# Patient Record
Sex: Male | Born: 1940
Health system: Southern US, Community
[De-identification: ages and names within clinical notes are randomized; demographics above are authoritative.]

## PROBLEM LIST (undated history)

## (undated) DIAGNOSIS — E785 Hyperlipidemia, unspecified: Secondary | ICD-10-CM

## (undated) DIAGNOSIS — I10 Essential (primary) hypertension: Secondary | ICD-10-CM

## (undated) HISTORY — DX: Hyperlipidemia, unspecified: E78.5

## (undated) HISTORY — DX: Essential (primary) hypertension: I10

---

## 2004-03-16 ENCOUNTER — Emergency Department: Payer: Self-pay | Admitting: Emergency Medicine

## 2005-01-11 ENCOUNTER — Ambulatory Visit: Payer: Self-pay | Admitting: Internal Medicine

## 2006-03-31 ENCOUNTER — Ambulatory Visit: Payer: Self-pay | Admitting: Internal Medicine

## 2006-07-25 ENCOUNTER — Ambulatory Visit: Payer: Self-pay | Admitting: Internal Medicine

## 2007-04-13 LAB — BASIC METABOLIC PANEL
BUN: 18 (ref 4–21)
CREATININE: 1 (ref 0.6–1.3)
Glucose: 75
Potassium: 3.6 (ref 3.4–5.3)
Sodium: 140 (ref 137–147)

## 2007-04-13 LAB — LIPID PANEL
CHOLESTEROL: 170 (ref 0–200)
HDL: 33 — AB (ref 35–70)
LDL Cholesterol: 60
TRIGLYCERIDES: 383 — AB (ref 40–160)

## 2007-04-13 LAB — PSA: PSA: 0.9

## 2007-04-13 LAB — HEMOGLOBIN A1C: HEMOGLOBIN A1C: 6.2

## 2008-04-21 HISTORY — PX: CATARACT EXTRACTION: SUR2

## 2009-12-18 LAB — LIPID PANEL
CHOLESTEROL: 150 (ref 0–200)
HDL: 31 — AB (ref 35–70)
LDL CALC: 46
TRIGLYCERIDES: 363 — AB (ref 40–160)

## 2009-12-18 LAB — BASIC METABOLIC PANEL
BUN: 19 (ref 4–21)
Creatinine: 0.9 (ref 0.6–1.3)
Glucose: 112
Potassium: 4 (ref 3.4–5.3)
Sodium: 142 (ref 137–147)

## 2009-12-18 LAB — HEMOGLOBIN A1C: HEMOGLOBIN A1C: 6.9

## 2009-12-18 LAB — PSA: PSA: 1.1

## 2011-03-08 HISTORY — PX: CARDIAC CATHETERIZATION: SHX172

## 2011-10-31 ENCOUNTER — Ambulatory Visit: Payer: Self-pay | Admitting: Vascular Surgery

## 2011-10-31 LAB — BASIC METABOLIC PANEL
BUN: 28 mg/dL — ABNORMAL HIGH (ref 7–18)
Creatinine: 1.05 mg/dL (ref 0.60–1.30)
EGFR (African American): >60
Glucose: 143 mg/dL — ABNORMAL HIGH (ref 65–99)
Osmolality: 291 (ref 275–301)
Sodium: 142 mmol/L (ref 136–145)

## 2011-10-31 IMAGING — XA IR VASCULAR PROCEDURE
9 series · 15 of 18 positions shown · IV contrast (IODINE)
Comparison: none

[Series 1: aortagram · 2 of 2 slices shown (1 of 9)]
[im 1/2]
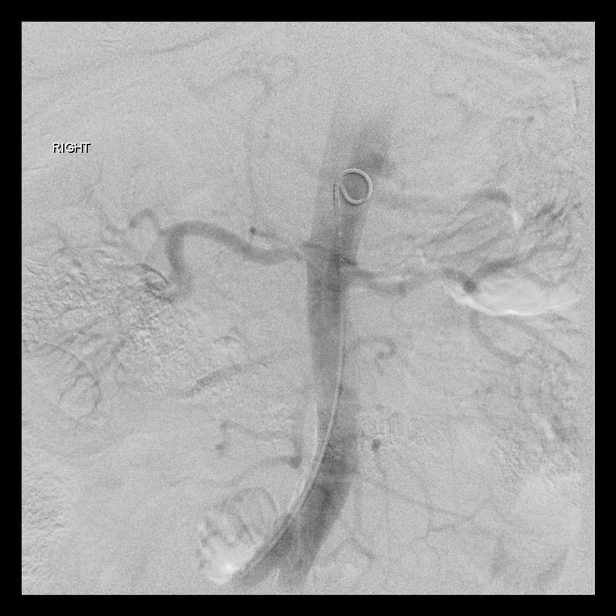
[im 2/2]
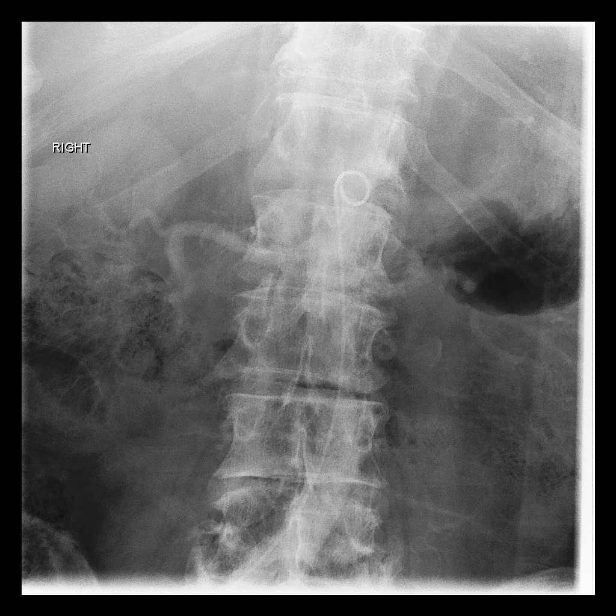

[Series 2: aortagram · 1 of 2 slices shown (2 of 9)]
[im 2/2]
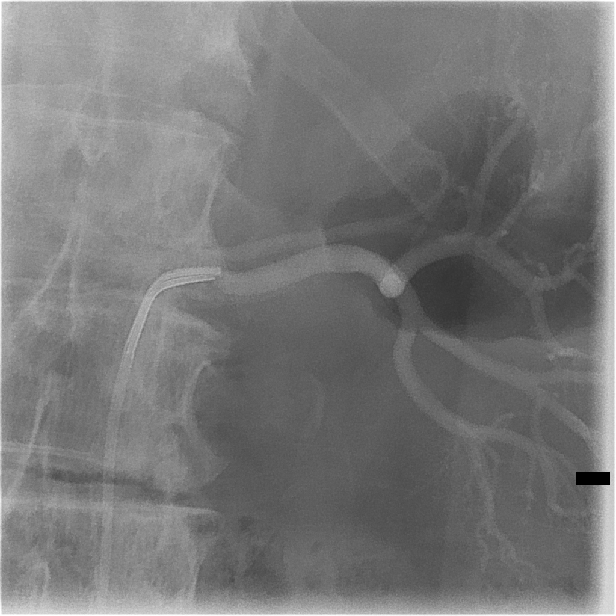

[Series 3: aortagram · 2 of 2 slices shown (3 of 9)]
[im 1/2]
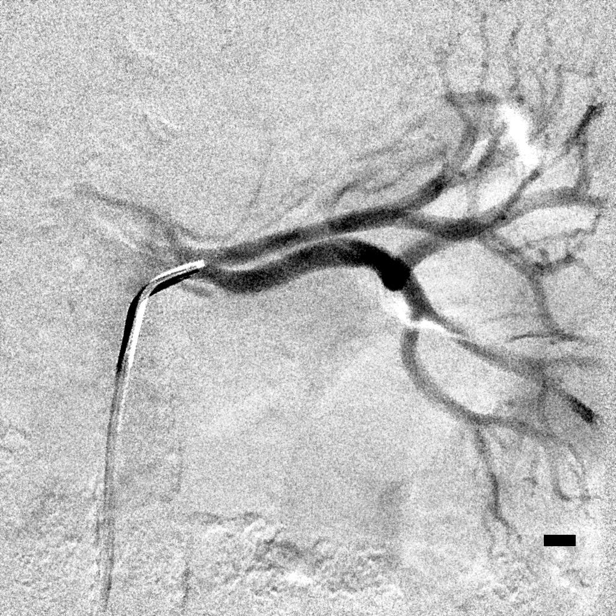
[im 2/2]
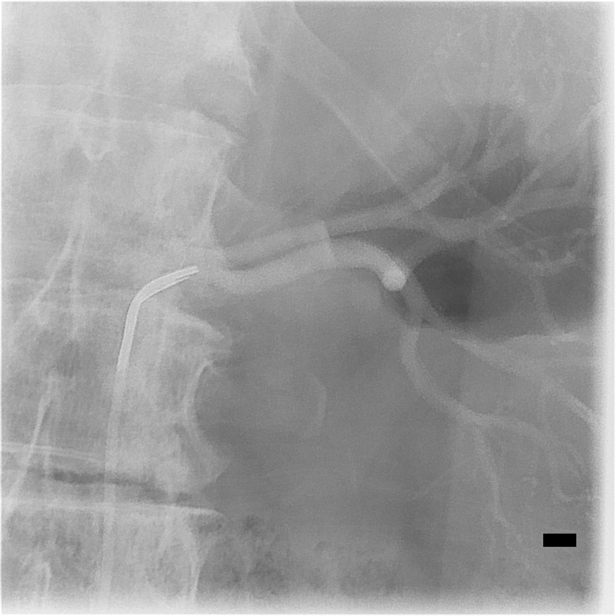

[Series 4: aortagram · 2 of 2 slices shown (4 of 9)]
[im 1/2]
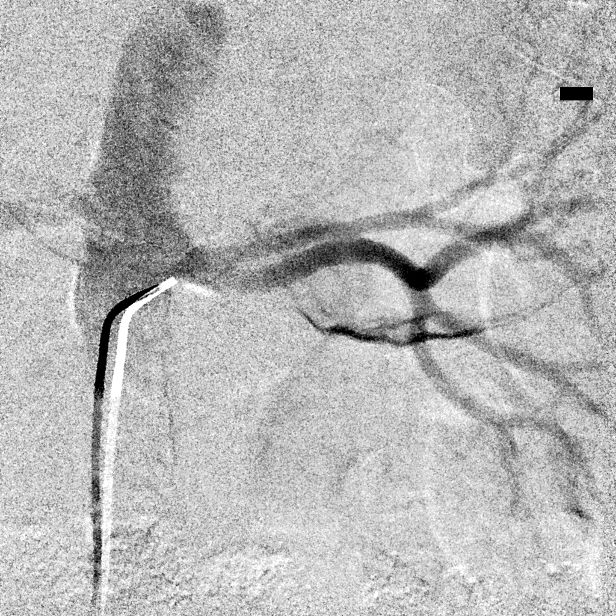
[im 2/2]
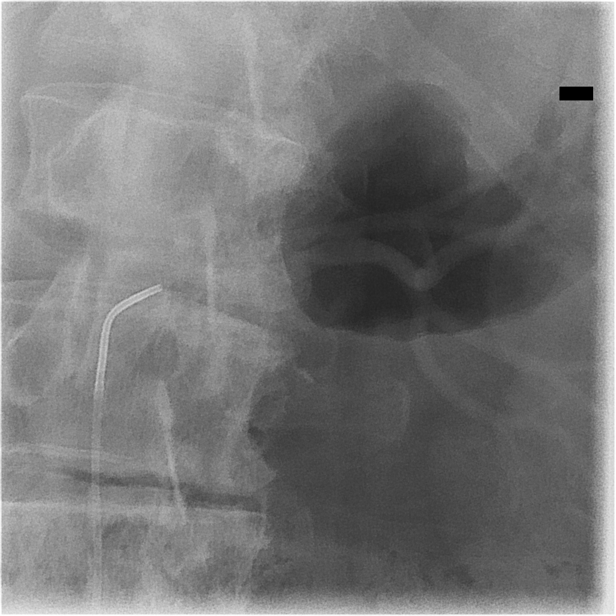

[Series 5: aortagram · 1 of 2 slices shown (5 of 9)]
[im 2/2]
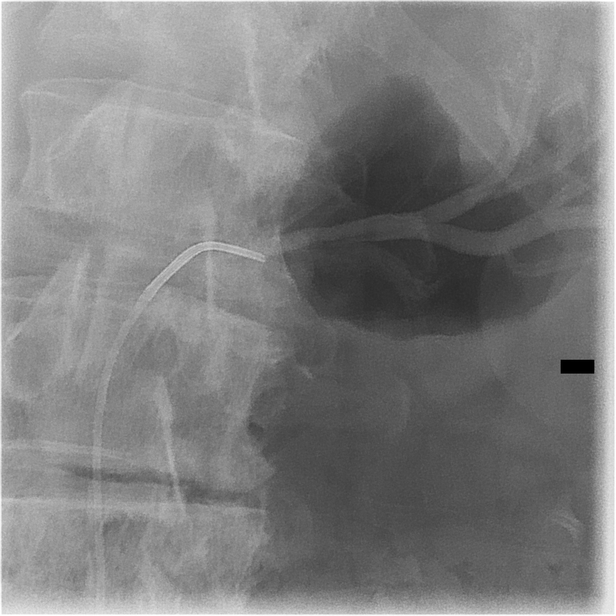

[Series 6: aortagram · 2 of 2 slices shown (6 of 9)]
[im 1/2]
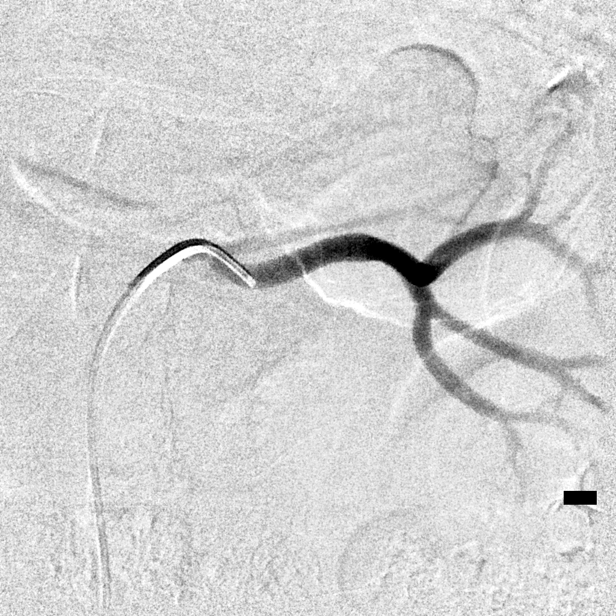
[im 2/2]
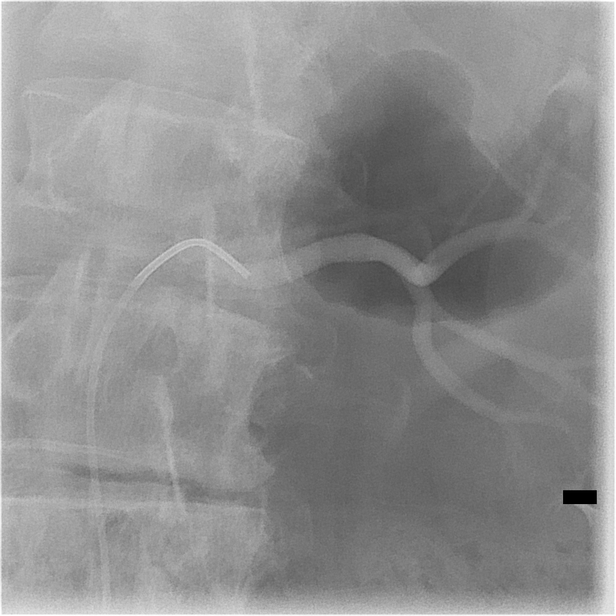

[Series 7: aortagram · 2 of 2 slices shown (7 of 9)]
[im 1/2]
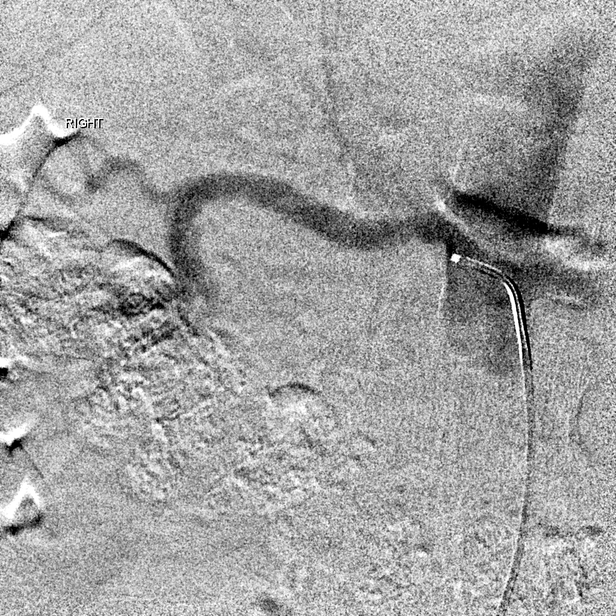
[im 2/2]
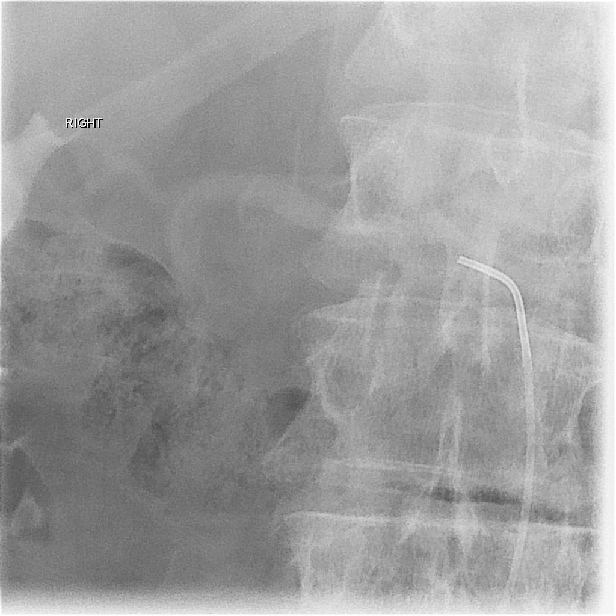

[Series 8: aortagram · 1 of 2 slices shown (8 of 9)]
[im 2/2]
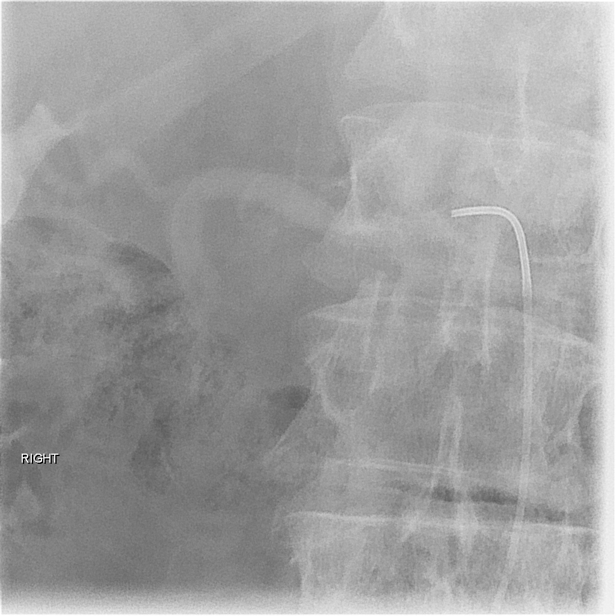

[Series 10: aortagram · 2 of 2 slices shown (9 of 9)]
[im 1/2]
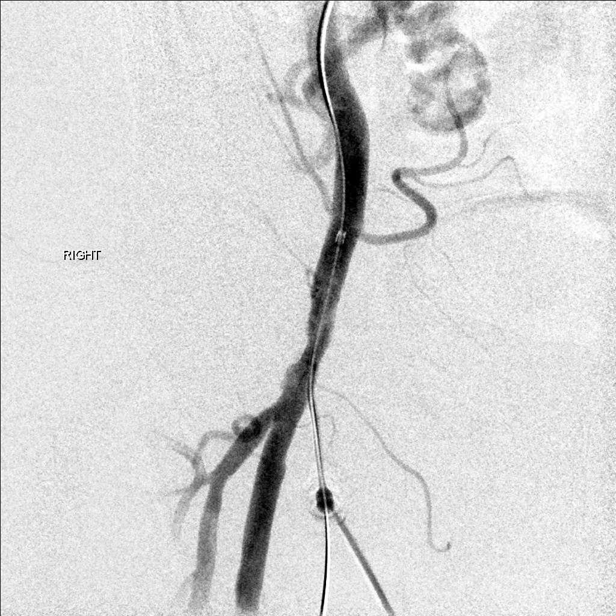
[im 2/2]
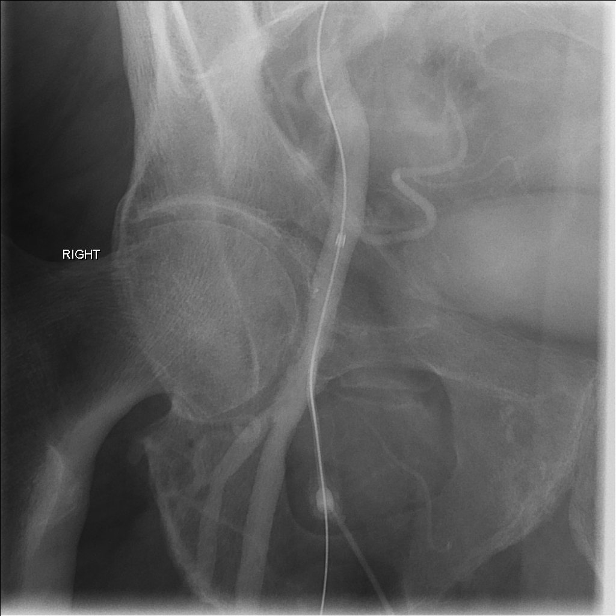

[15 of 18 positions shown; findings below may reference images not displayed]

IMAGES IMPORTED FROM THE SYNGO WORKFLOW SYSTEM
NO DICTATION FOR STUDY

## 2014-06-11 LAB — BASIC METABOLIC PANEL
BUN: 21 (ref 4–21)
CREATININE: 0.9 (ref 0.6–1.3)
GLUCOSE: 178
Potassium: 4.2 (ref 3.4–5.3)
Sodium: 142 (ref 137–147)

## 2014-06-11 LAB — LIPID PANEL
CHOLESTEROL: 174 (ref 0–200)
HDL: 27 — AB (ref 35–70)
LDL CALC: 84
TRIGLYCERIDES: 314 — AB (ref 40–160)

## 2014-06-11 LAB — TSH: TSH: 4.27 (ref 0.41–5.90)

## 2014-06-11 LAB — HEMOGLOBIN A1C: HEMOGLOBIN A1C: 7.1

## 2014-06-24 NOTE — Op Note (Signed)
PATIENT NAME:  Robert Daniel, Robert Daniel MR#:  [id number] DATE OF BIRTH:  September 26, 1940  DATE OF PROCEDURE:  10/31/2011  PREOPERATIVE DIAGNOSES:  1. Severe hypertension.  2. Renal artery stenosis.  3. Diabetes.  POSTOPERATIVE DIAGNOSES:  1. Severe hypertension.  2. Renal artery stenosis.  3. Diabetes.   PROCEDURES:   1. Ultrasound guidance for vascular access, right femoral artery.  2. Catheter placement into bilateral renal arteries from right femoral approach.  3. Aortogram and selective renal angiogram.  4. Pressure measurements of left renal artery.  5. StarClose closure device, right femoral artery.   SURGEON: Algernon Huxley, M.D.   ANESTHESIA: Local with moderate conscious sedation.   ESTIMATED BLOOD LOSS: 25 mL. CONTRAST USED: 50 mL Visipaque.  FLUOROSCOPY TIME: Approximately three minutes.   INDICATION FOR PROCEDURE: This is a 74 year old white male with very poorly controlled hypertension, on multiple medications. A noninvasive study did not suggest high-grade stenosis, but some degree of renal artery stenosis was suspected and in a discussion with the patient we decided to perform an angiogram for further evaluation and potential treatment if high-grade stenosis was seen. The risks and benefits were discussed. Informed consent was obtained.   DESCRIPTION OF PROCEDURE: The patient was brought to the vascular interventional radiology suite. Groins were shaved and prepped and a sterile surgical field was created. The right femoral head was localized with fluoroscopy. Due to poor anatomic landmarks, ultrasound was used to access the right femoral artery. This was done under direct ultrasound guidance without difficulty with a Seldinger needle. A J-wire and 5 French sheath were placed. Pigtail catheter was placed in the aorta at the L1 level and AP aortogram was performed and this showed the origins of the renal vessels. C2 catheter was used to cannulate both renal arteries on both the  selective imaging and the aortogram. No high grade renal artery stenosis was suggested. The right renal artery had a very mild stenosis that appeared to be less than 20%. The left renal artery had a very early bifurcation and had some degree of renal artery stenosis in the larger of the two renal arteries. This appeared to be in the 30% to 40% range. To further evaluate this, when I selectively cannulated the left renal artery after imaging it, pressure measurements were performed from beyond the stenosis in the main left renal artery back into the aorta and no gradient of significance was seen. After we had evaluated both renal arteries, I elected to terminate the procedure. The diagnostic catheter was removed. Oblique arteriogram was performed in the right femoral artery and StarClose closure device was deployed in the usual fashion with a good hemostatic result. The patient had developed some hematoma around the sheath likely due to his severe hypertension and size during the onset of the procedure, but this had not enlarged. Several minutes of extra pressure were held after closure device deployment. The patient tolerated the procedure well and was taken to the recovery room in stable condition.   ____________________________ Algernon Huxley, MD jsd:ap D: 10/31/2011 11:03:08 ET T: 10/31/2011 12:15:49 ET JOB#: [id number]  cc: Algernon Huxley, MD, <Dictator> Mikeal Hawthorne. Brynda Greathouse, MD Algernon Huxley MD ELECTRONICALLY SIGNED 11/09/2011 10:00

## 2015-05-12 DIAGNOSIS — I119 Hypertensive heart disease without heart failure: Secondary | ICD-10-CM | POA: Diagnosis not present

## 2015-05-12 DIAGNOSIS — E119 Type 2 diabetes mellitus without complications: Secondary | ICD-10-CM | POA: Diagnosis not present

## 2015-05-12 DIAGNOSIS — E785 Hyperlipidemia, unspecified: Secondary | ICD-10-CM | POA: Diagnosis not present

## 2015-10-28 DIAGNOSIS — R69 Illness, unspecified: Secondary | ICD-10-CM | POA: Diagnosis not present

## 2015-11-10 DIAGNOSIS — I119 Hypertensive heart disease without heart failure: Secondary | ICD-10-CM | POA: Diagnosis not present

## 2015-11-10 DIAGNOSIS — E119 Type 2 diabetes mellitus without complications: Secondary | ICD-10-CM | POA: Diagnosis not present

## 2015-12-02 DIAGNOSIS — E113393 Type 2 diabetes mellitus with moderate nonproliferative diabetic retinopathy without macular edema, bilateral: Secondary | ICD-10-CM | POA: Diagnosis not present

## 2016-04-05 DIAGNOSIS — E785 Hyperlipidemia, unspecified: Secondary | ICD-10-CM | POA: Diagnosis not present

## 2016-04-05 DIAGNOSIS — I119 Hypertensive heart disease without heart failure: Secondary | ICD-10-CM | POA: Diagnosis not present

## 2016-04-05 DIAGNOSIS — E119 Type 2 diabetes mellitus without complications: Secondary | ICD-10-CM | POA: Diagnosis not present

## 2016-04-05 LAB — LIPID PANEL
CHOLESTEROL: 218 — AB (ref 0–200)
HDL: 33 — AB (ref 35–70)
LDL Cholesterol: 134
Triglycerides: 255 — AB (ref 40–160)

## 2016-04-05 LAB — BASIC METABOLIC PANEL
BUN: 27 — AB (ref 4–21)
CREATININE: 1.1 (ref 0.6–1.3)
Glucose: 176
POTASSIUM: 4.2 (ref 3.4–5.3)
Sodium: 143 (ref 137–147)

## 2016-04-05 LAB — HEMOGLOBIN A1C: HEMOGLOBIN A1C: 7

## 2016-08-23 DIAGNOSIS — Z794 Long term (current) use of insulin: Secondary | ICD-10-CM | POA: Diagnosis not present

## 2016-08-23 DIAGNOSIS — E119 Type 2 diabetes mellitus without complications: Secondary | ICD-10-CM | POA: Diagnosis not present

## 2016-08-23 DIAGNOSIS — I119 Hypertensive heart disease without heart failure: Secondary | ICD-10-CM | POA: Diagnosis not present

## 2016-08-23 LAB — BASIC METABOLIC PANEL
BUN / CREAT RATIO: 25 — AB (ref 10–24)
BUN: 27 — AB (ref 4–21)
CHLORIDE: 103
Calcium: 9.8
Carbon Dioxide, Total: 19 — AB (ref 20–29)
Creat: 1.07
GFR CALC NON AF AMER: 67
Glucose: 210
POTASSIUM: 4.2
Sodium: 140

## 2016-08-23 LAB — LIPID PANEL
Cholesterol: 199 (ref 0–200)
HDL: 31 — AB (ref 35–70)
LDL CALC: 95
Triglycerides: 367 — AB (ref 40–160)

## 2016-08-23 LAB — HEMOGLOBIN A1C: HEMOGLOBIN A1C: 7.6

## 2016-11-21 DIAGNOSIS — R69 Illness, unspecified: Secondary | ICD-10-CM | POA: Diagnosis not present

## 2016-12-06 DIAGNOSIS — E113393 Type 2 diabetes mellitus with moderate nonproliferative diabetic retinopathy without macular edema, bilateral: Secondary | ICD-10-CM | POA: Diagnosis not present

## 2016-12-06 LAB — HM DIABETES EYE EXAM

## 2016-12-07 ENCOUNTER — Encounter: Payer: Self-pay | Admitting: Family Medicine

## 2016-12-20 ENCOUNTER — Ambulatory Visit (INDEPENDENT_AMBULATORY_CARE_PROVIDER_SITE_OTHER): Payer: Medicare HMO | Admitting: Family Medicine

## 2016-12-20 ENCOUNTER — Encounter: Payer: Self-pay | Admitting: Family Medicine

## 2016-12-20 VITALS — BP 244/108 | HR 76 | Temp 98.4°F | Resp 16 | Ht 75.0 in | Wt 290.0 lb

## 2016-12-20 DIAGNOSIS — I1A0 Resistant hypertension: Secondary | ICD-10-CM | POA: Insufficient documentation

## 2016-12-20 DIAGNOSIS — Z7689 Persons encountering health services in other specified circumstances: Secondary | ICD-10-CM | POA: Diagnosis not present

## 2016-12-20 DIAGNOSIS — E119 Type 2 diabetes mellitus without complications: Secondary | ICD-10-CM | POA: Diagnosis not present

## 2016-12-20 DIAGNOSIS — I10 Essential (primary) hypertension: Secondary | ICD-10-CM

## 2016-12-20 DIAGNOSIS — E1121 Type 2 diabetes mellitus with diabetic nephropathy: Secondary | ICD-10-CM | POA: Insufficient documentation

## 2016-12-20 DIAGNOSIS — I517 Cardiomegaly: Secondary | ICD-10-CM | POA: Diagnosis not present

## 2016-12-20 NOTE — Progress Notes (Signed)
Subjective:    Patient ID: Robert Daniel, male    DOB: [birth year], 76 y.o.   MRN: [id number]  Robert Daniel is a 76 y.o. male presenting on 12/20/2016 for Establish Care (blood pressure 150/90 normal for patient as per previous PCP)  Here to establish care, previous PCP Dr Brynda Greathouse, who has retired. Last office visit was >3 months ago.  HPI   Specialists: - Dermatology - Dr Nehemiah Massed ([place]) - Chronic Rash Lower Extremity, thought to be secondary to allergy from beta blocker and/or amlodipine - Dr Lorie Apley ([place])  Previous specialists: - Vascular Surgery - Dr Leotis Pain - Cardiac Catheterization, reportedly 40% blockage but do not have rest of report - Nephrology - Dr Lavonia Dana (2013 [facility]) - had work-up for potential resistant hypertension, reportedly unremarkable testing based on PCP records with negative renal arteriogram no evidence of RAS  CHRONIC HTN / Hypertensive Urgency Reports a long history of known white coat hypertension with elevated BP at doctors office, usually it improves upon re-check, he does check it at home occasionally, and states previously goal was around 150/90 or less, he has had dramatic elevations in past as well. He is concerned about BP elevated today, and admits he is very anxious, see below - Chart review shows last visit 08/2016 with BP 220/110 and 11/2015 had BP 190/88, and further review long history of uncontrolled HTN with SBP 190-220 often, he was initiated on Hydralazine back in 2016 with dose titration  Current Meds - Benazepril 15m daily, Clonidine 0.233mTID, Hydralazine 10041mID - History of intolerance or side effects on Bystolic and Amlodipine 44m72CNlast rx 2013, has history of leg rash but seems chronic, unable to provide clear explanation, but chart review shows Amlodipne caused LE edema and discoloration  - Also failed Metoprolol, HCTZ, Diovan  Reports good compliance, took meds today.  Tolerating well, w/o complaints. Lifestyle: - Diet: tries to limit carbs for DM, low salt - Exercise: [employer], cycling, treadmill - Additional history has rx PRN Torsemide 100m22mor edema, rarely uses, takes Klor-Con with it PRN  CHRONIC DM, Type 2: Reports no concerns, last A1c reviewed from outside office >3 months ago Meds: Metformin 1000mg70m, Glimepiride 4mg x73mfor 8mg da44m Reports good compliance. Tolerating well w/o side-effects Currently on ACEi  History of HLD - on Gemfibrozil 600mg BI35md Lovastatin 20mg dai37mHealth Maintenance: - UTD Flu Shot - UTD on Pneumonia vaccines, x 2 since age >65, do n>75have full details on vaccination dose etc. Based off of prior medical records reviewed - S/p Zostavax 10/2012 - UTD TDap last 10/27/2010 - [facility] [place] of colonoscopy reportedly completed in 2017, without record available  No flowsheet data found.  Depression screen PHQ 2/9 12/20/2016  Decreased Interest 0  Down, Depressed, Hopeless 0  PHQ - 2 Score 0    Past Medical History:  Diagnosis Date  . Diabetes mellitus without complication (HCC)   . Wall Laneertension    Past Surgical History:  Procedure Laterality Date  . CARDIAC CATHETERIZATION  2013  . CATARACT EXTRACTION Bilateral 04/21/2008   R eye 05-12-08   Social History   Social History  . Marital status: Divorced    Spouse name: N/A  . Number of children: N/A  . Years of education: Graduate   Occupational History  . Not on file.   Social History Main Topics  . Smoking status: Never Smoker  . Smokeless tobacco:  Never Used  . Alcohol use No  . Drug use: No  . Sexual activity: Not on file   Other Topics Concern  . Not on file   Social History Narrative  . No narrative on file   History reviewed. No pertinent family history. No current outpatient prescriptions on file prior to visit.   No current facility-administered medications on file prior to visit.     Review of  Systems  Constitutional: Negative for activity change, appetite change, chills, diaphoresis, fatigue, fever and unexpected weight change.  HENT: Negative for congestion, hearing loss and sinus pressure.   Eyes: Negative for visual disturbance.  Respiratory: Negative for cough, chest tightness, shortness of breath and wheezing.   Cardiovascular: Negative for chest pain, palpitations and leg swelling.  Gastrointestinal: Negative for abdominal pain, anal bleeding, blood in stool, constipation, diarrhea, nausea and vomiting.  Endocrine: Negative for cold intolerance and polyuria.  Genitourinary: Negative for decreased urine volume, dysuria, frequency and hematuria.  Musculoskeletal: Negative for arthralgias, back pain and neck pain.  Skin: Negative for rash.  Allergic/Immunologic: Negative for environmental allergies.  Neurological: Negative for dizziness, weakness, light-headedness, numbness and headaches.  Hematological: Negative for adenopathy.  Psychiatric/Behavioral: Negative for behavioral problems, dysphoric mood and sleep disturbance. The patient is nervous/anxious.    Per HPI unless specifically indicated above     Objective:    BP (!) 244/108 (BP Location: Left Arm, Patient Position: Sitting, Cuff Size: Large)   Pulse 76   Temp 98.4 F (36.9 C) (Oral)   Resp 16   Ht '6\' 3"'  (1.905 m)   Wt 290 lb (131.5 kg)   BMI 36.25 kg/m   Wt Readings from Last 3 Encounters:  12/20/16 290 lb (131.5 kg)    Physical Exam  Constitutional: He is oriented to person, place, and time. He appears well-developed and well-nourished. No distress.  Well-appearing, comfortable, cooperative, obese  HENT:  Head: Normocephalic and atraumatic.  Mouth/Throat: Oropharynx is clear and moist.  Eyes: Conjunctivae are normal. Right eye exhibits no discharge. Left eye exhibits no discharge.  Neck: Normal range of motion. Neck supple. No thyromegaly present.  Cardiovascular: Normal rate, regular rhythm, normal  heart sounds and intact distal pulses.   No murmur heard. Pulmonary/Chest: Effort normal and breath sounds normal. No respiratory distress. He has no wheezes. He has no rales.  Musculoskeletal: Normal range of motion. He exhibits no edema.  Upper / Lower Extremities: - Normal muscle tone, strength bilateral upper extremities 5/5, lower extremities 5/5  Normal gait  Lymphadenopathy:    He has no cervical adenopathy.  Neurological: He is alert and oriented to person, place, and time. No cranial nerve deficit.  Distal sensation to light touch intact  Skin: Skin is warm and dry. No rash noted. He is not diaphoretic. No erythema.  Psychiatric: He has a normal mood and affect. His behavior is normal.  Well groomed, good eye contact, normal speech and thoughts  Nursing note and vitals reviewed.   EKG - performed in office today  Date: 12/20/16  Rate: 80  Rhythm: normal sinus rhythm with PVC  QRS Axis: normal  Intervals: normal  ST/T Wave abnormalities: nonspecific ST/T changes  Conduction Disutrbances:none  Additional Narrative Interpretation: Left ventricular hypertrophy  Old EKG Reviewed: unchanged - 2001, 1996, from prior PCP records  Results for orders placed or performed in visit on 48/18/59  Basic metabolic panel  Result Value Ref Range   Glucose 210    BUN 27 (A) 4 - 21  Creat 1.07    EGFR (Non-African Amer.) 67    BUN/Creatinine Ratio 25 (A) 10 - 24   Sodium 140    Potassium 4.2    Chloride 103    Carbon Dioxide, Total 19 (A) 20 - 29   Calcium 9.8   Hemoglobin A1c  Result Value Ref Range   Hemoglobin A1C 7.6   Lipid panel  Result Value Ref Range   Triglycerides 367 (A) 40 - 160   Cholesterol 199 0 - 200   HDL 31 (A) 35 - 70   LDL Cholesterol 95   Basic metabolic panel  Result Value Ref Range   Glucose 176    BUN 27 (A) 4 - 21   Creatinine 1.1 0.6 - 1.3   Potassium 4.2 3.4 - 5.3   Sodium 143 137 - 147  Lipid panel  Result Value Ref Range   Triglycerides 255  (A) 40 - 160   Cholesterol 218 (A) 0 - 200   HDL 33 (A) 35 - 70   LDL Cholesterol 134   Hemoglobin A1c  Result Value Ref Range   Hemoglobin A1C 7.0       Assessment & Plan:   Problem List Items Addressed This Visit    Controlled type 2 diabetes mellitus without complication ([place])    Well-controlled DM with A1c 7-3.5 No known complications or hypoglycemia.  Plan:  1. Continue current therapy - Metformin 1065m BID, Glimepiride 852mdaily 2. Encourage improved lifestyle - low carb, low sugar diet, reduce portion size, continue improving regular exercise 3. Check CBG, bring log to next visit for review 4. Continue ASA, ACEi, Statin 5. Due for future DM Foot exam 6. Follow-up 3 months A1c      Relevant Medications   benazepril (LOTENSIN) 20 MG tablet   glimepiride (AMARYL) 4 MG tablet   lovastatin (MEVACOR) 20 MG tablet   metFORMIN (GLUCOPHAGE) 1000 MG tablet   aspirin EC 81 MG tablet   Resistant hypertension - Primary    Asymptomatic, despite significantly uncontrolled BP. Today elevated SBP 220-240 on multiple manual and electronic re-checks over course of visit. - PCP records reviewed, with chronic history resistant HTN >20 years, with avg SBP 180-200 - Home BP readings reportedly 14329-924 No known complications - Prior work-up done by Vascular and Renal with negative secondary HTN work-up, negative RAS - Failed: Bystolic, Metoprolol, HCTZ, Diovan  Plan:  1. Continue current BP regimen today due to chronicity of problem - and various med changes over years - continue Benazepril 2056mClonidine 0.2mg67mD, Hydralazine 100mg62m 2. Encourage improved lifestyle - low sodium diet, regular exercise 3. Continue monitor BP outside office, bring readings to next visit for review 4. Discussion on future follow-up plans, patient is adamant that his BP is always elevated in doctors office, and he is asymptomatic with unremarkable exam, worse with anxiety and admits white coat HTN,  emphasized that given his elevated BP and since first visit with him, I would offer to have him be evaluated in the hospital ED for more immediate attention, labs, med management and work-up. He declines states this is "not new" and again attributes it to anxiety - Performed EKG in office, essentially unchanged from EKG 17 years ago, concern with identified LVH on prior EKGs as well - [facility] work-up of resistant HTN and pursue ECHO among other stress test as indicated, concern with LVH  Other possible secondary causes of HTN without  evidence of prior work-up, unless further in old records, could be OSA and possible endocrine etiology (record review TSH >6 few years ago), upon follow-up with discuss these options as well  Future labs ordered for 3 months with CMET, TSH, Free T4, CBC, PRA + PRC Aldosterone  Strict return precautions given to patient when to go to hospital ED if worsening symptoms      Relevant Medications   benazepril (LOTENSIN) 20 MG tablet   cloNIDine (CATAPRES) 0.2 MG tablet   gemfibrozil (LOPID) 600 MG tablet   hydrALAZINE (APRESOLINE) 100 MG tablet   lovastatin (MEVACOR) 20 MG tablet   torsemide (DEMADEX) 100 MG tablet   aspirin EC 81 MG tablet   Other Relevant Orders   Ambulatory referral to Cardiology   White coat syndrome with hypertension    See A&P HTN Concern situational anxiety is related However seems to be severe reaction with BP >200 on average in office Patient asking about starting anxiety med, and advised not comfortable new start today with this BP and without review of his complete medical record, awaiting      Relevant Medications   benazepril (LOTENSIN) 20 MG tablet   cloNIDine (CATAPRES) 0.2 MG tablet   gemfibrozil (LOPID) 600 MG tablet   hydrALAZINE (APRESOLINE) 100 MG tablet   lovastatin (MEVACOR) 20 MG tablet   torsemide (DEMADEX) 100 MG tablet   aspirin EC 81 MG tablet    Other Visit  Diagnoses    Encounter to establish care with new doctor       LVH (left ventricular hypertrophy)       Relevant Medications   benazepril (LOTENSIN) 20 MG tablet   cloNIDine (CATAPRES) 0.2 MG tablet   gemfibrozil (LOPID) 600 MG tablet   hydrALAZINE (APRESOLINE) 100 MG tablet   lovastatin (MEVACOR) 20 MG tablet   torsemide (DEMADEX) 100 MG tablet   aspirin EC 81 MG tablet   Other Relevant Orders   Ambulatory referral to Cardiology      Meds ordered this encounter  Medications  . benazepril (LOTENSIN) 20 MG tablet  . cloNIDine (CATAPRES) 0.2 MG tablet  . gemfibrozil (LOPID) 600 MG tablet  . glimepiride (AMARYL) 4 MG tablet  . hydrALAZINE (APRESOLINE) 100 MG tablet  . FLUZONE HIGH-DOSE 0.5 ML injection  . lovastatin (MEVACOR) 20 MG tablet  . metFORMIN (GLUCOPHAGE) 1000 MG tablet  . triamcinolone cream (KENALOG) 0.1 %  . torsemide (DEMADEX) 100 MG tablet    Sig: Take 100 mg by mouth as needed.  . potassium chloride (KLOR-CON) 20 MEQ packet    Sig: Take by mouth as needed.  Marland Kitchen aspirin EC 81 MG tablet    Sig: Take 81 mg by mouth daily.  . Cyanocobalamin (VITAMIN B-12) 5000 MCG TBDP    Sig: Take by mouth.  . calcium citrate-vitamin D (CALCIUM + D) 315-200 MG-UNIT tablet    Sig: Take 1 tablet by mouth once.  Marland Kitchen co-enzyme Q-10 30 MG capsule    Sig: Take 30 mg by mouth 3 (three) times daily.  . Melatonin 3-2 MG TABS    Sig: Take by mouth.  . Ascorbic Acid (VITAMIN C) 1000 MG tablet    Sig: Take 1,000 mg by mouth daily.  . Multiple Vitamin (MULTIVITAMIN) tablet    Sig: Take 1 tablet by mouth daily.    Follow up plan: Return in about 3 months (around 03/22/2017) for HTN, DM.  Nobie Putnam, [facility] 12/21/2016, 2:04 AM

## 2016-12-20 NOTE — Patient Instructions (Addendum)
Thank you for coming to the clinic today.  1. I am concerned about significantly elevated blood pressure.  I understand you are anxious but I am still concerned. I feel better that you do not have any symptoms.  Recommended EKG today and if further elevated BP at home >180 or any new symptoms chest pain, headache, difficulty breathing, or other concerns then I recommend going to the hospital Emergency Department  EKG essentially unchanged from >20 years ago  [facility]) [facility] [address] [place], [place] [address] Main: [phone]  Stay tuned for appointment from Cardiologist within 1-2 weeks, if you don't hear back, then please call them to check  We are interested to do more testing on the heart especially ECHOcardiogram or stress test given Increased Ventricular Hypertrophy (enlarged left ventricular)  DUE for NON FASTING BLOOD WORK (no food or drink after midnight before the lab appointment, only water or coffee without cream/sugar on the morning of)  SCHEDULE "Lab Only" visit in the morning at the clinic for lab draw in 3 MONTHS  - Make sure Lab Only appointment is at about 1 week before your next appointment, so that results will be available  For Lab Results, once available within 2-3 days of blood draw, you can can log in to MyChart online to view your results and a brief explanation. Also, we can discuss results at next follow-up visit.  Please schedule a Follow-up Appointment to: Return in about 3 months (around 03/22/2017) for HTN, DM.  If you have any other questions or concerns, please feel free to call the clinic or send a message through [facility]. You may also schedule an earlier appointment if necessary.  Additionally, you may be receiving a survey about your experience at our clinic within a few days to 1 week by e-mail or mail. We value your feedback.  Nobie Putnam, DO [place]

## 2016-12-21 ENCOUNTER — Encounter: Payer: Self-pay | Admitting: Family Medicine

## 2016-12-21 ENCOUNTER — Other Ambulatory Visit: Payer: Self-pay | Admitting: Family Medicine

## 2016-12-21 DIAGNOSIS — I10 Essential (primary) hypertension: Secondary | ICD-10-CM

## 2016-12-21 DIAGNOSIS — E119 Type 2 diabetes mellitus without complications: Secondary | ICD-10-CM

## 2016-12-21 DIAGNOSIS — Z125 Encounter for screening for malignant neoplasm of prostate: Secondary | ICD-10-CM

## 2016-12-21 NOTE — Assessment & Plan Note (Signed)
Well-controlled DM with A1c 1-6.0 No known complications or hypoglycemia.  Plan:  1. Continue current therapy - Metformin 1000mg  BID, Glimepiride 8mg  daily 2. Encourage improved lifestyle - low carb, low sugar diet, reduce portion size, continue improving regular exercise 3. Check CBG, bring log to next visit for review 4. Continue ASA, ACEi, Statin 5. Due for future DM Foot exam 6. Follow-up 3 months A1c

## 2016-12-21 NOTE — Addendum Note (Signed)
Addended by: Frederich Cha D on: 12/21/2016 12:04 PM   Modules accepted: Orders

## 2016-12-21 NOTE — Assessment & Plan Note (Addendum)
Asymptomatic, despite significantly uncontrolled BP. Today elevated SBP 220-240 on multiple manual and electronic re-checks over course of visit. - PCP records reviewed, with chronic history resistant HTN >20 years, with avg SBP 180-200 - Home BP readings reportedly [id number] - No known complications - Prior work-up done by Vascular and Renal with negative secondary HTN work-up, negative RAS - Failed: Bystolic, Metoprolol, HCTZ, Diovan  Plan:  1. Continue current BP regimen today due to chronicity of problem - and various med changes over years - continue Benazepril 20mg , Clonidine 0.2mg  TID, Hydralazine 100mg  BID 2. Encourage improved lifestyle - low sodium diet, regular exercise 3. Continue monitor BP outside office, bring readings to next visit for review 4. Discussion on future follow-up plans, patient is adamant that his BP is always elevated in doctors office, and he is asymptomatic with unremarkable exam, worse with anxiety and admits white coat HTN, emphasized that given his elevated BP and since first visit with him, I would offer to have him be evaluated in the hospital ED for more immediate attention, labs, med management and work-up. He declines states this is "not new" and again attributes it to anxiety - Performed EKG in office, essentially unchanged from EKG 17 years ago, concern with identified LVH on prior EKGs as well - Agree to proceed with referral to [facility] for further work-up of resistant HTN and pursue ECHO among other stress test as indicated, concern with LVH  Other possible secondary causes of HTN without evidence of prior work-up, unless further in old records, could be OSA and possible endocrine etiology (record review TSH >6 few years ago), upon follow-up with discuss these options as well  Future labs ordered for 3 months with CMET, TSH, Free T4, CBC, PRA + PRC Aldosterone  Strict return precautions given to patient when to go to hospital ED if worsening  symptoms

## 2016-12-21 NOTE — Assessment & Plan Note (Addendum)
See A&P HTN Concern situational anxiety is related However seems to be severe reaction with BP >200 on average in office Patient asking about starting anxiety med, and advised not comfortable new start today with this BP and without review of his complete medical record, awaiting

## 2016-12-22 ENCOUNTER — Ambulatory Visit: Payer: Self-pay | Admitting: Cardiovascular Disease

## 2016-12-27 ENCOUNTER — Encounter: Payer: Self-pay | Admitting: Family Medicine

## 2017-01-11 ENCOUNTER — Other Ambulatory Visit: Payer: Self-pay

## 2017-01-11 DIAGNOSIS — I10 Essential (primary) hypertension: Secondary | ICD-10-CM

## 2017-01-11 MED ORDER — BENAZEPRIL HCL 20 MG PO TABS: 20.0000 mg | ORAL_TABLET | Freq: Every day | ORAL | 1 refills | Status: DC

## 2017-02-16 DIAGNOSIS — E785 Hyperlipidemia, unspecified: Secondary | ICD-10-CM

## 2017-02-16 DIAGNOSIS — E1169 Type 2 diabetes mellitus with other specified complication: Secondary | ICD-10-CM | POA: Insufficient documentation

## 2017-02-16 HISTORY — DX: Hyperlipidemia, unspecified: E78.5

## 2017-02-16 NOTE — Progress Notes (Signed)
Cardiology Office Note  Date:  02/17/2017   ID:  Robert Daniel, DOB 02/13/1941, MRN [id number]  PCP:  Olin Hauser, DO   Chief Complaint  Patient presents with  . other    Hypertension and LVH. Meds reviewed verbally with pt.    HPI:  Robert Daniel is a pleasant 76 year old gentleman with past medical history of DM2, HBA1C 7.0 Hyperlipidemia Cardiac cath 2013 nonsmoker Labile hypertension/ white coat hypertension  Who presents by referral from Dr. Haze Justin for consultation of his hypertension  At home he reports blood pressure typically runs 782 up to 956 systolic on a regular basis Checks his blood pressure several times per day When seen in doctor's offices, systolic pressure typically in the 200s Todays no exception, blood pressure on arrival 220/100 On my recheck blood pressure 190/110 left arm This was also confirmed on his automatic blood pressure cuff which he brings with him today  Reports compliance with his medications,  takes hydralazine 100 mg 6 AM and 6 PM Was clonidine 0.2 mg 3 times daily 6 AM, 6 PM, midnight Benazepril 20 mg in the morning  Other medical team includes Vascular Surgery - Dr Leotis Pain - Cardiac Catheterization, reportedly 40% blockage but do not have rest of report - Nephrology - Dr Lavonia Dana (2013 [facility]) - had work-up for potential resistant hypertension, reportedly unremarkable testing based on PCP records with negative renal arteriogram no evidence of RAS  Previous problems on amlodipine which cause leg edema, Notes indicating he failed Metoprolol, HCTZ, Diovan  Reports having episodes of shortness of breath when blood pressure runs high, often associated with anxiety At baseline does not have chest pain or shortness of breath, able to walk around [place] without any significant symptoms  EKG personally reviewed by myself on todays visit Shows normal sinus rhythm rate 84 bpm nonspecific ST abnormality V5, V6, 1 and  aVL  PMH:   has a past medical history of Diabetes mellitus without complication ([place]) and Hypertension.  PSH:    Past Surgical History:  Procedure Laterality Date  . CARDIAC CATHETERIZATION  2013  . CATARACT EXTRACTION Bilateral 04/21/2008   R eye 05-12-08    Current Outpatient Medications  Medication Sig Dispense Refill  . Ascorbic Acid (VITAMIN C) 1000 MG tablet Take 1,000 mg by mouth daily.    Marland Kitchen aspirin EC 81 MG tablet Take 81 mg by mouth daily.    . benazepril (LOTENSIN) 40 MG tablet Take 1 tablet (40 mg total) by mouth daily. 90 tablet 3  . calcium citrate-vitamin D (CALCIUM + D) 315-200 MG-UNIT tablet Take 1 tablet by mouth once.    . cloNIDine (CATAPRES) 0.2 MG tablet Take 0.2 mg by mouth 3 (three) times daily.     Marland Kitchen co-enzyme Q-10 30 MG capsule Take 30 mg by mouth 3 (three) times daily.    . Cyanocobalamin (VITAMIN B-12) 5000 MCG TBDP Take by mouth.    Marland Kitchen FLUZONE HIGH-DOSE 0.5 ML injection     . gemfibrozil (LOPID) 600 MG tablet Take 600 mg by mouth 2 (two) times daily before a meal.     . glimepiride (AMARYL) 4 MG tablet Take 4 mg by mouth 2 (two) times daily.     . hydrALAZINE (APRESOLINE) 100 MG tablet Take 1 tablet (100 mg total) by mouth 4 (four) times daily. 360 tablet 3  . lovastatin (MEVACOR) 20 MG tablet Take 20 mg by mouth at bedtime.     . Melatonin 3-2 MG TABS Take by mouth.    Marland Kitchen  metFORMIN (GLUCOPHAGE) 1000 MG tablet Take 1,000 mg by mouth 2 (two) times daily with a meal.     . Multiple Vitamin (MULTIVITAMIN) tablet Take 1 tablet by mouth daily.    . potassium chloride (KLOR-CON) 20 MEQ packet Take by mouth as needed.    . torsemide (DEMADEX) 100 MG tablet Take 100 mg by mouth as needed.    . triamcinolone cream (KENALOG) 0.1 % Apply 1 application topically 2 (two) times daily.     . hydrALAZINE (APRESOLINE) 100 MG tablet Take 1 tablet (100 mg total) by mouth 3 (three) times daily. 90 tablet 2  . nitroGLYCERIN (NITROSTAT) 0.4 MG SL tablet Place 1 tablet (0.4 mg  total) under the tongue every 5 (five) minutes as needed for chest pain. 25 tablet 3   No current facility-administered medications for this visit.      Allergies:   Patient has no known allergies.   Social History:  The patient  reports that  has never smoked. he has never used smokeless tobacco. He reports that he does not drink alcohol or use drugs.   Family History:   family history is not on file.    Review of Systems: Review of Systems  Constitutional: Negative.   Respiratory: Positive for shortness of breath.   Cardiovascular: Negative.   Gastrointestinal: Negative.   Musculoskeletal: Negative.   Neurological: Negative.   Psychiatric/Behavioral: The patient is nervous/anxious.   All other systems reviewed and are negative.    PHYSICAL EXAM: VS:  BP (!) 224/100 (BP Location: Right Arm, Patient Position: Sitting, Cuff Size: Large)   Pulse 84   Ht 6\' 3"  (1.905 m)   Wt 280 lb 8 oz (127.2 kg)   BMI 35.06 kg/m  , BMI Body mass index is 35.06 kg/m.  Repeat blood pressure 190/110 by myself GEN: Well nourished, well developed, in no acute distress , obese HEENT: normal  Neck: no JVD, carotid bruits, or masses Cardiac: RRR; no murmurs, rubs, or gallops,no edema  Respiratory:  clear to auscultation bilaterally, normal work of breathing GI: soft, nontender, nondistended, + BS MS: no deformity or atrophy  Skin: warm and dry, no rash Neuro:  Strength and sensation are intact Psych: euthymic mood, full affect    Recent Labs: 08/23/2016: BUN 27; Creat 1.07; Potassium 4.2; Sodium 140    Lipid Panel Lab Results  Component Value Date   CHOL 199 08/23/2016   HDL 31 (A) 08/23/2016   LDLCALC 95 08/23/2016   TRIG 367 (A) 08/23/2016      Wt Readings from Last 3 Encounters:  02/17/17 280 lb 8 oz (127.2 kg)  12/20/16 290 lb (131.5 kg)       ASSESSMENT AND PLAN:  Resistant hypertension - Plan: EKG 12-Lead, benazepril (LOTENSIN) 40 MG tablet Long discussion  concerning various options with his medications We will try to keep him on his current prescriptions Suggested he take hydralazine 100 mg 3 times daily when he takes his clonidine 0.2 mg 3 times daily.  Recommended he increase benazepril up to 40 mg daily He will continue to monitor blood pressures at home  White coat syndrome with hypertension - Plan: EKG 12-Lead Automated cuff with him today corresponds to our manual measurements He does report systolics typically 416 up to 606 systolic at home Improved from office numbers  Controlled type 2 diabetes mellitus without complication, without long-term current use of insulin ([place]) - Plan: EKG 12-Lead We have encouraged continued exercise, careful diet management in an effort to  lose weight.  Mixed hyperlipidemia - Plan: EKG 12-Lead Blood pressure close to goal If more aggressive management is considered, could add Zetia 10 mg daily He is trying to limit the number of pills  Shortness of breath Rare episodes of shortness of breath in the setting of anxiety possibly exacerbated by severe hypertension.  For these episodes recommended he take nitroglycerin for emergencies only as a rapid way to improve his blood pressure.  Disposition:   F/U  3 months   Total encounter time more than 45 minutes  Greater than 50% was spent in counseling and coordination of care with the patient    Orders Placed This Encounter  Procedures  . EKG 12-Lead     Signed, Esmond Plants, M.D., Ph.D. 02/17/2017  [place], [place]

## 2017-02-17 ENCOUNTER — Ambulatory Visit: Payer: Medicare HMO | Admitting: Cardiovascular Disease

## 2017-02-17 ENCOUNTER — Encounter: Payer: Self-pay | Admitting: Cardiovascular Disease

## 2017-02-17 VITALS — BP 224/100 | HR 84 | Ht 75.0 in | Wt 280.5 lb

## 2017-02-17 DIAGNOSIS — E119 Type 2 diabetes mellitus without complications: Secondary | ICD-10-CM

## 2017-02-17 DIAGNOSIS — E782 Mixed hyperlipidemia: Secondary | ICD-10-CM | POA: Diagnosis not present

## 2017-02-17 DIAGNOSIS — R0602 Shortness of breath: Secondary | ICD-10-CM | POA: Diagnosis not present

## 2017-02-17 DIAGNOSIS — I10 Essential (primary) hypertension: Secondary | ICD-10-CM

## 2017-02-17 MED ORDER — HYDRALAZINE HCL 100 MG PO TABS: 100.0000 mg | ORAL_TABLET | Freq: Three times a day (TID) | ORAL | 2 refills | Status: DC

## 2017-02-17 MED ORDER — HYDRALAZINE HCL 100 MG PO TABS: 100.0000 mg | ORAL_TABLET | Freq: Four times a day (QID) | ORAL | 3 refills | Status: DC

## 2017-02-17 MED ORDER — BENAZEPRIL HCL 40 MG PO TABS: 40.0000 mg | ORAL_TABLET | Freq: Every day | ORAL | 3 refills | Status: DC

## 2017-02-17 MED ORDER — NITROGLYCERIN 0.4 MG SL SUBL: 0.4000 mg | SUBLINGUAL_TABLET | SUBLINGUAL | 3 refills | Status: DC | PRN

## 2017-02-17 NOTE — Patient Instructions (Addendum)
Medication Instructions:   Please increase the benazepril up to 40 mg once a day, Am  Or 1/2 pill twice a day)  Please increase the hydralazine up to three times a day Take extra hydralazine as needed for high blood pressure   NTG under the tongue for emergency, high blood pressure, shortness breath  Labwork:  No new labs needed  Testing/Procedures:  No further testing at this time   Follow-Up: It was a pleasure seeing you in the office today. Please call us if you have new issues that need to be addressed before your next appt.  [phone]  Your physician wants you to follow-up in: 3 months.  You will receive a reminder letter in the mail two months in advance. If you don't receive a letter, please call our office to schedule the follow-up appointment.  If you need a refill on your cardiac medications before your next appointment, please call your pharmacy.

## 2017-02-21 ENCOUNTER — Other Ambulatory Visit: Payer: Self-pay

## 2017-02-21 MED ORDER — CLONIDINE HCL 0.2 MG PO TABS: 0.2000 mg | ORAL_TABLET | Freq: Three times a day (TID) | ORAL | 1 refills | Status: DC

## 2017-03-01 ENCOUNTER — Telehealth: Payer: Self-pay | Admitting: Cardiovascular Disease

## 2017-03-01 NOTE — Telephone Encounter (Signed)
Dr. Rockey Situ,  Is an echo something you would like for this patient to have done?  I didn't see it mentioned in your note, but there was a suggestion of it mentioned in Dr. Parks Ranger note from 12/20/16.  I wasn't sure if this was something you may have mentioned to the patient as well.

## 2017-03-01 NOTE — Telephone Encounter (Signed)
Pt calling stating pt was advised to have an "ultra sound" on his heart.   Would like to know if he can still do this (need orders please)   Please advise.

## 2017-03-07 NOTE — Telephone Encounter (Signed)
Ok to order echo ABN ekg, LVH, htn urgency

## 2017-03-08 ENCOUNTER — Telehealth: Payer: Self-pay | Admitting: Cardiovascular Disease

## 2017-03-08 ENCOUNTER — Other Ambulatory Visit: Payer: Self-pay | Admitting: Cardiovascular Disease

## 2017-03-08 ENCOUNTER — Other Ambulatory Visit: Payer: Self-pay

## 2017-03-08 DIAGNOSIS — I10 Essential (primary) hypertension: Secondary | ICD-10-CM

## 2017-03-08 DIAGNOSIS — R9431 Abnormal electrocardiogram [ECG] [EKG]: Secondary | ICD-10-CM

## 2017-03-08 DIAGNOSIS — I517 Cardiomegaly: Secondary | ICD-10-CM

## 2017-03-08 NOTE — Telephone Encounter (Signed)
Pt called back. He would like to schedule echo as soon as possible and is agreeable to Jan 8, 1pm

## 2017-03-08 NOTE — Telephone Encounter (Signed)
Patient had no questions and states that this was already taken care of.

## 2017-03-08 NOTE — Telephone Encounter (Signed)
Left message on machine for patient to contact the office.  Echo order placed.

## 2017-03-08 NOTE — Telephone Encounter (Signed)
Please call regarding  Scheduling a "test". He was not sure what he was to be having done. Please call.

## 2017-03-14 ENCOUNTER — Other Ambulatory Visit: Payer: Self-pay

## 2017-03-14 ENCOUNTER — Other Ambulatory Visit: Payer: Medicare HMO

## 2017-03-14 ENCOUNTER — Ambulatory Visit (INDEPENDENT_AMBULATORY_CARE_PROVIDER_SITE_OTHER): Payer: Medicare HMO

## 2017-03-14 DIAGNOSIS — E119 Type 2 diabetes mellitus without complications: Secondary | ICD-10-CM | POA: Diagnosis not present

## 2017-03-14 DIAGNOSIS — Z125 Encounter for screening for malignant neoplasm of prostate: Secondary | ICD-10-CM

## 2017-03-14 DIAGNOSIS — I10 Essential (primary) hypertension: Secondary | ICD-10-CM | POA: Diagnosis not present

## 2017-03-14 DIAGNOSIS — R9431 Abnormal electrocardiogram [ECG] [EKG]: Secondary | ICD-10-CM

## 2017-03-14 DIAGNOSIS — I517 Cardiomegaly: Secondary | ICD-10-CM | POA: Diagnosis not present

## 2017-03-14 MED ORDER — HYDRALAZINE HCL 100 MG PO TABS: 100.0000 mg | ORAL_TABLET | Freq: Four times a day (QID) | ORAL | 3 refills | Status: DC

## 2017-03-18 LAB — CBC WITH DIFFERENTIAL/PLATELET
BASOS PCT: 0.7 %
Basophils Absolute: 41 cells/uL (ref 0–200)
EOS ABS: 220 {cells}/uL (ref 15–500)
Eosinophils Relative: 3.8 %
HCT: 37.4 % — ABNORMAL LOW (ref 38.5–50.0)
Hemoglobin: 13.2 g/dL (ref 13.2–17.1)
Lymphs Abs: 1798 cells/uL (ref 850–3900)
MCH: 29.8 pg (ref 27.0–33.0)
MCHC: 35.3 g/dL (ref 32.0–36.0)
MCV: 84.4 fL (ref 80.0–100.0)
MPV: 13.6 fL — AB (ref 7.5–12.5)
Monocytes Relative: 8.7 %
NEUTROS ABS: 3236 {cells}/uL (ref 1500–7800)
Neutrophils Relative %: 55.8 %
Platelets: 144 10*3/uL (ref 140–400)
RBC: 4.43 10*6/uL (ref 4.20–5.80)
RDW: 13.3 % (ref 11.0–15.0)
Total Lymphocyte: 31 %
WBC mixed population: 505 cells/uL (ref 200–950)
WBC: 5.8 10*3/uL (ref 3.8–10.8)

## 2017-03-18 LAB — HEMOGLOBIN A1C
HEMOGLOBIN A1C: 7.5 %{Hb} — AB (ref <5.7)
MEAN PLASMA GLUCOSE: 169 (calc)
eAG (mmol/L): 9.3 (calc)

## 2017-03-18 LAB — ALDOSTERONE + RENIN ACTIVITY W/ RATIO
ALDO / PRA RATIO: 1.4 ratio (ref 0.9–28.9)
ALDOSTERONE: 2 ng/dL
RENIN ACTIVITY: 1.41 ng/mL/h (ref 0.25–5.82)

## 2017-03-18 LAB — COMPLETE METABOLIC PANEL WITH GFR
AG Ratio: 2 (calc) (ref 1.0–2.5)
ALKALINE PHOSPHATASE (APISO): 34 U/L — AB (ref 40–115)
ALT: 21 U/L (ref 9–46)
AST: 22 U/L (ref 10–35)
Albumin: 4.7 g/dL (ref 3.6–5.1)
BILIRUBIN TOTAL: 0.4 mg/dL (ref 0.2–1.2)
BUN/Creatinine Ratio: 23 (calc) — ABNORMAL HIGH (ref 6–22)
BUN: 26 mg/dL — ABNORMAL HIGH (ref 7–25)
CHLORIDE: 104 mmol/L (ref 98–110)
CO2: 23 mmol/L (ref 20–32)
Calcium: 9.8 mg/dL (ref 8.6–10.3)
Creat: 1.15 mg/dL (ref 0.70–1.18)
GFR, Est African American: 71 mL/min/{1.73_m2} (ref >=60)
GFR, Est Non African American: 61 mL/min/{1.73_m2} (ref >=60)
GLOBULIN: 2.4 g/dL (ref 1.9–3.7)
Glucose, Bld: 160 mg/dL — ABNORMAL HIGH (ref 65–139)
POTASSIUM: 4 mmol/L (ref 3.5–5.3)
SODIUM: 140 mmol/L (ref 135–146)
Total Protein: 7.1 g/dL (ref 6.1–8.1)

## 2017-03-18 LAB — TSH: TSH: 3.66 mIU/L (ref 0.40–4.50)

## 2017-03-18 LAB — PSA, TOTAL WITH REFLEX TO PSA, FREE: PSA, Total: 3.3 ng/mL (ref <=4.0)

## 2017-03-18 LAB — T4, FREE: Free T4: 1.3 ng/dL (ref 0.8–1.8)

## 2017-03-20 ENCOUNTER — Other Ambulatory Visit: Payer: Self-pay

## 2017-03-20 MED ORDER — TRIAMCINOLONE ACETONIDE 0.1 % EX CREA: 1.0000 "application " | TOPICAL_CREAM | Freq: Three times a day (TID) | CUTANEOUS | 1 refills | Status: DC

## 2017-03-21 ENCOUNTER — Encounter: Payer: Self-pay | Admitting: Family Medicine

## 2017-03-21 ENCOUNTER — Ambulatory Visit (INDEPENDENT_AMBULATORY_CARE_PROVIDER_SITE_OTHER): Payer: Medicare HMO | Admitting: Family Medicine

## 2017-03-21 ENCOUNTER — Other Ambulatory Visit: Payer: Self-pay

## 2017-03-21 VITALS — BP 180/64 | HR 77 | Temp 99.0°F | Ht 75.0 in | Wt 284.4 lb

## 2017-03-21 DIAGNOSIS — L308 Other specified dermatitis: Secondary | ICD-10-CM

## 2017-03-21 DIAGNOSIS — E119 Type 2 diabetes mellitus without complications: Secondary | ICD-10-CM

## 2017-03-21 DIAGNOSIS — I10 Essential (primary) hypertension: Secondary | ICD-10-CM | POA: Diagnosis not present

## 2017-03-21 MED ORDER — TRIAMCINOLONE ACETONIDE 0.1 % EX CREA
1.0000 | TOPICAL_CREAM | Freq: Three times a day (TID) | CUTANEOUS | 1 refills | Status: DC
Start: 2017-03-21 — End: 2017-11-02

## 2017-03-21 NOTE — Progress Notes (Signed)
Subjective:    Patient ID: Robert Daniel, male    DOB: Apr 03, 1940, 77 y.o.   MRN: [id number]  Robert Daniel is a 77 y.o. male presenting on 03/21/2017 for Hypertension and Back Pain (lower back pain that started this morning. Pt report that he have intermitent lower back pain and that it's associated w/ arthritis.)   HPI   Specialists: - Dermatology - Dr Nehemiah Massed ([place]) - Chronic Rash Lower Extremity, thought to be secondary to allergy from beta blocker and/or amlodipine - Dr Lorie Apley ([place])  Previous specialists: - Vascular Surgery - Dr Leotis Pain - Cardiac Catheterization, reportedly 40% blockage but do not have rest of report - Nephrology - Dr Lavonia Dana (2013 [facility]) - had work-up for potential resistant hypertension, reportedly unremarkable testing based on PCP records with negative renal arteriogram no evidence of RAS  CHRONIC HTN, Uncontrolled Reports a long history of known white coat hypertension with elevated BP at doctors office, usually it improves upon re-check, he does check it at home occasionally, and states previously goal was around 150/90 or less, he has had dramatic elevations in past as well. - Again he admits that always in doctors office BP is high and at home it is better closer to 140-150 - Interval history he was referred to [facility] - saw Dr Rockey Situ 02/17/17, his meds were increased Benazepril from 20 to 40 and Hydralazine from 2 to 3 times daily and may go to QID, he had BP >200 at that visit - Today reports no new concern, feels anxious, at doctors office, but still admits to being asymptomatic regularly with his BP Current Meds - Benazepril 40mg  daily, Clonidine 0.2mg  TID, Hydralazine 100mg  QID - Also failed Metoprolol, HCTZ, Diovan, Bystolic, Amlodipine - He was also given NTG for PRN, he has not sued Reports good compliance, took meds today. Tolerating well, w/o complaints. Lifestyle: - Diet:  tries to limit carbs for DM, low salt - Exercise: he admits needs to resume more regular exercise with goal wt loss Denies CP, dyspnea, HA, edema, dizziness / lightheadedness  CHRONIC DM, Type 2: Reports doing well with some initial weight loss on lifestyle changes. Weight loss from 290 to 280 then gain back to 284 Last A1c prior to visit 03/14/17, at 7.5, stable to slight improve from previous reading Meds: Metformin 1000mg  BID, Glimepiride 4mg  twice daily Reports good compliance. Tolerating well w/o side-effects Currently on ACEi Denies hypoglycemia, polyuria, visual changes, numbness or tingling.   Depression screen PHQ 2/9 12/20/2016  Decreased Interest 0  Down, Depressed, Hopeless 0  PHQ - 2 Score 0    Social History   Tobacco Use  . Smoking status: Never Smoker  . Smokeless tobacco: Never Used  Substance Use Topics  . Alcohol use: No  . Drug use: No    Review of Systems Per HPI unless specifically indicated above     Objective:    BP (!) 180/64 (BP Location: Left Arm, Cuff Size: Large)   Pulse 77   Temp 99 F (37.2 C) (Oral)   Ht 6\' 3"  (1.905 m)   Wt 284 lb 6.4 oz (129 kg)   BMI 35.55 kg/m   Wt Readings from Last 3 Encounters:  03/21/17 284 lb 6.4 oz (129 kg)  02/17/17 280 lb 8 oz (127.2 kg)  12/20/16 290 lb (131.5 kg)    Physical Exam  Constitutional: He is oriented to person, place, and time. He appears well-developed and well-nourished.  No distress.  Well-appearing, comfortable, cooperative  HENT:  Head: Normocephalic and atraumatic.  Mouth/Throat: Oropharynx is clear and moist.  Eyes: Conjunctivae are normal. Right eye exhibits no discharge. Left eye exhibits no discharge.  Neck: Normal range of motion. Neck supple. No thyromegaly present.  Cardiovascular: Normal rate, regular rhythm, normal heart sounds and intact distal pulses.  No murmur heard. Pulmonary/Chest: Effort normal and breath sounds normal. No respiratory distress. He has no wheezes. He  has no rales.  Musculoskeletal: Normal range of motion. He exhibits no edema.  Lymphadenopathy:    He has no cervical adenopathy.  Neurological: He is alert and oriented to person, place, and time.  Skin: Skin is warm and dry. No rash noted. He is not diaphoretic. No erythema.  Psychiatric: He has a normal mood and affect. His behavior is normal.  Well groomed, good eye contact, normal speech and thoughts. Mildly anxious appearing  Nursing note and vitals reviewed.  Results for orders placed or performed in visit on 03/14/17  Aldosterone + renin activity w/ ratio  Result Value Ref Range   Aldosterone 2 - ng/dL   Renin Activity 1.41 0.25 - 5.82 ng/mL/h   ALDO / PRA Ratio 1.4 0.9 - 28.9 Ratio  CBC with Differential/Platelet  Result Value Ref Range   WBC 5.8 3.8 - 10.8 Thousand/uL   RBC 4.43 4.20 - 5.80 Million/uL   Hemoglobin 13.2 13.2 - 17.1 g/dL   HCT 37.4 (L) 38.5 - 50.0 %   MCV 84.4 80.0 - 100.0 fL   MCH 29.8 27.0 - 33.0 pg   MCHC 35.3 32.0 - 36.0 g/dL   RDW 13.3 11.0 - 15.0 %   Platelets 144 140 - 400 Thousand/uL   MPV 13.6 (H) 7.5 - 12.5 fL   Neutro Abs 3,236 1,500 - 7,800 cells/uL   Lymphs Abs 1,798 850 - 3,900 cells/uL   WBC mixed population 505 200 - 950 cells/uL   Eosinophils Absolute 220 15 - 500 cells/uL   Basophils Absolute 41 0 - 200 cells/uL   Neutrophils Relative % 55.8 %   Total Lymphocyte 31.0 %   Monocytes Relative 8.7 %   Eosinophils Relative 3.8 %   Basophils Relative 0.7 %  T4, free  Result Value Ref Range   Free T4 1.3 0.8 - 1.8 ng/dL  TSH  Result Value Ref Range   TSH 3.66 0.40 - 4.50 mIU/L  PSA, Total with Reflex to PSA, Free  Result Value Ref Range   PSA, Total 3.3 < OR = 4.0 ng/mL  Hemoglobin A1c  Result Value Ref Range   Hgb A1c MFr Bld 7.5 (H) <5.7 % of total Hgb   Mean Plasma Glucose 169 (calc)   eAG (mmol/L) 9.3 (calc)  COMPLETE METABOLIC PANEL WITH GFR  Result Value Ref Range   Glucose, Bld 160 (H) 65 - 139 mg/dL   BUN 26 (H) 7 - 25  mg/dL   Creat 1.15 0.70 - 1.18 mg/dL   GFR, Est Non African American 61 > OR = 60 mL/min/1.13m2   GFR, Est African American 71 > OR = 60 mL/min/1.48m2   BUN/Creatinine Ratio 23 (H) 6 - 22 (calc)   Sodium 140 135 - 146 mmol/L   Potassium 4.0 3.5 - 5.3 mmol/L   Chloride 104 98 - 110 mmol/L   CO2 23 20 - 32 mmol/L   Calcium 9.8 8.6 - 10.3 mg/dL   Total Protein 7.1 6.1 - 8.1 g/dL   Albumin 4.7 3.6 - 5.1 g/dL  Globulin 2.4 1.9 - 3.7 g/dL (calc)   AG Ratio 2.0 1.0 - 2.5 (calc)   Total Bilirubin 0.4 0.2 - 1.2 mg/dL   Alkaline phosphatase (APISO) 34 (L) 40 - 115 U/L   AST 22 10 - 35 U/L   ALT 21 9 - 46 U/L      Assessment & Plan:   Problem List Items Addressed This Visit    Controlled type 2 diabetes mellitus without complication (H[facility])    Controlled A1c 7.5, from prior 7.6 No known complications or hypoglycemia.  Plan:  1. Continue current therapy - Metformin 1000mg  BID, Glimepiride 4mg  BID 2. Encourage improved lifestyle - low carb, low sugar diet, reduce portion size, continue improving regular exercise 3. Check CBG, bring log to next visit for review 4. Continue ASA, ACEi, Statin 5. Due for future DM Foot exam 6. Follow-up 6 mo physical + labs      Resistant hypertension - Primary    Asymptomatic, despite still significantly uncontrolled BP initial >200 then improve manual to 180s. Primarily attributed to high anxiety at doctors office - Home avg 140-150/60-80. Outside at Cardiology >161 - No known complications - Prior work-up done by Vascular and Renal with negative secondary HTN work-up, negative RAS. Recent work-up negative Hyperaldosterone labs, TSH - Failed: Bystolic, Metoprolol, HCTZ, Diovan, Amlodipine - Followed by [facility]  Plan:  1. Continue current BP regimen- continue Benazepril 40mg , Clonidine 0.2mg  TID, Hydralazine 100mg  QID 2. Encourage improved lifestyle - low sodium diet, regular exercise 3. Continue monitor BP outside office, bring readings to next  visit for review 4. Follow-up with Cardiology for further work-up and management, as I have limited options left - now may pursue possible OSA  Strict return precautions given to patient when to go to hospital ED if worsening symptoms with BP      White coat syndrome with hypertension    Other Visit Diagnoses    Other eczema       Relevant Medications   triamcinolone cream (KENALOG) 0.1 %      Meds ordered this encounter  Medications  . triamcinolone cream (KENALOG) 0.1 %    Sig: Apply 1 application topically 3 (three) times daily.    Dispense:  453.6 g    Refill:  1    Change amount from 30g to 453.6 g or 1 lb jar    Follow up plan: Return in about 6 months (around 09/18/2017) for Annual Physical.  Future labs ordered 08/2017  Nobie Putnam, [facility] 03/21/2017, 11:32 AM

## 2017-03-21 NOTE — Patient Instructions (Addendum)
Thank you for coming to the office today.  1. Keep working on improving lifestyle and weight loss  Keep track of BP at home with changes and blood sugar  If any dramatic worsening or concerns.  DUE for FASTING BLOOD WORK (no food or drink after midnight before the lab appointment, only water or coffee without cream/sugar on the morning of)  SCHEDULE "Lab Only" visit in the morning at the clinic for lab draw in 6 MONTHS   - Make sure Lab Only appointment is at about 1 week before your next appointment, so that results will be available  For Lab Results, once available within 2-3 days of blood draw, you can can log in to MyChart online to view your results and a brief explanation. Also, we can discuss results at next follow-up visit.  Please schedule a Follow-up Appointment to: Return in about 6 months (around 09/18/2017) for Annual Physical.    If you have any other questions or concerns, please feel free to call the office or send a message through [place]. You may also schedule an earlier appointment if necessary.  Additionally, you may be receiving a survey about your experience at our office within a few days to 1 week by e-mail or mail. We value your feedback.  Nobie Putnam, DO [place]

## 2017-03-22 ENCOUNTER — Encounter: Payer: Self-pay | Admitting: Family Medicine

## 2017-03-22 ENCOUNTER — Other Ambulatory Visit: Payer: Self-pay | Admitting: Family Medicine

## 2017-03-22 DIAGNOSIS — Z125 Encounter for screening for malignant neoplasm of prostate: Secondary | ICD-10-CM

## 2017-03-22 DIAGNOSIS — E1169 Type 2 diabetes mellitus with other specified complication: Secondary | ICD-10-CM

## 2017-03-22 DIAGNOSIS — E119 Type 2 diabetes mellitus without complications: Secondary | ICD-10-CM

## 2017-03-22 DIAGNOSIS — I10 Essential (primary) hypertension: Secondary | ICD-10-CM

## 2017-03-22 DIAGNOSIS — Z0000 Encounter for general adult medical examination without abnormal findings: Secondary | ICD-10-CM

## 2017-03-22 DIAGNOSIS — E785 Hyperlipidemia, unspecified: Secondary | ICD-10-CM

## 2017-03-22 NOTE — Assessment & Plan Note (Signed)
Controlled A1c 7.5, from prior 7.6 No known complications or hypoglycemia.  Plan:  1. Continue current therapy - Metformin 1000mg  BID, Glimepiride 4mg  BID 2. Encourage improved lifestyle - low carb, low sugar diet, reduce portion size, continue improving regular exercise 3. Check CBG, bring log to next visit for review 4. Continue ASA, ACEi, Statin 5. Due for future DM Foot exam 6. Follow-up 6 mo physical + labs

## 2017-03-22 NOTE — Assessment & Plan Note (Addendum)
Asymptomatic, despite still significantly uncontrolled BP initial >200 then improve manual to 180s. Primarily attributed to high anxiety at doctors office - Home avg 140-150/60-80. Outside at Cardiology >161 - No known complications - Prior work-up done by Vascular and Renal with negative secondary HTN work-up, negative RAS. Recent work-up negative Hyperaldosterone labs, TSH - Failed: Bystolic, Metoprolol, HCTZ, Diovan, Amlodipine - Followed by [facility] Cardiology  Plan:  1. Continue current BP regimen- continue Benazepril 40mg , Clonidine 0.2mg  TID, Hydralazine 100mg  QID 2. Encourage improved lifestyle - low sodium diet, regular exercise 3. Continue monitor BP outside office, bring readings to next visit for review 4. Follow-up with Cardiology for further work-up and management, as I have limited options left - now may pursue possible OSA  Strict return precautions given to patient when to go to hospital ED if worsening symptoms with BP

## 2017-05-03 ENCOUNTER — Other Ambulatory Visit: Payer: Self-pay | Admitting: Family Medicine

## 2017-05-03 DIAGNOSIS — E119 Type 2 diabetes mellitus without complications: Secondary | ICD-10-CM

## 2017-05-03 MED ORDER — GLIMEPIRIDE 4 MG PO TABS: 8.0000 mg | ORAL_TABLET | Freq: Every day | ORAL | 1 refills | Status: DC

## 2017-05-12 ENCOUNTER — Telehealth: Payer: Self-pay | Admitting: Family Medicine

## 2017-05-12 ENCOUNTER — Ambulatory Visit (INDEPENDENT_AMBULATORY_CARE_PROVIDER_SITE_OTHER): Payer: Medicare HMO | Admitting: Family Medicine

## 2017-05-12 ENCOUNTER — Ambulatory Visit
Admission: RE | Admit: 2017-05-12 | Discharge: 2017-05-12 | Disposition: A | Discharge status: Home / Self Care | Payer: Medicare HMO | Source: Ambulatory Visit | Attending: Family Medicine | Admitting: Family Medicine

## 2017-05-12 ENCOUNTER — Other Ambulatory Visit: Payer: Self-pay

## 2017-05-12 ENCOUNTER — Encounter: Payer: Self-pay | Admitting: Family Medicine

## 2017-05-12 VITALS — BP 174/90 | HR 88 | Ht 75.0 in | Wt 276.0 lb

## 2017-05-12 DIAGNOSIS — M25561 Pain in right knee: Secondary | ICD-10-CM

## 2017-05-12 DIAGNOSIS — M1711 Unilateral primary osteoarthritis, right knee: Secondary | ICD-10-CM

## 2017-05-12 DIAGNOSIS — R600 Localized edema: Secondary | ICD-10-CM

## 2017-05-12 IMAGING — DX DG KNEE COMPLETE 4+V*R*
4 series · 4 of 4 positions shown · non-contrast
Comparison: None.

CLINICAL DATA: Acute pain of right knee,, bilateral and posterior,
w/swelling, no injury

EXAM:
RIGHT KNEE - COMPLETE 4+ VIEW

[knee ap]
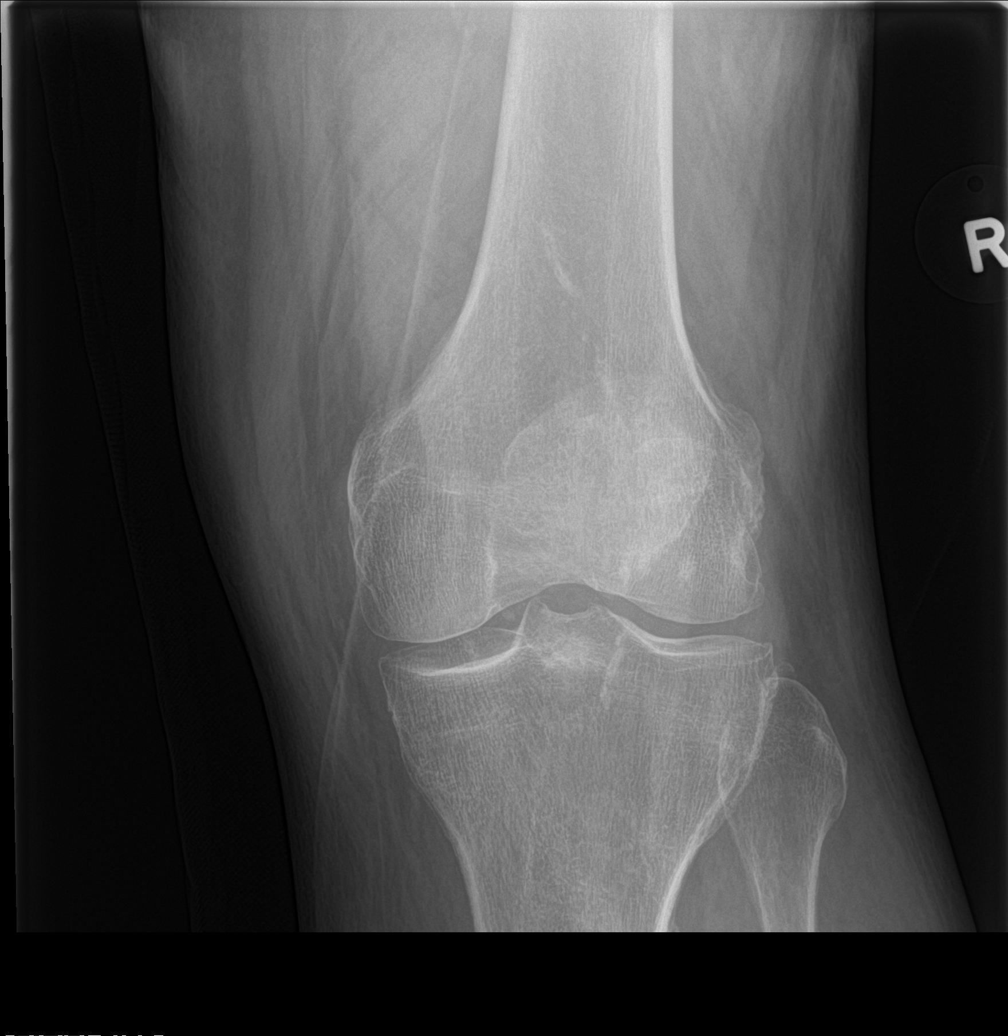

[knee lat]
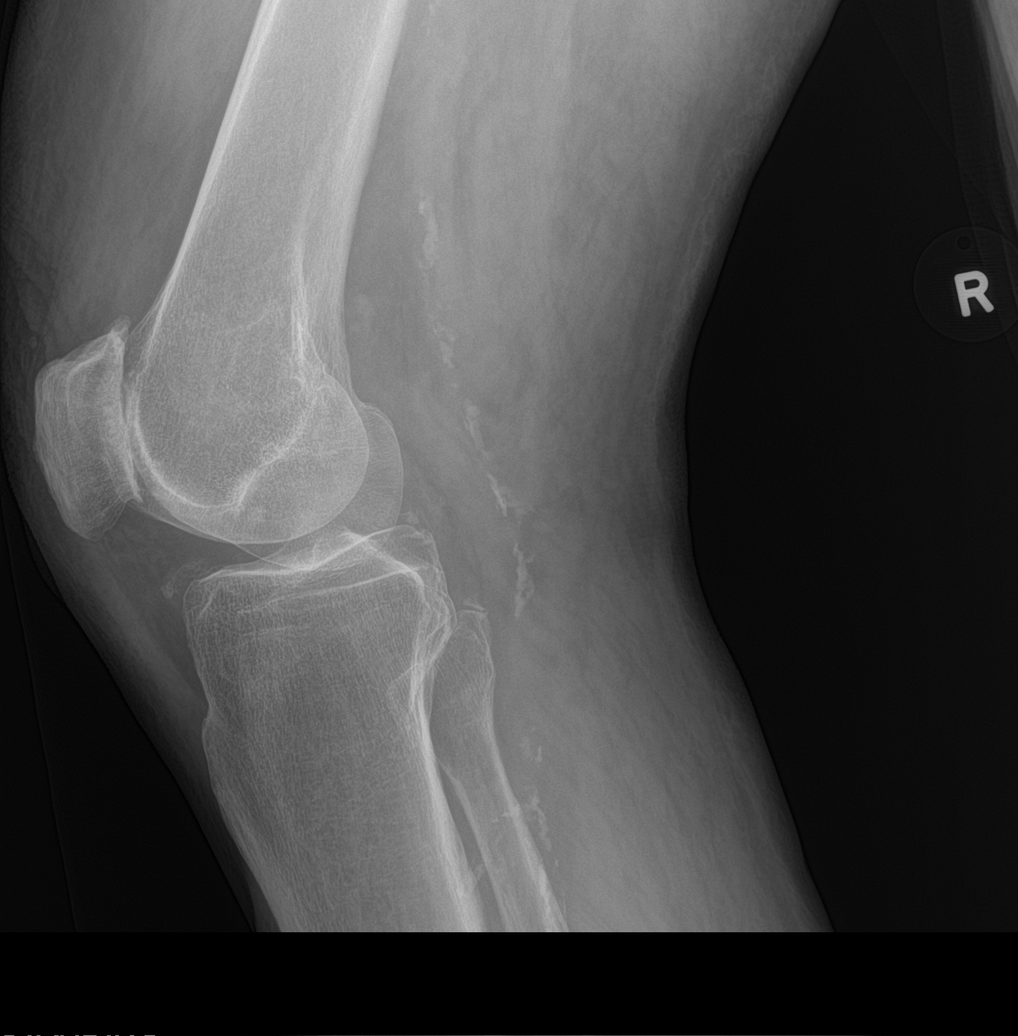

[knee obl (1 of 2)]
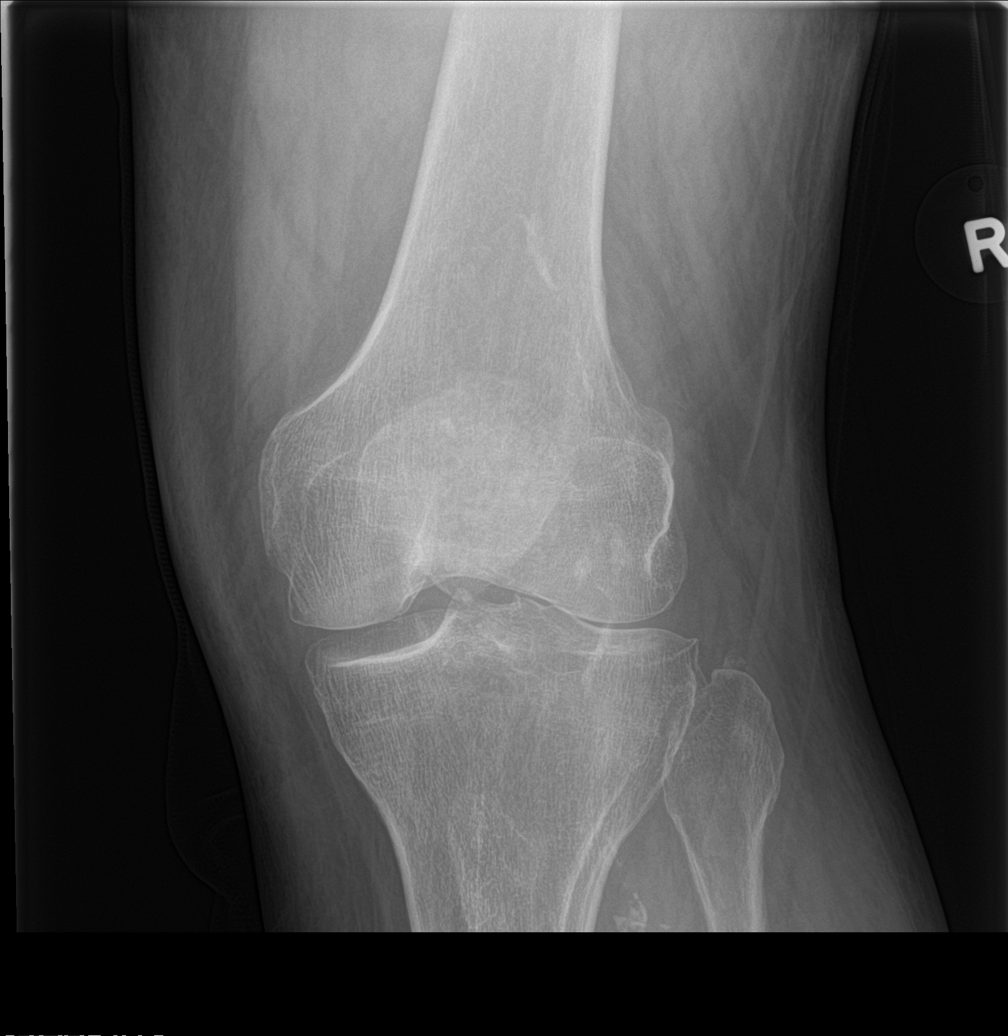

[knee obl (2 of 2)]
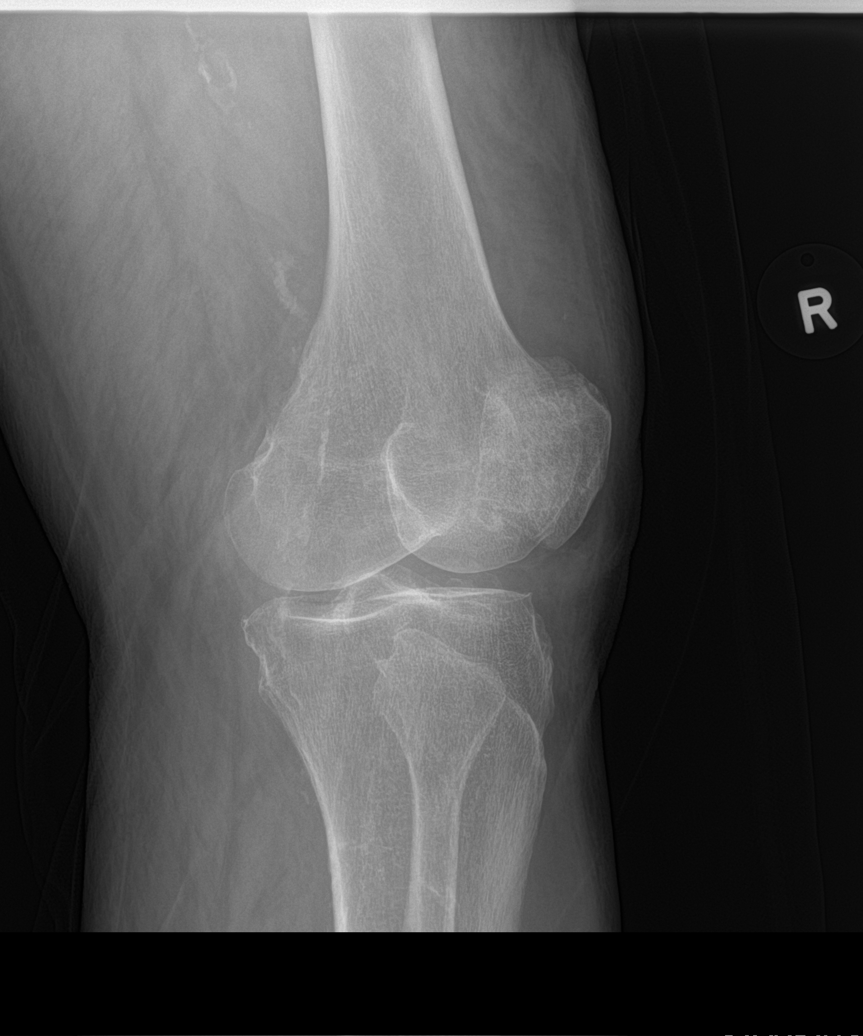

[4 of 4 positions shown; findings below may reference images not displayed]

FINDINGS: No fracture.  No bone lesion.

The femorotibial joint space compartments are well preserved.

There is narrowing of the patellofemoral joint space compartment
with mild irregular sclerosis along the dorsal aspect of the patella
and marginal osteophytes from the superior and inferior patella.

No convincing joint effusion.

There are scattered vascular calcifications posteriorly.
IMPRESSION: 1. No fracture or acute finding.
2. Arthropathic/degenerative changes of the patellofemoral joint
space compartment.

## 2017-05-12 MED ORDER — TORSEMIDE 100 MG PO TABS: 100.0000 mg | ORAL_TABLET | ORAL | 1 refills | Status: DC | PRN

## 2017-05-12 MED ORDER — POTASSIUM CHLORIDE 20 MEQ PO PACK: 20.0000 meq | PACK | ORAL | 1 refills | Status: DC | PRN

## 2017-05-12 NOTE — Telephone Encounter (Signed)
Pt needs refill on potassium and torsemide sent to [facility].

## 2017-05-12 NOTE — Patient Instructions (Addendum)
Thank you for coming to the office today.  1.  Most likely arthritis in the knee, this is worse with storms and rainy cold weather, does not require an injury to flare up, you probably have some degenerative arthritis in the bones of your knee as well  There is some swelling, likely limiting your movement  I do not think you have a torn meniscus or ligament  - X-rays today, I will send results to MyChart with information once we receive it, likely within 24 hours  Recommend trial of Anti-inflammatory with Aleve OTC 250 tabs - take TWO with food and plenty of water TWICE daily every day (breakfast and dinner), for next 1 week, then you may take only as needed - DO NOT TAKE any ibuprofen, advil, motrin while you are taking this medicine - It is safe to take Tylenol Ext Str 500mg  tabs - take 1 to 2 (max dose 1000mg ) every 6 hours as needed for breakthrough pain, max 24 hour daily dose is 6 to 8 tablets or 4000mg   Use RICE therapy: - R - Rest / relative rest with activity modification avoid overuse and frequent bending or pressure on bent knee - I - Ice packs (make sure you use a towel or sock / something to protect skin) - C - Compression with flexible Knee Sleeve to apply pressure and reduce swelling allowing more support - E - Elevation - if significant swelling, lift leg above heart level (toes above your nose) to help reduce swelling, most helpful at night after day of being on your feet  If not improving, then if swelling is increased may take the Torsemide that you have to reduce fluid  Please schedule a Follow-up Appointment to: Return in about 6 weeks (around 06/23/2017) for Knee pain.  If you have any other questions or concerns, please feel free to call the office or send a message through [place]. You may also schedule an earlier appointment if necessary.  Additionally, you may be receiving a survey about your experience at our office within a few days to 1 week by e-mail or mail.  We value your feedback.  Nobie Putnam, DO [place]

## 2017-05-12 NOTE — Telephone Encounter (Signed)
Refilled both meds, only taking intermittent and infrequently for edema. Now with R knee pain and swelling may take if needed.  Nobie Putnam, DO [facility] 05/12/2017, 5:22 PM

## 2017-05-12 NOTE — Progress Notes (Signed)
Subjective:    Patient ID: Robert Daniel, male    DOB: [birth year], 77 y.o.   MRN: [id number]  Robert Daniel is a 77 y.o. male presenting on 05/12/2017 for Knee Pain (right knee pain, swelling, difficulty w/ ROM x 1 week )  Patient presents for a same day appointment.  HPI   RIGHT KNEE PAIN / SWELLING: Reports symptoms started 1 week ago with recent cold rainy weather he noticed left knee pain with moderate soreness and pain, then some mild swelling associated with R knee only, seemed to be slightly worsening and not improved. In past has had mild temporary aches before. - Took Ibuprofen 800mg  and Tylenol 500mg  x 2 with some relief - he has cane, also has knee sleeve but not wearing - No prior history of dx arthritis, never seen, orthopedics, no prior injection or surgery - He has been biking at [facility] recently, he did this recently without problem - Admits some ankle edema, he takes some Torsemide and potassium occasionally due to swelling, will need refill to use PRN - Denies any injury, fall, twist, joint swelling or redness  PMH - Resistant HTN  Depression screen PHQ 2/9 12/20/2016  Decreased Interest 0  Down, Depressed, Hopeless 0  PHQ - 2 Score 0    Social History   Tobacco Use  . Smoking status: Never Smoker  . Smokeless tobacco: Never Used  Substance Use Topics  . Alcohol use: No  . Drug use: No    Review of Systems Per HPI unless specifically indicated above     Objective:    BP (!) 174/90 (BP Location: Left Arm, Cuff Size: Normal)   Pulse 88   Ht 6\' 3"  (1.905 m)   Wt 276 lb (125.2 kg) Comment: pt reported  BMI 34.50 kg/m   Wt Readings from Last 3 Encounters:  05/12/17 276 lb (125.2 kg)  03/21/17 284 lb 6.4 oz (129 kg)  02/17/17 280 lb 8 oz (127.2 kg)    Physical Exam  Constitutional: He is oriented to person, place, and time. He appears well-developed and well-nourished. No distress.  Well-appearing, comfortable, cooperative  HENT:  Head:  Normocephalic and atraumatic.  Mouth/Throat: Oropharynx is clear and moist.  Eyes: Conjunctivae are normal. Right eye exhibits no discharge. Left eye exhibits no discharge.  Cardiovascular: Normal rate.  Pulmonary/Chest: Effort normal.  Musculoskeletal: He exhibits edema (Bilateral lower extremity ankles and legs with +1 pitting edema, slightly worse R>L from previous).  Bilateral Knees Inspection: Slightly bulkier appearance R > L knee. With mild to moderate soft tissue edema vs effusion R knee, lateral joint line. No ecchymosis Palpation: Mild tender posterior aspect R knee, joint line mild tender but not significant. Fine crepitus bilateral knees. ROM: L knee full ROM, R knee limited flexion and extension due to swelling and pain. Special Testing: Lachman / Valgus/Varus tests negative with intact ligaments (ACL, MCL, LCL). Standing Thesslay negative for meniscus provoked symptoms on standing rotation. Strength: 5/5 intact knee flex/ext, ankle dorsi/plantarflex Neurovascular: distally intact sensation light touch and pulses  Neurological: He is alert and oriented to person, place, and time.  Skin: Skin is warm and dry. No rash noted. He is not diaphoretic. No erythema.  Psychiatric: He has a normal mood and affect. His behavior is normal.  Well groomed, good eye contact, normal speech and thoughts  Nursing note and vitals reviewed.    CLINICAL DATA: Acute pain of right knee,, bilateral and posterior, w/swelling, no injury  EXAM: RIGHT KNEE -  COMPLETE 4+ VIEW  COMPARISON: None.  FINDINGS: No fracture. No bone lesion.  The femorotibial joint space compartments are well preserved.  There is narrowing of the patellofemoral joint space compartment with mild irregular sclerosis along the dorsal aspect of the patella and marginal osteophytes from the superior and inferior patella.  No convincing joint effusion.  There are scattered vascular calcifications  posteriorly.  IMPRESSION: 1. No fracture or acute finding. 2. Arthropathic/degenerative changes of the patellofemoral joint space compartment.   Electronically Signed By: Lajean Manes M.D. On: 05/12/2017 14:29  Results for orders placed or performed in visit on 03/14/17  Aldosterone + renin activity w/ ratio  Result Value Ref Range   Aldosterone 2  ng/dL   Renin Activity 1.41 0.25 - 5.82 ng/mL/h   ALDO / PRA Ratio 1.4 0.9 - 28.9 Ratio  CBC with Differential/Platelet  Result Value Ref Range   WBC 5.8 3.8 - 10.8 Thousand/uL   RBC 4.43 4.20 - 5.80 Million/uL   Hemoglobin 13.2 13.2 - 17.1 g/dL   HCT 37.4 (L) 38.5 - 50.0 %   MCV 84.4 80.0 - 100.0 fL   MCH 29.8 27.0 - 33.0 pg   MCHC 35.3 32.0 - 36.0 g/dL   RDW 13.3 11.0 - 15.0 %   Platelets 144 140 - 400 Thousand/uL   MPV 13.6 (H) 7.5 - 12.5 fL   Neutro Abs 3,236 1,500 - 7,800 cells/uL   Lymphs Abs 1,798 850 - 3,900 cells/uL   WBC mixed population 505 200 - 950 cells/uL   Eosinophils Absolute 220 15 - 500 cells/uL   Basophils Absolute 41 0 - 200 cells/uL   Neutrophils Relative % 55.8 %   Total Lymphocyte 31.0 %   Monocytes Relative 8.7 %   Eosinophils Relative 3.8 %   Basophils Relative 0.7 %  T4, free  Result Value Ref Range   Free T4 1.3 0.8 - 1.8 ng/dL  TSH  Result Value Ref Range   TSH 3.66 0.40 - 4.50 mIU/L  PSA, Total with Reflex to PSA, Free  Result Value Ref Range   PSA, Total 3.3 < OR = 4.0 ng/mL  Hemoglobin A1c  Result Value Ref Range   Hgb A1c MFr Bld 7.5 (H) <5.7 % of total Hgb   Mean Plasma Glucose 169 (calc)   eAG (mmol/L) 9.3 (calc)  COMPLETE METABOLIC PANEL WITH GFR  Result Value Ref Range   Glucose, Bld 160 (H) 65 - 139 mg/dL   BUN 26 (H) 7 - 25 mg/dL   Creat 1.15 0.70 - 1.18 mg/dL   GFR, Est Non African American 61 > OR = 60 mL/min/1.63m2   GFR, Est African American 71 > OR = 60 mL/min/1.7m2   BUN/Creatinine Ratio 23 (H) 6 - 22 (calc)   Sodium 140 135 - 146 mmol/L   Potassium 4.0 3.5 -  5.3 mmol/L   Chloride 104 98 - 110 mmol/L   CO2 23 20 - 32 mmol/L   Calcium 9.8 8.6 - 10.3 mg/dL   Total Protein 7.1 6.1 - 8.1 g/dL   Albumin 4.7 3.6 - 5.1 g/dL   Globulin 2.4 1.9 - 3.7 g/dL (calc)   AG Ratio 2.0 1.0 - 2.5 (calc)   Total Bilirubin 0.4 0.2 - 1.2 mg/dL   Alkaline phosphatase (APISO) 34 (L) 40 - 115 U/L   AST 22 10 - 35 U/L   ALT 21 9 - 46 U/L      Assessment & Plan:   Problem List Items Addressed This Visit  Patellofemoral arthritis of right knee    Acute R generalized Knee pain lower anterior and swelling without known injury or trauma - Suspected underlying OA/DJD, no prior known, given symptoms and age - Less concern for meniscus injury given exam and history - Able to bear weight, no knee instability or mechanical locking - No prior history of knee surgery, arthroscopy - Inadequate conservative therapy   Plan: 1. Check R Knee X-ray today - called patient later with results, see above, mild mod patellofemoral DJD, no frx - DC Ibuprofen - Try OTC regular dosing anti-inflammatory trial with rx Aleve OTC 250mg  x 2 for 500mg  BID wc x 2-4 weeks, then PRN - Continue Tylenol 500-1000mg  per dose TID PRN breakthrough - RICE therapy (rest, ice, compression knee sleeve, elevation) for swelling, activity modification Follow-up 4-6 weeks, if still worsening, consider steroid injection and referral to Ortho vs PT for further eval      Relevant Medications      acetaminophen (TYLENOL) 500 MG tablet    Other Visit Diagnoses    Acute pain of right knee    -  Primary   Relevant Orders   DG Knee Complete 4 Views Right (Completed)      No orders of the defined types were placed in this encounter.   Follow up plan: Return in about 6 weeks (around 06/23/2017) for Knee pain.  Nobie Putnam, [facility] 05/13/2017, 12:16 AM

## 2017-05-13 NOTE — Assessment & Plan Note (Signed)
Acute R generalized Knee pain lower anterior and swelling without known injury or trauma - Suspected underlying OA/DJD, no prior known, given symptoms and age - Less concern for meniscus injury given exam and history - Able to bear weight, no knee instability or mechanical locking - No prior history of knee surgery, arthroscopy - Inadequate conservative therapy   Plan: 1. Check R Knee X-ray today - called patient later with results, see above, mild mod patellofemoral DJD, no frx - DC Ibuprofen - Try OTC regular dosing anti-inflammatory trial with rx Aleve OTC 250mg  x 2 for 500mg  BID wc x 2-4 weeks, then PRN - Continue Tylenol 500-1000mg  per dose TID PRN breakthrough - RICE therapy (rest, ice, compression knee sleeve, elevation) for swelling, activity modification Follow-up 4-6 weeks, if still worsening, consider steroid injection and referral to Ortho vs PT for further eval

## 2017-05-16 ENCOUNTER — Other Ambulatory Visit: Payer: Self-pay

## 2017-05-16 MED ORDER — POTASSIUM CHLORIDE CRYS ER 20 MEQ PO TBCR
20.0000 meq | EXTENDED_RELEASE_TABLET | Freq: Every day | ORAL | 3 refills | Status: DC
Start: 2017-05-16 — End: 2018-02-14

## 2017-08-08 ENCOUNTER — Other Ambulatory Visit: Payer: Medicare HMO

## 2017-08-08 ENCOUNTER — Ambulatory Visit (INDEPENDENT_AMBULATORY_CARE_PROVIDER_SITE_OTHER): Payer: Medicare HMO

## 2017-08-08 VITALS — BP 210/80 | HR 84 | Temp 98.2°F | Resp 17 | Ht 75.0 in | Wt 288.8 lb

## 2017-08-08 DIAGNOSIS — Z0000 Encounter for general adult medical examination without abnormal findings: Secondary | ICD-10-CM | POA: Diagnosis not present

## 2017-08-08 DIAGNOSIS — E1169 Type 2 diabetes mellitus with other specified complication: Secondary | ICD-10-CM

## 2017-08-08 DIAGNOSIS — I10 Essential (primary) hypertension: Secondary | ICD-10-CM

## 2017-08-08 DIAGNOSIS — R030 Elevated blood-pressure reading, without diagnosis of hypertension: Secondary | ICD-10-CM | POA: Diagnosis not present

## 2017-08-08 DIAGNOSIS — E119 Type 2 diabetes mellitus without complications: Secondary | ICD-10-CM

## 2017-08-08 DIAGNOSIS — E785 Hyperlipidemia, unspecified: Secondary | ICD-10-CM

## 2017-08-08 DIAGNOSIS — Z125 Encounter for screening for malignant neoplasm of prostate: Secondary | ICD-10-CM | POA: Diagnosis not present

## 2017-08-08 MED ORDER — CLONIDINE HCL 0.1 MG PO TABS
0.1000 mg | ORAL_TABLET | Freq: Once | ORAL | Status: AC
  Administered 2017-08-08: 0.1 mg via ORAL

## 2017-08-08 NOTE — Progress Notes (Signed)
Subjective:   Robert Daniel is a 77 y.o. male who presents for an Initial Medicare Annual Wellness Visit.  Review of Systems   Cardiac Risk Factors include: hypertension;male gender;advanced age (>64men, >74 women);dyslipidemia;diabetes mellitus;obesity (BMI >30kg/m2)    Objective:    Today's Vitals   08/08/17 1043 08/08/17 1120 08/08/17 1140 08/08/17 1157  BP:  (!) 228/93 (!) 228/93 (!) 239/93  Pulse:      Resp:      Temp:      TempSrc:      Weight:      Height:      PainSc: 0-No pain      Body mass index is 36.1 kg/m.  Advanced Directives 08/08/2017  Does Patient Have a Medical Advance Directive? No  Would patient like information on creating a medical advance directive? Yes (MAU/Ambulatory/Procedural Areas - Information given)    Current Medications (verified) Outpatient Encounter Medications as of 08/08/2017  Medication Sig  . acetaminophen (TYLENOL) 500 MG tablet Take 500 mg by mouth every 6 (six) hours as needed.  . Ascorbic Acid (VITAMIN C) 1000 MG tablet Take 1,000 mg by mouth daily.  Marland Kitchen aspirin EC 81 MG tablet Take 81 mg by mouth daily.  . benazepril (LOTENSIN) 40 MG tablet Take 1 tablet (40 mg total) by mouth daily.  . calcium citrate-vitamin D (CALCIUM + D) 315-200 MG-UNIT tablet Take 1 tablet by mouth once.  . cloNIDine (CATAPRES) 0.2 MG tablet Take 1 tablet (0.2 mg total) by mouth 3 (three) times daily.  Marland Kitchen co-enzyme Q-10 30 MG capsule Take 30 mg by mouth 3 (three) times daily.  . Cyanocobalamin (VITAMIN B-12) 5000 MCG TBDP Take by mouth.  . Fenugreek 610 MG CAPS Take by mouth.  Marland Kitchen gemfibrozil (LOPID) 600 MG tablet Take 600 mg by mouth 2 (two) times daily before a meal.   . glimepiride (AMARYL) 4 MG tablet Take 2 tablets (8 mg total) by mouth daily with breakfast.  . hydrALAZINE (APRESOLINE) 100 MG tablet Take 1 tablet (100 mg total) by mouth 4 (four) times daily.  Marland Kitchen ibuprofen (ADVIL,MOTRIN) 200 MG tablet Take 800 mg by mouth every 6 (six) hours as needed.  .  lovastatin (MEVACOR) 20 MG tablet Take 20 mg by mouth at bedtime.   . Melatonin 3-2 MG TABS Take by mouth.  . metFORMIN (GLUCOPHAGE) 1000 MG tablet Take 1,000 mg by mouth 2 (two) times daily with a meal.   . Multiple Vitamin (MULTIVITAMIN) tablet Take 1 tablet by mouth daily.  . Omega-3 Fatty Acids (OMEGA-3 FISH OIL) 1200 MG CAPS Take by mouth.  . potassium chloride (KLOR-CON) 20 MEQ packet Take 20 mEq by mouth as needed. Take with Torsemide  . torsemide (DEMADEX) 100 MG tablet Take 1 tablet (100 mg total) by mouth as needed.  . triamcinolone cream (KENALOG) 0.1 % Apply 1 application topically 3 (three) times daily.  Marland Kitchen FLUZONE HIGH-DOSE 0.5 ML injection   . nitroGLYCERIN (NITROSTAT) 0.4 MG SL tablet Place 1 tablet (0.4 mg total) under the tongue every 5 (five) minutes as needed for chest pain. (Patient not taking: Reported on 03/21/2017)  . potassium chloride SA (K-DUR,KLOR-CON) 20 MEQ tablet Take 1 tablet (20 mEq total) by mouth daily. (Patient not taking: Reported on 08/08/2017)  . [EXPIRED] cloNIDine (CATAPRES) tablet 0.1 mg    No facility-administered encounter medications on file as of 08/08/2017.     Allergies (verified) Patient has no known allergies.   History: Past Medical History:  Diagnosis Date  . Diabetes  mellitus without complication ([place])   . Hyperlipidemia 02/16/2017  . Hypertension    Past Surgical History:  Procedure Laterality Date  . CARDIAC CATHETERIZATION  2013  . CATARACT EXTRACTION Bilateral 04/21/2008   R eye 05-12-08   History reviewed. No pertinent family history. Social History   Socioeconomic History  . Marital status: Divorced    Spouse name: Not on file  . Number of children: Not on file  . Years of education: Graduate  . Highest education level: Not on file  Occupational History  . Not on file  Social Needs  . Financial resource strain: Not hard at all  . Food insecurity:    Worry: Never true    Inability: Never true  . Transportation needs:     Medical: No    Non-medical: No  Tobacco Use  . Smoking status: Never Smoker  . Smokeless tobacco: Never Used  Substance and Sexual Activity  . Alcohol use: No    Comment: wine occasionally  . Drug use: No  . Sexual activity: Not on file  Lifestyle  . Physical activity:    Days per week: 3 days    Minutes per session: 20 min  . Stress: Not at all  Relationships  . Social connections:    Talks on phone: More than three times a week    Gets together: More than three times a week    Attends religious service: More than 4 times per year    Active member of club or organization: No    Attends meetings of clubs or organizations: Never    Relationship status: Divorced  Other Topics Concern  . Not on file  Social History Narrative   Works part time Multimedia programmer in fall and spring      [employer]    Tobacco Counseling Counseling given: Not Answered   Clinical Intake:  Pre-visit preparation completed: Yes  Pain : 0-10 Pain Score: 0-No pain Pain Type: Chronic pain Pain Location: Knee Pain Orientation: Right Pain Descriptors / Indicators: Throbbing Pain Onset: More than a month ago Pain Frequency: Intermittent     Nutritional Status: BMI > 30  Obese Nutritional Risks: None Diabetes: Yes CBG done?: No Did pt. bring in CBG monitor from home?: No  How often do you need to have someone help you when you read instructions, pamphlets, or other written materials from your doctor or pharmacy?: 1 - Never What is the last grade level you completed in school?: masters   Interpreter Needed?: No  Information entered by :: Lark Langenfeld,LPN   Activities of Daily Living In your present state of health, do you have any difficulty performing the following activities: 08/08/2017 12/20/2016  Hearing? N N  Vision? N -  Difficulty concentrating or making decisions? N -  Walking or climbing stairs? Y -  Comment SOB  -  Dressing or bathing? N -  Doing errands, shopping? N  -  Preparing Food and eating ? N -  Using the Toilet? N -  In the past six months, have you accidently leaked urine? N -  Do you have problems with loss of bowel control? N -  Managing your Medications? N -  Managing your Finances? N -  Housekeeping or managing your Housekeeping? N -  Some recent data might be hidden     Immunizations and Health Maintenance Immunization History  Administered Date(s) Administered  . Influenza-Unspecified 10/28/2015, 11/21/2016  . Pneumococcal Conjugate-13 12/14/2013  . Pneumococcal-Unspecified 10/08/2012, 10/28/2015  .  Td 10/27/2010  . Zoster Recombinat (Shingrix) 10/08/2012   Health Maintenance Due  Topic Date Due  . FOOT EXAM  06/04/1950    Patient Care Team: Olin Hauser, DO as PCP - General (Family Medicine)  Indicate any recent Medical Services you may have received from other than [facility] providers in the past year (date may be approximate).    Assessment:   This is a routine wellness examination for Robert Daniel.  Hearing/Vision screen Vision Screening Comments: Sees Dr.bell annually  Dietary issues and exercise activities discussed: Current Exercise Habits: Home exercise routine, Time (Minutes): 25, Frequency (Times/Week): 3, Weekly Exercise (Minutes/Week): 75, Exercise limited by: None identified  Goals    . DIET - INCREASE WATER INTAKE     Recommend continue drinking at least 6-8 glasses of water a day       Depression Screen PHQ 2/9 Scores 12/20/2016  PHQ - 2 Score 0    Fall Risk Fall Risk  08/08/2017 12/20/2016  Falls in the past year? No No    Is the patient's home free of loose throw rugs in walkways, pet beds, electrical cords, etc?   no      Grab bars in the bathroom? no      Handrails on the stairs?   no      Adequate lighting?   yes  Timed Get Up and Go performed: Completed in 8 seconds with no use of assistive devices, steady gait. No intervention needed at this time.   Cognitive Function:     6CIT  Screen 08/08/2017  What Year? 0 points  What month? 0 points  What time? 0 points  Count back from 20 0 points  Months in reverse 0 points  Repeat phrase 2 points  Total Score 2    Screening Tests Health Maintenance  Topic Date Due  . FOOT EXAM  06/04/1950  . HEMOGLOBIN A1C  09/11/2017  . INFLUENZA VACCINE  10/05/2017  . OPHTHALMOLOGY EXAM  12/06/2017  . TETANUS/TDAP  10/26/2020    Qualifies for Shingles Vaccine? Yes, discussed shingrix vaccine   Cancer Screenings: Lung: Low Dose CT Chest recommended if Age 93-80 years, 30 pack-year currently smoking OR have quit w/in 15years. Patient does not qualify. Colorectal: no longer required  Additional Screenings:  Hepatitis C Screening: not indicated      Plan:    I have personally reviewed and addressed the Medicare Annual Wellness questionnaire and have noted the following in the patient's chart:  A. Medical and social history B. Use of alcohol, tobacco or illicit drugs  C. Current medications and supplements D. Functional ability and status E.  Nutritional status F.  Physical activity G. Advance directives H. List of other physicians I.  Hospitalizations, surgeries, and ER visits in previous 12 months J.  [place] such as hearing and vision if needed, cognitive and depression L. Referrals and appointments   In addition, I have reviewed and discussed with patient certain preventive protocols, quality metrics, and best practice recommendations. A written personalized care plan for preventive services as well as general preventive health recommendations were provided to patient.   Signed,  Tyler Aas, LPN Nurse Health Advisor   Nurse Notes: requests handicap sticker for increased SOB, CPE on 08/15/2017   Due for diabetic foot exam, cpe on 08/15/2017  BP 217/95   Rechecked after 5 minutes 228/93, verbal order from [place] for clonidine 0.1mg  and recheck after 10 minutes.  Rechecked at 239/93-  Dr.Karamalegos into see patient before discharge.

## 2017-08-08 NOTE — Patient Instructions (Addendum)
Robert Daniel , Thank you for taking time to come for your Medicare Wellness Visit. I appreciate your ongoing commitment to your health goals. Please review the following plan we discussed and let me know if I can assist you in the future.   Screening recommendations/referrals: Colonoscopy: no longer required Recommended yearly ophthalmology/optometry visit for glaucoma screening and checkup Recommended yearly dental visit for hygiene and checkup  Vaccinations: Influenza vaccine: due 11/2017 Pneumococcal vaccine: up to date Tdap vaccine: up to date Shingles vaccine:completed, please bring a copy of of your records  Advanced directives: Advance directive discussed with you today. I have provided a copy for you to complete at home and have notarized. Once this is complete please bring a copy in to our office so we can scan it into your chart.  Conditions/risks identified: Recommend continue drinking at least 6-8 glasses of water a day   Next appointment: Follow up on 08/15/2017 at 10:00am with Dr.Karamalegos. Follow up in one year for your annual wellness exam.  Preventive Care 65 Years and Older, Male Preventive care refers to lifestyle choices and visits with your health care provider that can promote health and wellness. What does preventive care include?  A yearly physical exam. This is also called an annual well check.  Dental exams once or twice a year.  Routine eye exams. Ask your health care provider how often you should have your eyes checked.  Personal lifestyle choices, including:  Daily care of your teeth and gums.  Regular physical activity.  Eating a healthy diet.  Avoiding tobacco and drug use.  Limiting alcohol use.  Practicing safe sex.  Taking low doses of aspirin every day.  Taking vitamin and mineral supplements as recommended by your health care provider. What happens during an annual well check? The services and screenings done by your health care  provider during your annual well check will depend on your age, overall health, lifestyle risk factors, and family history of disease. Counseling  Your health care provider may ask you questions about your:  Alcohol use.  Tobacco use.  Drug use.  Emotional well-being.  Home and relationship well-being.  Sexual activity.  Eating habits.  History of falls.  Memory and ability to understand (cognition).  Work and work Statistician. Screening  You may have the following tests or measurements:  Height, weight, and BMI.  Blood pressure.  Lipid and cholesterol levels. These may be checked every 5 years, or more frequently if you are over 108 years old.  Skin check.  Lung cancer screening. You may have this screening every year starting at age 36 if you have a 30-pack-year history of smoking and currently smoke or have quit within the past 15 years.  Fecal occult blood test (FOBT) of the stool. You may have this test every year starting at age 58.  Flexible sigmoidoscopy or colonoscopy. You may have a sigmoidoscopy every 5 years or a colonoscopy every 10 years starting at age 60.  Prostate cancer screening. Recommendations will vary depending on your family history and other risks.  Hepatitis C blood test.  Hepatitis B blood test.  Sexually transmitted disease (STD) testing.  Diabetes screening. This is done by checking your blood sugar (glucose) after you have not eaten for a while (fasting). You may have this done every 1-3 years.  Abdominal aortic aneurysm (AAA) screening. You may need this if you are a current or former smoker.  Osteoporosis. You may be screened starting at age 4 if you are  at high risk. Talk with your health care provider about your test results, treatment options, and if necessary, the need for more tests. Vaccines  Your health care provider may recommend certain vaccines, such as:  Influenza vaccine. This is recommended every year.  Tetanus,  diphtheria, and acellular pertussis (Tdap, Td) vaccine. You may need a Td booster every 10 years.  Zoster vaccine. You may need this after age 49.  Pneumococcal 13-valent conjugate (PCV13) vaccine. One dose is recommended after age 62.  Pneumococcal polysaccharide (PPSV23) vaccine. One dose is recommended after age 54. Talk to your health care provider about which screenings and vaccines you need and how often you need them. This information is not intended to replace advice given to you by your health care provider. Make sure you discuss any questions you have with your health care provider. Document Released: 03/20/2015 Document Revised: 11/11/2015 Document Reviewed: 12/23/2014 Elsevier Interactive Patient Education  2017 [employer] Prevention in the Home Falls can cause injuries. They can happen to people of all ages. There are many things you can do to make your home safe and to help prevent falls. What can I do on the outside of my home?  Regularly fix the edges of walkways and driveways and fix any cracks.  Remove anything that might make you trip as you walk through a door, such as a raised step or threshold.  Trim any bushes or trees on the path to your home.  Use bright outdoor lighting.  Clear any walking paths of anything that might make someone trip, such as rocks or tools.  Regularly check to see if handrails are loose or broken. Make sure that both sides of any steps have handrails.  Any raised decks and porches should have guardrails on the edges.  Have any leaves, snow, or ice cleared regularly.  Use sand or salt on walking paths during winter.  Clean up any spills in your garage right away. This includes oil or grease spills. What can I do in the bathroom?  Use night lights.  Install grab bars by the toilet and in the tub and shower. Do not use towel bars as grab bars.  Use non-skid mats or decals in the tub or shower.  If you need to sit down in  the shower, use a plastic, non-slip stool.  Keep the floor dry. Clean up any water that spills on the floor as soon as it happens.  Remove soap buildup in the tub or shower regularly.  Attach bath mats securely with double-sided non-slip rug tape.  Do not have throw rugs and other things on the floor that can make you trip. What can I do in the bedroom?  Use night lights.  Make sure that you have a light by your bed that is easy to reach.  Do not use any sheets or blankets that are too big for your bed. They should not hang down onto the floor.  Have a firm chair that has side arms. You can use this for support while you get dressed.  Do not have throw rugs and other things on the floor that can make you trip. What can I do in the kitchen?  Clean up any spills right away.  Avoid walking on wet floors.  Keep items that you use a lot in easy-to-reach places.  If you need to reach something above you, use a strong step stool that has a grab bar.  Keep electrical cords out of  the way.  Do not use floor polish or wax that makes floors slippery. If you must use wax, use non-skid floor wax.  Do not have throw rugs and other things on the floor that can make you trip. What can I do with my stairs?  Do not leave any items on the stairs.  Make sure that there are handrails on both sides of the stairs and use them. Fix handrails that are broken or loose. Make sure that handrails are as long as the stairways.  Check any carpeting to make sure that it is firmly attached to the stairs. Fix any carpet that is loose or worn.  Avoid having throw rugs at the top or bottom of the stairs. If you do have throw rugs, attach them to the floor with carpet tape.  Make sure that you have a light switch at the top of the stairs and the bottom of the stairs. If you do not have them, ask someone to add them for you. What else can I do to help prevent falls?  Wear shoes that:  Do not have high  heels.  Have rubber bottoms.  Are comfortable and fit you well.  Are closed at the toe. Do not wear sandals.  If you use a stepladder:  Make sure that it is fully opened. Do not climb a closed stepladder.  Make sure that both sides of the stepladder are locked into place.  Ask someone to hold it for you, if possible.  Clearly mark and make sure that you can see:  Any grab bars or handrails.  First and last steps.  Where the edge of each step is.  Use tools that help you move around (mobility aids) if they are needed. These include:  Canes.  Walkers.  Scooters.  Crutches.  Turn on the lights when you go into a dark area. Replace any light bulbs as soon as they burn out.  Set up your furniture so you have a clear path. Avoid moving your furniture around.  If any of your floors are uneven, fix them.  If there are any pets around you, be aware of where they are.  Review your medicines with your doctor. Some medicines can make you feel dizzy. This can increase your chance of falling. Ask your doctor what other things that you can do to help prevent falls. This information is not intended to replace advice given to you by your health care provider. Make sure you discuss any questions you have with your health care provider. Document Released: 12/18/2008 Document Revised: 07/30/2015 Document Reviewed: 03/28/2014 Elsevier Interactive Patient Education  2017 [employer].

## 2017-08-09 LAB — LIPID PANEL
CHOL/HDL RATIO: 6.7 (calc) — AB (ref <5.0)
CHOLESTEROL: 202 mg/dL — AB (ref <200)
HDL: 30 mg/dL — AB (ref >40)
LDL Cholesterol (Calc): 130 mg/dL (calc) — ABNORMAL HIGH
NON-HDL CHOLESTEROL (CALC): 172 mg/dL — AB (ref <130)
TRIGLYCERIDES: 275 mg/dL — AB (ref <150)

## 2017-08-09 LAB — CBC WITH DIFFERENTIAL/PLATELET
BASOS PCT: 0.8 %
Basophils Absolute: 51 cells/uL (ref 0–200)
EOS PCT: 7.4 %
Eosinophils Absolute: 474 cells/uL (ref 15–500)
HEMATOCRIT: 38.7 % (ref 38.5–50.0)
HEMOGLOBIN: 13 g/dL — AB (ref 13.2–17.1)
LYMPHS ABS: 2317 {cells}/uL (ref 850–3900)
MCH: 28.8 pg (ref 27.0–33.0)
MCHC: 33.6 g/dL (ref 32.0–36.0)
MCV: 85.8 fL (ref 80.0–100.0)
MONOS PCT: 9.1 %
MPV: 13.5 fL — AB (ref 7.5–12.5)
NEUTROS ABS: 2976 {cells}/uL (ref 1500–7800)
Neutrophils Relative %: 46.5 %
Platelets: 142 10*3/uL (ref 140–400)
RBC: 4.51 10*6/uL (ref 4.20–5.80)
RDW: 13.2 % (ref 11.0–15.0)
Total Lymphocyte: 36.2 %
WBC mixed population: 582 cells/uL (ref 200–950)
WBC: 6.4 10*3/uL (ref 3.8–10.8)

## 2017-08-09 LAB — COMPLETE METABOLIC PANEL WITH GFR
AG Ratio: 2 (calc) (ref 1.0–2.5)
ALT: 18 U/L (ref 9–46)
AST: 18 U/L (ref 10–35)
Albumin: 4.7 g/dL (ref 3.6–5.1)
Alkaline phosphatase (APISO): 31 U/L — ABNORMAL LOW (ref 40–115)
BILIRUBIN TOTAL: 0.4 mg/dL (ref 0.2–1.2)
BUN/Creatinine Ratio: 23 (calc) — ABNORMAL HIGH (ref 6–22)
BUN: 29 mg/dL — ABNORMAL HIGH (ref 7–25)
CHLORIDE: 105 mmol/L (ref 98–110)
CO2: 26 mmol/L (ref 20–32)
Calcium: 10 mg/dL (ref 8.6–10.3)
Creat: 1.27 mg/dL — ABNORMAL HIGH (ref 0.70–1.18)
GFR, EST AFRICAN AMERICAN: 62 mL/min/{1.73_m2} (ref >=60)
GFR, EST NON AFRICAN AMERICAN: 54 mL/min/{1.73_m2} — AB (ref >=60)
GLOBULIN: 2.4 g/dL (ref 1.9–3.7)
Glucose, Bld: 155 mg/dL — ABNORMAL HIGH (ref 65–99)
POTASSIUM: 5 mmol/L (ref 3.5–5.3)
SODIUM: 142 mmol/L (ref 135–146)
Total Protein: 7.1 g/dL (ref 6.1–8.1)

## 2017-08-09 LAB — HEMOGLOBIN A1C
EAG (MMOL/L): 8.9 (calc)
HEMOGLOBIN A1C: 7.2 %{Hb} — AB (ref <5.7)
MEAN PLASMA GLUCOSE: 160 (calc)

## 2017-08-09 LAB — PSA, TOTAL WITH REFLEX TO PSA, FREE: PSA, Total: 1.5 ng/mL (ref <=4.0)

## 2017-08-10 ENCOUNTER — Encounter: Payer: Self-pay | Admitting: Family Medicine

## 2017-08-15 ENCOUNTER — Other Ambulatory Visit: Payer: Self-pay

## 2017-08-15 ENCOUNTER — Encounter: Payer: Self-pay | Admitting: Family Medicine

## 2017-08-15 ENCOUNTER — Ambulatory Visit (INDEPENDENT_AMBULATORY_CARE_PROVIDER_SITE_OTHER): Payer: Medicare HMO | Admitting: Family Medicine

## 2017-08-15 VITALS — BP 189/69 | HR 80 | Temp 98.2°F | Ht 75.0 in | Wt 283.0 lb

## 2017-08-15 DIAGNOSIS — Z0000 Encounter for general adult medical examination without abnormal findings: Secondary | ICD-10-CM

## 2017-08-15 DIAGNOSIS — M1711 Unilateral primary osteoarthritis, right knee: Secondary | ICD-10-CM

## 2017-08-15 DIAGNOSIS — E1169 Type 2 diabetes mellitus with other specified complication: Secondary | ICD-10-CM

## 2017-08-15 DIAGNOSIS — I10 Essential (primary) hypertension: Secondary | ICD-10-CM

## 2017-08-15 DIAGNOSIS — E785 Hyperlipidemia, unspecified: Secondary | ICD-10-CM | POA: Diagnosis not present

## 2017-08-15 DIAGNOSIS — E1121 Type 2 diabetes mellitus with diabetic nephropathy: Secondary | ICD-10-CM

## 2017-08-15 MED ORDER — CLONIDINE HCL 0.2 MG PO TABS: 0.2000 mg | ORAL_TABLET | Freq: Four times a day (QID) | ORAL | 3 refills | Status: DC

## 2017-08-15 NOTE — Patient Instructions (Addendum)
Thank you for coming to the office today.  A1c improved down to 7.2, keep up the good work  I think dietary is most important plan to goal lose weight and control fluid balance.  Feet overall today good sensation if you need a foot specialist let me know - [place]  We may contact Cardiology to see if they can perform - ABI - lower extremity circulation test - that is recommended by your insurance for screening.  Please schedule a Follow-up Appointment to: Return in about 6 months (around 02/14/2018) for DM A1c, HTN, Weight/Edema, Knee.  If you have any other questions or concerns, please feel free to call the office or send a message through [facility]. You may also schedule an earlier appointment if necessary.  Additionally, you may be receiving a survey about your experience at our office within a few days to 1 week by e-mail or mail. We value your feedback.  Nobie Putnam, DO [place]

## 2017-08-15 NOTE — Progress Notes (Signed)
Subjective:    Patient ID: Robert Daniel, male    DOB: 1940/05/06, 77 y.o.   MRN: [id number]  [name] is a 77 y.o. male presenting on 08/15/2017 for Annual Exam   HPI   Here for Annual Physical and Lab Review.  RESISTANT HTN / Lower Extremity Edema / CKD-III Reports last visit he had elevated BP it improved later upon taking clonidine, home reading significantly improved but still elevated on average. His best readings usually around 160/80s - Last visit he was increased a week ago on Clonidine from 0.2mg  TID up to 0.2mg  QID with improvement Current Meds - Benazepril 40mg  daily, Hydralazine 100mg  QID, Clonidine 0.2mg  QID - Tosemide 100mg  PRN with K - improves edema with this PRN takes occasionally about 1 x weekly or so, he wears compression as well with relief Reports good compliance, took meds today. Tolerating well, w/o complaints. Lifestyle: Weight improved down 5-6 lbs, limited all dietary habits now, less fried chicken and salt Now he is motivated to keep losing weight Return to [facility] with exercise, now R knee improved  CHRONIC DM, Type 2 with Nephropathy CKD-III Reports no concerns. Pleased with improve A1c Meds: Glimepiride 8mg  daily (x 2 of the 4mg  pill), Metformin 1000mg  BID Reports good compliance. Tolerating well w/o side-effects Currently on ACEi - No history of neuropathy or numbness tingling Denies hypoglycemia  Health Maintenance: Due Shringrix waiting list for 1st dose at pharmacy  Depression screen [facility] 2/9 12/20/2016  Decreased Interest 0  Down, Depressed, Hopeless 0  PHQ - 2 Score 0    Past Medical History:  Diagnosis Date  . Hyperlipidemia 02/16/2017  . Hypertension    Past Surgical History:  Procedure Laterality Date  . CARDIAC CATHETERIZATION  2013  . CATARACT EXTRACTION Bilateral 04/21/2008   R eye 05-12-08   Social History   Socioeconomic History  . Marital status: Divorced    Spouse name: Not on file  . Number of children: Not on  file  . Years of education: Graduate  . Highest education level: Not on file  Occupational History  . Not on file  Social Needs  . Financial resource strain: Not hard at all  . Food insecurity:    Worry: Never true    Inability: Never true  . Transportation needs:    Medical: No    Non-medical: No  Tobacco Use  . Smoking status: Never Smoker  . Smokeless tobacco: Never Used  Substance and Sexual Activity  . Alcohol use: No    Comment: wine occasionally  . Drug use: No  . Sexual activity: Not on file  Lifestyle  . Physical activity:    Days per week: 3 days    Minutes per session: 20 min  . Stress: Not at all  Relationships  . Social connections:    Talks on phone: More than three times a week    Gets together: More than three times a week    Attends religious service: More than 4 times per year    Active member of club or organization: No    Attends meetings of clubs or organizations: Never    Relationship status: Divorced  . Intimate partner violence:    Fear of current or ex partner: No    Emotionally abused: No    Physically abused: No    Forced sexual activity: No  Other Topics Concern  . Not on file  Social History Narrative   Works part time Multimedia programmer in fall and  spring      [employer]    No family history on file. Current Outpatient Medications on File Prior to Visit  Medication Sig  . acetaminophen (TYLENOL) 500 MG tablet Take 500 mg by mouth every 6 (six) hours as needed.  . Ascorbic Acid (VITAMIN C) 1000 MG tablet Take 1,000 mg by mouth daily.  Marland Kitchen aspirin EC 81 MG tablet Take 81 mg by mouth daily.  . benazepril (LOTENSIN) 40 MG tablet Take 1 tablet (40 mg total) by mouth daily.  . calcium citrate-vitamin D (CALCIUM + D) 315-200 MG-UNIT tablet Take 1 tablet by mouth once.  Marland Kitchen co-enzyme Q-10 30 MG capsule Take 30 mg by mouth 3 (three) times daily.  . Cyanocobalamin (VITAMIN B-12) 5000 MCG TBDP Take by mouth.  . Fenugreek 610 MG CAPS Take  by mouth.  Marland Kitchen gemfibrozil (LOPID) 600 MG tablet Take 600 mg by mouth 2 (two) times daily before a meal.   . glimepiride (AMARYL) 4 MG tablet Take 2 tablets (8 mg total) by mouth daily with breakfast.  . hydrALAZINE (APRESOLINE) 100 MG tablet Take 1 tablet (100 mg total) by mouth 4 (four) times daily.  Marland Kitchen ibuprofen (ADVIL,MOTRIN) 200 MG tablet Take 800 mg by mouth every 6 (six) hours as needed.  . lovastatin (MEVACOR) 20 MG tablet Take 20 mg by mouth at bedtime.   . Melatonin 3-2 MG TABS Take by mouth.  . metFORMIN (GLUCOPHAGE) 1000 MG tablet Take 1,000 mg by mouth 2 (two) times daily with a meal.   . Multiple Vitamin (MULTIVITAMIN) tablet Take 1 tablet by mouth daily.  . Omega-3 Fatty Acids (OMEGA-3 FISH OIL) 1200 MG CAPS Take by mouth.  . potassium chloride (KLOR-CON) 20 MEQ packet Take 20 mEq by mouth as needed. Take with Torsemide  . torsemide (DEMADEX) 100 MG tablet Take 1 tablet (100 mg total) by mouth as needed.  . triamcinolone cream (KENALOG) 0.1 % Apply 1 application topically 3 (three) times daily.  . Turmeric 500 MG TABS Take by mouth.  Marland Kitchen FLUZONE HIGH-DOSE 0.5 ML injection   . nitroGLYCERIN (NITROSTAT) 0.4 MG SL tablet Place 1 tablet (0.4 mg total) under the tongue every 5 (five) minutes as needed for chest pain. (Patient not taking: Reported on 03/21/2017)  . potassium chloride SA (K-DUR,KLOR-CON) 20 MEQ tablet Take 1 tablet (20 mEq total) by mouth daily. (Patient not taking: Reported on 08/08/2017)   No current facility-administered medications on file prior to visit.     Review of Systems  Constitutional: Negative for activity change, appetite change, chills, diaphoresis, fatigue and fever.  HENT: Negative for congestion and hearing loss.   Eyes: Negative for visual disturbance.  Respiratory: Negative for apnea, cough, chest tightness, shortness of breath and wheezing.   Cardiovascular: Positive for leg swelling (improved). Negative for chest pain and palpitations.    Gastrointestinal: Negative for abdominal pain, anal bleeding, blood in stool, constipation, diarrhea, nausea and vomiting.  Endocrine: Negative for cold intolerance.  Genitourinary: Negative for decreased urine volume, difficulty urinating, dysuria, frequency, hematuria, scrotal swelling, testicular pain and urgency.  Musculoskeletal: Positive for arthralgias (knees, improved). Negative for back pain and neck pain.  Skin: Negative for rash.  Allergic/Immunologic: Negative for environmental allergies.  Neurological: Negative for dizziness, weakness, light-headedness, numbness and headaches.  Hematological: Negative for adenopathy.  Psychiatric/Behavioral: Negative for behavioral problems, dysphoric mood and sleep disturbance. The patient is not nervous/anxious.    Per HPI unless specifically indicated above     Objective:    BP Marland Kitchen)  189/69 (BP Location: Left Arm, Patient Position: Sitting, Cuff Size: Large)   Pulse 80   Temp 98.2 F (36.8 C) (Oral)   Ht 6\' 3"  (1.905 m)   Wt 283 lb (128.4 kg)   BMI 35.37 kg/m   Wt Readings from Last 3 Encounters:  08/15/17 283 lb (128.4 kg)  08/08/17 288 lb 12.8 oz (131 kg)  05/12/17 276 lb (125.2 kg)    Physical Exam  Constitutional: He is oriented to person, place, and time. He appears well-developed and well-nourished. No distress.  Well-appearing, comfortable, cooperative  HENT:  Head: Normocephalic and atraumatic.  Mouth/Throat: Oropharynx is clear and moist.  Eyes: Conjunctivae are normal. Right eye exhibits no discharge. Left eye exhibits no discharge.  Neck: Normal range of motion. Neck supple. No thyromegaly present.  Cardiovascular: Normal rate, regular rhythm, normal heart sounds and intact distal pulses.  No murmur heard. Pulmonary/Chest: Effort normal and breath sounds normal. No respiratory distress. He has no wheezes. He has no rales.  Abdominal: Soft. Bowel sounds are normal. He exhibits no distension. There is no tenderness.   Musculoskeletal: Normal range of motion. He exhibits no edema (Resolved now lower extremity edema).  Lymphadenopathy:    He has no cervical adenopathy.  Neurological: He is alert and oriented to person, place, and time.  Skin: Skin is warm and dry. No rash noted. He is not diaphoretic. No erythema.  Psychiatric: He has a normal mood and affect. His behavior is normal.  Well groomed, good eye contact, normal speech and thoughts. Not anxious appearing today  Nursing note and vitals reviewed.    Diabetic Foot Exam - Simple   Simple Foot Form Diabetic Foot exam was performed with the following findings:  Yes 08/15/2017 10:37 AM  Visual Inspection See comments:  Yes Sensation Testing See comments:  Yes Pulse Check Posterior Tibialis and Dorsalis pulse intact bilaterally:  Yes Comments Mild reduced monofilament sensation bilateral forefoot medial, otherwise generally intact. Right foot great toe large bunion with deformity, some dry skin generally with early callus formation heel.     Results for orders placed or performed in visit on 08/08/17  PSA, Total with Reflex to PSA, Free  Result Value Ref Range   PSA, Total 1.5 < OR = 4.0 ng/mL  CBC with Differential/Platelet  Result Value Ref Range   WBC 6.4 3.8 - 10.8 Thousand/uL   RBC 4.51 4.20 - 5.80 Million/uL   Hemoglobin 13.0 (L) 13.2 - 17.1 g/dL   HCT 38.7 38.5 - 50.0 %   MCV 85.8 80.0 - 100.0 fL   MCH 28.8 27.0 - 33.0 pg   MCHC 33.6 32.0 - 36.0 g/dL   RDW 13.2 11.0 - 15.0 %   Platelets 142 140 - 400 Thousand/uL   MPV 13.5 (H) 7.5 - 12.5 fL   Neutro Abs 2,976 1,500 - 7,800 cells/uL   Lymphs Abs 2,317 850 - 3,900 cells/uL   WBC mixed population 582 200 - 950 cells/uL   Eosinophils Absolute 474 15 - 500 cells/uL   Basophils Absolute 51 0 - 200 cells/uL   Neutrophils Relative % 46.5 %   Total Lymphocyte 36.2 %   Monocytes Relative 9.1 %   Eosinophils Relative 7.4 %   Basophils Relative 0.8 %  Lipid panel  Result Value Ref  Range   Cholesterol 202 (H) <200 mg/dL   HDL 30 (L) >40 mg/dL   Triglycerides 275 (H) <150 mg/dL   LDL Cholesterol (Calc) 130 (H) mg/dL (calc)   Total CHOL/HDL  Ratio 6.7 (H) <5.0 (calc)   Non-HDL Cholesterol (Calc) 172 (H) <130 mg/dL (calc)  Hemoglobin A1c  Result Value Ref Range   Hgb A1c MFr Bld 7.2 (H) <5.7 % of total Hgb   Mean Plasma Glucose 160 (calc)   eAG (mmol/L) 8.9 (calc)  COMPLETE METABOLIC PANEL WITH GFR  Result Value Ref Range   Glucose, Bld 155 (H) 65 - 99 mg/dL   BUN 29 (H) 7 - 25 mg/dL   Creat 1.27 (H) 0.70 - 1.18 mg/dL   GFR, Est Non African American 54 (L) > OR = 60 mL/min/1.62m2   GFR, Est African American 62 > OR = 60 mL/min/1.62m2   BUN/Creatinine Ratio 23 (H) 6 - 22 (calc)   Sodium 142 135 - 146 mmol/L   Potassium 5.0 3.5 - 5.3 mmol/L   Chloride 105 98 - 110 mmol/L   CO2 26 20 - 32 mmol/L   Calcium 10.0 8.6 - 10.3 mg/dL   Total Protein 7.1 6.1 - 8.1 g/dL   Albumin 4.7 3.6 - 5.1 g/dL   Globulin 2.4 1.9 - 3.7 g/dL (calc)   AG Ratio 2.0 1.0 - 2.5 (calc)   Total Bilirubin 0.4 0.2 - 1.2 mg/dL   Alkaline phosphatase (APISO) 31 (L) 40 - 115 U/L   AST 18 10 - 35 U/L   ALT 18 9 - 46 U/L      Assessment & Plan:   Problem List Items Addressed This Visit    Hyperlipidemia associated with type 2 diabetes mellitus ([place])    Uncontrolled cholesterol on statin, and some poor lifestyle Last lipid panel 08/2017 Calculated ASCVD 10 yr risk score elevated risk w/ DM  Plan: 1. Continue current meds - Lovastatin 20mg  daily, Gemfibrozil 600mg  BID 2. Continue ASA 81mg  for primary ASCVD risk reduction 3. Encourage improved lifestyle - low carb/cholesterol, reduce portion size, continue improving regular exercise      Relevant Medications   cloNIDine (CATAPRES) 0.2 MG tablet   Osteoarthritis of right knee   Resistant hypertension    Clinically asymptomatic today, has chronic uncontrolled HTN does not seem responding appropriately to therapy, often seems more anxious  related and white coat HTN related, with variable BP - Home avg 140-150/60-80s variable readings - No known complications - Prior work-up done by Vascular and Renal with negative secondary HTN work-up, negative RAS. Recent work-up negative Hyperaldosterone labs, TSH - Failed: Bystolic, Metoprolol, HCTZ, Diovan, Amlodipine - Followed by [facility]  Plan:  1. Continue current BP regimen- continue Benazepril 40mg , Clonidine 0.2mg  QID (seems improved on higher dose), Hydralazine 100mg  QID - torsemide PRN 2. Encourage improved lifestyle - low sodium diet, regular exercise 3. Continue monitor BP outside office, bring readings to next visit for review 4. Follow-up with Cardiology for further work-up and management, as I have limited options left - now may pursue possible OSA - not interested at this time  Additionally annual screening document from insurance request PAD work-up, contacted his Cardiology [facility] to consider if they can perform ABI for him to meet this screening requirement  Strict return precautions given to patient when to go to hospital ED if worsening symptoms with BP      Relevant Medications   cloNIDine (CATAPRES) 0.2 MG tablet   Type 2 diabetes mellitus with nephropathy ([place])    Well-controlled DM with A1c 7.2, improved No hypoglycemia or significant hyperglycemia Complications - CKD-III in setting of HTN as well, and other including hyperlipidemia - increases risk of future cardiovascular complications  Plan:  1. Continue current therapy - Metformin 1000mg  BID, Glimepiride 4mg  BID vs 8mg  daily 2. Encourage improved lifestyle - low carb, low sugar diet, reduce portion size, continue improving regular exercise 3. Check CBG, bring log to next visit for review 4. Continue ASA, ACEi, Statin 5. DM Foot exam done today / Advised to schedule DM ophtho exam, send record 6. Follow-up 6 months DM A1c      White coat syndrome with hypertension   Relevant Medications    cloNIDine (CATAPRES) 0.2 MG tablet    Other Visit Diagnoses    Annual physical exam    -  Primary    Updated health maintenance - recommended PAD screening per Cardiology ABI (as per [place] form - will update diagnoses and fax back to insurance as requested) Reviewed lab results Encourage improve diet exercise lifestyle weight loss goal to help control BP    Meds ordered this encounter  Medications  . cloNIDine (CATAPRES) 0.2 MG tablet    Sig: Take 1 tablet (0.2 mg total) by mouth 4 (four) times daily.    Dispense:  360 tablet    Refill:  3    Frequency increase from 3 times daily to 4 times daily    Follow up plan: Return in about 6 months (around 02/14/2018) for DM A1c, HTN, Weight/Edema, Knee.  Nobie Putnam, [facility] 08/16/2017, 12:19 AM

## 2017-08-16 NOTE — Assessment & Plan Note (Signed)
Uncontrolled cholesterol on statin, and some poor lifestyle Last lipid panel 08/2017 Calculated ASCVD 10 yr risk score elevated risk w/ DM  Plan: 1. Continue current meds - Lovastatin 20mg  daily, Gemfibrozil 600mg  BID 2. Continue ASA 81mg  for primary ASCVD risk reduction 3. Encourage improved lifestyle - low carb/cholesterol, reduce portion size, continue improving regular exercise

## 2017-08-16 NOTE — Assessment & Plan Note (Signed)
Clinically asymptomatic today, has chronic uncontrolled HTN does not seem responding appropriately to therapy, often seems more anxious related and white coat HTN related, with variable BP - Home avg 140-150/60-80s variable readings - No known complications - Prior work-up done by Vascular and Renal with negative secondary HTN work-up, negative RAS. Recent work-up negative Hyperaldosterone labs, TSH - Failed: Bystolic, Metoprolol, HCTZ, Diovan, Amlodipine - Followed by [facility]  Plan:  1. Continue current BP regimen- continue Benazepril 40mg , Clonidine 0.2mg  QID (seems improved on higher dose), Hydralazine 100mg  QID - torsemide PRN 2. Encourage improved lifestyle - low sodium diet, regular exercise 3. Continue monitor BP outside office, bring readings to next visit for review 4. Follow-up with Cardiology for further work-up and management, as I have limited options left - now may pursue possible OSA - not interested at this time  Additionally annual screening document from insurance request PAD work-up, contacted his Cardiology [facility] to consider if they can perform ABI for him to meet this screening requirement  Strict return precautions given to patient when to go to hospital ED if worsening symptoms with BP

## 2017-08-16 NOTE — Assessment & Plan Note (Signed)
Well-controlled DM with A1c 7.2, improved No hypoglycemia or significant hyperglycemia Complications - CKD-III in setting of HTN as well, and other including hyperlipidemia - increases risk of future cardiovascular complications  Plan:  1. Continue current therapy - Metformin 1000mg  BID, Glimepiride 4mg  BID vs 8mg  daily 2. Encourage improved lifestyle - low carb, low sugar diet, reduce portion size, continue improving regular exercise 3. Check CBG, bring log to next visit for review 4. Continue ASA, ACEi, Statin 5. DM Foot exam done today / Advised to schedule DM ophtho exam, send record 6. Follow-up 6 months DM A1c

## 2017-08-17 ENCOUNTER — Other Ambulatory Visit: Payer: Self-pay

## 2017-08-17 MED ORDER — POTASSIUM CHLORIDE 20 MEQ PO PACK: 20.0000 meq | PACK | ORAL | 1 refills | Status: DC | PRN

## 2017-08-25 DIAGNOSIS — E1121 Type 2 diabetes mellitus with diabetic nephropathy: Secondary | ICD-10-CM | POA: Diagnosis not present

## 2017-08-28 ENCOUNTER — Other Ambulatory Visit: Payer: Self-pay | Admitting: Family Medicine

## 2017-08-28 DIAGNOSIS — E1121 Type 2 diabetes mellitus with diabetic nephropathy: Secondary | ICD-10-CM

## 2017-08-28 DIAGNOSIS — I10 Essential (primary) hypertension: Secondary | ICD-10-CM

## 2017-09-13 NOTE — Addendum Note (Signed)
Addended by: Olin Hauser on: 09/13/2017 12:55 PM   Modules accepted: Orders

## 2017-11-01 DIAGNOSIS — R69 Illness, unspecified: Secondary | ICD-10-CM | POA: Diagnosis not present

## 2017-11-02 ENCOUNTER — Other Ambulatory Visit: Payer: Self-pay

## 2017-11-02 DIAGNOSIS — L308 Other specified dermatitis: Secondary | ICD-10-CM

## 2017-11-02 MED ORDER — TRIAMCINOLONE ACETONIDE 0.1 % EX CREA: 1.0000 "application " | TOPICAL_CREAM | Freq: Three times a day (TID) | CUTANEOUS | 1 refills | Status: DC

## 2017-11-08 ENCOUNTER — Other Ambulatory Visit: Payer: Self-pay | Admitting: Family Medicine

## 2017-11-08 DIAGNOSIS — L308 Other specified dermatitis: Secondary | ICD-10-CM

## 2017-11-08 MED ORDER — TRIAMCINOLONE ACETONIDE 0.1 % EX OINT: 1.0000 "application " | TOPICAL_OINTMENT | Freq: Two times a day (BID) | CUTANEOUS | 0 refills | Status: DC | PRN

## 2017-11-10 ENCOUNTER — Telehealth: Payer: Self-pay | Admitting: Family Medicine

## 2017-11-10 DIAGNOSIS — L308 Other specified dermatitis: Secondary | ICD-10-CM

## 2017-11-10 MED ORDER — TRIAMCINOLONE ACETONIDE 0.1 % EX CREA: 1.0000 "application " | TOPICAL_CREAM | Freq: Two times a day (BID) | CUTANEOUS | 1 refills | Status: DC | PRN

## 2017-11-10 NOTE — Telephone Encounter (Signed)
Left message for patient to call back  

## 2017-11-10 NOTE — Telephone Encounter (Signed)
Patient is on Triamcinolone 0.1% for eczema. He has been getting large quantity in jar filled. Previous rx was for cream, and now recently on 11/04/17 we received a fax from [facility] (it had prior PCP Dr Jerene Dilling name on it) but it stated that the patient requested a refill and he specifically requested rx for ointment instead of cream.  I changed his rx for Triamcinolone 0.1% ointment, disp 453.6g = 1 jar.  Now received phone call from patient that he prefers cream and not ointment. It seems that the fax request was incorrect.  Patient did not pick up ointment.  I called pharmacy [facility] spoke with pharmacist and confirmed that the prior instructions were from previous PCP.  We have discontinued the ointment. Now verbal authorization to change rx back to Triamcinolone 0.1% cream jar 453.6g BID PRN.  Nobie Putnam, DO [facility] 11/10/2017, 4:16 PM

## 2017-11-15 ENCOUNTER — Other Ambulatory Visit: Payer: Self-pay | Admitting: Family Medicine

## 2017-11-15 DIAGNOSIS — E119 Type 2 diabetes mellitus without complications: Secondary | ICD-10-CM

## 2018-01-11 ENCOUNTER — Other Ambulatory Visit: Payer: Self-pay | Admitting: Family Medicine

## 2018-01-11 MED ORDER — METFORMIN HCL 1000 MG PO TABS: 1000.0000 mg | ORAL_TABLET | Freq: Two times a day (BID) | ORAL | 1 refills | Status: DC

## 2018-01-17 DIAGNOSIS — E113393 Type 2 diabetes mellitus with moderate nonproliferative diabetic retinopathy without macular edema, bilateral: Secondary | ICD-10-CM | POA: Diagnosis not present

## 2018-01-31 ENCOUNTER — Other Ambulatory Visit: Payer: Self-pay

## 2018-01-31 DIAGNOSIS — E785 Hyperlipidemia, unspecified: Principal | ICD-10-CM

## 2018-01-31 DIAGNOSIS — E1169 Type 2 diabetes mellitus with other specified complication: Secondary | ICD-10-CM

## 2018-01-31 MED ORDER — GEMFIBROZIL 600 MG PO TABS: 600.0000 mg | ORAL_TABLET | Freq: Two times a day (BID) | ORAL | 1 refills | Status: DC

## 2018-02-14 ENCOUNTER — Ambulatory Visit (INDEPENDENT_AMBULATORY_CARE_PROVIDER_SITE_OTHER): Payer: Medicare HMO | Admitting: Family Medicine

## 2018-02-14 ENCOUNTER — Other Ambulatory Visit: Payer: Self-pay

## 2018-02-14 ENCOUNTER — Encounter: Payer: Self-pay | Admitting: Family Medicine

## 2018-02-14 VITALS — BP 194/90 | HR 86 | Temp 98.3°F | Resp 18 | Ht 75.0 in | Wt 284.4 lb

## 2018-02-14 DIAGNOSIS — E1121 Type 2 diabetes mellitus with diabetic nephropathy: Secondary | ICD-10-CM

## 2018-02-14 DIAGNOSIS — I10 Essential (primary) hypertension: Secondary | ICD-10-CM | POA: Diagnosis not present

## 2018-02-14 LAB — POCT GLYCOSYLATED HEMOGLOBIN (HGB A1C): Hemoglobin A1C: 7.9 % — AB (ref 4.0–5.6)

## 2018-02-14 NOTE — Progress Notes (Signed)
Subjective:    Patient ID: Robert Daniel, male    DOB: 05-28-1940, 77 y.o.   MRN: [id number]  Robert Daniel is a 77 y.o. male presenting on 02/14/2018 for Hypertension (SOB w/ exertion. Pt state the symptoms been going on x 6 mths ) and Diabetes   HPI  RESISTANT HTN / Lower Extremity Edema / CKD-III Home BP readings has variable systolic BP - ranging 301 to 170, he states if exertion of walking it may increase further. He has not made any changes. - Additionally admits some dyspnea with exertion, some days worse than others. Has not noticed a correlation with the blood pressure. He has not returned to [facility]iology - last seen 02/2017 - but he did not follow-up with them in 3 months Current Meds - Benazepril 40mg  daily, Hydralazine 100mg  QID, Clonidine 0.2mg  QID - Tosemide 100mg  PRN with K - improves edema with this PRN takes occasionally about 1 x weekly or so, he wears compression as well with relief Reports good compliance, took meds today. Tolerating well, w/o complaints. Lifestyle: Weight improved down 4 lbs in 6 months - his scale is even less He still has not yet returned to regular exercise at [facility] Denies chest pain or tightness actively or recently  CHRONIC DM, Type 2 with Nephropathy CKD-III Reports no concerns today. He admits not always adhering to diet plan Meds: Glimepiride 4mg  BID, Metformin 1000mg  BID Reports good compliance. Tolerating well w/o side-effects Currently on ACEi - No history of neuropathy or numbness tingling Diet - tries to do low carb diet, not always, admits some sweets - He already had DM Eye Exam at [facility] in past 2 months - will request record, he was told no problem with eyes, no retinopathy Denies hypoglycemia, polyuria, visual changes, numbness or tingling.  Depression screen [facility] 2/9 02/14/2018 12/20/2016  Decreased Interest 0 0  Down, Depressed, Hopeless 0 0  PHQ - 2 Score 0 0  Altered sleeping 0 -  Tired, decreased energy 0 -   Change in appetite 0 -  Feeling bad or failure about yourself  0 -  Trouble concentrating 0 -  Moving slowly or fidgety/restless 0 -  Suicidal thoughts 0 -  PHQ-9 Score 0 -  Difficult doing work/chores Not difficult at all -      Social History   Tobacco Use  . Smoking status: Never Smoker  . Smokeless tobacco: Never Used  Substance Use Topics  . Alcohol use: No    Comment: wine occasionally  . Drug use: No    Review of Systems Per HPI unless specifically indicated above     Objective:    BP (!) 194/90 (BP Location: Left Arm, Cuff Size: Normal)   Pulse 86   Temp 98.3 F (36.8 C) (Oral)   Resp 18   Ht 6\' 3"  (1.905 m)   Wt 284 lb 6.4 oz (129 kg)   SpO2 98%   BMI 35.55 kg/m   Wt Readings from Last 3 Encounters:  02/14/18 284 lb 6.4 oz (129 kg)  08/15/17 283 lb (128.4 kg)  08/08/17 288 lb 12.8 oz (131 kg)    Physical Exam  Constitutional: He is oriented to person, place, and time. He appears well-developed and well-nourished. No distress.  Well-appearing, comfortable, cooperative, obese  HENT:  Head: Normocephalic and atraumatic.  Mouth/Throat: Oropharynx is clear and moist.  Eyes: Conjunctivae are normal. Right eye exhibits no discharge. Left eye exhibits no discharge.  Neck: Normal range of motion.  Neck supple. No thyromegaly present.  Cardiovascular: Normal rate, regular rhythm, normal heart sounds and intact distal pulses.  No murmur heard. Pulmonary/Chest: Effort normal and breath sounds normal. No respiratory distress. He has no wheezes. He has no rales.  Musculoskeletal: Normal range of motion. He exhibits no edema.  Lymphadenopathy:    He has no cervical adenopathy.  Neurological: He is alert and oriented to person, place, and time.  Skin: Skin is warm and dry. No rash noted. He is not diaphoretic. No erythema.  Psychiatric: He has a normal mood and affect. His behavior is normal.  Well groomed, good eye contact, normal speech and thoughts, mildly  anxious appearing at baseline  Nursing note and vitals reviewed.    Recent Labs    03/14/17 0813 08/08/17 0915 02/14/18 0928  HGBA1C 7.5* 7.2* 7.9*    Results for orders placed or performed in visit on 02/14/18  POCT glycosylated hemoglobin (Hb A1C)  Result Value Ref Range   Hemoglobin A1C 7.9 (A) 4.0 - 5.6 %   HbA1c POC (<> result, manual entry)     HbA1c, POC (prediabetic range)     HbA1c, POC (controlled diabetic range)        Assessment & Plan:   Problem List Items Addressed This Visit    Resistant hypertension    Clinically asymptomatic today, has chronic uncontrolled HTN does not seem responding appropriately to therapy, often seems more anxious related and white coat HTN related, with variable BP - Home avg 140-150/60-80s variable readings -in office readings range 170 to 578+ - No known complications - Prior work-up done by Vascular and Renal with negative secondary HTN work-up, negative RAS. Recent work-up negative Hyperaldosterone labs, TSH - Failed: Bystolic, Metoprolol, HCTZ, Diovan, Amlodipine - Followed by [facility]  Plan:  1. Continue current BP regimen- continue Benazepril 40mg , Clonidine 0.2mg  QID, Hydralazine 100mg  QID - torsemide PRN 2. Encourage improved lifestyle - low sodium diet, regular exercise 3. Continue monitor BP outside office, bring readings to next visit for review 4. Follow-up with Cardiology for further work-up and management, as I have limited options left - now may pursue possible OSA - not interested at this time  He is overdue to return to cardiology, last seen 02/2017 asked to return in 3 months, now he should return to see them for next options. He would benefit from vascular PAD work up as well. Also in setting of recent dyspnea on exertion - may benefit from stress test or other workup  Strict return precautions given to patient when to go to hospital ED if worsening symptoms with BP      Type 2 diabetes mellitus with  nephropathy (HCC) - Primary    Elevated A1c up to 7.9, near goal 7-8 A1c still No hypoglycemia or significant hyperglycemia Complications - CKD-III in setting of HTN as well, and other including hyperlipidemia - increases risk of future cardiovascular complications  Plan:  1. Continue current therapy - Metformin 1000mg  BID, Glimepiride 4mg  BID - Counseling today on adjusting meds - discussed GLP1 agents injectable - he is interested will check insurance cost and coverage on these agents, we can consider sample if need to get started, also reviewed other oral meds SGLT2 and DPP4 as well. 2. Encourage improved lifestyle - low carb, low sugar diet, reduce portion size, continue improving regular exercise 3. Check CBG, bring log to next visit for review 4. Continue ASA, ACEi, Statin 5. Will request record from [facility] from last DM Eye  exam in past 2 months 10-01/2018 6. Follow-up 3 months DM A1c      Relevant Orders   POCT glycosylated hemoglobin (Hb A1C) (Completed)   White coat syndrome with hypertension    See A&P HTN Concern situational anxiety is related However seems to be severe reaction with BP >200 on average in office         No orders of the defined types were placed in this encounter.    Follow up plan: Return in about 3 months (around 05/16/2018) for Follow-up 3 months DM A1c, HTN - cards follow-up.  Nobie Putnam, DO [facility] 02/14/2018, 9:52 AM

## 2018-02-14 NOTE — Assessment & Plan Note (Signed)
Elevated A1c up to 7.9, near goal 7-8 A1c still No hypoglycemia or significant hyperglycemia Complications - CKD-III in setting of HTN as well, and other including hyperlipidemia - increases risk of future cardiovascular complications  Plan:  1. Continue current therapy - Metformin 1000mg  BID, Glimepiride 4mg  BID - Counseling today on adjusting meds - discussed GLP1 agents injectable - he is interested will check insurance cost and coverage on these agents, we can consider sample if need to get started, also reviewed other oral meds SGLT2 and DPP4 as well. 2. Encourage improved lifestyle - low carb, low sugar diet, reduce portion size, continue improving regular exercise 3. Check CBG, bring log to next visit for review 4. Continue ASA, ACEi, Statin 5. Will request record from [facility] from last DM Eye exam in past 2 months 10-01/2018 6. Follow-up 3 months DM A1c

## 2018-02-14 NOTE — Patient Instructions (Addendum)
Thank you for coming to the office today.  Please call us if you have new issues that need to be addressed before your next appt.   [facility]) HeartCare at [address] [place], [place] [address] Main: [phone]  - call for apt 2-6 weeks  ------------------------------------------  Call insurance find cost and coverage of the following  1. Ozempic (Semaglutide injection) - start 0.25mg  weekly for 4 weeks then increase to 0.5mg  weekly - This one has best benefit of weight loss and reducing Cardiovascular events  2. Bydureon BCise (Exenatide ER) - once weekly - this is my preference, very good medicine well tolerated, less side effects of nausea, upset stomach. No dose changes. Cost and coverage is the problem, but we may be able to get it with the coupon card  3. Trulicity (Dulaglutide) - once weekly - this is very good one, usually one of my top choices as well, two doses, 0.75 (likely we would start) and 1.5 max dose. We can use coupon card here too  4. Victoza (Liraglutide) - once DAILY - 3 dose changes 0.6, 1.2 and 1.8, side effects nausea, upset stomach higher on this one but it is still very effective medicine  ---------------------------  Other pill medications - consider these as well:  Fidela [place]  Recent Labs    03/14/17 0813 08/08/17 0915 02/14/18 0928  HGBA1C 7.5* 7.2* 7.9*    -----------------------   Please schedule a Follow-up Appointment to: Return in about 3 months (around 05/16/2018) for Follow-up 3 months DM A1c, HTN - cards follow-up.  If you have any other questions or concerns, please feel free to call the office or send a message through [facility]. You may also schedule an earlier appointment if necessary.  Additionally, you may be receiving a survey about your experience at our office within a few days to 1 week by e-mail or mail. We value your feedback.  Nobie Putnam,  DO [place]

## 2018-02-14 NOTE — Assessment & Plan Note (Signed)
See A&P HTN Concern situational anxiety is related However seems to be severe reaction with BP >200 on average in office

## 2018-02-14 NOTE — Assessment & Plan Note (Signed)
Clinically asymptomatic today, has chronic uncontrolled HTN does not seem responding appropriately to therapy, often seems more anxious related and white coat HTN related, with variable BP - Home avg 140-150/60-80s variable readings -in office readings range 170 to 300+ - No known complications - Prior work-up done by Vascular and Renal with negative secondary HTN work-up, negative RAS. Recent work-up negative Hyperaldosterone labs, TSH - Failed: Bystolic, Metoprolol, HCTZ, Diovan, Amlodipine - Followed by [facility] Cardiology  Plan:  1. Continue current BP regimen- continue Benazepril 40mg , Clonidine 0.2mg  QID, Hydralazine 100mg  QID - torsemide PRN 2. Encourage improved lifestyle - low sodium diet, regular exercise 3. Continue monitor BP outside office, bring readings to next visit for review 4. Follow-up with Cardiology for further work-up and management, as I have limited options left - now may pursue possible OSA - not interested at this time  He is overdue to return to cardiology, last seen 02/2017 asked to return in 3 months, now he should return to see them for next options. He would benefit from vascular PAD work up as well. Also in setting of recent dyspnea on exertion - may benefit from stress test or other workup  Strict return precautions given to patient when to go to hospital ED if worsening symptoms with BP

## 2018-02-25 NOTE — Progress Notes (Deleted)
Cardiology Office Note  Date:  02/25/2018   ID:  Robert Daniel, DOB March 16, 1940, MRN [id number]  PCP:  Robert Hauser, DO   No chief complaint on file.   HPI:  Robert Daniel is a pleasant 77 year old gentleman with past medical history of DM2, HBA1C 7.0 Hyperlipidemia Cardiac cath 2013 nonsmoker Labile hypertension/ white coat hypertension  Who presents for f/u of his hypertension  At home he reports blood pressure typically runs 810 up to 175 systolic on a regular basis Checks his blood pressure several times per day When seen in doctor's offices, systolic pressure typically in the 200s Todays no exception, blood pressure on arrival 220/100 On my recheck blood pressure 190/110 left arm This was also confirmed on his automatic blood pressure cuff which he brings with him today  Reports compliance with his medications,  takes hydralazine 100 mg 6 AM and 6 PM Was clonidine 0.2 mg 3 times daily 6 AM, 6 PM, midnight Benazepril 20 mg in the morning  Other medical team includes Vascular Surgery - Robert Daniel - Cardiac Catheterization, reportedly 40% blockage but do not have rest of report - Nephrology - Robert Daniel (2013 [facility]) - had work-up for potential resistant hypertension, reportedly unremarkable testing based on PCP records with negative renal arteriogram no evidence of RAS  Previous problems on amlodipine which cause leg edema, Notes indicating he failed Metoprolol, HCTZ, Diovan  Reports having episodes of shortness of breath when blood pressure runs high, often associated with anxiety At baseline does not have chest Daniel or shortness of breath, able to walk around [place] without any significant symptoms  EKG personally reviewed by myself on todays visit Shows normal sinus rhythm rate 84 bpm nonspecific ST abnormality V5, V6, 1 and aVL  PMH:   has a past medical history of Hyperlipidemia (02/16/2017) and Hypertension.  PSH:    Past Surgical History:   Procedure Laterality Date  . CARDIAC CATHETERIZATION  2013  . CATARACT EXTRACTION Bilateral 04/21/2008   R eye 05-12-08    Current Outpatient Medications  Medication Sig Dispense Refill  . acetaminophen (TYLENOL) 500 MG tablet Take 500 mg by mouth every 6 (six) hours as needed.    . Ascorbic Acid (VITAMIN C) 1000 MG tablet Take 1,000 mg by mouth daily.    Marland Kitchen aspirin EC 81 MG tablet Take 81 mg by mouth daily.    . benazepril (LOTENSIN) 40 MG tablet Take 1 tablet (40 mg total) by mouth daily. 90 tablet 3  . cloNIDine (CATAPRES) 0.2 MG tablet Take 1 tablet (0.2 mg total) by mouth 4 (four) times daily. 360 tablet 3  . co-enzyme Q-10 30 MG capsule Take 30 mg by mouth 3 (three) times daily.    . Cyanocobalamin (VITAMIN B-12) 5000 MCG TBDP Take by mouth.    Marland Kitchen gemfibrozil (LOPID) 600 MG tablet Take 1 tablet (600 mg total) by mouth 2 (two) times daily before a meal. 180 tablet 1  . glimepiride (AMARYL) 4 MG tablet TAKE 2 TABLETS BY MOUTH ONCE DAILY WITH BREAKFAST 180 tablet 1  . hydrALAZINE (APRESOLINE) 100 MG tablet Take 1 tablet (100 mg total) by mouth 4 (four) times daily. 360 tablet 3  . ibuprofen (ADVIL,MOTRIN) 200 MG tablet Take 800 mg by mouth every 6 (six) hours as needed.    . lovastatin (MEVACOR) 20 MG tablet Take 20 mg by mouth at bedtime.     . Melatonin 3-2 MG TABS Take by mouth.    . metFORMIN (GLUCOPHAGE) 1000  MG tablet Take 1 tablet (1,000 mg total) by mouth 2 (two) times daily with a meal. 180 tablet 1  . Multiple Vitamin (MULTIVITAMIN) tablet Take 1 tablet by mouth daily.    . nitroGLYCERIN (NITROSTAT) 0.4 MG SL tablet Place 1 tablet (0.4 mg total) under the tongue every 5 (five) minutes as needed for chest Daniel. (Patient not taking: Reported on 03/21/2017) 25 tablet 3  . Omega-3 Fatty Acids (OMEGA-3 FISH OIL) 1200 MG CAPS Take by mouth.    . potassium chloride (KLOR-CON) 20 MEQ packet Take 20 mEq by mouth as needed. Take with Torsemide (Patient not taking: Reported on 02/14/2018) 30  tablet 1  . torsemide (DEMADEX) 100 MG tablet Take 1 tablet (100 mg total) by mouth as needed. (Patient taking differently: Take 50 mg by mouth as needed. ) 30 tablet 1  . triamcinolone cream (KENALOG) 0.1 % Apply 1 application topically 2 (two) times daily as needed. 453.6 g 1  . Turmeric 500 MG TABS Take by mouth.     No current facility-administered medications for this visit.      Allergies:   Patient has no known allergies.   Social History:  The patient  reports that he has never smoked. He has never used smokeless tobacco. He reports that he does not drink alcohol or use drugs.   Family History:   family history is not on file.    Review of Systems: Review of Systems  Constitutional: Negative.   Respiratory: Positive for shortness of breath.   Cardiovascular: Negative.   Gastrointestinal: Negative.   Musculoskeletal: Negative.   Neurological: Negative.   Psychiatric/Behavioral: The patient is nervous/anxious.   All other systems reviewed and are negative.    PHYSICAL EXAM: VS:  There were no vitals taken for this visit. , BMI There is no height or weight on file to calculate BMI.  Repeat blood pressure 190/110 by myself GEN: Well nourished, well developed, in no acute distress , obese HEENT: normal  Neck: no JVD, carotid bruits, or masses Cardiac: RRR; no murmurs, rubs, or gallops,no edema  Respiratory:  clear to auscultation bilaterally, normal work of breathing GI: soft, nontender, nondistended, + BS MS: no deformity or atrophy  Skin: warm and dry, no rash Neuro:  Strength and sensation are intact Psych: euthymic mood, full affect    Recent Labs: 03/14/2017: TSH 3.66 08/08/2017: ALT 18; BUN 29; Creat 1.27; Hemoglobin 13.0; Platelets 142; Potassium 5.0; Sodium 142    Lipid Panel Lab Results  Component Value Date   CHOL 202 (H) 08/08/2017   HDL 30 (L) 08/08/2017   LDLCALC 130 (H) 08/08/2017   TRIG 275 (H) 08/08/2017      Wt Readings from Last 3  Encounters:  02/14/18 284 lb 6.4 oz (129 kg)  08/15/17 283 lb (128.4 kg)  08/08/17 288 lb 12.8 oz (131 kg)       ASSESSMENT AND PLAN:  Resistant hypertension - Plan: EKG 12-Lead, benazepril (LOTENSIN) 40 MG tablet Long discussion concerning various options with his medications We will try to keep him on his current prescriptions Suggested he take hydralazine 100 mg 3 times daily when he takes his clonidine 0.2 mg 3 times daily.  Recommended he increase benazepril up to 40 mg daily He will continue to monitor blood pressures at home  White coat syndrome with hypertension - Plan: EKG 12-Lead Automated cuff with him today corresponds to our manual measurements He does report systolics typically 465 up to 035 systolic at home Improved from  office numbers  Controlled type 2 diabetes mellitus without complication, without long-term current use of insulin ([place]) - Plan: EKG 12-Lead We have encouraged continued exercise, careful diet management in an effort to lose weight.  Mixed hyperlipidemia - Plan: EKG 12-Lead Blood pressure close to goal If more aggressive management is considered, could add Zetia 10 mg daily He is trying to limit the number of pills  Shortness of breath Rare episodes of shortness of breath in the setting of anxiety possibly exacerbated by severe hypertension.  For these episodes recommended he take nitroglycerin for emergencies only as a rapid way to improve his blood pressure.  Disposition:   F/U  3 months   Total encounter time more than 45 minutes  Greater than 50% was spent in counseling and coordination of care with the patient    No orders of the defined types were placed in this encounter.    Signed, Esmond Plants, M.D., Ph.D. 02/25/2018  [place], [place]

## 2018-02-26 ENCOUNTER — Ambulatory Visit: Payer: Medicare HMO | Admitting: Cardiovascular Disease

## 2018-02-27 ENCOUNTER — Ambulatory Visit: Payer: Medicare HMO | Admitting: Cardiovascular Disease

## 2018-02-27 ENCOUNTER — Encounter: Payer: Self-pay | Admitting: Cardiovascular Disease

## 2018-02-27 VITALS — BP 175/80 | HR 80 | Ht 75.0 in | Wt 279.0 lb

## 2018-02-27 DIAGNOSIS — R0602 Shortness of breath: Secondary | ICD-10-CM

## 2018-02-27 DIAGNOSIS — I10 Essential (primary) hypertension: Secondary | ICD-10-CM

## 2018-02-27 MED ORDER — ISOSORBIDE MONONITRATE ER 30 MG PO TB24: 30.0000 mg | ORAL_TABLET | Freq: Every day | ORAL | 6 refills | Status: DC

## 2018-02-27 NOTE — Progress Notes (Signed)
Cardiology Office Note  Date:  02/27/2018   ID:  Robert Daniel, DOB [birth year], MRN [id number]  PCP:  Olin Hauser, DO   Chief Complaint  Patient presents with  . other    F/U SOB with exertion. Elevated BP. Medications reviewed verbally.     HPI:  Robert Daniel is a pleasant 77 year old gentleman with past medical history of DM2, HBA1C 7.0 Hyperlipidemia Cardiac cath 2013 Nonsmoker Anxiety Chronic shortness of breath on exertion Labile hypertension/ white coat hypertension  Who presents for follow-up of his hypertension  On his last clinic visit we increased the benazepril up to 40 mg daily, hydralazine up to 100 mg 4 times daily Blood pressure measurements at home typically 144-150/60 to 80 At rest 086 systolic  Sometimes  Denies any symptoms apart from shortness of breath on exertion No regular exercise program Reports that his weight is trending downward, trying to watch what he eats  Typically on office visits blood pressure will run 578 systolic Reports compliance with his medications,   EKG personally reviewed by myself on todays visit Shows normal sinus rhythm rate 80 bpm nonspecific ST abnormality precordial leads, 1 and aVL  Other medical team includes Vascular Surgery - Dr Leotis Pain -  Cardiac Catheterization, reportedly 40% blockage but do not have rest of report - Nephrology - Dr Lavonia Dana (2013 [facility]) - had work-up for potential resistant hypertension, reportedly unremarkable testing based on PCP records with negative renal arteriogram no evidence of RAS  Previous problems on amlodipine which cause leg edema, Notes indicating he failed Metoprolol, HCTZ, Diovan    PMH:   has a past medical history of Hyperlipidemia (02/16/2017) and Hypertension.  PSH:    Past Surgical History:  Procedure Laterality Date  . CARDIAC CATHETERIZATION  2013  . CATARACT EXTRACTION Bilateral 04/21/2008   R eye 05-12-08    Current Outpatient Medications   Medication Sig Dispense Refill  . acetaminophen (TYLENOL) 500 MG tablet Take 500 mg by mouth every 6 (six) hours as needed.    . Ascorbic Acid (VITAMIN C) 1000 MG tablet Take 1,000 mg by mouth daily.    Marland Kitchen aspirin EC 81 MG tablet Take 81 mg by mouth daily.    . benazepril (LOTENSIN) 40 MG tablet Take 1 tablet (40 mg total) by mouth daily. 90 tablet 3  . cloNIDine (CATAPRES) 0.2 MG tablet Take 1 tablet (0.2 mg total) by mouth 4 (four) times daily. 360 tablet 3  . co-enzyme Q-10 30 MG capsule Take 30 mg by mouth 3 (three) times daily.    Marland Kitchen gemfibrozil (LOPID) 600 MG tablet Take 1 tablet (600 mg total) by mouth 2 (two) times daily before a meal. 180 tablet 1  . glimepiride (AMARYL) 4 MG tablet TAKE 2 TABLETS BY MOUTH ONCE DAILY WITH BREAKFAST 180 tablet 1  . hydrALAZINE (APRESOLINE) 100 MG tablet Take 1 tablet (100 mg total) by mouth 4 (four) times daily. 360 tablet 3  . ibuprofen (ADVIL,MOTRIN) 200 MG tablet Take 800 mg by mouth every 6 (six) hours as needed.    . lovastatin (MEVACOR) 20 MG tablet Take 20 mg by mouth at bedtime.     . Melatonin 3-2 MG TABS Take by mouth.    . metFORMIN (GLUCOPHAGE) 1000 MG tablet Take 1 tablet (1,000 mg total) by mouth 2 (two) times daily with a meal. 180 tablet 1  . Multiple Vitamin (MULTIVITAMIN) tablet Take 1 tablet by mouth daily.    . Omega-3 Fatty Acids (OMEGA-3 FISH OIL)  1200 MG CAPS Take by mouth.    . potassium chloride (KLOR-CON) 20 MEQ packet Take 20 mEq by mouth as needed. Take with Torsemide 30 tablet 1  . torsemide (DEMADEX) 100 MG tablet Take 1 tablet (100 mg total) by mouth as needed. (Patient taking differently: Take 50 mg by mouth as needed. ) 30 tablet 1  . triamcinolone cream (KENALOG) 0.1 % Apply 1 application topically 2 (two) times daily as needed. 453.6 g 1  . Turmeric 500 MG TABS Take by mouth.    . nitroGLYCERIN (NITROSTAT) 0.4 MG SL tablet Place 1 tablet (0.4 mg total) under the tongue every 5 (five) minutes as needed for chest pain.  (Patient not taking: Reported on 03/21/2017) 25 tablet 3   No current facility-administered medications for this visit.      Allergies:   Patient has no known allergies.   Social History:  The patient  reports that he has never smoked. He has never used smokeless tobacco. He reports that he does not drink alcohol or use drugs.   Family History:   family history is not on file.    Review of Systems: Review of Systems  Constitutional: Negative.   Respiratory: Positive for shortness of breath.   Cardiovascular: Negative.   Gastrointestinal: Negative.   Musculoskeletal: Negative.   Neurological: Negative.   Psychiatric/Behavioral: The patient is nervous/anxious.   All other systems reviewed and are negative.    PHYSICAL EXAM: VS:  BP (!) 206/88 (BP Location: Left Arm, Patient Position: Sitting, Cuff Size: Large)   Pulse 80   Ht 6\' 3"  (1.905 m)   Wt 279 lb (126.6 kg)   BMI 34.87 kg/m  , BMI Body mass index is 34.87 kg/m.  Constitutional:  oriented to person, place, and time. No distress.  HENT:  Head: Grossly normal Eyes:  no discharge. No scleral icterus.  Neck: No JVD, no carotid bruits  Cardiovascular: Regular rate and rhythm, no murmurs appreciated Pulmonary/Chest: Clear to auscultation bilaterally, no wheezes or rails Abdominal: Soft.  no distension.  no tenderness.  Musculoskeletal: Normal range of motion Neurological:  normal muscle tone. Coordination normal. No atrophy Skin: Skin warm and dry Psychiatric: normal affect, pleasant  Recent Labs: 03/14/2017: TSH 3.66 08/08/2017: ALT 18; BUN 29; Creat 1.27; Hemoglobin 13.0; Platelets 142; Potassium 5.0; Sodium 142    Lipid Panel Lab Results  Component Value Date   CHOL 202 (H) 08/08/2017   HDL 30 (L) 08/08/2017   LDLCALC 130 (H) 08/08/2017   TRIG 275 (H) 08/08/2017     Wt Readings from Last 3 Encounters:  02/27/18 279 lb (126.6 kg)  02/14/18 284 lb 6.4 oz (129 kg)  08/15/17 283 lb (128.4 kg)        ASSESSMENT AND PLAN:  Resistant hypertension - Plan: EKG 12-Lead, benazepril (LOTENSIN) 40 MG tablet Recommend he start isosorbide 30 mg daily with slow titration upwards as tolerated Hydralazine 100 mg costing him more money We will try to go back down to 50 mg at a later date  White coat syndrome with hypertension - Plan: EKG 21-HYQM 578 systolic on arrival down to 170 at the end of our visit Numbers at home 140 up to 150 like  Controlled type 2 diabetes mellitus without complication, without long-term current use of insulin ([place]) - Plan: EKG 12-Lead We have encouraged continued exercise, careful diet management in an effort to lose weight.  He reports following a strict diet  Mixed hyperlipidemia - Plan: EKG 12-Lead Zetia previously offered  but he has declined  Shortness of breath Secondary to poorly controlled hypertension on exertion Exacerbated by conditioning Recommended low grade regular exercise for conditioning  Disposition:   He will call us with blood pressure measurements   Total encounter time more than 25 minutes  Greater than 50% was spent in counseling and coordination of care with the patient   No orders of the defined types were placed in this encounter.    Signed, Esmond Plants, M.D., Ph.D. 02/27/2018  Robert Daniel, Robert Daniel

## 2018-02-27 NOTE — Patient Instructions (Addendum)
Medication Instructions:   Please start imdur/isosorbide 30 mg daily (ok to cut in 1/2 to start if needed)  Monitor pressures We can go up to 60 mg daily or 30 twice a daily if needed  If you need a refill on your cardiac medications before your next appointment, please call your pharmacy.    Lab work: No new labs needed   If you have labs (blood work) drawn today and your tests are completely normal, you will receive your results only by: Marland Kitchen MyChart Message (if you have MyChart) OR . A paper copy in the mail If you have any lab test that is abnormal or we need to change your treatment, we will call you to review the results.   Testing/Procedures: No new testing needed   Follow-Up: At [facility], you and your health needs are our priority.  As part of our continuing mission to provide you with exceptional heart care, we have created designated Provider Care Teams.  These Care Teams include your primary Cardiologist (physician) and Advanced Practice Providers (APPs -  Physician Assistants and Nurse Practitioners) who all work together to provide you with the care you need, when you need it.  . You will need a follow up appointment as needed  . Providers on your designated Care Team:   . Murray Hodgkins, NP . Christell Faith, PA-C . Marrianne Mood, PA-C  Any Other Special Instructions Will Be Listed Below (If Applicable).  For educational health videos Log in to : [email or web address] Or : [email or web address], password : [email or web address]

## 2018-03-19 ENCOUNTER — Other Ambulatory Visit: Payer: Self-pay | Admitting: Cardiovascular Disease

## 2018-03-19 DIAGNOSIS — I10 Essential (primary) hypertension: Secondary | ICD-10-CM

## 2018-03-27 NOTE — Telephone Encounter (Signed)
Would increase the imdur up to 30 mg BID Continue to monitor pressures

## 2018-03-29 ENCOUNTER — Other Ambulatory Visit: Payer: Self-pay | Admitting: Family Medicine

## 2018-03-29 DIAGNOSIS — E1169 Type 2 diabetes mellitus with other specified complication: Secondary | ICD-10-CM

## 2018-03-29 DIAGNOSIS — E785 Hyperlipidemia, unspecified: Principal | ICD-10-CM

## 2018-03-29 MED ORDER — LOVASTATIN 20 MG PO TABS: 20.0000 mg | ORAL_TABLET | Freq: Every day | ORAL | 1 refills | Status: DC

## 2018-04-04 MED ORDER — ISOSORBIDE MONONITRATE ER 30 MG PO TB24: 30.0000 mg | ORAL_TABLET | Freq: Two times a day (BID) | ORAL | 3 refills | Status: DC

## 2018-04-29 NOTE — Telephone Encounter (Signed)
imdur 60 daily could be increased up to 60 BID

## 2018-04-30 ENCOUNTER — Other Ambulatory Visit: Payer: Self-pay | Admitting: *Deleted

## 2018-04-30 MED ORDER — ISOSORBIDE MONONITRATE ER 60 MG PO TB24: 60.0000 mg | ORAL_TABLET | Freq: Two times a day (BID) | ORAL | 3 refills | Status: DC

## 2018-05-16 ENCOUNTER — Ambulatory Visit: Payer: Medicare HMO | Admitting: Family Medicine

## 2018-05-23 ENCOUNTER — Ambulatory Visit (INDEPENDENT_AMBULATORY_CARE_PROVIDER_SITE_OTHER): Payer: Medicare HMO | Admitting: Family Medicine

## 2018-05-23 ENCOUNTER — Other Ambulatory Visit: Payer: Self-pay

## 2018-05-23 ENCOUNTER — Encounter: Payer: Self-pay | Admitting: Family Medicine

## 2018-05-23 VITALS — BP 160/78 | HR 71 | Temp 97.8°F | Ht 75.0 in | Wt 282.2 lb

## 2018-05-23 DIAGNOSIS — I10 Essential (primary) hypertension: Secondary | ICD-10-CM

## 2018-05-23 DIAGNOSIS — E1121 Type 2 diabetes mellitus with diabetic nephropathy: Secondary | ICD-10-CM

## 2018-05-23 LAB — POCT GLYCOSYLATED HEMOGLOBIN (HGB A1C): HEMOGLOBIN A1C: 6.6 % — AB (ref 4.0–5.6)

## 2018-05-23 NOTE — Patient Instructions (Addendum)
Thank you for coming to the office today.  Recent Labs    08/08/17 0915 02/14/18 0928 05/23/18 1114  HGBA1C 7.2* 7.9* 6.6*    Now with lower sugar reading and your improvements with lifestyle - we need to be cautious with low blood sugar.  Try to reduce Glimepiride from twice a day now down to ONCE daily in AM now with breakfast  If continue to get low sugars < 70 with symptoms, notify me and we may have to consider stopping it or even reducing dose further.  Continue Metformin  ------------------------------------------------------  DUE for FASTING BLOOD WORK (no food or drink after midnight before the lab appointment, only water or coffee without cream/sugar on the morning of)  SCHEDULE "Lab Only" visit in the morning at the clinic for lab draw in 3 MONTHS   - Make sure Lab Only appointment is at about 1 week before your next appointment, so that results will be available  For Lab Results, once available within 2-3 days of blood draw, you can can log in to MyChart online to view your results and a brief explanation. Also, we can discuss results at next follow-up visit.   Please schedule a Follow-up Appointment to: Return in about 3 months (around 08/23/2018) for Annual Physical.  If you have any other questions or concerns, please feel free to call the office or send a message through [place]. You may also schedule an earlier appointment if necessary.  Additionally, you may be receiving a survey about your experience at our office within a few days to 1 week by e-mail or mail. We value your feedback.  Nobie Putnam, DO [place]

## 2018-05-23 NOTE — Progress Notes (Signed)
Subjective:    Patient ID: Robert Daniel, male    DOB: 1940-08-05, 78 y.o.   MRN: [id number]  Aashir Umholtz is a 78 y.o. male presenting on 05/23/2018 for Diabetes (diabetic eye exam scheduled for 07/18/18 ) and Hypertension (bp at home averaging 150/80)   HPI   RESISTANT HTN Home BP readings still variable systolic BP - ranging 784 to 160 now, he states if exertion of walking it may increase further. He has not made any changes. It is usually better at home, and higher at doctors office. He is not worried about BP anymore Current Meds -Benazepril 40mg  daily, Hydralazine 100mg  QID, Clonidine 0.2mg  QID - Tosemide 100mg  PRN with K - improves edema with this PRN takes occasionally about 1 x weekly or so, he wears compression as well with relief Reports good compliance, took meds today. Tolerating well, w/o complaints. Lifestyle: Weight gain again by our scale Denies chest pain or tightness actively or recently  CHRONIC DM, Type 2with Nephropathy CKD-III Since last visit Improved diet and lifestyle, no change to med last time - History of x 2 episode mild hypoglycemia 66 to 77, symptomatic Prior A1c 7.2 to 7.9, now today improved Meds:Glimepiride 4mg  BID, Metformin 1000mg  BID Reports good compliance. Tolerating well w/o side-effects Currently on ACEi - No history of neuropathy or numbness tingling Diet - tries to do low carb diet, not always, admits some sweets - He is scheduled DM Eye Exam [facility] 07/18/18 Admits rare hypoglycemia Denies polyuria, visual changes, numbness or tingling.    Depression screen [facility] 2/9 02/14/2018 12/20/2016  Decreased Interest 0 0  Down, Depressed, Hopeless 0 0  PHQ - 2 Score 0 0  Altered sleeping 0 -  Tired, decreased energy 0 -  Change in appetite 0 -  Feeling bad or failure about yourself  0 -  Trouble concentrating 0 -  Moving slowly or fidgety/restless 0 -  Suicidal thoughts 0 -  PHQ-9 Score 0 -  Difficult doing work/chores  Not difficult at all -    Social History   Tobacco Use  . Smoking status: Never Smoker  . Smokeless tobacco: Never Used  Substance Use Topics  . Alcohol use: No    Comment: wine occasionally  . Drug use: No    Review of Systems Per HPI unless specifically indicated above     Objective:    BP (!) 160/78 (BP Location: Left Arm, Cuff Size: Normal)   Pulse 71   Temp 97.8 F (36.6 C) (Oral)   Ht 6\' 3"  (1.905 m)   Wt 282 lb 3.2 oz (128 kg)   BMI 35.27 kg/m   Wt Readings from Last 3 Encounters:  05/23/18 282 lb 3.2 oz (128 kg)  02/27/18 279 lb (126.6 kg)  02/14/18 284 lb 6.4 oz (129 kg)    Physical Exam Vitals signs and nursing note reviewed.  Constitutional:      General: He is not in acute distress.    Appearance: He is well-developed. He is not diaphoretic.     Comments: Well-appearing, comfortable, cooperative, obese  HENT:     Head: Normocephalic and atraumatic.  Eyes:     General:        Right eye: No discharge.        Left eye: No discharge.     Conjunctiva/sclera: Conjunctivae normal.  Neck:     Musculoskeletal: Normal range of motion and neck supple.     Thyroid: No thyromegaly.  Cardiovascular:  Rate and Rhythm: Normal rate and regular rhythm.     Heart sounds: Normal heart sounds. No murmur.  Pulmonary:     Effort: Pulmonary effort is normal. No respiratory distress.     Breath sounds: Normal breath sounds. No wheezing or rales.  Musculoskeletal: Normal range of motion.  Lymphadenopathy:     Cervical: No cervical adenopathy.  Skin:    General: Skin is warm and dry.     Findings: No erythema or rash.  Neurological:     Mental Status: He is alert and oriented to person, place, and time.  Psychiatric:        Behavior: Behavior normal.     Comments: Well groomed, good eye contact, normal speech and thoughts, mildly anxious appearing at baseline      Recent Labs    08/08/17 0915 02/14/18 0928 05/23/18 1114  HGBA1C 7.2* 7.9* 6.6*     Results for orders placed or performed in visit on 05/23/18  POCT glycosylated hemoglobin (Hb A1C)  Result Value Ref Range   Hemoglobin A1C 6.6 (A) 4.0 - 5.6 %      Assessment & Plan:   Problem List Items Addressed This Visit    Resistant hypertension    Clinically asymptomatic today, has chronic uncontrolled HTN does not seem responding appropriately to therapy, often seems more anxious related and white coat HTN related, with variable BP - Home avg 140-150/60-80s variable readings -in office readings range 170 to 834+ - No known complications - Prior work-up done by Vascular and Renal with negative secondary HTN work-up, negative RAS. Recent work-up negative Hyperaldosterone labs, TSH - Failed: Bystolic, Metoprolol, HCTZ, Diovan, Amlodipine - Followed by [facility]  Plan:  1. Continue current BP regimen- continue Benazepril 40mg , Clonidine 0.2mg  QID, Hydralazine 100mg  QID - torsemide PRN 2. Encourage improved lifestyle - low sodium diet, regular exercise 3. Continue monitor BP outside office, bring readings to next visit for review 4. Follow-up with Cardiology for further work-up and management, as I have limited options left  Strict return precautions given to patient when to go to hospital ED if worsening symptoms with BP      Type 2 diabetes mellitus with nephropathy ([place]) - Primary    Significantly improved A1c down to 6.6 No significant hyperglycemia Complications - RARE HYPOGLYCEMIA - CKD-III in setting of HTN as well, and other including hyperlipidemia - increases risk of future cardiovascular complications  Plan:  1. REDUCE Glimepiride 4mg  from BID to DAILY - only with breakfast larger meal, caution still hypoglycemia - advise may DISCONTINUE in future if still lower and controlled avg 2. Current therapy - Metformin 1000mg  BID - he declined GLP1 or other agent, did not check cost 2. Encourage improved lifestyle - low carb, low sugar diet, reduce portion size,  continue improving regular exercise 3. Check CBG, bring log to next visit for review 4. Continue ASA, ACEi, Statin 5. Next DM Eye [place] 07/2018 6. Follow-up 3 months annual      Relevant Orders   POCT glycosylated hemoglobin (Hb A1C) (Completed)   White coat syndrome with hypertension      No orders of the defined types were placed in this encounter.    Follow up plan: Return in about 3 months (around 08/23/2018) for Annual Physical.  Future labs ordered for 08/28/18  Nobie Putnam, [facility] 05/23/2018, 11:13 AM

## 2018-05-24 ENCOUNTER — Other Ambulatory Visit: Payer: Self-pay | Admitting: Family Medicine

## 2018-05-24 DIAGNOSIS — E1169 Type 2 diabetes mellitus with other specified complication: Secondary | ICD-10-CM

## 2018-05-24 DIAGNOSIS — I10 Essential (primary) hypertension: Secondary | ICD-10-CM

## 2018-05-24 DIAGNOSIS — E1121 Type 2 diabetes mellitus with diabetic nephropathy: Secondary | ICD-10-CM

## 2018-05-24 DIAGNOSIS — E785 Hyperlipidemia, unspecified: Secondary | ICD-10-CM

## 2018-05-24 DIAGNOSIS — Z0000 Encounter for general adult medical examination without abnormal findings: Secondary | ICD-10-CM

## 2018-05-24 NOTE — Assessment & Plan Note (Signed)
Clinically asymptomatic today, has chronic uncontrolled HTN does not seem responding appropriately to therapy, often seems more anxious related and white coat HTN related, with variable BP - Home avg 140-150/60-80s variable readings -in office readings range 170 to 445+ - No known complications - Prior work-up done by Vascular and Renal with negative secondary HTN work-up, negative RAS. Recent work-up negative Hyperaldosterone labs, TSH - Failed: Bystolic, Metoprolol, HCTZ, Diovan, Amlodipine - Followed by [facility] Cardiology  Plan:  1. Continue current BP regimen- continue Benazepril 40mg , Clonidine 0.2mg  QID, Hydralazine 100mg  QID - torsemide PRN 2. Encourage improved lifestyle - low sodium diet, regular exercise 3. Continue monitor BP outside office, bring readings to next visit for review 4. Follow-up with Cardiology for further work-up and management, as I have limited options left  Strict return precautions given to patient when to go to hospital ED if worsening symptoms with BP

## 2018-05-24 NOTE — Assessment & Plan Note (Signed)
Significantly improved A1c down to 6.6 No significant hyperglycemia Complications - RARE HYPOGLYCEMIA - CKD-III in setting of HTN as well, and other including hyperlipidemia - increases risk of future cardiovascular complications  Plan:  1. REDUCE Glimepiride 4mg  from BID to DAILY - only with breakfast larger meal, caution still hypoglycemia - advise may DISCONTINUE in future if still lower and controlled avg 2. Current therapy - Metformin 1000mg  BID - he declined GLP1 or other agent, did not check cost 2. Encourage improved lifestyle - low carb, low sugar diet, reduce portion size, continue improving regular exercise 3. Check CBG, bring log to next visit for review 4. Continue ASA, ACEi, Statin 5. Next DM Eye [place] 07/2018 6. Follow-up 3 months annual

## 2018-05-26 ENCOUNTER — Other Ambulatory Visit: Payer: Self-pay | Admitting: Family Medicine

## 2018-05-26 DIAGNOSIS — E119 Type 2 diabetes mellitus without complications: Secondary | ICD-10-CM

## 2018-06-14 ENCOUNTER — Other Ambulatory Visit: Payer: Self-pay | Admitting: Cardiovascular Disease

## 2018-06-15 ENCOUNTER — Other Ambulatory Visit: Payer: Self-pay | Admitting: *Deleted

## 2018-06-15 MED ORDER — FELODIPINE ER 5 MG PO TB24: 5.0000 mg | ORAL_TABLET | Freq: Every day | ORAL | 3 refills | Status: DC

## 2018-06-27 DIAGNOSIS — E1121 Type 2 diabetes mellitus with diabetic nephropathy: Secondary | ICD-10-CM | POA: Diagnosis not present

## 2018-07-05 ENCOUNTER — Telehealth: Payer: Self-pay | Admitting: Cardiovascular Disease

## 2018-07-05 NOTE — Telephone Encounter (Signed)
The felodipine was supposed to be in addition to his others, including the isosorbide 60 BID, not instead of We used that one as he reported an intolerance to amlodipine. We could retry a lower dose of the amlodipine, 5 mg and see if he can tolerate a small amount of leg swelling

## 2018-07-05 NOTE — Telephone Encounter (Signed)
Called patient. We reviewed his list of medications and MyChart messages. He was under the impression he was to stop the isosorbide when Dr Rockey Situ added the Felodipine. However, I did not see where the isosorbide was discontinued. We discussed this and the patient realized he stopped the isosorbide on his own. Patient was very apologetic. He will resume the isosorbide 60 mg BID and continue to monitor BP/HR and let us know how the pressures are running. He was very Patent attorney.

## 2018-07-05 NOTE — Telephone Encounter (Signed)
Please call to discuss Felodipine, states his BP is still elevated.  States when he was on Isosorbide, his BP was lower.  This morning, After medication, 174/88, 10 min later 175/77 HR 67. States this is avergae

## 2018-07-07 ENCOUNTER — Other Ambulatory Visit: Payer: Self-pay | Admitting: Family Medicine

## 2018-07-07 DIAGNOSIS — E1121 Type 2 diabetes mellitus with diabetic nephropathy: Secondary | ICD-10-CM

## 2018-07-08 NOTE — Telephone Encounter (Signed)
Frequent phone calls to adjust his pressures, needs a telemetry visit

## 2018-07-09 ENCOUNTER — Telehealth: Payer: Self-pay | Admitting: *Deleted

## 2018-07-09 NOTE — Telephone Encounter (Signed)
Spoke with patient and reviewed that provider wants to have a visit with him in order to discuss his medications. Reviewed blood pressure monitoring and instructions when doing this. He continued to talk about his readings and advised that it would really benefit him from talking with provider to discuss his concerns. He was agreeable with telephone visit and reviewed consent with him at that time. He was willing to proceed and then reviewed all medications. He verbalized understanding that he would get a call tomorrow and to have his blood pressure readings handy with him at that time.    YOUR CARDIOLOGY TEAM HAS ARRANGED FOR AN E-VISIT FOR YOUR APPOINTMENT - PLEASE REVIEW IMPORTANT INFORMATION BELOW SEVERAL DAYS PRIOR TO YOUR APPOINTMENT  Due to the recent COVID-19 pandemic, we are transitioning in-person office visits to tele-medicine visits in an effort to decrease unnecessary exposure to our patients, their families, and staff. These visits are billed to your insurance just like a normal visit is. We also encourage you to sign up for MyChart if you have not already done so. You will need a smartphone if possible. For patients that do not have this, we can still complete the visit using a regular telephone but do prefer a smartphone to enable video when possible. You may have a family member that lives with you that can help. If possible, we also ask that you have a blood pressure cuff and scale at home to measure your blood pressure, heart rate and weight prior to your scheduled appointment. Patients with clinical needs that need an in-person evaluation and testing will still be able to come to the office if absolutely necessary. If you have any questions, feel free to call our office.   2-3 DAYS BEFORE YOUR APPOINTMENT  You will receive a telephone call from one of our [facility] team members - your caller ID may say "Unknown caller." If this is a video visit, we will walk you through how to get the  video launched on your phone. We will remind you check your blood pressure, heart rate and weight prior to your scheduled appointment. If you have an Apple Watch or Kardia, please upload any pertinent ECG strips the day before or morning of your appointment to [facility]. Our staff will also make sure you have reviewed the consent and agree to move forward with your scheduled tele-health visit.    THE DAY OF YOUR APPOINTMENT  Approximately 15 minutes prior to your scheduled appointment, you will receive a telephone call from one of [facility] team - your caller ID may say "Unknown caller."  Our staff will confirm medications, vital signs for the day and any symptoms you may be experiencing. Please have this information available prior to the time of visit start. It may also be helpful for you to have a pad of paper and pen handy for any instructions given during your visit. They will also walk you through joining the smartphone meeting if this is a video visit.    CONSENT FOR TELE-HEALTH VISIT - PLEASE REVIEW  I hereby voluntarily request, consent and authorize [facility] and its employed or contracted physicians, physician assistants, nurse practitioners or other licensed health care professionals (the Practitioner), to provide me with telemedicine health care services (the "Services") as deemed necessary by the treating Practitioner. I acknowledge and consent to receive the Services by the Practitioner via telemedicine. I understand that the telemedicine visit will involve communicating with the Practitioner through live audiovisual communication technology and the disclosure  of certain medical information by electronic transmission. I acknowledge that I have been given the opportunity to request an in-person assessment or other available alternative prior to the telemedicine visit and am voluntarily participating in the telemedicine visit.  I understand that I have the right to withhold or withdraw  my consent to the use of telemedicine in the course of my care at any time, without affecting my right to future care or treatment, and that the Practitioner or I may terminate the telemedicine visit at any time. I understand that I have the right to inspect all information obtained and/or recorded in the course of the telemedicine visit and may receive copies of available information for a reasonable fee.  I understand that some of the potential risks of receiving the Services via telemedicine include:  Marland Kitchen Delay or interruption in medical evaluation due to technological equipment failure or disruption; . Information transmitted may not be sufficient (e.g. poor resolution of images) to allow for appropriate medical decision making by the Practitioner; and/or  . In rare instances, security protocols could fail, causing a breach of personal health information.  Furthermore, I acknowledge that it is my responsibility to provide information about my medical history, conditions and care that is complete and accurate to the best of my ability. I acknowledge that Practitioner's advice, recommendations, and/or decision may be based on factors not within their control, such as incomplete or inaccurate data provided by me or distortions of diagnostic images or specimens that may result from electronic transmissions. I understand that the practice of medicine is not an exact science and that Practitioner makes no warranties or guarantees regarding treatment outcomes. I acknowledge that I will receive a copy of this consent concurrently upon execution via email to the email address I last provided but may also request a printed copy by calling the office of [facility].    I understand that my insurance will be billed for this visit.   I have read or had this consent read to me. . I understand the contents of this consent, which adequately explains the benefits and risks of the Services being provided via  telemedicine.  . I have been provided ample opportunity to ask questions regarding this consent and the Services and have had my questions answered to my satisfaction. . I give my informed consent for the services to be provided through the use of telemedicine in my medical care  By participating in this telemedicine visit I agree to the above.

## 2018-07-09 NOTE — Telephone Encounter (Signed)
Patient calling Requesting to speak with Robert Daniel, has questions Declines scheduling an appointment Please call to discuss

## 2018-07-10 ENCOUNTER — Telehealth (INDEPENDENT_AMBULATORY_CARE_PROVIDER_SITE_OTHER): Payer: Medicare HMO | Admitting: Cardiovascular Disease

## 2018-07-10 ENCOUNTER — Other Ambulatory Visit: Payer: Self-pay

## 2018-07-10 ENCOUNTER — Telehealth: Payer: Self-pay | Admitting: Cardiovascular Disease

## 2018-07-10 DIAGNOSIS — E782 Mixed hyperlipidemia: Secondary | ICD-10-CM | POA: Diagnosis not present

## 2018-07-10 DIAGNOSIS — I10 Essential (primary) hypertension: Secondary | ICD-10-CM | POA: Diagnosis not present

## 2018-07-10 DIAGNOSIS — R0602 Shortness of breath: Secondary | ICD-10-CM

## 2018-07-10 DIAGNOSIS — E119 Type 2 diabetes mellitus without complications: Secondary | ICD-10-CM | POA: Diagnosis not present

## 2018-07-10 MED ORDER — FELODIPINE ER 10 MG PO TB24: 10.0000 mg | ORAL_TABLET | Freq: Every day | ORAL | 3 refills | Status: DC

## 2018-07-10 NOTE — Telephone Encounter (Signed)
Felodipine routed to [place] as requested. My chart message routed to patient to make aware.

## 2018-07-10 NOTE — Progress Notes (Signed)
Virtual Visit via Telephone Note   This visit type was conducted due to national recommendations for restrictions regarding the COVID-19 Pandemic (e.g. social distancing) in an effort to limit this patient's exposure and mitigate transmission in our community.  Due to his co-morbid illnesses, this patient is at least at moderate risk for complications without adequate follow up.  This format is felt to be most appropriate for this patient at this time.  The patient did not have access to video technology/had technical difficulties with video requiring transitioning to audio format only (telephone).  All issues noted in this document were discussed and addressed.  No physical exam could be performed with this format.  Please refer to the patient's chart for his  consent to telehealth for [facility].   I connected with  Robert Daniel on 07/10/18 by a video enabled telemedicine application and verified that I am speaking with the correct person using two identifiers. I discussed the limitations of evaluation and management by telemedicine. The patient expressed understanding and agreed to proceed.   Evaluation Performed:  Follow-up visit  Date:  07/10/2018   ID:  Robert Daniel, DOB [birth year], MRN [id number]  Patient Location:  [place] [address]   Provider location:   [facility], [place] office  PCP:  Olin Hauser, DO  Cardiologist:  [place], [place]   Chief Complaint: High blood pressure    History of Present Illness:    Robert Daniel is a 78 y.o. male who presents via audio/video conferencing for a telehealth visit today.   The patient does not symptoms concerning for COVID-19 infection (fever, chills, cough, or new SHORTNESS OF BREATH).   Patient has a past medical history of DM2, HBA1C 7.0 Hyperlipidemia Cardiac cath 2013 Nonsmoker Anxiety Chronic shortness of breath on exertion Labile hypertension/ white coat  hypertension  Who presents for follow-up of his hypertension  In follow-up today reports that he was taking his blood pressure wrong Was not sitting with arm at the right level and was taking too quickly not sitting for several minutes before taking his numbers When he rushed over with arm at appropriate height blood pressure 378H systolic With arm at right level after 3 minutes blood pressure down to 146 Still thinks it might be a little bit high  We have received numerous phone calls for persistently elevated pressure benazepril up to 40 mg daily, hydralazine up to 100 mg 4 times daily Clonidine 0.2 mg 4 times a day Felodipine 5 mg daily Isosorbide 60 mg twice daily  Intolerance to Metoprolol, HCTZ, Diovan   shortness of breath on exertion, but he does think shortness of breath is improving as blood pressure is coming down No regular exercise program  Hemoglobin A1c 6.6  Total cholesterol 200, discussed with him in detail  Other medical team includes Vascular Surgery - Dr Leotis Pain -  Cardiac Catheterization, reportedly 40% blockage but do not have rest of report Done in [facility]  - Nephrology - Dr Lavonia Dana (2013 [facility]) - had work-up for potential resistant hypertension, reportedly unremarkable testing based on PCP records with negative renal arteriogram no evidence of RAS  Previous problems on amlodipine which cause leg edema,    Prior CV studies:   The following studies were reviewed today:    Past Medical History:  Diagnosis Date  . Hyperlipidemia 02/16/2017  . Hypertension    Past Surgical History:  Procedure Laterality Date  . CARDIAC CATHETERIZATION  2013  .  CATARACT EXTRACTION Bilateral 04/21/2008   R eye 05-12-08     Current Meds  Medication Sig  . acetaminophen (TYLENOL) 500 MG tablet Take 500 mg by mouth every 6 (six) hours as needed.  . Ascorbic Acid (VITAMIN C) 1000 MG tablet Take 1,000 mg by mouth daily.  Marland Kitchen aspirin EC 81 MG tablet Take 81 mg  by mouth daily.  . benazepril (LOTENSIN) 40 MG tablet TAKE 1 TABLET BY MOUTH ONCE DAILY  . cloNIDine (CATAPRES) 0.2 MG tablet Take 1 tablet (0.2 mg total) by mouth 4 (four) times daily.  Marland Kitchen co-enzyme Q-10 30 MG capsule Take 30 mg by mouth 3 (three) times daily.  . felodipine (PLENDIL) 5 MG 24 hr tablet Take 1 tablet (5 mg total) by mouth daily.  Marland Kitchen gemfibrozil (LOPID) 600 MG tablet Take 1 tablet (600 mg total) by mouth 2 (two) times daily before a meal.  . hydrALAZINE (APRESOLINE) 100 MG tablet TAKE 1 TABLET BY MOUTH 4 TIMES DAILY  . ibuprofen (ADVIL,MOTRIN) 200 MG tablet Take 800 mg by mouth every 6 (six) hours as needed.  . isosorbide mononitrate (IMDUR) 60 MG 24 hr tablet Take 1 tablet (60 mg total) by mouth 2 (two) times daily.  Marland Kitchen lovastatin (MEVACOR) 20 MG tablet Take 1 tablet (20 mg total) by mouth at bedtime.  . Melatonin 3-2 MG TABS Take by mouth.  . metFORMIN (GLUCOPHAGE) 1000 MG tablet TAKE 1 TABLET BY MOUTH TWICE DAILY WITH A MEAL  . Multiple Vitamin (MULTIVITAMIN) tablet Take 1 tablet by mouth daily.  . Omega-3 Fatty Acids (OMEGA-3 FISH OIL) 1200 MG CAPS Take by mouth.  . potassium chloride (KLOR-CON) 20 MEQ packet Take 20 mEq by mouth as needed. Take with Torsemide  . torsemide (DEMADEX) 100 MG tablet Take 1 tablet (100 mg total) by mouth as needed.  . triamcinolone cream (KENALOG) 0.1 % Apply 1 application topically 2 (two) times daily as needed.  . Turmeric 500 MG TABS Take by mouth.     Allergies:   Patient has no known allergies.   Social History   Tobacco Use  . Smoking status: Never Smoker  . Smokeless tobacco: Never Used  Substance Use Topics  . Alcohol use: No    Comment: wine occasionally  . Drug use: No     Current Outpatient Medications on File Prior to Visit  Medication Sig Dispense Refill  . acetaminophen (TYLENOL) 500 MG tablet Take 500 mg by mouth every 6 (six) hours as needed.    . Ascorbic Acid (VITAMIN C) 1000 MG tablet Take 1,000 mg by mouth daily.     Marland Kitchen aspirin EC 81 MG tablet Take 81 mg by mouth daily.    . benazepril (LOTENSIN) 40 MG tablet TAKE 1 TABLET BY MOUTH ONCE DAILY 90 tablet 3  . cloNIDine (CATAPRES) 0.2 MG tablet Take 1 tablet (0.2 mg total) by mouth 4 (four) times daily. 360 tablet 3  . co-enzyme Q-10 30 MG capsule Take 30 mg by mouth 3 (three) times daily.    . felodipine (PLENDIL) 5 MG 24 hr tablet Take 1 tablet (5 mg total) by mouth daily. 90 tablet 3  . gemfibrozil (LOPID) 600 MG tablet Take 1 tablet (600 mg total) by mouth 2 (two) times daily before a meal. 180 tablet 1  . hydrALAZINE (APRESOLINE) 100 MG tablet TAKE 1 TABLET BY MOUTH 4 TIMES DAILY 360 tablet 3  . ibuprofen (ADVIL,MOTRIN) 200 MG tablet Take 800 mg by mouth every 6 (six) hours as needed.    Marland Kitchen  isosorbide mononitrate (IMDUR) 60 MG 24 hr tablet Take 1 tablet (60 mg total) by mouth 2 (two) times daily. 180 tablet 3  . lovastatin (MEVACOR) 20 MG tablet Take 1 tablet (20 mg total) by mouth at bedtime. 90 tablet 1  . Melatonin 3-2 MG TABS Take by mouth.    . metFORMIN (GLUCOPHAGE) 1000 MG tablet TAKE 1 TABLET BY MOUTH TWICE DAILY WITH A MEAL 180 tablet 1  . Multiple Vitamin (MULTIVITAMIN) tablet Take 1 tablet by mouth daily.    . Omega-3 Fatty Acids (OMEGA-3 FISH OIL) 1200 MG CAPS Take by mouth.    . potassium chloride (KLOR-CON) 20 MEQ packet Take 20 mEq by mouth as needed. Take with Torsemide 30 tablet 1  . torsemide (DEMADEX) 100 MG tablet Take 1 tablet (100 mg total) by mouth as needed. 30 tablet 1  . triamcinolone cream (KENALOG) 0.1 % Apply 1 application topically 2 (two) times daily as needed. 453.6 g 1  . Turmeric 500 MG TABS Take by mouth.    . nitroGLYCERIN (NITROSTAT) 0.4 MG SL tablet Place 1 tablet (0.4 mg total) under the tongue every 5 (five) minutes as needed for chest pain. (Patient not taking: Reported on 03/21/2017) 25 tablet 3   No current facility-administered medications on file prior to visit.      Family Hx: The patient's family history is  not on file.  ROS:   Please see the history of present illness.    Review of Systems  Constitutional: Negative.   Respiratory: Negative.   Cardiovascular: Negative.   Gastrointestinal: Negative.   Musculoskeletal: Negative.   Neurological: Negative.   Psychiatric/Behavioral: Negative.   All other systems reviewed and are negative.     Labs/Other Tests and Data Reviewed:    Recent Labs: 08/08/2017: ALT 18; BUN 29; Creat 1.27; Hemoglobin 13.0; Platelets 142; Potassium 5.0; Sodium 142   Recent Lipid Panel Lab Results  Component Value Date/Time   CHOL 202 (H) 08/08/2017 09:15 AM   TRIG 275 (H) 08/08/2017 09:15 AM   HDL 30 (L) 08/08/2017 09:15 AM   CHOLHDL 6.7 (H) 08/08/2017 09:15 AM   LDLCALC 130 (H) 08/08/2017 09:15 AM    Wt Readings from Last 3 Encounters:  05/23/18 282 lb 3.2 oz (128 kg)  02/27/18 279 lb (126.6 kg)  02/14/18 284 lb 6.4 oz (129 kg)     Exam:    Vital Signs: Vital signs may also be detailed in the HPI There were no vitals taken for this visit.  Wt Readings from Last 3 Encounters:  05/23/18 282 lb 3.2 oz (128 kg)  02/27/18 279 lb (126.6 kg)  02/14/18 284 lb 6.4 oz (129 kg)   Temp Readings from Last 3 Encounters:  05/23/18 97.8 F (36.6 C) (Oral)  02/14/18 98.3 F (36.8 C) (Oral)  08/15/17 98.2 F (36.8 C) (Oral)   BP Readings from Last 3 Encounters:  05/24/18 (!) 160/78  02/27/18 (!) 175/80  02/14/18 (!) 194/90   Pulse Readings from Last 3 Encounters:  05/23/18 71  02/27/18 80  02/14/18 86    146 /70s, pulse 70, respirations 16  Well nourished, well developed male in no acute distress. Constitutional:  oriented to person, place, and time. No distress.  Head: Normocephalic and atraumatic.  Eyes:  no discharge. No scleral icterus.  Neck: Normal range of motion. Neck supple.  Pulmonary/Chest: No audible wheezing, no distress, appears comfortable Musculoskeletal: Normal range of motion.  no  tenderness or deformity.  Neurological:    Coordination  normal. Full exam not performed Skin:  No rash Psychiatric:  normal mood and affect. behavior is normal. Thought content normal.    ASSESSMENT & PLAN:    Controlled type 2 diabetes mellitus without complication, without long-term current use of insulin ([place]) We have encouraged continued exercise, careful diet management in an effort to lose weight.  Resistant hypertension We will change the felodipine ER 5 mg up to felodipine ER 10 mg daily  Mixed hyperlipidemia Does not want cholesterol medication at this time, will discuss with him in follow-up  Shortness of breath Breathing slowly improving as blood pressure gets better Recommend weight loss, walking program   COVID-19 Education: The signs and symptoms of COVID-19 were discussed with the patient and how to seek care for testing (follow up with PCP or arrange E-visit).  The importance of social distancing was discussed today.  Patient Risk:   After full review of this patients clinical status, I feel that they are at least moderate risk at this time.  Time:   Today, I have spent 25 minutes with the patient with telehealth technology discussing the cardiac and medical problems/diagnoses detailed above   10 min spent reviewing the chart prior to patient visit today   Medication Adjustments/Labs and Tests Ordered: Current medicines are reviewed at length with the patient today.  Concerns regarding medicines are outlined above.   Tests Ordered: No tests ordered   Medication Changes: Changes as above   Disposition: Follow-up in 6 months   Signed, Ida Rogue, MD  07/10/2018 8:48 AM    [place] [facility] [address] [place] [address], [place], [place] [address]

## 2018-07-10 NOTE — Patient Instructions (Addendum)
Medication Instructions:  Your physician has recommended you make the following change in your medication:  1. CHANGED to Felodipine ER 10 mg once a day    If you need a refill on your cardiac medications before your next appointment, please call your pharmacy.    Lab work: No new labs needed   If you have labs (blood work) drawn today and your tests are completely normal, you will receive your results only by: Marland Kitchen MyChart Message (if you have MyChart) OR . A paper copy in the mail If you have any lab test that is abnormal or we need to change your treatment, we will call you to review the results.   Testing/Procedures: No new testing needed   Follow-Up: At [facility], you and your health needs are our priority.  As part of our continuing mission to provide you with exceptional heart care, we have created designated Provider Care Teams.  These Care Teams include your primary Cardiologist (physician) and Advanced Practice Providers (APPs -  Physician Assistants and Nurse Practitioners) who all work together to provide you with the care you need, when you need it.  . You will need a follow up appointment in 6 months .   Please call our office 2 months in advance to schedule this appointment.    . Providers on your designated Care Team:   . Murray Hodgkins, NP . Christell Faith, PA-C . Marrianne Mood, PA-C  Any Other Special Instructions Will Be Listed Below (If Applicable).  For educational health videos Log in to : [email or web address] Or : [email or web address], password : [email or web address]     How to Take Your Blood Pressure You can take your blood pressure at home with a machine. You may need to check your blood pressure at home:  To check if you have high blood pressure (hypertension).  To check your blood pressure over time.  To make sure your blood pressure medicine is working. Supplies needed: You will need a blood pressure machine, or monitor. You can buy one at a drugstore or  online. When choosing one:  Choose one with an arm cuff.  Choose one that wraps around your upper arm. Only one finger should fit between your arm and the cuff.  Do not choose one that measures your blood pressure from your wrist or finger. Your doctor can suggest a monitor. How to prepare Avoid these things for 30 minutes before checking your blood pressure:  Drinking caffeine.  Drinking alcohol.  Eating.  Smoking.  Exercising. Five minutes before checking your blood pressure:  Pee.  Sit in a dining chair. Avoid sitting in a soft couch or armchair.  Be quiet. Do not talk. How to take your blood pressure Follow the instructions that came with your machine. If you have a digital blood pressure monitor, these may be the instructions: 1. Sit up straight. 2. Place your feet on the floor. Do not cross your ankles or legs. 3. Rest your left arm at the level of your heart. You may rest it on a table, desk, or chair. 4. Pull up your shirt sleeve. 5. Wrap the blood pressure cuff around the upper part of your left arm. The cuff should be 1 inch (2.5 cm) above your elbow. It is best to wrap the cuff around bare skin. 6. Fit the cuff snugly around your arm. You should be able to place only one finger between the cuff and your arm. 7. Put the cord inside the groove of your elbow.  8. Press the power button. 9. Sit quietly while the cuff fills with air and loses air. 10. Write down the numbers on the screen. 11. Wait 2-3 minutes and then repeat steps 1-10. What do the numbers mean? Two numbers make up your blood pressure. The first number is called systolic pressure. The second is called diastolic pressure. An example of a blood pressure reading is "120 over 80" (or 120/80). If you are an adult and do not have a medical condition, use this guide to find out if your blood pressure is normal: Normal  First number: below 120.  Second number: below 80. Elevated  First number:  120-129.  Second number: below 80. Hypertension stage 1  First number: 130-139.  Second number: 80-89. Hypertension stage 2  First number: 140 or above.  Second number: 63 or above. Your blood pressure is above normal even if only the top or bottom number is above normal. Follow these instructions at home:  Check your blood pressure as often as your doctor tells you to.  Take your monitor to your next doctor's appointment. Your doctor will: ? Make sure you are using it correctly. ? Make sure it is working right.  Make sure you understand what your blood pressure numbers should be.  Tell your doctor if your medicines are causing side effects. Contact a doctor if:  Your blood pressure keeps being high. Get help right away if:  Your first blood pressure number is higher than 180.  Your second blood pressure number is higher than 120. This information is not intended to replace advice given to you by your health care provider. Make sure you discuss any questions you have with your health care provider. Document Released: 02/04/2008 Document Revised: 01/20/2016 Document Reviewed: 07/31/2015 Elsevier Interactive Patient Education  2019 Elsevier Inc.    Blood Pressure Record Sheet To take your blood pressure, you will need a blood pressure machine. You can buy a blood pressure machine (blood pressure monitor) at your clinic, drug store, or online. When choosing one, consider:  An automatic monitor that has an arm cuff.  A cuff that wraps snugly around your upper arm. You should be able to fit only one finger between your arm and the cuff.  A device that stores blood pressure reading results.  Do not choose a monitor that measures your blood pressure from your wrist or finger. Follow your health care provider's instructions for how to take your blood pressure. To use this form:  Get one reading in the morning (a.m.) before you take any medicines.  Get one reading in the  evening (p.m.) before supper.  Take at least 2 readings with each blood pressure check. This makes sure the results are correct. Wait 1-2 minutes between measurements.  Write down the results in the spaces on this form.  Repeat this once a week, or as told by your health care provider.  Make a follow-up appointment with your health care provider to discuss the results. Blood pressure log Date: _______________________  a.m. _____________________(1st reading) _____________________(2nd reading)  p.m. _____________________(1st reading) _____________________(2nd reading) Date: _______________________  a.m. _____________________(1st reading) _____________________(2nd reading)  p.m. _____________________(1st reading) _____________________(2nd reading) Date: _______________________  a.m. _____________________(1st reading) _____________________(2nd reading)  p.m. _____________________(1st reading) _____________________(2nd reading) Date: _______________________  a.m. _____________________(1st reading) _____________________(2nd reading)  p.m. _____________________(1st reading) _____________________(2nd reading) Date: _______________________  a.m. _____________________(1st reading) _____________________(2nd reading)  p.m. _____________________(1st reading) _____________________(2nd reading) This information is not intended to replace advice given to you by your health  care provider. Make sure you discuss any questions you have with your health care provider. Document Released: 11/20/2002 Document Revised: 02/21/2017 Document Reviewed: 02/21/2017 Elsevier Interactive Patient Education  2019 [place] DASH stands for "Dietary Approaches to Stop Hypertension." The DASH eating plan is a healthy eating plan that has been shown to reduce high blood pressure (hypertension). It may also reduce your risk for type 2 diabetes, heart disease, and stroke. The DASH eating  plan may also help with weight loss. What are tips for following this plan?  General guidelines  Avoid eating more than 2,300 mg (milligrams) of salt (sodium) a day. If you have hypertension, you may need to reduce your sodium intake to 1,500 mg a day.  Limit alcohol intake to no more than 1 drink a day for nonpregnant women and 2 drinks a day for men. One drink equals 12 oz of beer, 5 oz of wine, or 1 oz of hard liquor.  Work with your health care provider to maintain a healthy body weight or to lose weight. Ask what an ideal weight is for you.  Get at least 30 minutes of exercise that causes your heart to beat faster (aerobic exercise) most days of the week. Activities may include walking, swimming, or biking.  Work with your health care provider or diet and nutrition specialist (dietitian) to adjust your eating plan to your individual calorie needs. Reading food labels   Check food labels for the amount of sodium per serving. Choose foods with less than 5 percent of the Daily Value of sodium. Generally, foods with less than 300 mg of sodium per serving fit into this eating plan.  To find whole grains, look for the word "whole" as the first word in the ingredient list. Shopping  Buy products labeled as "low-sodium" or "no salt added."  Buy fresh foods. Avoid canned foods and premade or frozen meals. Cooking  Avoid adding salt when cooking. Use salt-free seasonings or herbs instead of table salt or sea salt. Check with your health care provider or pharmacist before using salt substitutes.  Do not fry foods. Cook foods using healthy methods such as baking, boiling, grilling, and broiling instead.  Cook with heart-healthy oils, such as olive, canola, soybean, or sunflower oil. Meal planning  Eat a balanced diet that includes: ? 5 or more servings of fruits and vegetables each day. At each meal, try to fill half of your plate with fruits and vegetables. ? Up to 6-8 servings of  whole grains each day. ? Less than 6 oz of lean meat, poultry, or fish each day. A 3-oz serving of meat is about the same size as a deck of cards. One egg equals 1 oz. ? 2 servings of low-fat dairy each day. ? A serving of nuts, seeds, or beans 5 times each week. ? Heart-healthy fats. Healthy fats called Omega-3 fatty acids are found in foods such as flaxseeds and coldwater fish, like sardines, salmon, and mackerel.  Limit how much you eat of the following: ? Canned or prepackaged foods. ? Food that is high in trans fat, such as fried foods. ? Food that is high in saturated fat, such as fatty meat. ? Sweets, desserts, sugary drinks, and other foods with added sugar. ? Full-fat dairy products.  Do not salt foods before eating.  Try to eat at least 2 vegetarian meals each week.  Eat more home-cooked food and less restaurant, buffet, and fast food.  When  eating at a restaurant, ask that your food be prepared with less salt or no salt, if possible. What foods are recommended? The items listed may not be a complete list. Talk with your dietitian about what dietary choices are best for you. Grains Whole-grain or whole-wheat bread. Whole-grain or whole-wheat pasta. Brown rice. [place]. Bulgur. Whole-grain and low-sodium cereals. Pita bread. Low-fat, low-sodium crackers. Whole-wheat flour tortillas. Vegetables Fresh or frozen vegetables (raw, steamed, roasted, or grilled). Low-sodium or reduced-sodium tomato and vegetable juice. Low-sodium or reduced-sodium tomato sauce and tomato paste. Low-sodium or reduced-sodium canned vegetables. Fruits All fresh, dried, or frozen fruit. Canned fruit in natural juice (without added sugar). Meat and other protein foods Skinless chicken or [place]. Ground chicken or [place]. Pork with fat trimmed off. Fish and seafood. Egg whites. Dried beans, peas, or lentils. Unsalted nuts, nut butters, and seeds. Unsalted canned beans. Lean cuts of beef with fat  trimmed off. Low-sodium, lean deli meat. Dairy Low-fat (1%) or fat-free (skim) milk. Fat-free, low-fat, or reduced-fat cheeses. Nonfat, low-sodium ricotta or cottage cheese. Low-fat or nonfat yogurt. Low-fat, low-sodium cheese. Fats and oils Soft margarine without trans fats. Vegetable oil. Low-fat, reduced-fat, or light mayonnaise and salad dressings (reduced-sodium). Canola, safflower, olive, soybean, and sunflower oils. Avocado. Seasoning and other foods Herbs. Spices. Seasoning mixes without salt. Unsalted popcorn and pretzels. Fat-free sweets. What foods are not recommended? The items listed may not be a complete list. Talk with your dietitian about what dietary choices are best for you. Grains Baked goods made with fat, such as croissants, muffins, or some breads. Dry pasta or rice meal packs. Vegetables Creamed or fried vegetables. Vegetables in a cheese sauce. Regular canned vegetables (not low-sodium or reduced-sodium). Regular canned tomato sauce and paste (not low-sodium or reduced-sodium). Regular tomato and vegetable juice (not low-sodium or reduced-sodium). Angie Fava. Olives. Fruits Canned fruit in a light or heavy syrup. Fried fruit. Fruit in cream or butter sauce. Meat and other protein foods Fatty cuts of meat. Ribs. Fried meat. Berniece Salines. Sausage. Bologna and other processed lunch meats. Salami. Fatback. Hotdogs. Bratwurst. Salted nuts and seeds. Canned beans with added salt. Canned or smoked fish. Whole eggs or egg yolks. Chicken or [place] with skin. Dairy Whole or 2% milk, cream, and half-and-half. Whole or full-fat cream cheese. Whole-fat or sweetened yogurt. Full-fat cheese. Nondairy creamers. Whipped toppings. Processed cheese and cheese spreads. Fats and oils Butter. Stick margarine. Lard. Shortening. Ghee. Bacon fat. Tropical oils, such as coconut, palm kernel, or palm oil. Seasoning and other foods Salted popcorn and pretzels. Onion salt, garlic salt, seasoned salt, table  salt, and sea salt. Worcestershire sauce. Tartar sauce. Barbecue sauce. Teriyaki sauce. Soy sauce, including reduced-sodium. Steak sauce. Canned and packaged gravies. Fish sauce. Oyster sauce. Cocktail sauce. Horseradish that you find on the shelf. Ketchup. Mustard. Meat flavorings and tenderizers. Bouillon cubes. Hot sauce and Tabasco sauce. Premade or packaged marinades. Premade or packaged taco seasonings. Relishes. Regular salad dressings. Where to find more information:  National Heart, Lung, and [place]: https://wilson-eaton.com/  American Heart Association: www.heart.org Summary  The DASH eating plan is a healthy eating plan that has been shown to reduce high blood pressure (hypertension). It may also reduce your risk for type 2 diabetes, heart disease, and stroke.  With the DASH eating plan, you should limit salt (sodium) intake to 2,300 mg a day. If you have hypertension, you may need to reduce your sodium intake to 1,500 mg a day.  When on the DASH eating plan, aim  to eat more fresh fruits and vegetables, whole grains, lean proteins, low-fat dairy, and heart-healthy fats.  Work with your health care provider or diet and nutrition specialist (dietitian) to adjust your eating plan to your individual calorie needs. This information is not intended to replace advice given to you by your health care provider. Make sure you discuss any questions you have with your health care provider. Document Released: 02/10/2011 Document Revised: 02/15/2016 Document Reviewed: 02/15/2016 Elsevier Interactive Patient Education  2019 [place] Heart-healthy meal planning includes:  Eating less unhealthy fats.  Eating more healthy fats.  Making other changes in your diet. Talk with your doctor or a diet specialist (dietitian) to create an eating plan that is right for you. What is my plan? Your doctor may recommend an eating plan that includes:  Total fat: ______% or  less of total calories a day.  Saturated fat: ______% or less of total calories a day.  Cholesterol: less than _________mg a day. What are tips for following this plan? Cooking Avoid frying your food. Try to bake, boil, grill, or broil it instead. You can also reduce fat by:  Removing the skin from poultry.  Removing all visible fats from meats.  Steaming vegetables in water or broth. Meal planning   At meals, divide your plate into four equal parts: ? Fill one-half of your plate with vegetables and green salads. ? Fill one-fourth of your plate with whole grains. ? Fill one-fourth of your plate with lean protein foods.  Eat 4-5 servings of vegetables per day. A serving of vegetables is: ? 1 cup of raw or cooked vegetables. ? 2 cups of raw leafy greens.  Eat 4-5 servings of fruit per day. A serving of fruit is: ? 1 medium whole fruit. ?  cup of dried fruit. ?  cup of fresh, frozen, or canned fruit. ?  cup of 100% fruit juice.  Eat more foods that have soluble fiber. These are apples, broccoli, carrots, beans, peas, and barley. Try to get 20-30 g of fiber per day.  Eat 4-5 servings of nuts, legumes, and seeds per week: ? 1 serving of dried beans or legumes equals  cup after being cooked. ? 1 serving of nuts is  cup. ? 1 serving of seeds equals 1 tablespoon. General information  Eat more home-cooked food. Eat less restaurant, buffet, and fast food.  Limit or avoid alcohol.  Limit foods that are high in starch and sugar.  Avoid fried foods.  Lose weight if you are overweight.  Keep track of how much salt (sodium) you eat. This is important if you have high blood pressure. Ask your doctor to tell you more about this.  Try to add vegetarian meals each week. Fats  Choose healthy fats. These include olive oil and canola oil, flaxseeds, walnuts, almonds, and seeds.  Eat more omega-3 fats. These include salmon, mackerel, sardines, tuna, flaxseed oil, and ground  flaxseeds. Try to eat fish at least 2 times each week.  Check food labels. Avoid foods with trans fats or high amounts of saturated fat.  Limit saturated fats. ? These are often found in animal products, such as meats, butter, and cream. ? These are also found in plant foods, such as palm oil, palm kernel oil, and coconut oil.  Avoid foods with partially hydrogenated oils in them. These have trans fats. Examples are stick margarine, some tub margarines, cookies, crackers, and other baked goods. What foods can I eat? Fruits  All fresh, canned (in natural juice), or frozen fruits. Vegetables Fresh or frozen vegetables (raw, steamed, roasted, or grilled). Green salads. Grains Most grains. Choose whole wheat and whole grains most of the time. Rice and pasta, including brown rice and pastas made with whole wheat. Meats and other proteins Lean, well-trimmed beef, veal, pork, and lamb. Chicken and [place] without skin. All fish and shellfish. Wild duck, rabbit, pheasant, and venison. Egg whites or low-cholesterol egg substitutes. Dried beans, peas, lentils, and tofu. Seeds and most nuts. Dairy Low-fat or nonfat cheeses, including ricotta and mozzarella. Skim or 1% milk that is liquid, powdered, or evaporated. Buttermilk that is made with low-fat milk. Nonfat or low-fat yogurt. Fats and oils Non-hydrogenated (trans-free) margarines. Vegetable oils, including soybean, sesame, sunflower, olive, peanut, safflower, corn, canola, and cottonseed. Salad dressings or mayonnaise made with a vegetable oil. Beverages Mineral water. Coffee and tea. Diet carbonated beverages. Sweets and desserts Sherbet, gelatin, and fruit ice. Small amounts of dark chocolate. Limit all sweets and desserts. Seasonings and condiments All seasonings and condiments. The items listed above may not be a complete list of foods and drinks you can eat. Contact a dietitian for more options. What foods should I avoid? Fruits Canned  fruit in heavy syrup. Fruit in cream or butter sauce. Fried fruit. Limit coconut. Vegetables Vegetables cooked in cheese, cream, or butter sauce. Fried vegetables. Grains Breads that are made with saturated or trans fats, oils, or whole milk. Croissants. Sweet rolls. Donuts. High-fat crackers, such as cheese crackers. Meats and other proteins Fatty meats, such as hot dogs, ribs, sausage, bacon, rib-eye roast or steak. High-fat deli meats, such as salami and bologna. Caviar. Domestic duck and goose. Organ meats, such as liver. Dairy Cream, sour cream, cream cheese, and creamed cottage cheese. Whole-milk cheeses. Whole or 2% milk that is liquid, evaporated, or condensed. Whole buttermilk. Cream sauce or high-fat cheese sauce. Yogurt that is made from whole milk. Fats and oils Meat fat, or shortening. Cocoa butter, hydrogenated oils, palm oil, coconut oil, palm kernel oil. Solid fats and shortenings, including bacon fat, salt pork, lard, and butter. Nondairy cream substitutes. Salad dressings with cheese or sour cream. Beverages Regular sodas and juice drinks with added sugar. Sweets and desserts Frosting. Pudding. Cookies. Cakes. Pies. Milk chocolate or white chocolate. Buttered syrups. Full-fat ice cream or ice cream drinks. The items listed above may not be a complete list of foods and drinks to avoid. Contact a dietitian for more information. Summary  Heart-healthy meal planning includes eating less unhealthy fats, eating more healthy fats, and making other changes in your diet.  Eat a balanced diet. This includes fruits and vegetables, low-fat or nonfat dairy, lean protein, nuts and legumes, whole grains, and heart-healthy oils and fats. This information is not intended to replace advice given to you by your health care provider. Make sure you discuss any questions you have with your health care provider. Document Released: 08/23/2011 Document Revised: 03/31/2017 Document Reviewed:  03/31/2017 Elsevier Interactive Patient Education  2019 Elsevier Inc.  Low-Sodium Eating Plan Sodium, which is an element that makes up salt, helps you maintain a healthy balance of fluids in your body. Too much sodium can increase your blood pressure and cause fluid and waste to be held in your body. Your health care provider or dietitian may recommend following this plan if you have high blood pressure (hypertension), kidney disease, liver disease, or heart failure. Eating less sodium can help lower your blood pressure, reduce swelling, and protect  your heart, liver, and kidneys. What are tips for following this plan? General guidelines  Most people on this plan should limit their sodium intake to 1,500-2,000 mg (milligrams) of sodium each day. Reading food labels   The Nutrition Facts label lists the amount of sodium in one serving of the food. If you eat more than one serving, you must multiply the listed amount of sodium by the number of servings.  Choose foods with less than 140 mg of sodium per serving.  Avoid foods with 300 mg of sodium or more per serving. Shopping  Look for lower-sodium products, often labeled as "low-sodium" or "no salt added."  Always check the sodium content even if foods are labeled as "unsalted" or "no salt added".  Buy fresh foods. ? Avoid canned foods and premade or frozen meals. ? Avoid canned, cured, or processed meats  Buy breads that have less than 80 mg of sodium per slice. Cooking  Eat more home-cooked food and less restaurant, buffet, and fast food.  Avoid adding salt when cooking. Use salt-free seasonings or herbs instead of table salt or sea salt. Check with your health care provider or pharmacist before using salt substitutes.  Cook with plant-based oils, such as canola, sunflower, or olive oil. Meal planning  When eating at a restaurant, ask that your food be prepared with less salt or no salt, if possible.  Avoid foods that  contain MSG (monosodium glutamate). MSG is sometimes added to [place] food, bouillon, and some canned foods. What foods are recommended? The items listed may not be a complete list. Talk with your dietitian about what dietary choices are best for you. Grains Low-sodium cereals, including oats, puffed wheat and rice, and shredded wheat. Low-sodium crackers. Unsalted rice. Unsalted pasta. Low-sodium bread. Whole-grain breads and whole-grain pasta. Vegetables Fresh or frozen vegetables. "No salt added" canned vegetables. "No salt added" tomato sauce and paste. Low-sodium or reduced-sodium tomato and vegetable juice. Fruits Fresh, frozen, or canned fruit. Fruit juice. Meats and other protein foods Fresh or frozen (no salt added) meat, poultry, seafood, and fish. Low-sodium canned tuna and salmon. Unsalted nuts. Dried peas, beans, and lentils without added salt. Unsalted canned beans. Eggs. Unsalted nut butters. Dairy Milk. Soy milk. Cheese that is naturally low in sodium, such as ricotta cheese, fresh mozzarella, or Swiss cheese Low-sodium or reduced-sodium cheese. Cream cheese. Yogurt. Fats and oils Unsalted butter. Unsalted margarine with no trans fat. Vegetable oils such as canola or olive oils. Seasonings and other foods Fresh and dried herbs and spices. Salt-free seasonings. Low-sodium mustard and ketchup. Sodium-free salad dressing. Sodium-free light mayonnaise. Fresh or refrigerated horseradish. Lemon juice. Vinegar. Homemade, reduced-sodium, or low-sodium soups. Unsalted popcorn and pretzels. Low-salt or salt-free chips. What foods are not recommended? The items listed may not be a complete list. Talk with your dietitian about what dietary choices are best for you. Grains Instant hot cereals. Bread stuffing, pancake, and biscuit mixes. Croutons. Seasoned rice or pasta mixes. Noodle soup cups. Boxed or frozen macaroni and cheese. Regular salted crackers. Self-rising  flour. Vegetables Sauerkraut, pickled vegetables, and relishes. Olives. [place] fries. Onion rings. Regular canned vegetables (not low-sodium or reduced-sodium). Regular canned tomato sauce and paste (not low-sodium or reduced-sodium). Regular tomato and vegetable juice (not low-sodium or reduced-sodium). Frozen vegetables in sauces. Meats and other protein foods Meat or fish that is salted, canned, smoked, spiced, or pickled. Bacon, ham, sausage, hotdogs, corned beef, chipped beef, packaged lunch meats, salt pork, jerky, pickled herring, anchovies, regular canned  tuna, sardines, salted nuts. Dairy Processed cheese and cheese spreads. Cheese curds. Blue cheese. Feta cheese. String cheese. Regular cottage cheese. Buttermilk. Canned milk. Fats and oils Salted butter. Regular margarine. Ghee. Bacon fat. Seasonings and other foods Onion salt, garlic salt, seasoned salt, table salt, and sea salt. Canned and packaged gravies. Worcestershire sauce. Tartar sauce. Barbecue sauce. Teriyaki sauce. Soy sauce, including reduced-sodium. Steak sauce. Fish sauce. Oyster sauce. Cocktail sauce. Horseradish that you find on the shelf. Regular ketchup and mustard. Meat flavorings and tenderizers. Bouillon cubes. Hot sauce and Tabasco sauce. Premade or packaged marinades. Premade or packaged taco seasonings. Relishes. Regular salad dressings. Salsa. Potato and tortilla chips. Corn chips and puffs. Salted popcorn and pretzels. Canned or dried soups. Pizza. Frozen entrees and pot pies. Summary  Eating less sodium can help lower your blood pressure, reduce swelling, and protect your heart, liver, and kidneys.  Most people on this plan should limit their sodium intake to 1,500-2,000 mg (milligrams) of sodium each day.  Canned, boxed, and frozen foods are high in sodium. Restaurant foods, fast foods, and pizza are also very high in sodium. You also get sodium by adding salt to food.  Try to cook at home, eat more fresh  fruits and vegetables, and eat less fast food, canned, processed, or prepared foods. This information is not intended to replace advice given to you by your health care provider. Make sure you discuss any questions you have with your health care provider. Document Released: 08/13/2001 Document Revised: 02/15/2016 Document Reviewed: 02/15/2016 Elsevier Interactive Patient Education  2019 [employer].

## 2018-07-10 NOTE — Telephone Encounter (Signed)
  Please resend the prescription for felodipine (PLENDIL) 10 MG 24 hr tablet to the [facility] in [place] because Dr Rockey Situ said he could get it cheaper there.

## 2018-07-18 DIAGNOSIS — E113393 Type 2 diabetes mellitus with moderate nonproliferative diabetic retinopathy without macular edema, bilateral: Secondary | ICD-10-CM | POA: Diagnosis not present

## 2018-07-18 LAB — HM DIABETES EYE EXAM

## 2018-08-03 ENCOUNTER — Other Ambulatory Visit: Payer: Self-pay | Admitting: Family Medicine

## 2018-08-03 DIAGNOSIS — E1169 Type 2 diabetes mellitus with other specified complication: Secondary | ICD-10-CM

## 2018-08-14 ENCOUNTER — Encounter: Payer: Medicare HMO | Admitting: Family Medicine

## 2018-08-17 ENCOUNTER — Other Ambulatory Visit: Payer: Self-pay | Admitting: Family Medicine

## 2018-08-17 DIAGNOSIS — I10 Essential (primary) hypertension: Secondary | ICD-10-CM

## 2018-08-28 ENCOUNTER — Other Ambulatory Visit: Payer: Self-pay

## 2018-08-28 ENCOUNTER — Other Ambulatory Visit: Payer: Medicare HMO

## 2018-08-28 DIAGNOSIS — E1121 Type 2 diabetes mellitus with diabetic nephropathy: Secondary | ICD-10-CM

## 2018-08-28 DIAGNOSIS — E785 Hyperlipidemia, unspecified: Secondary | ICD-10-CM

## 2018-08-28 DIAGNOSIS — E1169 Type 2 diabetes mellitus with other specified complication: Secondary | ICD-10-CM

## 2018-08-28 DIAGNOSIS — Z0000 Encounter for general adult medical examination without abnormal findings: Secondary | ICD-10-CM | POA: Diagnosis not present

## 2018-08-28 DIAGNOSIS — I10 Essential (primary) hypertension: Secondary | ICD-10-CM | POA: Diagnosis not present

## 2018-08-29 LAB — CBC WITH DIFFERENTIAL/PLATELET
Absolute Monocytes: 358 cells/uL (ref 200–950)
Basophils Absolute: 28 cells/uL (ref 0–200)
Basophils Relative: 0.5 %
Eosinophils Absolute: 550 cells/uL — ABNORMAL HIGH (ref 15–500)
Eosinophils Relative: 10 %
HCT: 38.6 % (ref 38.5–50.0)
Hemoglobin: 13 g/dL — ABNORMAL LOW (ref 13.2–17.1)
Lymphs Abs: 2035 cells/uL (ref 850–3900)
MCH: 29.9 pg (ref 27.0–33.0)
MCHC: 33.7 g/dL (ref 32.0–36.0)
MCV: 88.7 fL (ref 80.0–100.0)
MPV: 13.8 fL — ABNORMAL HIGH (ref 7.5–12.5)
Monocytes Relative: 6.5 %
Neutro Abs: 2530 cells/uL (ref 1500–7800)
Neutrophils Relative %: 46 %
Platelets: 135 10*3/uL — ABNORMAL LOW (ref 140–400)
RBC: 4.35 10*6/uL (ref 4.20–5.80)
RDW: 14 % (ref 11.0–15.0)
Total Lymphocyte: 37 %
WBC: 5.5 10*3/uL (ref 3.8–10.8)

## 2018-08-29 LAB — COMPLETE METABOLIC PANEL WITH GFR
AG Ratio: 1.8 (calc) (ref 1.0–2.5)
ALT: 18 U/L (ref 9–46)
AST: 23 U/L (ref 10–35)
Albumin: 4.5 g/dL (ref 3.6–5.1)
Alkaline phosphatase (APISO): 33 U/L — ABNORMAL LOW (ref 35–144)
BUN: 24 mg/dL (ref 7–25)
CO2: 23 mmol/L (ref 20–32)
Calcium: 9.7 mg/dL (ref 8.6–10.3)
Chloride: 101 mmol/L (ref 98–110)
Creat: 1.1 mg/dL (ref 0.70–1.18)
GFR, Est African American: 74 mL/min/{1.73_m2} (ref >=60)
GFR, Est Non African American: 64 mL/min/{1.73_m2} (ref >=60)
Globulin: 2.5 g/dL (calc) (ref 1.9–3.7)
Glucose, Bld: 242 mg/dL — ABNORMAL HIGH (ref 65–99)
Potassium: 4.3 mmol/L (ref 3.5–5.3)
Sodium: 139 mmol/L (ref 135–146)
Total Bilirubin: 0.5 mg/dL (ref 0.2–1.2)
Total Protein: 7 g/dL (ref 6.1–8.1)

## 2018-08-29 LAB — LIPID PANEL
Cholesterol: 195 mg/dL (ref <200)
HDL: 35 mg/dL — ABNORMAL LOW (ref >=40)
LDL Cholesterol (Calc): 121 mg/dL (calc) — ABNORMAL HIGH
Non-HDL Cholesterol (Calc): 160 mg/dL (calc) — ABNORMAL HIGH (ref <130)
Total CHOL/HDL Ratio: 5.6 (calc) — ABNORMAL HIGH (ref <5.0)
Triglycerides: 256 mg/dL — ABNORMAL HIGH (ref <150)

## 2018-08-29 LAB — HEMOGLOBIN A1C
Hgb A1c MFr Bld: 9.6 % of total Hgb — ABNORMAL HIGH (ref <5.7)
Mean Plasma Glucose: 229 (calc)
eAG (mmol/L): 12.7 (calc)

## 2018-08-29 LAB — PSA: PSA: 3.3 ng/mL (ref <=4.0)

## 2018-09-04 ENCOUNTER — Encounter: Payer: Self-pay | Admitting: Family Medicine

## 2018-09-04 ENCOUNTER — Other Ambulatory Visit: Payer: Self-pay

## 2018-09-04 ENCOUNTER — Ambulatory Visit (INDEPENDENT_AMBULATORY_CARE_PROVIDER_SITE_OTHER): Payer: Medicare HMO | Admitting: Family Medicine

## 2018-09-04 VITALS — BP 147/90 | HR 71 | Ht 75.0 in | Wt 278.0 lb

## 2018-09-04 DIAGNOSIS — Z0000 Encounter for general adult medical examination without abnormal findings: Secondary | ICD-10-CM

## 2018-09-04 DIAGNOSIS — I10 Essential (primary) hypertension: Secondary | ICD-10-CM

## 2018-09-04 DIAGNOSIS — E1121 Type 2 diabetes mellitus with diabetic nephropathy: Secondary | ICD-10-CM | POA: Diagnosis not present

## 2018-09-04 MED ORDER — RYBELSUS 3 MG PO TABS: 3.0000 mg | ORAL_TABLET | Freq: Every day | ORAL | 0 refills | Status: DC

## 2018-09-04 NOTE — Progress Notes (Signed)
Subjective:    Patient ID: Robert Daniel, male    DOB: May 20, 1940, 78 y.o.   MRN: [id number]  Robert Daniel is a 78 y.o. male presenting on 09/04/2018 for Annual Exam   HPI   Here for Annual Physical and Lab Review.  RESISTANT HTN Home BP readings still variable systolic BP - ranging 638 to 160 now, he states if exertion of walking it may increase further. He has not made any changes. It is usually better at home, and higher at doctors office. He is not worried about BP anymore. Followed by Cardiology he sends them readings as well Current Meds -Felodipine XL 10mg  daily, Benazepril 40mg  daily, Hydralazine 100mg  QID, Clonidine 0.2mg  QID - Tosemide 100mg  PRN with K - improves edema with this PRN takes occasionally about 1 x weekly or so, he wears compression as well with relief Reports good compliance, took meds today. Tolerating well, w/o complaints. Lifestyle: Weight gain again by our scale Denies chest pain or tightness actively or recently  CHRONIC DM, Type 2with Nephropathy CKD-III / Obesity BMI >34 Weight down >10 lbs in 3 months Updated A1c up to 9.6, previously 6.6, he has tried tumeric and glucoil. He has recently started the Glimepiride 4mg  daily back again, only had some low sugars if took twice a day. He has declined weekly injection GLP1 in past. Meds:Glimepiride4mg daily (RESTARTED), Metformin 1000mg  BID Reports good compliance. Tolerating well w/o side-effects Currently on ACEi - No history of neuropathy or numbness tingling Diet - tries to do low carb diet, not always, admits some sweets - He completed DM Eye Exam at [facility] - will request record Admits rare hypoglycemia - now seems resolved Denies polyuria, visual changes, numbness or tingling.   Depression screen [facility] 2/9 09/04/2018 02/14/2018 12/20/2016  Decreased Interest 0 0 0  Down, Depressed, Hopeless 0 0 0  PHQ - 2 Score 0 0 0  Altered sleeping - 0 -  Tired, decreased energy - 0 -   Change in appetite - 0 -  Feeling bad or failure about yourself  - 0 -  Trouble concentrating - 0 -  Moving slowly or fidgety/restless - 0 -  Suicidal thoughts - 0 -  PHQ-9 Score - 0 -  Difficult doing work/chores - Not difficult at all -    Past Medical History:  Diagnosis Date  . Hyperlipidemia 02/16/2017  . Hypertension    Past Surgical History:  Procedure Laterality Date  . CARDIAC CATHETERIZATION  2013  . CATARACT EXTRACTION Bilateral 04/21/2008   R eye 05-12-08   Social History   Socioeconomic History  . Marital status: Divorced    Spouse name: Not on file  . Number of children: Not on file  . Years of education: Graduate  . Highest education level: Not on file  Occupational History  . Not on file  Social Needs  . Financial resource strain: Not hard at all  . Food insecurity    Worry: Never true    Inability: Never true  . Transportation needs    Medical: No    Non-medical: No  Tobacco Use  . Smoking status: Never Smoker  . Smokeless tobacco: Never Used  Substance and Sexual Activity  . Alcohol use: No  . Drug use: No  . Sexual activity: Not on file  Lifestyle  . Physical activity    Days per week: 3 days    Minutes per session: 20 min  . Stress: Not at all  Relationships  .  Social connections    Talks on phone: More than three times a week    Gets together: More than three times a week    Attends religious service: More than 4 times per year    Active member of club or organization: No    Attends meetings of clubs or organizations: Never    Relationship status: Divorced  . Intimate partner violence    Fear of current or ex partner: No    Emotionally abused: No    Physically abused: No    Forced sexual activity: No  Other Topics Concern  . Not on file  Social History Narrative   Works part time Multimedia programmer in fall and spring      [employer]    No family history on file. Current Outpatient Medications on File Prior to Visit   Medication Sig  . acetaminophen (TYLENOL) 500 MG tablet Take 500 mg by mouth every 6 (six) hours as needed.  Marland Kitchen aspirin EC 81 MG tablet Take 81 mg by mouth daily.  . benazepril (LOTENSIN) 40 MG tablet TAKE 1 TABLET BY MOUTH ONCE DAILY  . cloNIDine (CATAPRES) 0.2 MG tablet Take 1 tablet by mouth 4 times daily  . co-enzyme Q-10 30 MG capsule Take 30 mg by mouth 3 (three) times daily.  . felodipine (PLENDIL) 10 MG 24 hr tablet Take 1 tablet (10 mg total) by mouth daily.  Marland Kitchen gemfibrozil (LOPID) 600 MG tablet TAKE 1 TABLET BY MOUTH TWICE DAILY BEFORE A MEAL  . glimepiride (AMARYL) 4 MG tablet TAKE 1 TABLET BY MOUTH ONCE DAILY WITH BREAKFAST  . hydrALAZINE (APRESOLINE) 100 MG tablet TAKE 1 TABLET BY MOUTH 4 TIMES DAILY  . lovastatin (MEVACOR) 20 MG tablet Take 1 tablet (20 mg total) by mouth at bedtime.  . metFORMIN (GLUCOPHAGE) 1000 MG tablet TAKE 1 TABLET BY MOUTH TWICE DAILY WITH A MEAL  . Multiple Vitamin (MULTIVITAMIN) tablet Take 1 tablet by mouth daily.  . Omega-3 Fatty Acids (OMEGA-3 FISH OIL) 1200 MG CAPS Take by mouth.  . Turmeric 500 MG TABS Take by mouth.  . Ascorbic Acid (VITAMIN C) 1000 MG tablet Take 1,000 mg by mouth daily.  Marland Kitchen ibuprofen (ADVIL,MOTRIN) 200 MG tablet Take 800 mg by mouth every 6 (six) hours as needed.  . isosorbide mononitrate (IMDUR) 60 MG 24 hr tablet Take 1 tablet (60 mg total) by mouth 2 (two) times daily. (Patient not taking: Reported on 09/04/2018)  . nitroGLYCERIN (NITROSTAT) 0.4 MG SL tablet Place 1 tablet (0.4 mg total) under the tongue every 5 (five) minutes as needed for chest pain. (Patient not taking: Reported on 03/21/2017)   No current facility-administered medications on file prior to visit.     Review of Systems Per HPI unless specifically indicated above      Objective:    BP (!) 147/90 (BP Location: Left Arm, Cuff Size: Normal)   Pulse 71   Ht 6\' 3"  (1.905 m)   Wt 278 lb (126.1 kg)   BMI 34.75 kg/m   Wt Readings from Last 3 Encounters:   09/04/18 278 lb (126.1 kg)  05/23/18 282 lb 3.2 oz (128 kg)  02/27/18 279 lb (126.6 kg)    Physical Exam Vitals signs and nursing note reviewed.  Constitutional:      General: He is not in acute distress.    Appearance: He is well-developed. He is not diaphoretic.     Comments: Well-appearing, comfortable, cooperative  HENT:     Head: Normocephalic  and atraumatic.  Eyes:     General:        Right eye: No discharge.        Left eye: No discharge.     Conjunctiva/sclera: Conjunctivae normal.  Neck:     Musculoskeletal: Normal range of motion and neck supple.     Thyroid: No thyromegaly.  Cardiovascular:     Rate and Rhythm: Normal rate and regular rhythm.     Heart sounds: Normal heart sounds. No murmur.  Pulmonary:     Effort: Pulmonary effort is normal. No respiratory distress.     Breath sounds: Normal breath sounds. No wheezing or rales.  Musculoskeletal: Normal range of motion.  Lymphadenopathy:     Cervical: No cervical adenopathy.  Skin:    General: Skin is warm and dry.     Findings: No erythema or rash.  Neurological:     Mental Status: He is alert and oriented to person, place, and time.  Psychiatric:        Behavior: Behavior normal.     Comments: Well groomed, good eye contact, normal speech and thoughts      Diabetic Foot Exam - Simple   Simple Foot Form Diabetic Foot exam was performed with the following findings: Yes 09/04/2018 11:14 AM  Visual Inspection No deformities, no ulcerations, no other skin breakdown bilaterally: Yes Sensation Testing Intact to touch and monofilament testing bilaterally: Yes Pulse Check Posterior Tibialis and Dorsalis pulse intact bilaterally: Yes Comments     Results for orders placed or performed in visit on 08/28/18  Lipid panel  Result Value Ref Range   Cholesterol 195 <200 mg/dL   HDL 35 (L) > OR = 40 mg/dL   Triglycerides 256 (H) <150 mg/dL   LDL Cholesterol (Calc) 121 (H) mg/dL (calc)   Total CHOL/HDL Ratio 5.6  (H) <5.0 (calc)   Non-HDL Cholesterol (Calc) 160 (H) <130 mg/dL (calc)  COMPLETE METABOLIC PANEL WITH GFR  Result Value Ref Range   Glucose, Bld 242 (H) 65 - 99 mg/dL   BUN 24 7 - 25 mg/dL   Creat 1.10 0.70 - 1.18 mg/dL   GFR, Est Non African American 64 > OR = 60 mL/min/1.48m2   GFR, Est African American 74 > OR = 60 mL/min/1.60m2   BUN/Creatinine Ratio NOT APPLICABLE 6 - 22 (calc)   Sodium 139 135 - 146 mmol/L   Potassium 4.3 3.5 - 5.3 mmol/L   Chloride 101 98 - 110 mmol/L   CO2 23 20 - 32 mmol/L   Calcium 9.7 8.6 - 10.3 mg/dL   Total Protein 7.0 6.1 - 8.1 g/dL   Albumin 4.5 3.6 - 5.1 g/dL   Globulin 2.5 1.9 - 3.7 g/dL (calc)   AG Ratio 1.8 1.0 - 2.5 (calc)   Total Bilirubin 0.5 0.2 - 1.2 mg/dL   Alkaline phosphatase (APISO) 33 (L) 35 - 144 U/L   AST 23 10 - 35 U/L   ALT 18 9 - 46 U/L  CBC with Differential/Platelet  Result Value Ref Range   WBC 5.5 3.8 - 10.8 Thousand/uL   RBC 4.35 4.20 - 5.80 Million/uL   Hemoglobin 13.0 (L) 13.2 - 17.1 g/dL   HCT 38.6 38.5 - 50.0 %   MCV 88.7 80.0 - 100.0 fL   MCH 29.9 27.0 - 33.0 pg   MCHC 33.7 32.0 - 36.0 g/dL   RDW 14.0 11.0 - 15.0 %   Platelets 135 (L) 140 - 400 Thousand/uL   MPV 13.8 (H) 7.5 -  12.5 fL   Neutro Abs 2,530 1,500 - 7,800 cells/uL   Lymphs Abs 2,035 850 - 3,900 cells/uL   Absolute Monocytes 358 200 - 950 cells/uL   Eosinophils Absolute 550 (H) 15 - 500 cells/uL   Basophils Absolute 28 0 - 200 cells/uL   Neutrophils Relative % 46 %   Total Lymphocyte 37.0 %   Monocytes Relative 6.5 %   Eosinophils Relative 10.0 %   Basophils Relative 0.5 %  Hemoglobin A1c  Result Value Ref Range   Hgb A1c MFr Bld 9.6 (H) <5.7 % of total Hgb   Mean Plasma Glucose 229 (calc)   eAG (mmol/L) 12.7 (calc)  PSA  Result Value Ref Range   PSA 3.3 < OR = 4.0 ng/mL      Assessment & Plan:   Problem List Items Addressed This Visit    Resistant hypertension    Clinically asymptomatic today, resistant issue often seems more anxious  related and white coat HTN related, with variable BP - Home avg 140-150/60-80s variable readings -in office readings range 170 to 032+ - No known complications - Prior work-up done by Vascular and Renal with negative secondary HTN work-up, negative RAS. Recent work-up negative Hyperaldosterone labs, TSH - Failed: Bystolic, Metoprolol, HCTZ, Diovan, Amlodipine - Followed by [facility] Cardiology  Plan:  1. Continue current BP regimen- continue Felodipine XL 10mg  daily, Benazepril 40mg , Clonidine 0.2mg  QID, Hydralazine 100mg  QID - torsemide PRN 2. Encourage improved lifestyle - low sodium diet, regular exercise 3. Continue monitor BP outside office, bring readings to next visit for review 4. Follow-up with Cardiology for further work-up and management as he is  Strict return precautions given to patient when to go to hospital ED if worsening symptoms with BP      Type 2 diabetes with nephropathy (HCC)    Worsening A1c up to 9.4, from prior controlled 6-7 range REDUCED Hypoglycemia now off sulfonylurea BID No significant hyperglycemia Complications - RARE HYPOGLYCEMIA - CKD-III in setting of HTN as well, and other including hyperlipidemia - increases risk of future cardiovascular complications  Plan:  1. May RESTART temporarily Glimepiride 4mg  daily with meal - in future will again DISCONTINUE if can take new med - NEW med Rybelsus ordered 3mg  daily 30 min before 1st meal, empty stomach, review benefit side effect GLP1, will try to get covered - declined GLP1 injection 2. Current therapy - Metformin 1000mg  BID 2. Encourage improved lifestyle - low carb, low sugar diet, reduce portion size, continue improving regular exercise 3. Check CBG, bring log to next visit for review 4. Continue ASA, ACEi, Statin 5. REquest DM Eye [place] already done 2020 / DM foot done  F/u 3 months if on new med rybelsus, if cannot afford cover - can return in 6 months      Relevant Medications   glimepiride  (AMARYL) 4 MG tablet   RYBELSUS 3 MG TABS   White coat syndrome with hypertension    Other Visit Diagnoses    Annual physical exam    -  Primary      Updated Health Maintenance information Reviewed recent lab results with patient Encouraged improvement to lifestyle with diet and exercise - Goal of weight loss   Meds ordered this encounter  Medications  . RYBELSUS 3 MG TABS    Sig: Take 3 mg by mouth daily before breakfast. Take 30 min prior to 1st meal, small sip water, no other med    Dispense:  30 tablet  Refill:  0     Follow up plan: Return in about 3 months (around 12/05/2018) for 3 month HTN, DM A1c.  Nobie Putnam, [facility] 09/04/2018, 11:14 AM

## 2018-09-04 NOTE — Assessment & Plan Note (Signed)
Worsening A1c up to 9.4, from prior controlled 6-7 range REDUCED Hypoglycemia now off sulfonylurea BID No significant hyperglycemia Complications - RARE HYPOGLYCEMIA - CKD-III in setting of HTN as well, and other including hyperlipidemia - increases risk of future cardiovascular complications  Plan:  1. May RESTART temporarily Glimepiride 4mg  daily with meal - in future will again DISCONTINUE if can take new med - NEW med Rybelsus ordered 3mg  daily 30 min before 1st meal, empty stomach, review benefit side effect GLP1, will try to get covered - declined GLP1 injection 2. Current therapy - Metformin 1000mg  BID 2. Encourage improved lifestyle - low carb, low sugar diet, reduce portion size, continue improving regular exercise 3. Check CBG, bring log to next visit for review 4. Continue ASA, ACEi, Statin 5. REquest DM Eye [place] already done 2020 / DM foot done  F/u 3 months if on new med rybelsus, if cannot afford cover - can return in 6 months

## 2018-09-04 NOTE — Assessment & Plan Note (Signed)
Clinically asymptomatic today, resistant issue often seems more anxious related and white coat HTN related, with variable BP - Home avg 140-150/60-80s variable readings -in office readings range 170 to 852+ - No known complications - Prior work-up done by Vascular and Renal with negative secondary HTN work-up, negative RAS. Recent work-up negative Hyperaldosterone labs, TSH - Failed: Bystolic, Metoprolol, HCTZ, Diovan, Amlodipine - Followed by [facility] Cardiology  Plan:  1. Continue current BP regimen- continue Felodipine XL 10mg  daily, Benazepril 40mg , Clonidine 0.2mg  QID, Hydralazine 100mg  QID - torsemide PRN 2. Encourage improved lifestyle - low sodium diet, regular exercise 3. Continue monitor BP outside office, bring readings to next visit for review 4. Follow-up with Cardiology for further work-up and management as he is  Strict return precautions given to patient when to go to hospital ED if worsening symptoms with BP

## 2018-09-04 NOTE — Patient Instructions (Addendum)
Thank you for coming to the office today.  Stay tuned 1 week, let us try to get med authorized.  [email or web address]  May be as low as 10$ a month, if not then we can go back to continuing Glimepiride 4mg  daily  In future may switch to injection  3 months follow up if you get the new medicine, if not then schedule for 6 month  Please schedule a Follow-up Appointment to: Return in about 3 months (around 12/05/2018) for 3 month HTN, DM A1c.  If you have any other questions or concerns, please feel free to call the office or send a message through [place]. You may also schedule an earlier appointment if necessary.  Additionally, you may be receiving a survey about your experience at our office within a few days to 1 week by e-mail or mail. We value your feedback.  Nobie Putnam, DO [place]

## 2018-09-11 ENCOUNTER — Other Ambulatory Visit: Payer: Self-pay | Admitting: Family Medicine

## 2018-09-11 DIAGNOSIS — E785 Hyperlipidemia, unspecified: Secondary | ICD-10-CM

## 2018-09-11 DIAGNOSIS — E1169 Type 2 diabetes mellitus with other specified complication: Secondary | ICD-10-CM

## 2018-09-17 ENCOUNTER — Telehealth: Payer: Self-pay | Admitting: Cardiovascular Disease

## 2018-09-17 MED ORDER — LABETALOL HCL 100 MG PO TABS: 100.0000 mg | ORAL_TABLET | Freq: Two times a day (BID) | ORAL | 5 refills | Status: DC

## 2018-09-17 NOTE — Telephone Encounter (Signed)
Patient has a question regarding Labetolol 100 mg

## 2018-09-17 NOTE — Telephone Encounter (Signed)
Spoke with the pt. Pt sts that he was able to pick up the labetolol today and will start taking it in the morning. He is worried about all of the BP medications that he is currently on and hopes to be able to get of some of them. He will start labetolol as previously instructed.  Pt adv to monitor his BP daily 2-3 hours after taking his medications. Adv him to sit a relax 53min prior to checking. Pt adv in 1 week to provide the readings for Dr.Gollan to review. Pt voiced appreciation for the call.

## 2018-09-17 NOTE — Addendum Note (Signed)
Addended by: Lamar Laundry on: 09/17/2018 07:56 AM   Modules accepted: Orders

## 2018-10-22 DIAGNOSIS — E1121 Type 2 diabetes mellitus with diabetic nephropathy: Secondary | ICD-10-CM | POA: Diagnosis not present

## 2018-10-24 DIAGNOSIS — R69 Illness, unspecified: Secondary | ICD-10-CM | POA: Diagnosis not present

## 2018-12-10 ENCOUNTER — Other Ambulatory Visit: Payer: Self-pay | Admitting: Cardiovascular Disease

## 2018-12-11 ENCOUNTER — Encounter: Payer: Self-pay | Admitting: Family Medicine

## 2018-12-31 ENCOUNTER — Telehealth: Payer: Self-pay | Admitting: Family Medicine

## 2018-12-31 NOTE — Telephone Encounter (Signed)
I called the patient to schedule AWV with Tiffany, but there was no answer and no option to leave a message.  If the patient calls back, please schedule AWV with Nurse Health Advisor at next available opening. Last AWV 08/08/17 VDM (DD)

## 2019-01-13 ENCOUNTER — Other Ambulatory Visit: Payer: Self-pay | Admitting: Family Medicine

## 2019-01-13 DIAGNOSIS — E1121 Type 2 diabetes mellitus with diabetic nephropathy: Secondary | ICD-10-CM

## 2019-01-19 ENCOUNTER — Other Ambulatory Visit: Payer: Self-pay | Admitting: Family Medicine

## 2019-01-19 DIAGNOSIS — E1169 Type 2 diabetes mellitus with other specified complication: Secondary | ICD-10-CM

## 2019-01-19 DIAGNOSIS — E785 Hyperlipidemia, unspecified: Secondary | ICD-10-CM

## 2019-01-19 DIAGNOSIS — E1121 Type 2 diabetes mellitus with diabetic nephropathy: Secondary | ICD-10-CM

## 2019-01-22 DIAGNOSIS — E113393 Type 2 diabetes mellitus with moderate nonproliferative diabetic retinopathy without macular edema, bilateral: Secondary | ICD-10-CM | POA: Diagnosis not present

## 2019-01-22 LAB — HM DIABETES EYE EXAM

## 2019-01-23 ENCOUNTER — Encounter: Payer: Self-pay | Admitting: Family Medicine

## 2019-01-28 ENCOUNTER — Other Ambulatory Visit: Payer: Self-pay | Admitting: *Deleted

## 2019-01-28 DIAGNOSIS — Z20822 Contact with and (suspected) exposure to covid-19: Secondary | ICD-10-CM

## 2019-01-30 LAB — NOVEL CORONAVIRUS, NAA: SARS-CoV-2, NAA: NOT DETECTED

## 2019-02-11 ENCOUNTER — Telehealth: Payer: Self-pay | Admitting: Family Medicine

## 2019-02-11 NOTE — Telephone Encounter (Signed)
I left a message asking the patient to call and schedule AWV with Tiffany. Last AWV 08/08/17 so patient can be scheduled at next available opening. VDM (DD)

## 2019-02-19 ENCOUNTER — Ambulatory Visit (INDEPENDENT_AMBULATORY_CARE_PROVIDER_SITE_OTHER): Payer: Medicare HMO

## 2019-02-19 VITALS — Ht 75.0 in | Wt 279.0 lb

## 2019-02-19 DIAGNOSIS — Z0000 Encounter for general adult medical examination without abnormal findings: Secondary | ICD-10-CM

## 2019-02-19 NOTE — Patient Instructions (Signed)
[name] , Thank you for taking time to come for your Medicare Wellness Visit. I appreciate your ongoing commitment to your health goals. Please review the following plan we discussed and let me know if I can assist you in the future.   Screening recommendations/referrals: Colonoscopy: no longer required  Recommended yearly ophthalmology/optometry visit for glaucoma screening and checkup Recommended yearly dental visit for hygiene and checkup  Vaccinations: Influenza vaccine: up to date Pneumococcal vaccine: up to date Tdap vaccine: up to date Shingles vaccine:shingrix completed  Advanced directives: mailed information   Conditions/risks identified: diabetic   Next appointment: follow up in one year for your annual wellness visit   Preventive Care 24 Years and Older, Male Preventive care refers to lifestyle choices and visits with your health care provider that can promote health and wellness. What does preventive care include?  A yearly physical exam. This is also called an annual well check.  Dental exams once or twice a year.  Routine eye exams. Ask your health care provider how often you should have your eyes checked.  Personal lifestyle choices, including:  Daily care of your teeth and gums.  Regular physical activity.  Eating a healthy diet.  Avoiding tobacco and drug use.  Limiting alcohol use.  Practicing safe sex.  Taking low doses of aspirin every day.  Taking vitamin and mineral supplements as recommended by your health care provider. What happens during an annual well check? The services and screenings done by your health care provider during your annual well check will depend on your age, overall health, lifestyle risk factors, and family history of disease. Counseling  Your health care provider may ask you questions about your:  Alcohol use.  Tobacco use.  Drug use.  Emotional well-being.  Home and relationship well-being.  Sexual  activity.  Eating habits.  History of falls.  Memory and ability to understand (cognition).  Work and work Statistician. Screening  You may have the following tests or measurements:  Height, weight, and BMI.  Blood pressure.  Lipid and cholesterol levels. These may be checked every 5 years, or more frequently if you are over 55 years old.  Skin check.  Lung cancer screening. You may have this screening every year starting at age 38 if you have a 30-pack-year history of smoking and currently smoke or have quit within the past 15 years.  Fecal occult blood test (FOBT) of the stool. You may have this test every year starting at age 53.  Flexible sigmoidoscopy or colonoscopy. You may have a sigmoidoscopy every 5 years or a colonoscopy every 10 years starting at age 24.  Prostate cancer screening. Recommendations will vary depending on your family history and other risks.  Hepatitis C blood test.  Hepatitis B blood test.  Sexually transmitted disease (STD) testing.  Diabetes screening. This is done by checking your blood sugar (glucose) after you have not eaten for a while (fasting). You may have this done every 1-3 years.  Abdominal aortic aneurysm (AAA) screening. You may need this if you are a current or former smoker.  Osteoporosis. You may be screened starting at age 69 if you are at high risk. Talk with your health care provider about your test results, treatment options, and if necessary, the need for more tests. Vaccines  Your health care provider may recommend certain vaccines, such as:  Influenza vaccine. This is recommended every year.  Tetanus, diphtheria, and acellular pertussis (Tdap, Td) vaccine. You may need a Td booster every  10 years.  Zoster vaccine. You may need this after age 76.  Pneumococcal 13-valent conjugate (PCV13) vaccine. One dose is recommended after age 82.  Pneumococcal polysaccharide (PPSV23) vaccine. One dose is recommended after age  87. Talk to your health care provider about which screenings and vaccines you need and how often you need them. This information is not intended to replace advice given to you by your health care provider. Make sure you discuss any questions you have with your health care provider. Document Released: 03/20/2015 Document Revised: 11/11/2015 Document Reviewed: 12/23/2014 Elsevier Interactive Patient Education  2017 [place] Prevention in the Home Falls can cause injuries. They can happen to people of all ages. There are many things you can do to make your home safe and to help prevent falls. What can I do on the outside of my home?  Regularly fix the edges of walkways and driveways and fix any cracks.  Remove anything that might make you trip as you walk through a door, such as a raised step or threshold.  Trim any bushes or trees on the path to your home.  Use bright outdoor lighting.  Clear any walking paths of anything that might make someone trip, such as rocks or tools.  Regularly check to see if handrails are loose or broken. Make sure that both sides of any steps have handrails.  Any raised decks and porches should have guardrails on the edges.  Have any leaves, snow, or ice cleared regularly.  Use sand or salt on walking paths during winter.  Clean up any spills in your garage right away. This includes oil or grease spills. What can I do in the bathroom?  Use night lights.  Install grab bars by the toilet and in the tub and shower. Do not use towel bars as grab bars.  Use non-skid mats or decals in the tub or shower.  If you need to sit down in the shower, use a plastic, non-slip stool.  Keep the floor dry. Clean up any water that spills on the floor as soon as it happens.  Remove soap buildup in the tub or shower regularly.  Attach bath mats securely with double-sided non-slip rug tape.  Do not have throw rugs and other things on the floor that can make  you trip. What can I do in the bedroom?  Use night lights.  Make sure that you have a light by your bed that is easy to reach.  Do not use any sheets or blankets that are too big for your bed. They should not hang down onto the floor.  Have a firm chair that has side arms. You can use this for support while you get dressed.  Do not have throw rugs and other things on the floor that can make you trip. What can I do in the kitchen?  Clean up any spills right away.  Avoid walking on wet floors.  Keep items that you use a lot in easy-to-reach places.  If you need to reach something above you, use a strong step stool that has a grab bar.  Keep electrical cords out of the way.  Do not use floor polish or wax that makes floors slippery. If you must use wax, use non-skid floor wax.  Do not have throw rugs and other things on the floor that can make you trip. What can I do with my stairs?  Do not leave any items on the stairs.  Make sure  that there are handrails on both sides of the stairs and use them. Fix handrails that are broken or loose. Make sure that handrails are as long as the stairways.  Check any carpeting to make sure that it is firmly attached to the stairs. Fix any carpet that is loose or worn.  Avoid having throw rugs at the top or bottom of the stairs. If you do have throw rugs, attach them to the floor with carpet tape.  Make sure that you have a light switch at the top of the stairs and the bottom of the stairs. If you do not have them, ask someone to add them for you. What else can I do to help prevent falls?  Wear shoes that:  Do not have high heels.  Have rubber bottoms.  Are comfortable and fit you well.  Are closed at the toe. Do not wear sandals.  If you use a stepladder:  Make sure that it is fully opened. Do not climb a closed stepladder.  Make sure that both sides of the stepladder are locked into place.  Ask someone to hold it for you, if  possible.  Clearly mark and make sure that you can see:  Any grab bars or handrails.  First and last steps.  Where the edge of each step is.  Use tools that help you move around (mobility aids) if they are needed. These include:  Canes.  Walkers.  Scooters.  Crutches.  Turn on the lights when you go into a dark area. Replace any light bulbs as soon as they burn out.  Set up your furniture so you have a clear path. Avoid moving your furniture around.  If any of your floors are uneven, fix them.  If there are any pets around you, be aware of where they are.  Review your medicines with your doctor. Some medicines can make you feel dizzy. This can increase your chance of falling. Ask your doctor what other things that you can do to help prevent falls. This information is not intended to replace advice given to you by your health care provider. Make sure you discuss any questions you have with your health care provider. Document Released: 12/18/2008 Document Revised: 07/30/2015 Document Reviewed: 03/28/2014 Elsevier Interactive Patient Education  2017 [employer].

## 2019-02-19 NOTE — Progress Notes (Signed)
Subjective:   Robert Daniel is a 78 y.o. male who presents for Medicare Annual/Subsequent preventive examination.  This visit is being conducted via phone call  - after an attmept to do on video chat - due to the COVID-19 pandemic. This patient has given me verbal consent via phone to conduct this visit, patient states they are participating from their home address. Some vital signs may be absent or patient reported.   Patient identification: identified by name, DOB, and current address.    Review of Systems:   Cardiac Risk Factors include: advanced age (>62men, >74 women);dyslipidemia;hypertension;male gender;obesity (BMI >30kg/m2)     Objective:    Vitals: Ht 6\' 3"  (1.905 m)   Wt 279 lb (126.6 kg)   BMI 34.87 kg/m   Body mass index is 34.87 kg/m.  Advanced Directives 02/19/2019 08/08/2017  Does Patient Have a Medical Advance Directive? No No  Would patient like information on creating a medical advance directive? - Yes (MAU/Ambulatory/Procedural Areas - Information given)    Tobacco Social History   Tobacco Use  Smoking Status Never Smoker  Smokeless Tobacco Never Used     Counseling given: Not Answered   Clinical Intake:  Pre-visit preparation completed: Yes  Pain : 0-10 Pain Score: 2  Pain Type: Chronic pain Pain Location: Back Pain Orientation: Lower Pain Descriptors / Indicators: Aching Pain Onset: More than a month ago Pain Frequency: Constant     Nutritional Status: BMI > 30  Obese Nutritional Risks: None Diabetes: No  How often do you need to have someone help you when you read instructions, pamphlets, or other written materials from your doctor or pharmacy?: 1 - Never  Interpreter Needed?: No  Information entered by :: Tiffany Hill,LPN  Past Medical History:  Diagnosis Date  . Hyperlipidemia 02/16/2017  . Hypertension    Past Surgical History:  Procedure Laterality Date  . CARDIAC CATHETERIZATION  2013  . CATARACT EXTRACTION Bilateral  04/21/2008   R eye 05-12-08   History reviewed. No pertinent family history. Social History   Socioeconomic History  . Marital status: Divorced    Spouse name: Not on file  . Number of children: Not on file  . Years of education: Graduate  . Highest education level: Not on file  Occupational History  . Not on file  Tobacco Use  . Smoking status: Never Smoker  . Smokeless tobacco: Never Used  Substance and Sexual Activity  . Alcohol use: No    Comment: wine occasionaly   . Drug use: No  . Sexual activity: Not on file  Other Topics Concern  . Not on file  Social History Narrative   Works part time Multimedia programmer in fall and spring      [employer]    Social Determinants of Health   Financial Resource Strain:   . Difficulty of Paying Living Expenses: Not on file  Food Insecurity:   . Worried About Charity fundraiser in the Last Year: Not on file  . Ran Out of Food in the Last Year: Not on file  Transportation Needs:   . Lack of Transportation (Medical): Not on file  . Lack of Transportation (Non-Medical): Not on file  Physical Activity:   . Days of Exercise per Week: Not on file  . Minutes of Exercise per Session: Not on file  Stress:   . Feeling of Stress : Not on file  Social Connections:   . Frequency of Communication with Friends and Family: Not on  file  . Frequency of Social Gatherings with Friends and Family: Not on file  . Attends Religious Services: Not on file  . Active Member of Clubs or Organizations: Not on file  . Attends Archivist Meetings: Not on file  . Marital Status: Not on file    Outpatient Encounter Medications as of 02/19/2019  Medication Sig  . acetaminophen (TYLENOL) 500 MG tablet Take 500 mg by mouth every 6 (six) hours as needed.  . Ascorbic Acid (VITAMIN C) 1000 MG tablet Take 1,000 mg by mouth daily.  Marland Kitchen aspirin EC 81 MG tablet Take 81 mg by mouth daily.  . benazepril (LOTENSIN) 40 MG tablet TAKE 1 TABLET BY MOUTH  ONCE DAILY  . cloNIDine (CATAPRES) 0.2 MG tablet Take 1 tablet by mouth 4 times daily  . felodipine (PLENDIL) 10 MG 24 hr tablet Take 1 tablet (10 mg total) by mouth daily.  Marland Kitchen gemfibrozil (LOPID) 600 MG tablet TAKE 1 TABLET BY MOUTH TWICE DAILY BEFORE A MEAL  . glimepiride (AMARYL) 4 MG tablet Take 1 tablet by mouth once daily with breakfast  . hydrALAZINE (APRESOLINE) 100 MG tablet TAKE 1 TABLET BY MOUTH 4 TIMES DAILY  . ibuprofen (ADVIL,MOTRIN) 200 MG tablet Take 800 mg by mouth every 6 (six) hours as needed.  . isosorbide mononitrate (IMDUR) 60 MG 24 hr tablet Take 1 tablet (60 mg total) by mouth 2 (two) times daily.  Marland Kitchen labetalol (NORMODYNE) 100 MG tablet Take 1 tablet by mouth twice daily  . lovastatin (MEVACOR) 20 MG tablet TAKE 1 TABLET BY MOUTH AT BEDTIME  . metFORMIN (GLUCOPHAGE) 1000 MG tablet TAKE 1 TABLET BY MOUTH TWICE DAILY WITH A MEAL  . Multiple Vitamin (MULTIVITAMIN) tablet Take 1 tablet by mouth daily.  . Omega-3 Fatty Acids (OMEGA-3 FISH OIL) 1200 MG CAPS Take by mouth.  . Turmeric 500 MG TABS Take by mouth.  . co-enzyme Q-10 30 MG capsule Take 30 mg by mouth 3 (three) times daily.  . nitroGLYCERIN (NITROSTAT) 0.4 MG SL tablet Place 1 tablet (0.4 mg total) under the tongue every 5 (five) minutes as needed for chest pain. (Patient not taking: Reported on 03/21/2017)  . [DISCONTINUED] RYBELSUS 3 MG TABS Take 3 mg by mouth daily before breakfast. Take 30 min prior to 1st meal, small sip water, no other med (Patient not taking: Reported on 02/19/2019)   No facility-administered encounter medications on file as of 02/19/2019.    Activities of Daily Living In your present state of health, do you have any difficulty performing the following activities: 02/19/2019  Hearing? N  Comment no hearing aids  Vision? N  Comment eyeglasses, [email or web address]  Difficulty concentrating or making decisions? N  Walking or climbing stairs? Y  Comment SOB  Dressing or bathing? N  Doing errands,  shopping? N  Preparing Food and eating ? N  Using the Toilet? N  In the past six months, have you accidently leaked urine? N  Do you have problems with loss of bowel control? N  Managing your Medications? N  Managing your Finances? N  Housekeeping or managing your Housekeeping? N  Some recent data might be hidden    Patient Care Team: Olin Hauser, DO as PCP - General (Family Medicine)   Assessment:   This is a routine wellness examination for Nohlan.  Exercise Activities and Dietary recommendations Current Exercise Habits: The patient does not participate in regular exercise at present, Exercise limited by: None identified  Goals    .  DIET - INCREASE WATER INTAKE     Recommend continue drinking at least 6-8 glasses of water a day        Fall Risk: Fall Risk  02/19/2019 08/08/2017 12/20/2016  Falls in the past year? 0 No No  Number falls in past yr: 0 - -  Injury with Fall? 0 - -    FALL RISK PREVENTION PERTAINING TO THE HOME:  Any stairs in or around the home? Yes  If so, are there any without handrails? No   Home free of loose throw rugs in walkways, pet beds, electrical cords, etc? Yes  Adequate lighting in your home to reduce risk of falls? Yes   ASSISTIVE DEVICES UTILIZED TO PREVENT FALLS:  Life alert? No  Use of a cane, walker or w/c? No  Grab bars in the bathroom? No  Shower chair or bench in shower? No  Elevated toilet seat or a handicapped toilet? No   TIMED UP AND GO:  Unable to perform   Depression Screen PHQ 2/9 Scores 02/19/2019 09/04/2018 02/14/2018 12/20/2016  PHQ - 2 Score 0 0 0 0  PHQ- 9 Score - - 0 -    Cognitive Function     6CIT Screen 08/08/2017  What Year? 0 points  What month? 0 points  What time? 0 points  Count back from 20 0 points  Months in reverse 0 points  Repeat phrase 2 points  Total Score 2    Immunization History  Administered Date(s) Administered  . Influenza, High Dose Seasonal PF 11/01/2017,  10/24/2018  . Influenza-Unspecified 10/28/2015, 11/01/2017, 10/24/2018  . Pneumococcal Conjugate-13 12/14/2013  . Pneumococcal-Unspecified 10/08/2012, 10/28/2015  . Td 10/27/2010  . Zoster Recombinat (Shingrix) 10/08/2012, 12/04/2018, 02/02/2019    Qualifies for Shingles Vaccine? Shingrix completed   Tdap: up to date   Flu Vaccine: up to date   Pneumococcal Vaccine: up to date   Screening Tests Health Maintenance  Topic Date Due  . HEMOGLOBIN A1C  02/27/2019  . FOOT EXAM  09/04/2019  . OPHTHALMOLOGY EXAM  01/22/2020  . TETANUS/TDAP  10/26/2020  . INFLUENZA VACCINE  Completed   Cancer Screenings:  Colorectal Screening: no longer required   Lung Cancer Screening: (Low Dose CT Chest recommended if Age 34-80 years, 30 pack-year currently smoking OR have quit w/in 15years.) does not qualify.    Additional Screening:  Hepatitis C Screening: does not qualify  Vision Screening: Recommended annual ophthalmology exams for early detection of glaucoma and other disorders of the eye. Is the patient up to date with their annual eye exam?  Yes  Who is the provider or what is the name of the office in which the pt attends annual eye exams?  Dr. Gloriann Loan  Dental Screening: Recommended annual dental exams for proper oral hygiene  Community Resource Referral:  CRR required this visit?  No        Plan:  I have personally reviewed and addressed the Medicare Annual Wellness questionnaire and have noted the following in the patient's chart:  A. Medical and social history B. Use of alcohol, tobacco or illicit drugs  C. Current medications and supplements D. Functional ability and status E.  Nutritional status F.  Physical activity G. Advance directives H. List of other physicians I.  Hospitalizations, surgeries, and ER visits in previous 12 months J.  [place] such as hearing and vision if needed, cognitive and depression L. Referrals and appointments   In addition, I  have reviewed and discussed with patient  certain preventive protocols, quality metrics, and best practice recommendations. A written personalized care plan for preventive services as well as general preventive health recommendations were provided to patient.   Signed,   Bevelyn Ngo, LPN  [id number] Nurse Health Advisor   Nurse Notes: none

## 2019-03-05 ENCOUNTER — Other Ambulatory Visit: Payer: Self-pay | Admitting: Cardiovascular Disease

## 2019-03-05 DIAGNOSIS — I10 Essential (primary) hypertension: Secondary | ICD-10-CM

## 2019-03-05 DIAGNOSIS — I1A0 Resistant hypertension: Secondary | ICD-10-CM

## 2019-03-11 ENCOUNTER — Other Ambulatory Visit: Payer: Self-pay | Admitting: Cardiovascular Disease

## 2019-03-11 DIAGNOSIS — I10 Essential (primary) hypertension: Secondary | ICD-10-CM

## 2019-03-12 MED ORDER — FELODIPINE ER 10 MG PO TB24: 10.0000 mg | ORAL_TABLET | Freq: Every day | ORAL | 3 refills | Status: AC

## 2019-03-28 ENCOUNTER — Telehealth: Payer: Self-pay | Admitting: Cardiovascular Disease

## 2019-04-02 ENCOUNTER — Encounter: Payer: Self-pay | Admitting: Family

## 2019-04-02 ENCOUNTER — Other Ambulatory Visit: Payer: Self-pay

## 2019-04-02 ENCOUNTER — Telehealth (INDEPENDENT_AMBULATORY_CARE_PROVIDER_SITE_OTHER): Payer: PPO | Admitting: Family

## 2019-04-02 VITALS — BP 163/72 | HR 63 | Ht 75.0 in | Wt 278.0 lb

## 2019-04-02 DIAGNOSIS — E1169 Type 2 diabetes mellitus with other specified complication: Secondary | ICD-10-CM

## 2019-04-02 DIAGNOSIS — I10 Essential (primary) hypertension: Secondary | ICD-10-CM

## 2019-04-02 DIAGNOSIS — G4700 Insomnia, unspecified: Secondary | ICD-10-CM

## 2019-04-02 DIAGNOSIS — E785 Hyperlipidemia, unspecified: Secondary | ICD-10-CM

## 2019-04-02 MED ORDER — LABETALOL HCL 100 MG PO TABS: 100.0000 mg | ORAL_TABLET | Freq: Two times a day (BID) | ORAL | 1 refills | Status: DC

## 2019-04-02 MED ORDER — ISOSORBIDE MONONITRATE ER 60 MG PO TB24: 60.0000 mg | ORAL_TABLET | Freq: Two times a day (BID) | ORAL | 1 refills | Status: DC

## 2019-04-02 MED ORDER — HYDRALAZINE HCL 100 MG PO TABS: 100.0000 mg | ORAL_TABLET | Freq: Four times a day (QID) | ORAL | 1 refills | Status: DC

## 2019-04-02 MED ORDER — BENAZEPRIL HCL 40 MG PO TABS: 40.0000 mg | ORAL_TABLET | Freq: Every day | ORAL | 1 refills | Status: DC
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
## 2019-04-02 NOTE — Patient Instructions (Signed)
Medication Instructions:  No medication changes today.  You may use an extra half tablet of your Labetolol as needed for systolic blood pressure (the top number) more than 160.   A refill of your Imdur, Benazepril, Hydralazine, and Labetolol were sent to [facility]. Dr. Parks Ranger refills your Clonidine, so simply let his office know when you need a refill.  *If you need a refill on your cardiac medications before your next appointment, please call your pharmacy*  Lab Work: None ordered today.   Testing/Procedures: None ordered today  Follow-Up: At [facility], you and your health needs are our priority.  As part of our continuing mission to provide you with exceptional heart care, we have created designated Provider Care Teams.  These Care Teams include your primary Cardiologist (physician) and Advanced Practice Providers (APPs -  Physician Assistants and Nurse Practitioners) who all work together to provide you with the care you need, when you need it.  Your next appointment:   6 month(s)  The format for your next appointment:   In Person  Provider:    You may see Ida Rogue, MD or one of the following Advanced Practice Providers on your designated Care Team:    Murray Hodgkins, NP  Christell Faith, PA-C  Marrianne Mood, PA-C  Other Instructions  Recommend following up with Dr. Parks Ranger regarding your difficulty sleeping.

## 2019-05-02 NOTE — Telephone Encounter (Signed)
consented

## 2019-05-08 ENCOUNTER — Other Ambulatory Visit: Payer: Self-pay | Admitting: Cardiovascular Disease

## 2019-05-08 DIAGNOSIS — I10 Essential (primary) hypertension: Secondary | ICD-10-CM

## 2019-05-09 ENCOUNTER — Other Ambulatory Visit: Payer: Self-pay

## 2019-05-09 DIAGNOSIS — I10 Essential (primary) hypertension: Secondary | ICD-10-CM

## 2019-05-09 MED ORDER — LABETALOL HCL 100 MG PO TABS: 100.0000 mg | ORAL_TABLET | Freq: Two times a day (BID) | ORAL | 3 refills | Status: AC

## 2019-05-18 ENCOUNTER — Other Ambulatory Visit: Payer: Self-pay | Admitting: Family Medicine

## 2019-05-18 DIAGNOSIS — I10 Essential (primary) hypertension: Secondary | ICD-10-CM

## 2019-07-05 ENCOUNTER — Telehealth: Payer: Self-pay | Admitting: Family Medicine

## 2019-07-08 ENCOUNTER — Encounter: Payer: Self-pay | Admitting: Family Medicine

## 2019-07-08 ENCOUNTER — Ambulatory Visit (INDEPENDENT_AMBULATORY_CARE_PROVIDER_SITE_OTHER): Payer: PPO | Admitting: Family Medicine

## 2019-07-08 ENCOUNTER — Ambulatory Visit
Admission: RE | Admit: 2019-07-08 | Discharge: 2019-07-08 | Disposition: A | Discharge status: Home / Self Care | Payer: PPO | Attending: Family Medicine | Admitting: Family Medicine

## 2019-07-08 ENCOUNTER — Ambulatory Visit
Admission: RE | Admit: 2019-07-08 | Discharge: 2019-07-08 | Disposition: A | Discharge status: Home / Self Care | Payer: PPO | Source: Ambulatory Visit | Attending: Family Medicine | Admitting: Family Medicine

## 2019-07-08 ENCOUNTER — Other Ambulatory Visit: Payer: Self-pay

## 2019-07-08 VITALS — HR 88 | Temp 97.3°F

## 2019-07-08 DIAGNOSIS — R0600 Dyspnea, unspecified: Secondary | ICD-10-CM

## 2019-07-08 DIAGNOSIS — M1711 Unilateral primary osteoarthritis, right knee: Secondary | ICD-10-CM

## 2019-07-08 DIAGNOSIS — R0602 Shortness of breath: Secondary | ICD-10-CM | POA: Diagnosis not present

## 2019-07-08 DIAGNOSIS — R0609 Other forms of dyspnea: Secondary | ICD-10-CM

## 2019-07-08 DIAGNOSIS — I517 Cardiomegaly: Secondary | ICD-10-CM | POA: Diagnosis not present

## 2019-07-08 IMAGING — DX DG CHEST 2V
3 series · 3 of 3 positions shown · non-contrast
Comparison: None.

CLINICAL DATA: Dyspnea on exertion.

EXAM:
CHEST - 2 VIEW

[chest pa (1 of 2)]
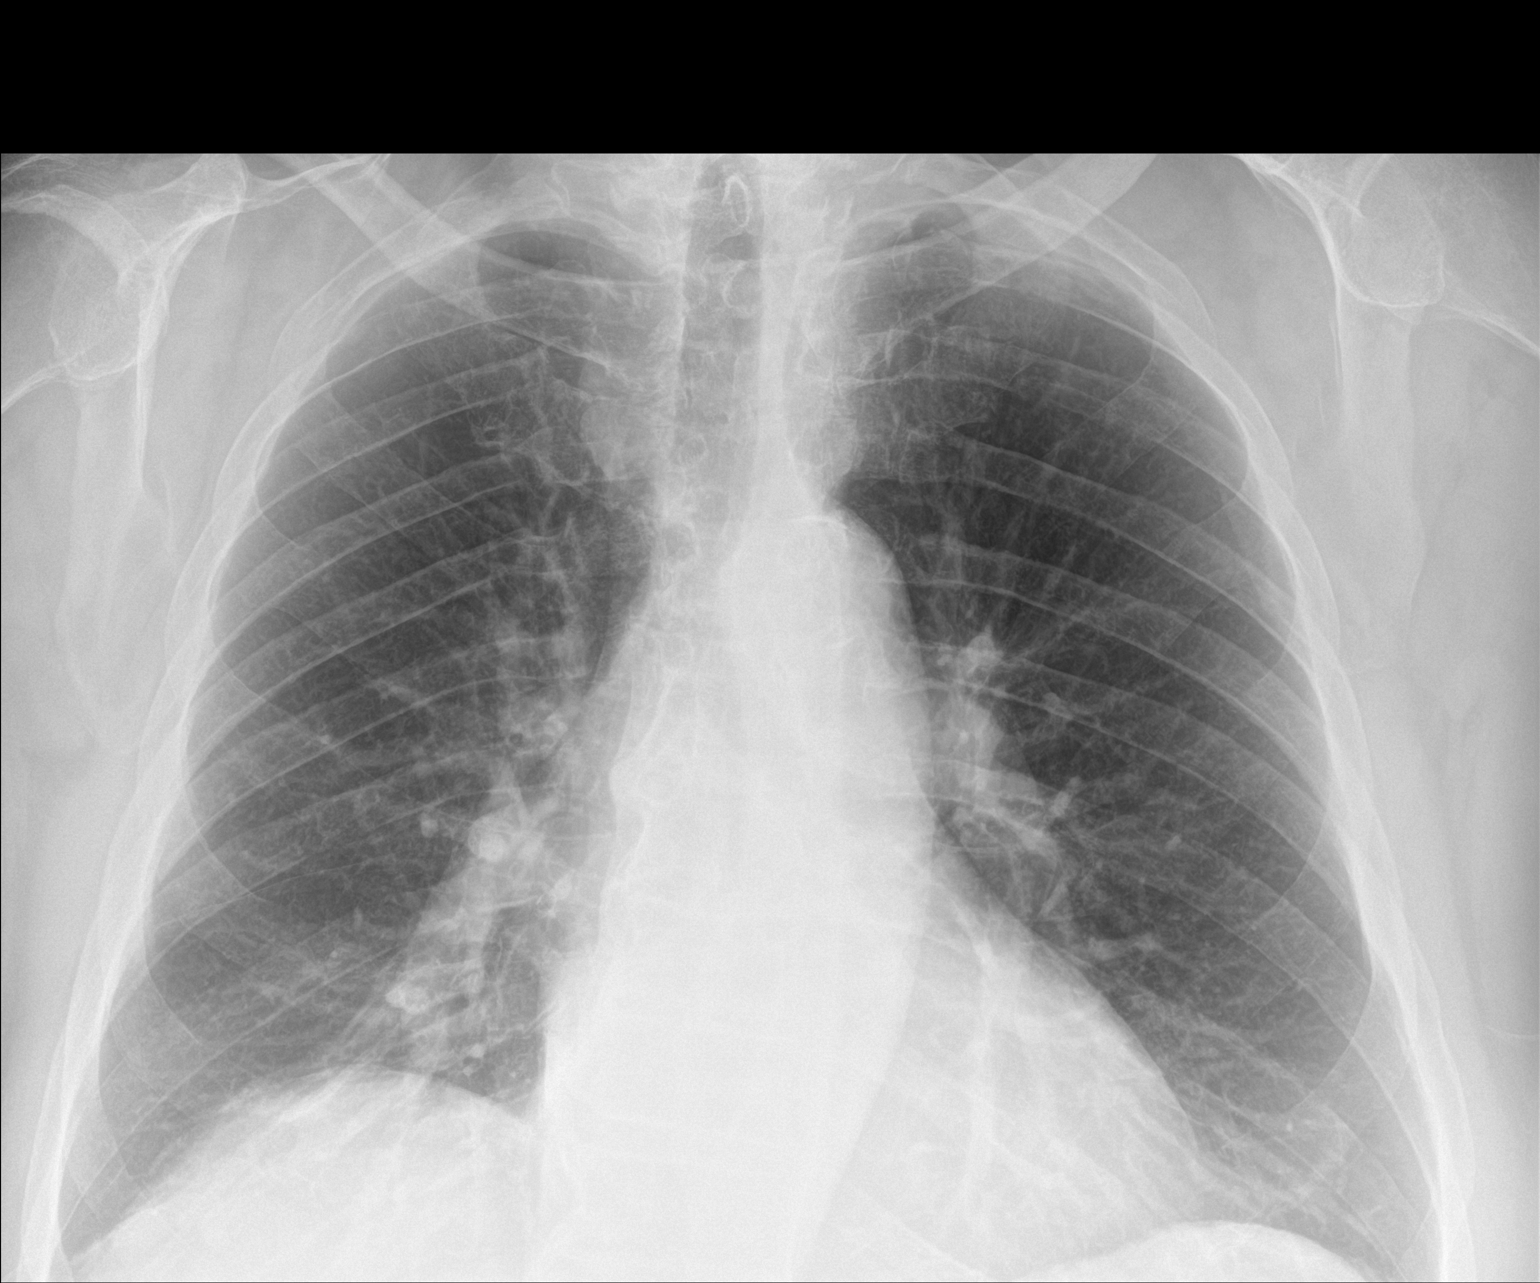

[chest lat]
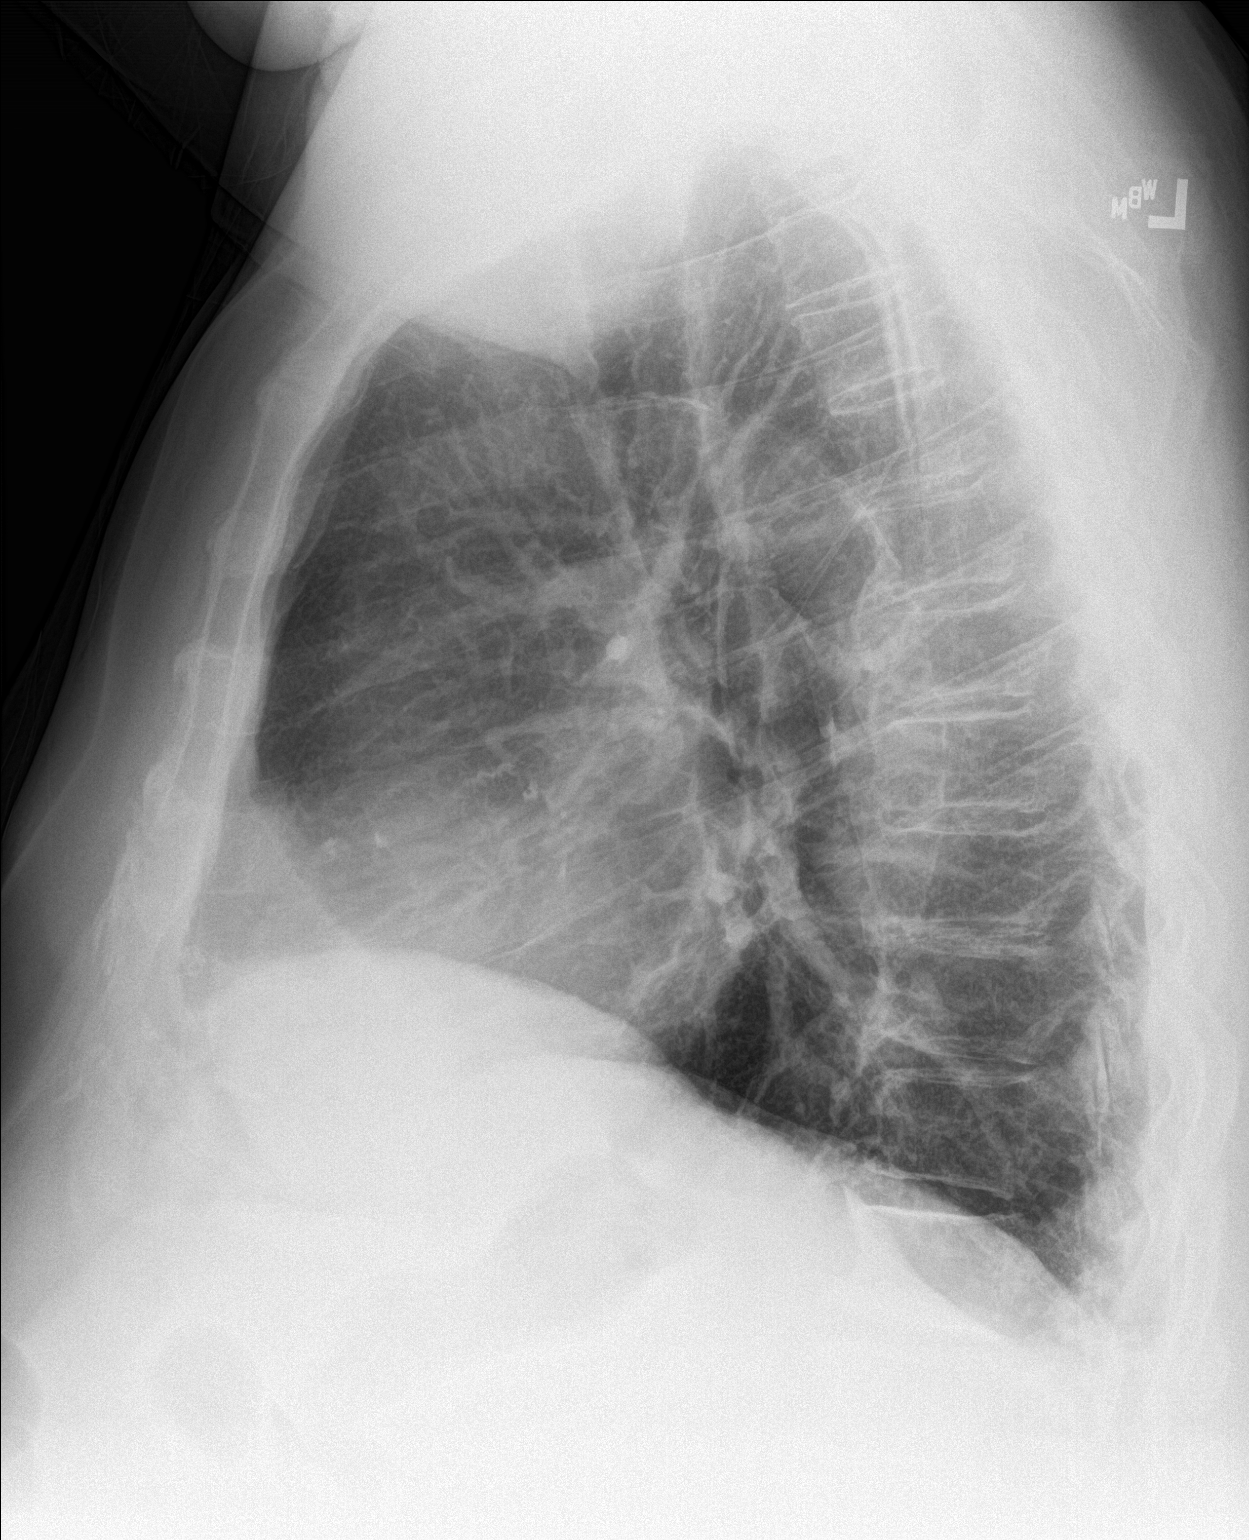

[chest pa (2 of 2)]
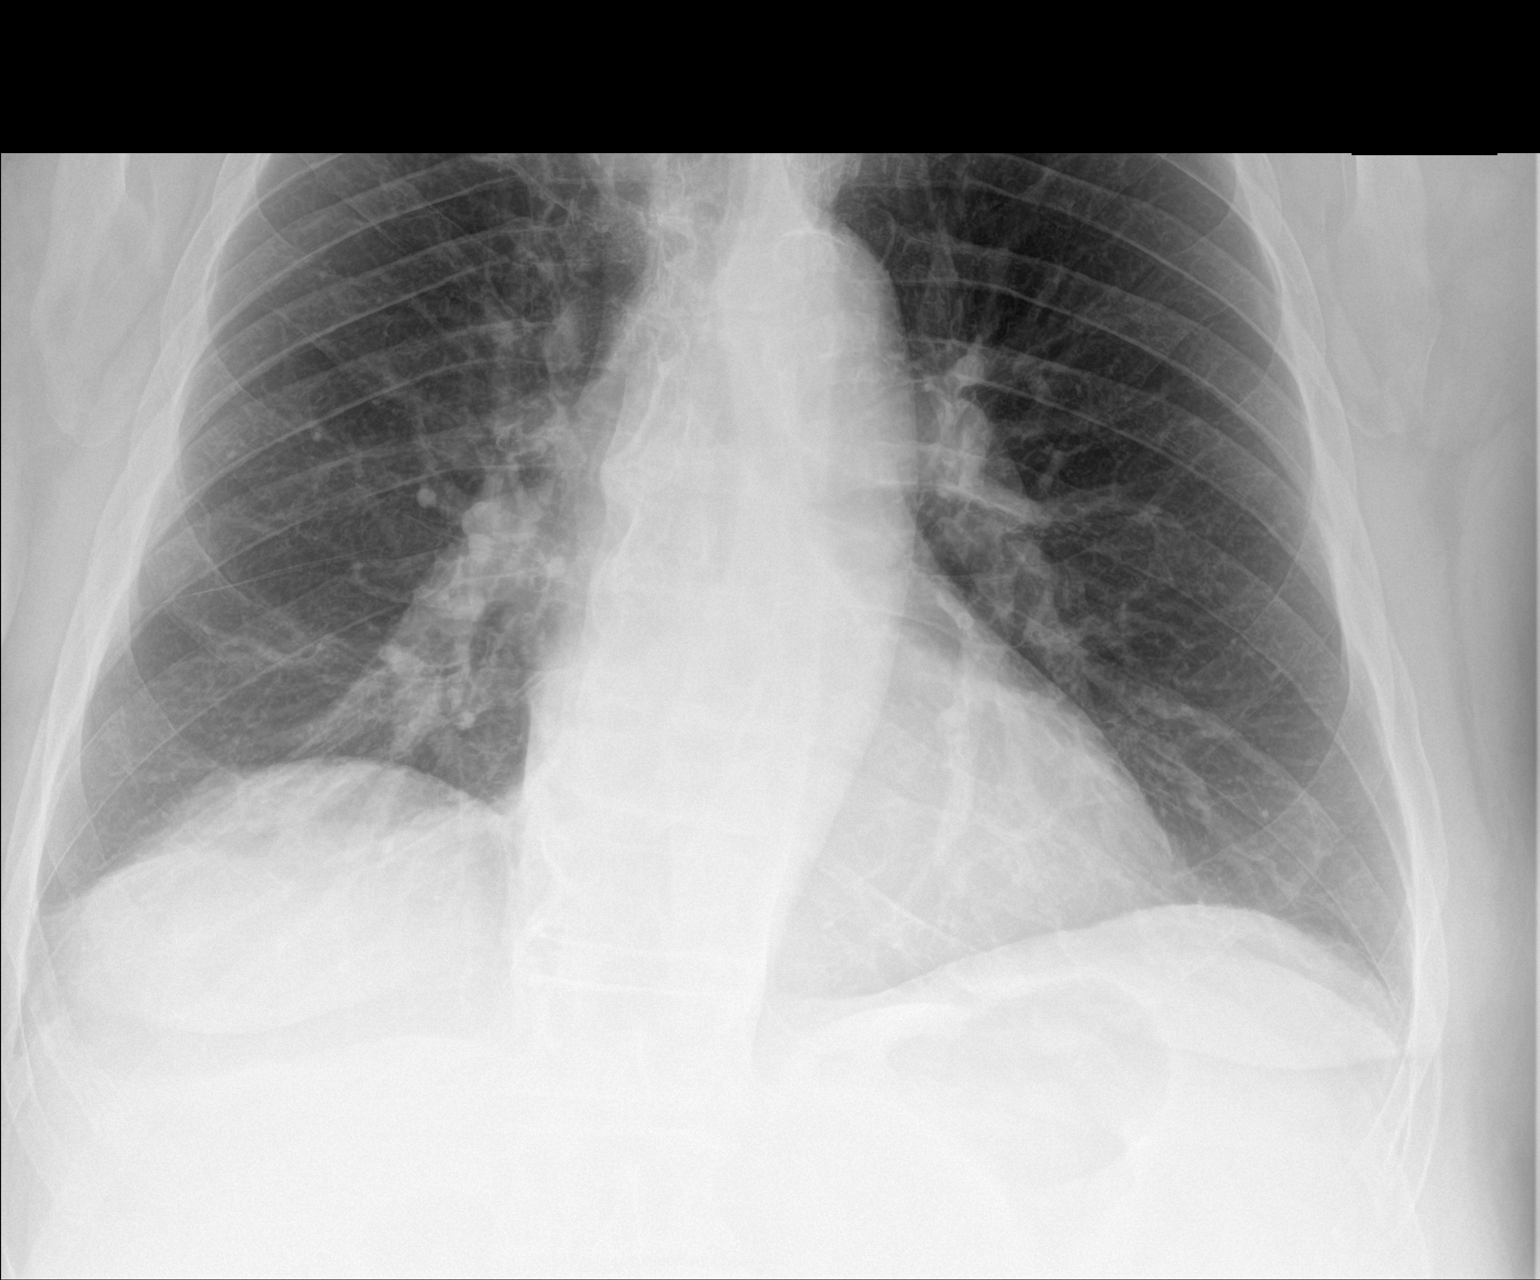

[3 of 3 positions shown; findings below may reference images not displayed]

FINDINGS: The heart size is mildly enlarged. There is scarring versus
atelectasis at the lung bases. There is no pneumothorax. No large
pleural effusion. The lungs appear to be slightly hyperexpanded.
Degenerative changes are noted of the thoracic spine.
IMPRESSION: 1. Mild cardiomegaly.
2. Scarring versus atelectasis at the lung bases.

## 2019-07-08 NOTE — Telephone Encounter (Signed)
Copied from [place] [phone]. Topic: Appointment Scheduling - Scheduling Inquiry for Clinic >> Jul 05, 2019 12:28 PM Alease Frame wrote: Reason for CRM: Pt is requesting blood work for Monday . Please advise   The pt notified that he has an appt scheduled for Monday afternoon. I informed him that Dr. Raliegh Ip can decide then what bloodwork is needed.

## 2019-07-08 NOTE — Progress Notes (Signed)
Subjective:    Patient ID: Robert Daniel, male    DOB: 1940/06/12, 79 y.o.   MRN: [id number]  [name] is a 79 y.o. male presenting on 07/08/2019 for Shortness of Breath (handicap sticker ) and Back Pain   HPI   Dyspnea on Exertion Hypertension / White Coat Syndrome  Followed by [facility] He has long history of difficult to control or difficult to measure BP. Today he refuses BP check. He has some home readings still concern with systolic BP He is admitting gradual worsening dyspnea or shortness of breath on exertion. He has decline in functional status it seems, not as able to tolerate as much activity as before, will get winded and short of breath. Last ECHO 03/2017. Unremarkable. He has no primary lung problem that we are aware of. He is asking about handicap placard to help lessen walk to stores, he cannot walk >248ft without stopping to rest, he has known osteoarthritis limiting his walking in his R knee, he uses a rolling walker for assistance, and he is considering a power mobility device out of pocket, as opposed to getting it approved. Also thinking about going back to [place] for any additional management Denies any chest pain, dyspnea at rest, cough, wheezing, dizziness near syncope lightheadedness   Depression screen [facility] 2/9 07/08/2019 02/19/2019 09/04/2018  Decreased Interest 0 0 0  Down, Depressed, Hopeless 0 0 0  PHQ - 2 Score 0 0 0  Altered sleeping - - -  Tired, decreased energy - - -  Change in appetite - - -  Feeling bad or failure about yourself  - - -  Trouble concentrating - - -  Moving slowly or fidgety/restless - - -  Suicidal thoughts - - -  PHQ-9 Score - - -  Difficult doing work/chores - - -    Social History   Tobacco Use  . Smoking status: Never Smoker  . Smokeless tobacco: Never Used  Substance Use Topics  . Alcohol use: No    Comment: wine occasionaly   . Drug use: No    Review of Systems Per HPI unless specifically indicated  above     Objective:    Pulse 88   Temp (!) 97.3 F (36.3 C) (Temporal)   SpO2 98%   Wt Readings from Last 3 Encounters:  04/02/19 278 lb (126.1 kg)  02/19/19 279 lb (126.6 kg)  09/04/18 278 lb (126.1 kg)    Physical Exam Vitals and nursing note reviewed.  Constitutional:      General: He is not in acute distress.    Appearance: He is well-developed. He is obese. He is not diaphoretic.     Comments: Well-appearing, comfortable, cooperative  HENT:     Head: Normocephalic and atraumatic.  Eyes:     General:        Right eye: No discharge.        Left eye: No discharge.     Conjunctiva/sclera: Conjunctivae normal.  Neck:     Thyroid: No thyromegaly.  Cardiovascular:     Rate and Rhythm: Normal rate and regular rhythm.     Heart sounds: Normal heart sounds. No murmur.  Pulmonary:     Effort: Pulmonary effort is normal. No respiratory distress.     Breath sounds: Normal breath sounds. No decreased breath sounds, wheezing, rhonchi or rales.  Musculoskeletal:        General: Normal range of motion.     Cervical back: Normal range of motion and neck  supple.     Comments: Has rolling walker  Lymphadenopathy:     Cervical: No cervical adenopathy.  Skin:    General: Skin is warm and dry.     Findings: No erythema or rash.  Neurological:     Mental Status: He is alert and oriented to person, place, and time.  Psychiatric:        Behavior: Behavior normal.     Comments: Well groomed, good eye contact, normal speech and thoughts      I have personally reviewed the radiology report from 07/08/19 on Chest X-ray.  CLINICAL DATA:  Dyspnea on exertion.  EXAM: CHEST - 2 VIEW  COMPARISON:  None.  FINDINGS: The heart size is mildly enlarged. There is scarring versus atelectasis at the lung bases. There is no pneumothorax. No large pleural effusion. The lungs appear to be slightly hyperexpanded. Degenerative changes are noted of the thoracic spine.  IMPRESSION: 1. Mild  cardiomegaly. 2. Scarring versus atelectasis at the lung bases.   Electronically Signed   By: Constance Holster M.D.   On: 07/08/2019 15:35 -------------------------------------------------------------   I have personally reviewed the radiology report from 05/12/17 R knee X-ray  CLINICAL DATA:  Acute pain of right knee,, bilateral and posterior, w/swelling, no injury  EXAM: RIGHT KNEE - COMPLETE 4+ VIEW  COMPARISON:  None.  FINDINGS: No fracture.  No bone lesion.  The femorotibial joint space compartments are well preserved.  There is narrowing of the patellofemoral joint space compartment with mild irregular sclerosis along the dorsal aspect of the patella and marginal osteophytes from the superior and inferior patella.  No convincing joint effusion.  There are scattered vascular calcifications posteriorly.  IMPRESSION: 1. No fracture or acute finding. 2. Arthropathic/degenerative changes of the patellofemoral joint space compartment.   Electronically Signed   By: Lajean Manes M.D.   On: 05/12/2017 14:29  Results for orders placed or performed in visit on 01/28/19  Novel Coronavirus, NAA ([facility])   Specimen: Oropharyngeal(OP) collection in vial transport medium   OROPHARYNGEA  TESTING  Result Value Ref Range   SARS-CoV-2, NAA Not Detected Not Detected      Assessment & Plan:   Problem List Items Addressed This Visit    Shortness of breath   Relevant Orders   DG Chest 2 View (Completed)   Osteoarthritis of right knee    Other Visit Diagnoses    Dyspnea on exertion    -  Primary      #Handicap placard He has limited functional status with osteoarthritis, among other health issues uncontrolled HTN, dyspnea, and deconditioning Cannot walk 200 ft w/o stop to rest Uses device for assisted ambulation Arthritic condition Completed handicap placard form today, total permanent disability up to 5 years, signed returned to patient  #Dyspnea on  Exertion Likely multifactorial, with known uncontrolled HTN could possibly be cardiac related, otherwise no acute pulmonary etiology identified May be component of deconditioning, obesity as well Check STAT CXR today - reviewed results above and called patient, may cons -  Mild or small amount of atelectasis at base of lungs, otherwise mild enlarged heart and no other acute cause of your shortness of breath. work on deeper breathing and follow up with Dr Rockey Situ as planned and if need we can refer to Pulmonology in future.   No orders of the defined types were placed in this encounter.     Follow up plan: Return if symptoms worsen or fail to improve, for dyspnea / handicap placard.  Nobie Putnam, [facility] 07/08/2019, 2:58 PM

## 2019-07-08 NOTE — Patient Instructions (Addendum)
Thank you for coming to the office today.  Handicap placard should be good for up to 5 years.  X-ray today, will send results to mychart.  Follow with Dr Rockey Situ as well  If issues with lungs we can refer to a Pulmonology specialist if needed   Please schedule a Follow-up Appointment to: Return if symptoms worsen or fail to improve, for dyspnea / handicap placard.  If you have any other questions or concerns, please feel free to call the office or send a message through [place]. You may also schedule an earlier appointment if necessary.  Additionally, you may be receiving a survey about your experience at our office within a few days to 1 week by e-mail or mail. We value your feedback.  Nobie Putnam, DO [place]

## 2019-07-12 ENCOUNTER — Encounter: Payer: Self-pay | Admitting: Family Medicine

## 2019-07-12 DIAGNOSIS — E113393 Type 2 diabetes mellitus with moderate nonproliferative diabetic retinopathy without macular edema, bilateral: Secondary | ICD-10-CM | POA: Diagnosis not present

## 2019-07-12 LAB — HM DIABETES EYE EXAM

## 2019-07-15 ENCOUNTER — Encounter: Payer: Self-pay | Admitting: Family Medicine

## 2019-07-15 ENCOUNTER — Telehealth: Payer: Self-pay | Admitting: Family Medicine

## 2019-07-15 DIAGNOSIS — E785 Hyperlipidemia, unspecified: Secondary | ICD-10-CM

## 2019-07-15 DIAGNOSIS — I10 Essential (primary) hypertension: Secondary | ICD-10-CM

## 2019-07-15 DIAGNOSIS — R351 Nocturia: Secondary | ICD-10-CM

## 2019-07-15 DIAGNOSIS — E1121 Type 2 diabetes mellitus with diabetic nephropathy: Secondary | ICD-10-CM

## 2019-07-15 DIAGNOSIS — Z0000 Encounter for general adult medical examination without abnormal findings: Secondary | ICD-10-CM

## 2019-07-15 DIAGNOSIS — E1169 Type 2 diabetes mellitus with other specified complication: Secondary | ICD-10-CM

## 2019-07-15 NOTE — Telephone Encounter (Signed)
Lab orders signed  Nobie Putnam, [facility] 07/15/2019, 12:26 PM

## 2019-07-16 ENCOUNTER — Other Ambulatory Visit: Payer: Self-pay

## 2019-07-16 ENCOUNTER — Other Ambulatory Visit: Payer: PPO

## 2019-07-16 DIAGNOSIS — E1121 Type 2 diabetes mellitus with diabetic nephropathy: Secondary | ICD-10-CM

## 2019-07-16 DIAGNOSIS — R351 Nocturia: Secondary | ICD-10-CM | POA: Diagnosis not present

## 2019-07-16 DIAGNOSIS — E1169 Type 2 diabetes mellitus with other specified complication: Secondary | ICD-10-CM | POA: Diagnosis not present

## 2019-07-16 DIAGNOSIS — E785 Hyperlipidemia, unspecified: Secondary | ICD-10-CM | POA: Diagnosis not present

## 2019-07-16 DIAGNOSIS — Z0000 Encounter for general adult medical examination without abnormal findings: Secondary | ICD-10-CM | POA: Diagnosis not present

## 2019-07-16 DIAGNOSIS — I10 Essential (primary) hypertension: Secondary | ICD-10-CM | POA: Diagnosis not present

## 2019-07-17 LAB — LIPID PANEL
Cholesterol: 190 mg/dL (ref <200)
HDL: 32 mg/dL — ABNORMAL LOW (ref >=40)
LDL Cholesterol (Calc): 121 mg/dL (calc) — ABNORMAL HIGH
Non-HDL Cholesterol (Calc): 158 mg/dL (calc) — ABNORMAL HIGH (ref <130)
Total CHOL/HDL Ratio: 5.9 (calc) — ABNORMAL HIGH (ref <5.0)
Triglycerides: 241 mg/dL — ABNORMAL HIGH (ref <150)

## 2019-07-17 LAB — COMPLETE METABOLIC PANEL WITH GFR
AG Ratio: 2 (calc) (ref 1.0–2.5)
ALT: 18 U/L (ref 9–46)
AST: 18 U/L (ref 10–35)
Albumin: 4.8 g/dL (ref 3.6–5.1)
Alkaline phosphatase (APISO): 36 U/L (ref 35–144)
BUN/Creatinine Ratio: 20 (calc) (ref 6–22)
BUN: 27 mg/dL — ABNORMAL HIGH (ref 7–25)
CO2: 24 mmol/L (ref 20–32)
Calcium: 9.8 mg/dL (ref 8.6–10.3)
Chloride: 105 mmol/L (ref 98–110)
Creat: 1.35 mg/dL — ABNORMAL HIGH (ref 0.70–1.11)
GFR, Est African American: 57 mL/min/{1.73_m2} — ABNORMAL LOW (ref >=60)
GFR, Est Non African American: 49 mL/min/{1.73_m2} — ABNORMAL LOW (ref >=60)
Globulin: 2.4 g/dL (calc) (ref 1.9–3.7)
Glucose, Bld: 153 mg/dL — ABNORMAL HIGH (ref 65–99)
Potassium: 4.7 mmol/L (ref 3.5–5.3)
Sodium: 142 mmol/L (ref 135–146)
Total Bilirubin: 0.4 mg/dL (ref 0.2–1.2)
Total Protein: 7.2 g/dL (ref 6.1–8.1)

## 2019-07-17 LAB — CBC WITH DIFFERENTIAL/PLATELET
Absolute Monocytes: 476 cells/uL (ref 200–950)
Basophils Absolute: 29 cells/uL (ref 0–200)
Basophils Relative: 0.5 %
Eosinophils Absolute: 151 cells/uL (ref 15–500)
Eosinophils Relative: 2.6 %
HCT: 38 % — ABNORMAL LOW (ref 38.5–50.0)
Hemoglobin: 12.6 g/dL — ABNORMAL LOW (ref 13.2–17.1)
Lymphs Abs: 1514 cells/uL (ref 850–3900)
MCH: 29 pg (ref 27.0–33.0)
MCHC: 33.2 g/dL (ref 32.0–36.0)
MCV: 87.6 fL (ref 80.0–100.0)
MPV: 13 fL — ABNORMAL HIGH (ref 7.5–12.5)
Monocytes Relative: 8.2 %
Neutro Abs: 3631 cells/uL (ref 1500–7800)
Neutrophils Relative %: 62.6 %
Platelets: 173 10*3/uL (ref 140–400)
RBC: 4.34 10*6/uL (ref 4.20–5.80)
RDW: 13.7 % (ref 11.0–15.0)
Total Lymphocyte: 26.1 %
WBC: 5.8 10*3/uL (ref 3.8–10.8)

## 2019-07-17 LAB — HEMOGLOBIN A1C
Hgb A1c MFr Bld: 8.3 % of total Hgb — ABNORMAL HIGH (ref <5.7)
Mean Plasma Glucose: 192 (calc)
eAG (mmol/L): 10.6 (calc)

## 2019-07-17 LAB — PSA: PSA: 3.7 ng/mL (ref <=4.0)

## 2019-07-23 ENCOUNTER — Encounter: Payer: Self-pay | Admitting: Family Medicine

## 2019-07-24 ENCOUNTER — Other Ambulatory Visit: Payer: Self-pay

## 2019-07-24 ENCOUNTER — Ambulatory Visit (INDEPENDENT_AMBULATORY_CARE_PROVIDER_SITE_OTHER): Payer: PPO | Admitting: Family Medicine

## 2019-07-24 ENCOUNTER — Encounter: Payer: Self-pay | Admitting: Family Medicine

## 2019-07-24 VITALS — BP 183/88 | HR 78 | Temp 97.1°F | Resp 16 | Ht 75.0 in | Wt 291.6 lb

## 2019-07-24 DIAGNOSIS — E1169 Type 2 diabetes mellitus with other specified complication: Secondary | ICD-10-CM

## 2019-07-24 DIAGNOSIS — E1121 Type 2 diabetes mellitus with diabetic nephropathy: Secondary | ICD-10-CM | POA: Diagnosis not present

## 2019-07-24 DIAGNOSIS — E785 Hyperlipidemia, unspecified: Secondary | ICD-10-CM | POA: Diagnosis not present

## 2019-07-24 DIAGNOSIS — F5101 Primary insomnia: Secondary | ICD-10-CM | POA: Diagnosis not present

## 2019-07-24 DIAGNOSIS — I10 Essential (primary) hypertension: Secondary | ICD-10-CM | POA: Diagnosis not present

## 2019-07-24 DIAGNOSIS — Z0000 Encounter for general adult medical examination without abnormal findings: Secondary | ICD-10-CM

## 2019-07-24 MED ORDER — ZALEPLON 5 MG PO CAPS: 5.0000 mg | ORAL_CAPSULE | Freq: Every evening | ORAL | 2 refills | Status: DC | PRN

## 2019-07-24 NOTE — Progress Notes (Signed)
Subjective:    Patient ID: Robert Daniel, male    DOB: 08-03-1940, 79 y.o.   MRN: [id number]  Robert Daniel is a 79 y.o. male presenting on 07/24/2019 for Annual Exam   HPI   Here for Annual Physical and Lab Review.  RESISTANT HTN Home BP readingsstillvariable systolic BP had been improved recently Followed by Cardiology Current Meds -Felodipine XL 10mg  daily, Benazepril 40mg  daily, Hydralazine 100mg  QID, Clonidine 0.2mg  QID, Imdur 60mg  BID - Tosemide 100mg  PRN with K - improves edema with this PRN takes occasionally about 1 x weekly or so, he wears compression as well with relief Reports good compliance, took meds today. Tolerating well, w/o complaints. Lifestyle: Weightgain again by our scale Denies chest pain or tightness actively or recently  CHRONIC DM, Type 2with Nephropathy CKD-III / Obesity BMI >36 Improved A1c down to 8.3 from 9.6 Weight gain in interval He has declined weekly injection GLP1 in past. Meds:Glimepiride4mg daily, Metformin 1000mg  BID Reports good compliance. Tolerating well w/o side-effects Currently on ACEi - No history of neuropathy or numbness tingling Diet - tries to do low carb diet, not always, admits some sweets - [facility] have record Admits rare hypoglycemia - now seems resolved Deniespolyuria, visual changes, numbness or tingling.  Primary insomnia Chronic problem. In past was on sleep medicine PRN with Ambien. Interested to restart a low dose medicine not every night.  Supplements - Cinnamon and now added Chromium, had significant episode of hypoglycemia. He started very high dose, he wants to try reduced dose.  UTD COVID19 vaccine.  Depression screen [facility] 2/9 07/08/2019 02/19/2019 09/04/2018  Decreased Interest 0 0 0  Down, Depressed, Hopeless 0 0 0  PHQ - 2 Score 0 0 0  Altered sleeping - - -  Tired, decreased energy - - -  Change in appetite - - -  Feeling bad or failure about  yourself  - - -  Trouble concentrating - - -  Moving slowly or fidgety/restless - - -  Suicidal thoughts - - -  PHQ-9 Score - - -  Difficult doing work/chores - - -    Past Medical History:  Diagnosis Date  . Hyperlipidemia 02/16/2017  . Hypertension    Past Surgical History:  Procedure Laterality Date  . CARDIAC CATHETERIZATION  2013  . CATARACT EXTRACTION Bilateral 04/21/2008   R eye 05-12-08   Social History   Socioeconomic History  . Marital status: Divorced    Spouse name: Not on file  . Number of children: Not on file  . Years of education: Graduate  . Highest education level: Not on file  Occupational History  . Not on file  Tobacco Use  . Smoking status: Never Smoker  . Smokeless tobacco: Never Used  Substance and Sexual Activity  . Alcohol use: No    Comment: wine occasionaly   . Drug use: No  . Sexual activity: Not on file  Other Topics Concern  . Not on file  Social History Narrative   Works part time Multimedia programmer in fall and spring      [employer]    Social Determinants of Health   Financial Resource Strain:   . Difficulty of Paying Living Expenses:   Food Insecurity:   . Worried About Charity fundraiser in the Last Year:   . Arboriculturist in the Last Year:   Transportation Needs:   . Film/video editor (Medical):   Marland Kitchen Lack  of Transportation (Non-Medical):   Physical Activity:   . Days of Exercise per Week:   . Minutes of Exercise per Session:   Stress:   . Feeling of Stress :   Social Connections:   . Frequency of Communication with Friends and Family:   . Frequency of Social Gatherings with Friends and Family:   . Attends Religious Services:   . Active Member of Clubs or Organizations:   . Attends Archivist Meetings:   Marland Kitchen Marital Status:   Intimate Partner Violence:   . Fear of Current or Ex-Partner:   . Emotionally Abused:   Marland Kitchen Physically Abused:   . Sexually Abused:    No family history on file. Current  Outpatient Medications on File Prior to Visit  Medication Sig  . acetaminophen (TYLENOL) 500 MG tablet Take 500 mg by mouth every 6 (six) hours as needed.  . Ascorbic Acid (VITAMIN C) 1000 MG tablet Take 1,000 mg by mouth daily.  Marland Kitchen aspirin EC 81 MG tablet Take 81 mg by mouth daily.  . benazepril (LOTENSIN) 40 MG tablet Take 1 tablet (40 mg total) by mouth daily.  . cloNIDine (CATAPRES) 0.2 MG tablet Take 1 tablet by mouth 4 times daily  . co-enzyme Q-10 30 MG capsule Take 30 mg by mouth 3 (three) times daily.  . felodipine (PLENDIL) 10 MG 24 hr tablet Take 1 tablet (10 mg total) by mouth daily.  Marland Kitchen gemfibrozil (LOPID) 600 MG tablet TAKE 1 TABLET BY MOUTH TWICE DAILY BEFORE A MEAL  . glimepiride (AMARYL) 4 MG tablet Take 1 tablet by mouth once daily with breakfast  . hydrALAZINE (APRESOLINE) 100 MG tablet Take 1 tablet (100 mg total) by mouth in the morning, at noon, in the evening, and at bedtime.  Marland Kitchen ibuprofen (ADVIL,MOTRIN) 200 MG tablet Take 800 mg by mouth every 6 (six) hours as needed.  . isosorbide mononitrate (IMDUR) 60 MG 24 hr tablet Take 1 tablet (60 mg total) by mouth 2 (two) times daily.  Marland Kitchen labetalol (NORMODYNE) 100 MG tablet Take 1 tablet (100 mg total) by mouth 2 (two) times daily. Take 1 tablet (100 mg total) by mouth 2 (two) times daily. May take an additional half tablet as needed for systolic blood pressure >696.  Marland Kitchen lovastatin (MEVACOR) 20 MG tablet TAKE 1 TABLET BY MOUTH AT BEDTIME  . metFORMIN (GLUCOPHAGE) 1000 MG tablet TAKE 1 TABLET BY MOUTH TWICE DAILY WITH A MEAL  . Multiple Vitamin (MULTIVITAMIN) tablet Take 1 tablet by mouth daily.  . nitroGLYCERIN (NITROSTAT) 0.4 MG SL tablet Place 1 tablet (0.4 mg total) under the tongue every 5 (five) minutes as needed for chest pain.  . Omega-3 Fatty Acids (OMEGA-3 FISH OIL) 1200 MG CAPS Take 2,000 mg by mouth 2 (two) times daily with a meal.  . Turmeric 500 MG TABS Take by mouth daily.    No current facility-administered medications  on file prior to visit.    Review of Systems  Constitutional: Negative for activity change, appetite change, chills, diaphoresis, fatigue and fever.  HENT: Negative for congestion and hearing loss.   Eyes: Negative for visual disturbance.  Respiratory: Negative for cough, chest tightness, shortness of breath and wheezing.   Cardiovascular: Negative for chest pain, palpitations and leg swelling.  Gastrointestinal: Negative for abdominal pain, constipation, diarrhea, nausea and vomiting.  Endocrine: Negative for cold intolerance.  Genitourinary: Negative for dysuria, frequency and hematuria.  Musculoskeletal: Negative for arthralgias and neck pain.  Skin: Negative for rash.  Allergic/Immunologic:  Negative for environmental allergies.  Neurological: Negative for dizziness, weakness, light-headedness, numbness and headaches.  Hematological: Negative for adenopathy.  Psychiatric/Behavioral: Negative for behavioral problems, dysphoric mood and sleep disturbance.   Per HPI unless specifically indicated above      Objective:    BP (!) 183/88   Pulse 78   Temp (!) 97.1 F (36.2 C) (Temporal)   Resp 16   Ht 6\' 3"  (1.905 m)   Wt 291 lb 9.6 oz (132.3 kg)   SpO2 98%   BMI 36.45 kg/m   Wt Readings from Last 3 Encounters:  07/24/19 291 lb 9.6 oz (132.3 kg)  04/02/19 278 lb (126.1 kg)  02/19/19 279 lb (126.6 kg)    Physical Exam Vitals and nursing note reviewed.  Constitutional:      General: He is not in acute distress.    Appearance: He is well-developed. He is obese. He is not diaphoretic.     Comments: Well-appearing, comfortable, cooperative  HENT:     Head: Normocephalic and atraumatic.  Eyes:     General:        Right eye: No discharge.        Left eye: No discharge.     Conjunctiva/sclera: Conjunctivae normal.     Pupils: Pupils are equal, round, and reactive to light.  Neck:     Thyroid: No thyromegaly.  Cardiovascular:     Rate and Rhythm: Normal rate and regular  rhythm.     Heart sounds: Normal heart sounds. No murmur.  Pulmonary:     Effort: Pulmonary effort is normal. No respiratory distress.     Breath sounds: Normal breath sounds. No wheezing or rales.  Abdominal:     General: Bowel sounds are normal. There is no distension.     Palpations: Abdomen is soft. There is no mass.     Tenderness: There is no abdominal tenderness.  Musculoskeletal:        General: No tenderness. Normal range of motion.     Cervical back: Normal range of motion and neck supple.     Comments: Upper / Lower Extremities: - Normal muscle tone, strength bilateral upper extremities 5/5, lower extremities 5/5  Lymphadenopathy:     Cervical: No cervical adenopathy.  Skin:    General: Skin is warm and dry.     Findings: No erythema or rash.  Neurological:     Mental Status: He is alert and oriented to person, place, and time.     Comments: Distal sensation intact to light touch all extremities  Psychiatric:        Behavior: Behavior normal.     Comments: Well groomed, good eye contact, normal speech and thoughts      Diabetic Foot Exam - Simple   Simple Foot Form Diabetic Foot exam was performed with the following findings: Yes 07/24/2019 11:30 AM  Visual Inspection See comments: Yes Sensation Testing See comments: Yes Pulse Check Posterior Tibialis and Dorsalis pulse intact bilaterally: Yes Comments Dry skin dermatitis, callus formation, no ulceration. Reduced monofilament testing bilateral different areas forefoot and toes medial worse.      Results for orders placed or performed in visit on 07/16/19  PSA  Result Value Ref Range   PSA 3.7 < OR = 4.0 ng/mL  Hemoglobin A1c  Result Value Ref Range   Hgb A1c MFr Bld 8.3 (H) <5.7 % of total Hgb   Mean Plasma Glucose 192 (calc)   eAG (mmol/L) 10.6 (calc)  Lipid panel  Result Value  Ref Range   Cholesterol 190 <200 mg/dL   HDL 32 (L) > OR = 40 mg/dL   Triglycerides 241 (H) <150 mg/dL   LDL Cholesterol  (Calc) 121 (H) mg/dL (calc)   Total CHOL/HDL Ratio 5.9 (H) <5.0 (calc)   Non-HDL Cholesterol (Calc) 158 (H) <130 mg/dL (calc)  CBC with Differential/Platelet  Result Value Ref Range   WBC 5.8 3.8 - 10.8 Thousand/uL   RBC 4.34 4.20 - 5.80 Million/uL   Hemoglobin 12.6 (L) 13.2 - 17.1 g/dL   HCT 38.0 (L) 38.5 - 50.0 %   MCV 87.6 80.0 - 100.0 fL   MCH 29.0 27.0 - 33.0 pg   MCHC 33.2 32.0 - 36.0 g/dL   RDW 13.7 11.0 - 15.0 %   Platelets 173 140 - 400 Thousand/uL   MPV 13.0 (H) 7.5 - 12.5 fL   Neutro Abs 3,631 1,500 - 7,800 cells/uL   Lymphs Abs 1,514 850 - 3,900 cells/uL   Absolute Monocytes 476 200 - 950 cells/uL   Eosinophils Absolute 151 15 - 500 cells/uL   Basophils Absolute 29 0 - 200 cells/uL   Neutrophils Relative % 62.6 %   Total Lymphocyte 26.1 %   Monocytes Relative 8.2 %   Eosinophils Relative 2.6 %   Basophils Relative 0.5 %  COMPLETE METABOLIC PANEL WITH GFR  Result Value Ref Range   Glucose, Bld 153 (H) 65 - 99 mg/dL   BUN 27 (H) 7 - 25 mg/dL   Creat 1.35 (H) 0.70 - 1.11 mg/dL   GFR, Est Non African American 49 (L) > OR = 60 mL/min/1.58m2   GFR, Est African American 57 (L) > OR = 60 mL/min/1.45m2   BUN/Creatinine Ratio 20 6 - 22 (calc)   Sodium 142 135 - 146 mmol/L   Potassium 4.7 3.5 - 5.3 mmol/L   Chloride 105 98 - 110 mmol/L   CO2 24 20 - 32 mmol/L   Calcium 9.8 8.6 - 10.3 mg/dL   Total Protein 7.2 6.1 - 8.1 g/dL   Albumin 4.8 3.6 - 5.1 g/dL   Globulin 2.4 1.9 - 3.7 g/dL (calc)   AG Ratio 2.0 1.0 - 2.5 (calc)   Total Bilirubin 0.4 0.2 - 1.2 mg/dL   Alkaline phosphatase (APISO) 36 35 - 144 U/L   AST 18 10 - 35 U/L   ALT 18 9 - 46 U/L      Assessment & Plan:   Problem List Items Addressed This Visit    White coat syndrome with hypertension   Type 2 diabetes with nephropathy (HCC)    Improving O6V to 8.3 Complications - RARE HYPOGLYCEMIA - CKD-III in setting of HTN as well, and other including hyperlipidemia - increases risk of future cardiovascular  complications Off [place]bels[place]?  Plan:  1. Continue Metformin 1000 BID, Glimepiride - declined GLP1 injection 2. Encourage improved lifestyle - low carb, low sugar diet, reduce portion size, continue improving regular exercise 3. Check CBG, bring log to next visit for review 4. Continue ASA, ACEi, Statin 5. UTD DM [facility] [place] 07/2019 DM Foot      Resistant hypertension    Clinically asymptomatic today, resistant HTN Elevated BP but improved. Secondary anxiety white coat HTN - Home avg improved compared to office readings Complicated CKD-III - Prior work-up done by Vascular and Renal with negative secondary HTN work-up, negative RAS. Recent work-up negative Hyperaldosterone labs, TSH - Failed: Bystolic, Metoprolol, HCTZ, Diovan, Amlodipine - Followed by [facility] Cardiology  Plan:  1. Continue current  BP regimen- continue Felodipine XL 10mg  daily, Benazepril 40mg , Clonidine 0.2mg  QID, Hydralazine 100mg  QID - torsemide PRN 2. Encourage improved lifestyle - low sodium diet, regular exercise 3. Continue monitor BP outside office, bring readings to next visit for review 4. Follow-up with Cardiology for further work-up and management as he is  Strict return precautions given to patient when to go to hospital ED if worsening symptoms with BP      Hyperlipidemia associated with type 2 diabetes mellitus ([place])    Uncontrolled cholesterol on statin, and some poor lifestyle Last lipid panel 07/2019 Calculated ASCVD 10 yr risk score elevated risk w/ DM  Plan: 1. Increase Fish Oil omega 3 - to 2000mg  capsules twice a day with meals for triglycerides 2. Continue current meds - Lovastatin 20mg  daily, Gemfibrozil 600mg  BID 3. Continue ASA 81mg  for primary ASCVD risk reduction 4. Encourage improved lifestyle - low carb/cholesterol, reduce portion size, continue improving regular exercise       Other Visit Diagnoses    Annual physical exam    -  Primary   Primary insomnia       Relevant  Medications   zaleplon (SONATA) 5 MG capsule      Updated Health Maintenance information Reviewed recent lab results with patient Encouraged improvement to lifestyle with diet and exercise - Goal of weight loss  #Insomnia Chronic problem Previously on sleep aid rx in past, now has had recent difficulty with insomnia Will agree to re order limited dosing zaleplon 5mg  nightly PRN caution sedation side effect safe dosing  Meds ordered this encounter  Medications  . zaleplon (SONATA) 5 MG capsule    Sig: Take 1 capsule (5 mg total) by mouth at bedtime as needed for sleep.    Dispense:  20 capsule    Refill:  2      Follow up plan: Return in about 6 months (around 01/24/2020) for 6 month follow-up DM A1c HTN, Dyspnea.  Nobie Putnam, [facility] 07/24/2019, 11:17 AM

## 2019-07-24 NOTE — Patient Instructions (Addendum)
Thank you for coming to the office today.  May try low dose Chromium and increase dose as needed, may take this - caution.  Increase Fish Oil omega 3 - to 2000mg  capsules twice a day with meals for triglycerides  Keep other meds.  Added sleeping pill as needed at night for insomnia, 5mg  low dose, follow-up as needed.  In future, if breathing not improving we can consider CT imaging and referral to Pulm or other treatment with inhalers    Please schedule a Follow-up Appointment to: Return in about 6 months (around 01/24/2020) for 6 month follow-up DM A1c HTN, Dyspnea.  If you have any other questions or concerns, please feel free to call the office or send a message through [place]. You may also schedule an earlier appointment if necessary.  Additionally, you may be receiving a survey about your experience at our office within a few days to 1 week by e-mail or mail. We value your feedback.  Nobie Putnam, DO [place]

## 2019-07-25 NOTE — Assessment & Plan Note (Signed)
Uncontrolled cholesterol on statin, and some poor lifestyle Last lipid panel 07/2019 Calculated ASCVD 10 yr risk score elevated risk w/ DM  Plan: 1. Increase Fish Oil omega 3 - to 2000mg  capsules twice a day with meals for triglycerides 2. Continue current meds - Lovastatin 20mg  daily, Gemfibrozil 600mg  BID 3. Continue ASA 81mg  for primary ASCVD risk reduction 4. Encourage improved lifestyle - low carb/cholesterol, reduce portion size, continue improving regular exercise

## 2019-07-25 NOTE — Assessment & Plan Note (Signed)
Improving J8S to 8.3 Complications - RARE HYPOGLYCEMIA - CKD-III in setting of HTN as well, and other including hyperlipidemia - increases risk of future cardiovascular complications Off [place]bels[place]?  Plan:  1. Continue Metformin 1000 BID, Glimepiride - declined GLP1 injection 2. Encourage improved lifestyle - low carb, low sugar diet, reduce portion size, continue improving regular exercise 3. Check CBG, bring log to next visit for review 4. Continue ASA, ACEi, Statin 5. UTD DM Eye [place] 07/2019 DM Foot

## 2019-07-25 NOTE — Assessment & Plan Note (Signed)
Clinically asymptomatic today, resistant HTN Elevated BP but improved. Secondary anxiety white coat HTN - Home avg improved compared to office readings Complicated CKD-III - Prior work-up done by Vascular and Renal with negative secondary HTN work-up, negative RAS. Recent work-up negative Hyperaldosterone labs, TSH - Failed: Bystolic, Metoprolol, HCTZ, Diovan, Amlodipine - Followed by [facility] Cardiology  Plan:  1. Continue current BP regimen- continue Felodipine XL 10mg  daily, Benazepril 40mg , Clonidine 0.2mg  QID, Hydralazine 100mg  QID - torsemide PRN 2. Encourage improved lifestyle - low sodium diet, regular exercise 3. Continue monitor BP outside office, bring readings to next visit for review 4. Follow-up with Cardiology for further work-up and management as he is  Strict return precautions given to patient when to go to hospital ED if worsening symptoms with BP

## 2019-07-30 ENCOUNTER — Other Ambulatory Visit: Payer: Self-pay | Admitting: Family Medicine

## 2019-07-30 DIAGNOSIS — E1169 Type 2 diabetes mellitus with other specified complication: Secondary | ICD-10-CM

## 2019-07-30 NOTE — Telephone Encounter (Signed)
Requested Prescriptions  Pending Prescriptions Disp Refills  . gemfibrozil (LOPID) 600 MG tablet [Pharmacy Med Name: Gemfibrozil 600 MG Oral Tablet] 180 tablet 0    Sig: TAKE 1 TABLET BY MOUTH TWICE DAILY BEFORE A MEAL     Cardiovascular:  Antilipid - Fibric Acid Derivatives Failed - 07/30/2019  7:34 PM      Failed - LDL in normal range and within 360 days    LDL Cholesterol (Calc)  Date Value Ref Range Status  07/16/2019 121 (H) mg/dL (calc) Final    Comment:    Reference range: <100 . Desirable range <100 mg/dL for primary prevention;   <70 mg/dL for patients with CHD or diabetic patients  with > or = 2 CHD risk factors. Marland Kitchen LDL-C is now calculated using the Martin-Hopkins  calculation, which is a validated novel method providing  better accuracy than the Friedewald equation in the  estimation of LDL-C.  Cresenciano Genre et al. Annamaria Helling. 9833;825(05): 2061-2068  (http://education.[facility].com/faq/FAQ164)          Failed - HDL in normal range and within 360 days    HDL  Date Value Ref Range Status  07/16/2019 32 (L) > OR = 40 mg/dL Final         Failed - Triglycerides in normal range and within 360 days    Triglycerides  Date Value Ref Range Status  07/16/2019 241 (H) <150 mg/dL Final    Comment:    . If a non-fasting specimen was collected, consider repeat triglyceride testing on a fasting specimen if clinically indicated.  Yates Decamp et al. J. of Clin. Lipidol. 3976;7:341-937. .          Failed - Cr in normal range and within 180 days    Creat  Date Value Ref Range Status  07/16/2019 1.35 (H) 0.70 - 1.11 mg/dL Final    Comment:    For patients >58 years of age, the reference limit for Creatinine is approximately 13% higher for people identified as African-American. .          Failed - eGFR in normal range and within 180 days    GFR, Est African American  Date Value Ref Range Status  07/16/2019 57 (L) > OR = 60 mL/min/1.76m Final   GFR, Est Non African  American  Date Value Ref Range Status  07/16/2019 49 (L) > OR = 60 mL/min/1.741mFinal         Passed - Total Cholesterol in normal range and within 360 days    Cholesterol  Date Value Ref Range Status  07/16/2019 190 <200 mg/dL Final         Passed - ALT in normal range and within 180 days    ALT  Date Value Ref Range Status  07/16/2019 18 9 - 46 U/L Final         Passed - AST in normal range and within 180 days    AST  Date Value Ref Range Status  07/16/2019 18 10 - 35 U/L Final         Passed - Valid encounter within last 12 months    Recent Outpatient Visits          6 days ago Annual physical exam   So[place]   3 weeks ago Dyspnea on exertion   [place]   10 months ago Annual physical exam   [place]  1 year ago Type 2 diabetes mellitus with nephropathy [facility])   [place], DO   1 year ago Type 2 diabetes mellitus with nephropathy [facility])   [place], DO      Future Appointments            In 2 months Gollan, Kathlene November, MD [facility], [place]   In 6 months Parks Ranger, Devonne Doughty, DO [facility], [facility]

## 2019-09-15 ENCOUNTER — Other Ambulatory Visit: Payer: Self-pay | Admitting: Family Medicine

## 2019-09-15 DIAGNOSIS — E1121 Type 2 diabetes mellitus with diabetic nephropathy: Secondary | ICD-10-CM

## 2019-09-15 DIAGNOSIS — E1169 Type 2 diabetes mellitus with other specified complication: Secondary | ICD-10-CM

## 2019-09-15 NOTE — Telephone Encounter (Signed)
Requested Prescriptions  Pending Prescriptions Disp Refills  . glimepiride (AMARYL) 4 MG tablet [Pharmacy Med Name: Glimepiride 4 MG Oral Tablet] 90 tablet 0    Sig: Take 1 tablet by mouth once daily with breakfast     Endocrinology:  Diabetes - Sulfonylureas Failed - 09/15/2019  4:13 PM      Failed - HBA1C is between 0 and 7.9 and within 180 days    Hemoglobin A1C  Date Value Ref Range Status  08/23/2016 7.6  Final   Hgb A1c MFr Bld  Date Value Ref Range Status  07/16/2019 8.3 (H) <5.7 % of total Hgb Final    Comment:    For someone without known diabetes, a hemoglobin A1c value of 6.5% or greater indicates that they may have  diabetes and this should be confirmed with a follow-up  test. . For someone with known diabetes, a value <7% indicates  that their diabetes is well controlled and a value  greater than or equal to 7% indicates suboptimal  control. A1c targets should be individualized based on  duration of diabetes, age, comorbid conditions, and  other considerations. . Currently, no consensus exists regarding use of hemoglobin A1c for diagnosis of diabetes for children. Renella Cunas - Valid encounter within last 6 months    Recent Outpatient Visits          1 month ago Annual physical exam   [place], DO   2 months ago Dyspnea on exertion   [place], DO   1 year ago Annual physical exam   [facility] Olin Hauser, DO   1 year ago Type 2 diabetes mellitus with nephropathy [facility])   [facility] Olin Hauser, DO   1 year ago Type 2 diabetes mellitus with nephropathy [facility])   [facility] Parks Ranger, Devonne Doughty, DO      Future Appointments            In 2 weeks Marrianne Mood D, PA-C [place], [place]   In 4 months Parks Ranger, Devonne Doughty, DO [facility], [facility]

## 2019-09-15 NOTE — Telephone Encounter (Signed)
Requested Prescriptions  Pending Prescriptions Disp Refills  . lovastatin (MEVACOR) 20 MG tablet [Pharmacy Med Name: Lovastatin 20 MG Oral Tablet] 90 tablet 1    Sig: TAKE 1 TABLET BY MOUTH AT BEDTIME     Cardiovascular:  Antilipid - Statins Failed - 09/15/2019  3:44 PM      Failed - LDL in normal range and within 360 days    LDL Cholesterol (Calc)  Date Value Ref Range Status  07/16/2019 121 (H) mg/dL (calc) Final    Comment:    Reference range: <100 . Desirable range <100 mg/dL for primary prevention;   <70 mg/dL for patients with CHD or diabetic patients  with > or = 2 CHD risk factors. Marland Kitchen LDL-C is now calculated using the Martin-Hopkins  calculation, which is a validated novel method providing  better accuracy than the Friedewald equation in the  estimation of LDL-C.  Cresenciano Genre et al. Annamaria Helling. 1025;852(77): 2061-2068  (http://education.[facility].com/faq/FAQ164)          Failed - HDL in normal range and within 360 days    HDL  Date Value Ref Range Status  07/16/2019 32 (L) > OR = 40 mg/dL Final         Failed - Triglycerides in normal range and within 360 days    Triglycerides  Date Value Ref Range Status  07/16/2019 241 (H) <150 mg/dL Final    Comment:    . If a non-fasting specimen was collected, consider repeat triglyceride testing on a fasting specimen if clinically indicated.  Yates Decamp et al. J. of Clin. Lipidol. 8242;3:536-144. Marland Kitchen          Passed - Total Cholesterol in normal range and within 360 days    Cholesterol  Date Value Ref Range Status  07/16/2019 190 <200 mg/dL Final         Passed - Patient is not pregnant      Passed - Valid encounter within last 12 months    Recent Outpatient Visits          1 month ago Annual physical exam   [place], [place]   2 months ago Dyspnea on exertion   [place], DO   1 year ago Annual physical exam   [facility] Olin Hauser, DO   1 year ago Type 2 diabetes mellitus with nephropathy [facility])   [facility] Olin Hauser, DO   1 year ago Type 2 diabetes mellitus with nephropathy [facility])   [facility] Parks Ranger, Devonne Doughty, DO      Future Appointments            In 2 weeks Marrianne Mood D, PA-C [place], [place]   In 4 months Parks Ranger, Devonne Doughty, [facility], [facility]

## 2019-09-30 ENCOUNTER — Ambulatory Visit: Payer: Medicare HMO | Admitting: Cardiovascular Disease
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
## 2019-10-01 ENCOUNTER — Telehealth: Payer: Self-pay | Admitting: Physician Assistant

## 2019-10-01 ENCOUNTER — Other Ambulatory Visit: Payer: Self-pay

## 2019-10-01 ENCOUNTER — Encounter: Payer: Self-pay | Admitting: Physician Assistant

## 2019-10-01 ENCOUNTER — Ambulatory Visit: Payer: PPO | Admitting: Physician Assistant

## 2019-10-01 VITALS — BP 190/98 | HR 92 | Ht 75.0 in | Wt 302.4 lb

## 2019-10-01 DIAGNOSIS — E785 Hyperlipidemia, unspecified: Secondary | ICD-10-CM | POA: Diagnosis not present

## 2019-10-01 DIAGNOSIS — R0602 Shortness of breath: Secondary | ICD-10-CM | POA: Diagnosis not present

## 2019-10-01 DIAGNOSIS — I10 Essential (primary) hypertension: Secondary | ICD-10-CM | POA: Diagnosis not present

## 2019-10-01 DIAGNOSIS — E1169 Type 2 diabetes mellitus with other specified complication: Secondary | ICD-10-CM

## 2019-10-01 DIAGNOSIS — G4700 Insomnia, unspecified: Secondary | ICD-10-CM | POA: Diagnosis not present

## 2019-10-01 DIAGNOSIS — I5033 Acute on chronic diastolic (congestive) heart failure: Secondary | ICD-10-CM | POA: Diagnosis not present

## 2019-10-01 DIAGNOSIS — G4730 Sleep apnea, unspecified: Secondary | ICD-10-CM | POA: Diagnosis not present

## 2019-10-01 DIAGNOSIS — Z79899 Other long term (current) drug therapy: Secondary | ICD-10-CM | POA: Diagnosis not present

## 2019-10-01 DIAGNOSIS — I4891 Unspecified atrial fibrillation: Secondary | ICD-10-CM

## 2019-10-01 DIAGNOSIS — Z7189 Other specified counseling: Secondary | ICD-10-CM | POA: Diagnosis not present

## 2019-10-01 MED ORDER — ATORVASTATIN CALCIUM 80 MG PO TABS
80.0000 mg | ORAL_TABLET | Freq: Every day | ORAL | 3 refills | Status: DC
Start: 2019-10-01 — End: 2019-12-17

## 2019-10-01 MED ORDER — RIVAROXABAN 20 MG PO TABS
20.0000 mg | ORAL_TABLET | Freq: Every day | ORAL | 3 refills | Status: DC
Start: 2019-10-01 — End: 2019-10-17

## 2019-10-01 MED ORDER — TORSEMIDE 20 MG PO TABS
20.0000 mg | ORAL_TABLET | Freq: Every day | ORAL | 3 refills | Status: DC
Start: 2019-10-01 — End: 2019-10-10

## 2019-10-01 NOTE — Patient Instructions (Addendum)
Medication Instructions:  Your physician has recommended you make the following change in your medication:  1- START Xarelto 20 mg (1 tablet) by mouth daily with supper. 2- START Torsemide 20 mg (1 tablet) by mouth once a day. 3- STOP Lovastatin. 4- START Atorvastatin 80 mg (1 tablet) by mouth once a day.  *If you need a refill on your cardiac medications before your next appointment, please call your pharmacy*   Lab Work: 1 - Your physician recommends that you return for lab work in: [place], CBC.  2 - Your physician recommends that you return for lab work in: 2 WEEKS ~ 10/15/19 for BMET, CBC. - Prefer to have them done within 1-2 days prior to follow up appointment.  - Please go to the [facility]. You will check in at the front desk to the right as you walk into the atrium. Valet Parking is offered if needed. - No appointment needed. You may go any day between 7 am and 6 pm.  If you have labs (blood work) drawn today and your tests are completely normal, you will receive your results only by: Marland Kitchen MyChart Message (if you have MyChart) OR . A paper copy in the mail If you have any lab test that is abnormal or we need to change your treatment, we will call you to review the results.  Testing/Procedures: none  Follow-Up: You have been referred to Pulmonology for Sleep study evaluation. See referral information on follow page. If you do not receive a call from someone to schedule after 1-2 weeks, please call them.   At [facility], you and your health needs are our priority.  As part of our continuing mission to provide you with exceptional heart care, we have created designated Provider Care Teams.  These Care Teams include your primary Cardiologist (physician) and Advanced Practice Providers (APPs -  Physician Assistants and Nurse Practitioners) who all work together to provide you with the care you need, when you need it.  We recommend signing up for the patient portal  called "MyChart".  Sign up information is provided on this After Visit Summary.  MyChart is used to connect with patients for Virtual Visits (Telemedicine).  Patients are able to view lab/test results, encounter notes, upcoming appointments, etc.  Non-urgent messages can be sent to your provider as well.   To learn more about what you can do with MyChart, go to [email or web address].    Your next appointment:   2 week(s)  The format for your next appointment:   In Person  Provider:    You may see Ida Rogue, MD or one of the following Advanced Practice Providers on your designated Care Team:   Marrianne Mood, [place]

## 2019-10-01 NOTE — Telephone Encounter (Signed)
Can you please help this patient.  He is calling saying we have at activate his Co-pay card and then the medicine will be mailed to [place]. I think he might be a little confused.

## 2019-10-01 NOTE — Telephone Encounter (Signed)
Patient calling to say disregard message .  He went to another pharmacy

## 2019-10-01 NOTE — Telephone Encounter (Signed)
Patient told at pharmacy md office has to activate/ call in the copay / discount card   Please call to discuss

## 2019-10-01 NOTE — Telephone Encounter (Signed)
[place] and gave them the Xarelto 30-day free BIN/Group and ID number. She ran it through and it went through.  Called patient and he was appreciative. He had already picked up his prescriptions from the [facility].

## 2019-10-02 LAB — CBC WITH DIFFERENTIAL/PLATELET
Basophils Absolute: 0.1 10*3/uL (ref 0.0–0.2)
Basos: 1 %
EOS (ABSOLUTE): 0.1 10*3/uL (ref 0.0–0.4)
Eos: 2 %
Hematocrit: 35.5 % — ABNORMAL LOW (ref 37.5–51.0)
Hemoglobin: 11.5 g/dL — ABNORMAL LOW (ref 13.0–17.7)
Immature Grans (Abs): 0.1 10*3/uL (ref 0.0–0.1)
Immature Granulocytes: 1 %
Lymphocytes Absolute: 1.2 10*3/uL (ref 0.7–3.1)
Lymphs: 22 %
MCH: 27.6 pg (ref 26.6–33.0)
MCHC: 32.4 g/dL (ref 31.5–35.7)
MCV: 85 fL (ref 79–97)
Monocytes Absolute: 0.7 10*3/uL (ref 0.1–0.9)
Monocytes: 12 %
Neutrophils Absolute: 3.4 10*3/uL (ref 1.4–7.0)
Neutrophils: 62 %
Platelets: 155 10*3/uL (ref 150–450)
RBC: 4.17 x10E6/uL (ref 4.14–5.80)
RDW: 13.2 % (ref 11.6–15.4)
WBC: 5.6 10*3/uL (ref 3.4–10.8)

## 2019-10-02 LAB — COMPREHENSIVE METABOLIC PANEL
ALT: 15 IU/L (ref 0–44)
AST: 16 IU/L (ref 0–40)
Albumin/Globulin Ratio: 2.3 — ABNORMAL HIGH (ref 1.2–2.2)
Albumin: 4.8 g/dL — ABNORMAL HIGH (ref 3.7–4.7)
Alkaline Phosphatase: 36 IU/L — ABNORMAL LOW (ref 48–121)
BUN/Creatinine Ratio: 17 (ref 10–24)
BUN: 22 mg/dL (ref 8–27)
Bilirubin Total: 0.3 mg/dL (ref 0.0–1.2)
CO2: 19 mmol/L — ABNORMAL LOW (ref 20–29)
Calcium: 9.6 mg/dL (ref 8.6–10.2)
Chloride: 103 mmol/L (ref 96–106)
Creatinine, Ser: 1.33 mg/dL — ABNORMAL HIGH (ref 0.76–1.27)
GFR calc Af Amer: 58 mL/min/{1.73_m2} — ABNORMAL LOW (ref >59)
GFR calc non Af Amer: 50 mL/min/{1.73_m2} — ABNORMAL LOW (ref >59)
Globulin, Total: 2.1 g/dL (ref 1.5–4.5)
Glucose: 223 mg/dL — ABNORMAL HIGH (ref 65–99)
Potassium: 4.9 mmol/L (ref 3.5–5.2)
Sodium: 144 mmol/L (ref 134–144)
Total Protein: 6.9 g/dL (ref 6.0–8.5)

## 2019-10-03 ENCOUNTER — Telehealth: Payer: Self-pay

## 2019-10-03 NOTE — Telephone Encounter (Signed)
-----   Message from Arvil Chaco, PA-C sent at 10/02/2019 10:49 PM EDT ----- Chemistry:  Renal function stable with previous labs.  Glucose elevated above usual non-fasting labs.  Continue to work with PCP to control blood sugar.  Potassium high normal - not concerning as usually on higher side if elevated glucose.  Blood counts:  Hemoglobin slightly reduced from that of previous, ongoing finding as noted by PCP in past and not concerning given no sudden jumps or changes consistent with blood loss.   Liver function: AST/ALT liver function stable.   Labs -->  ~Obtain in ~ 2 weeks (1) TSH (new Afib) (2) BMET (after restarted torsemide) (3) CBC (new Xarelto) ~Obtain in 6-8 weeks (1) Liver and lipids (statin)  Will Cc PCP

## 2019-10-03 NOTE — Telephone Encounter (Signed)
Call to patient to review labs.    Pt verbalized understanding and has no further questions at this time.    Advised pt to call for any further questions or concerns.  Confirmed upcoming appt aug 10. Labs to be checked at that time.

## 2019-10-09 ENCOUNTER — Other Ambulatory Visit: Payer: Self-pay

## 2019-10-09 ENCOUNTER — Other Ambulatory Visit
Admission: RE | Admit: 2019-10-09 | Discharge: 2019-10-09 | Disposition: A | Discharge status: Home / Self Care | Payer: PPO | Attending: Physician Assistant | Admitting: Physician Assistant

## 2019-10-09 DIAGNOSIS — I4891 Unspecified atrial fibrillation: Secondary | ICD-10-CM | POA: Diagnosis not present

## 2019-10-09 DIAGNOSIS — Z79899 Other long term (current) drug therapy: Secondary | ICD-10-CM | POA: Insufficient documentation

## 2019-10-09 LAB — CBC WITH DIFFERENTIAL/PLATELET
Abs Immature Granulocytes: 0.03 10*3/uL (ref 0.00–0.07)
Basophils Absolute: 0 10*3/uL (ref 0.0–0.1)
Basophils Relative: 0 %
Eosinophils Absolute: 0.2 10*3/uL (ref 0.0–0.5)
Eosinophils Relative: 3 %
HCT: 36.6 % — ABNORMAL LOW (ref 39.0–52.0)
Hemoglobin: 11.8 g/dL — ABNORMAL LOW (ref 13.0–17.0)
Immature Granulocytes: 0 %
Lymphocytes Relative: 24 %
Lymphs Abs: 1.7 10*3/uL (ref 0.7–4.0)
MCH: 28.5 pg (ref 26.0–34.0)
MCHC: 32.2 g/dL (ref 30.0–36.0)
MCV: 88.4 fL (ref 80.0–100.0)
Monocytes Absolute: 0.6 10*3/uL (ref 0.1–1.0)
Monocytes Relative: 9 %
Neutro Abs: 4.5 10*3/uL (ref 1.7–7.7)
Neutrophils Relative %: 64 %
Platelets: 171 10*3/uL (ref 150–400)
RBC: 4.14 MIL/uL — ABNORMAL LOW (ref 4.22–5.81)
RDW: 14.1 % (ref 11.5–15.5)
WBC: 7.1 10*3/uL (ref 4.0–10.5)
nRBC: 0 % (ref 0.0–0.2)

## 2019-10-09 LAB — BASIC METABOLIC PANEL
Anion gap: 12 (ref 5–15)
BUN: 36 mg/dL — ABNORMAL HIGH (ref 8–23)
CO2: 22 mmol/L (ref 22–32)
Calcium: 9.3 mg/dL (ref 8.9–10.3)
Chloride: 107 mmol/L (ref 98–111)
Creatinine, Ser: 1.42 mg/dL — ABNORMAL HIGH (ref 0.61–1.24)
GFR calc Af Amer: 54 mL/min — ABNORMAL LOW (ref >60)
GFR calc non Af Amer: 46 mL/min — ABNORMAL LOW (ref >60)
Glucose, Bld: 176 mg/dL — ABNORMAL HIGH (ref 70–99)
Potassium: 4.6 mmol/L (ref 3.5–5.1)
Sodium: 141 mmol/L (ref 135–145)

## 2019-10-10 ENCOUNTER — Other Ambulatory Visit: Payer: Self-pay

## 2019-10-10 ENCOUNTER — Telehealth: Payer: Self-pay

## 2019-10-10 DIAGNOSIS — E1169 Type 2 diabetes mellitus with other specified complication: Secondary | ICD-10-CM

## 2019-10-10 MED ORDER — TORSEMIDE 20 MG PO TABS: 20.0000 mg | ORAL_TABLET | ORAL | Status: AC

## 2019-10-10 NOTE — Telephone Encounter (Signed)
Patient made aware of lab results and JV recommendation. Reviewed all of the patients medications. Patients med list is update and correct.  Patient is taking everything listed as prescribed. He was confused when he was asked about Imdur he did not recognize that it is also Isosorbide.  Patient will reduce Torsemide to 20 mg every other day. He is scheduled to see JV on 10/15/19 and would like to do the repeat bmet that day.  Patient sts that he will not be able to afford Xarelto. His out of pocket cost per refill would be $500. He did get a free 30 days and has a couple of weeks supply on hand. He also would like ton know if he needs to continue Asa 81 mg daily.  Adv the patient that he can discuss further with JV at his 10/15/19 appt with her.

## 2019-10-10 NOTE — Telephone Encounter (Signed)
-----   Message from Arvil Chaco, PA-C sent at 10/09/2019  3:41 PM EDT ----- Renal function showed slight bump from previous. Let's decrease him down to torsemide 20mg  every other day and recheck a BMET again in 1 week.   Hemoglobin is stable from labs 1 week ago and since starting on his Xarelto - continue.

## 2019-10-15 ENCOUNTER — Encounter: Payer: Self-pay | Admitting: Physician Assistant

## 2019-10-15 ENCOUNTER — Other Ambulatory Visit: Payer: Self-pay

## 2019-10-15 ENCOUNTER — Ambulatory Visit: Payer: PPO | Admitting: Physician Assistant

## 2019-10-15 VITALS — BP 158/70 | HR 83 | Ht 75.0 in | Wt 291.4 lb

## 2019-10-15 DIAGNOSIS — I4891 Unspecified atrial fibrillation: Secondary | ICD-10-CM

## 2019-10-15 DIAGNOSIS — E1121 Type 2 diabetes mellitus with diabetic nephropathy: Secondary | ICD-10-CM | POA: Diagnosis not present

## 2019-10-15 DIAGNOSIS — E782 Mixed hyperlipidemia: Secondary | ICD-10-CM | POA: Diagnosis not present

## 2019-10-15 DIAGNOSIS — I10 Essential (primary) hypertension: Secondary | ICD-10-CM | POA: Diagnosis not present

## 2019-10-15 DIAGNOSIS — I4819 Other persistent atrial fibrillation: Secondary | ICD-10-CM

## 2019-10-15 DIAGNOSIS — Z79899 Other long term (current) drug therapy: Secondary | ICD-10-CM
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
## 2019-10-15 NOTE — Patient Instructions (Addendum)
Medication Instructions:  Your physician recommends that you continue on your current medications as directed. Please refer to the Current Medication list given to you today.  *If you need a refill on your cardiac medications before your next appointment, please call your pharmacy*   Lab Work:Your physician recommends that you have lab work today(BMET, CBC)   If you have labs (blood work) drawn today and your tests are completely normal, you will receive your results only by: Marland Kitchen MyChart Message (if you have MyChart) OR . A paper copy in the mail If you have any lab test that is abnormal or we need to change your treatment, we will call you to review the results.   Testing/Procedures:none ordered    Follow-Up: At [facility], you and your health needs are our priority.  As part of our continuing mission to provide you with exceptional heart care, we have created designated Provider Care Teams.  These Care Teams include your primary Cardiologist (physician) and Advanced Practice Providers (APPs -  Physician Assistants and Nurse Practitioners) who all work together to provide you with the care you need, when you need it.  We recommend signing up for the patient portal called "MyChart".  Sign up information is provided on this After Visit Summary.  MyChart is used to connect with patients for Virtual Visits (Telemedicine).  Patients are able to view lab/test results, encounter notes, upcoming appointments, etc.  Non-urgent messages can be sent to your provider as well.   To learn more about what you can do with MyChart, go to [email or web address].    Your next appointment:  Late sept with MD or app  Other Instructions  From Marrianne Mood, PA-C --Check labs today. Based on labs, we will call you and make changes to your blood pressure medications.  --Ideally, we would like to wean you off of your clonidine if possible.  --Check your blood pressure at home - keep a log to bring into  your next visit. Check your blood pressure 2 hours after taking your blood pressure medications in the AM and again in the PM at consistent times. --Think about cardioversion, discussed today, and which can be done once on your Xarelto for 1 full month.  --Let us know if any issues with your Xarelto cost. Coupons and assistance number to be provided today. --Follow-up at end of September. Let us know if issues before that time by responding directly to my MyChart message.

## 2019-10-16 LAB — CBC
Hematocrit: 37.1 % — ABNORMAL LOW (ref 37.5–51.0)
Hemoglobin: 12 g/dL — ABNORMAL LOW (ref 13.0–17.7)
MCH: 27.8 pg (ref 26.6–33.0)
MCHC: 32.3 g/dL (ref 31.5–35.7)
MCV: 86 fL (ref 79–97)
Platelets: 156 10*3/uL (ref 150–450)
RBC: 4.32 x10E6/uL (ref 4.14–5.80)
RDW: 13.6 % (ref 11.6–15.4)
WBC: 6.9 10*3/uL (ref 3.4–10.8)

## 2019-10-16 LAB — BASIC METABOLIC PANEL
BUN/Creatinine Ratio: 21 (ref 10–24)
BUN: 29 mg/dL — ABNORMAL HIGH (ref 8–27)
CO2: 20 mmol/L (ref 20–29)
Calcium: 9.9 mg/dL (ref 8.6–10.2)
Chloride: 105 mmol/L (ref 96–106)
Creatinine, Ser: 1.41 mg/dL — ABNORMAL HIGH (ref 0.76–1.27)
GFR calc Af Amer: 54 mL/min/{1.73_m2} — ABNORMAL LOW (ref >59)
GFR calc non Af Amer: 47 mL/min/{1.73_m2} — ABNORMAL LOW (ref >59)
Glucose: 163 mg/dL — ABNORMAL HIGH (ref 65–99)
Potassium: 5.1 mmol/L (ref 3.5–5.2)
Sodium: 146 mmol/L — ABNORMAL HIGH (ref 134–144)

## 2019-10-17 ENCOUNTER — Telehealth: Payer: Self-pay | Admitting: Physician Assistant

## 2019-10-17 ENCOUNTER — Telehealth: Payer: Self-pay | Admitting: *Deleted

## 2019-10-17 ENCOUNTER — Other Ambulatory Visit: Payer: Self-pay

## 2019-10-17 DIAGNOSIS — I4819 Other persistent atrial fibrillation: Secondary | ICD-10-CM

## 2019-10-17 MED ORDER — RIVAROXABAN 20 MG PO TABS: 20.0000 mg | ORAL_TABLET | Freq: Every day | ORAL | 1 refills | Status: DC

## 2019-10-17 NOTE — Telephone Encounter (Signed)
-----   Message from Arvil Chaco, PA-C sent at 10/16/2019  9:36 PM EDT ----- Blood counts stable on Xarelto. Renal function appears stable and thus may be his new baseline. Let's continue his current torsemide 20mg  every other day and add chlorthalidone 12.5mg  qd with repeat BMET in 1-2 weeks. Please have him monitor his pressures 2 hours after his morning medications at home, if possible, and send those to me via MyChart with goal BP 130/80 or lower.

## 2019-10-17 NOTE — Addendum Note (Signed)
Addended by: Brynda Peon on: 10/17/2019 10:14 AM   Modules accepted: Orders

## 2019-10-17 NOTE — Telephone Encounter (Signed)
Pt c/o BP issue: STAT if pt c/o blurred vision, one-sided weakness or slurred speech  1. What are your last 5 BP readings?   8/11 138/94  79  8/11 128/81  80  8/12 118/67  81  2. Are you having any other symptoms (ex. Dizziness, headache, blurred vision, passed out)? no  3. What is your BP issue? Very low for patient. States his BP is usually in  The 145's

## 2019-10-17 NOTE — Telephone Encounter (Signed)
-----   Message from Arvil Chaco, PA-C sent at 10/16/2019  8:22 PM EDT ----- Regarding: Cardioversion Hi there,  Please call Mr. Markovitz and have him scheduled for cardioversion (new Afib with RVR) with Dr. Rockey Situ for anytime after 11/01/19 (as he needs to be on his Xarelto for 1 month).   He mentioned in a MyChart message to me that he is having a pharmacist call me for a Xarelto prescription. I was out of the office today but do not see that anyone reached out about this prescription -- just an FYI in case he brings it up.  Thank you!

## 2019-10-17 NOTE — Telephone Encounter (Signed)
*  STAT* If patient is at the pharmacy, call can be transferred to refill team.   1. Which medications need to be refilled? (please list name of each medication and dose if known) Xarelto  2. Which pharmacy/location (including street and city if local pharmacy) is medication to be sent to? [place] [address] Ph: [phone] Fax: [phone]  3. Do they need a 30 day or 90 day supply? Not specified  Patient has enrolled in [employer], a cost-savings program for Xarelto.

## 2019-10-17 NOTE — Telephone Encounter (Signed)
Pt last saw Gardenia Phlegm, PA on 10/15/19, last labs 10/15/19 Creat 1.41, age 79, weight 132.2kg, CrCl 78.13, based on CrCl pt is on appropriate dosage of Xarelto 20mg  QD.  Will refill rx.

## 2019-10-17 NOTE — Telephone Encounter (Signed)
Called patient and gave him his appointment time for his cardioversion along with the following instructions. Patient verbalized understanding and agreed with plan.  You are scheduled for a Cardioversion on ___Sept_24th_________ with Dr.___Gollan________ Please arrive at the [facility] at __0630_______ a.m. on the day of your procedure.  DIET INSTRUCTIONS:  Nothing to eat or drink after midnight except your medications with a sip of water.         1) Labs: _____BMET, CBC___(we will draw labs during your apt. with Jacquelyn on 9/23.) 2) COVID drive through test to be done on Wed. 9/22 between 8am-1pm  3) Medications:  YOU MAY TAKE ALL of your remaining medications with a small amount of water.  4) Must have a responsible person to drive you home.  5) Bring a current list of your medications and current insurance cards.    If you have any questions after you get home, please call the office at [phone]

## 2019-10-17 NOTE — Telephone Encounter (Signed)
Patient made aware of lab results and an d Robert Mood, PA's recommendation. Patient will [facility] repeat [facility].  Patient provided BP readings below. Patient sts that he feels great and his BP has been well controlled on his current medication regimen. I adv him to continue to monitor his BP and send his readings through mychart next week. Adv the patient that I will fwd the update to JV.  Patient verbalized understanding and voiced appreciation for the call.

## 2019-10-22 ENCOUNTER — Telehealth: Payer: Self-pay | Admitting: Cardiovascular Disease

## 2019-10-22 NOTE — Telephone Encounter (Signed)
Spoke with patient and he states that it was due to low blood sugar. Advised that he check with primary care provider regarding these drops in his blood sugar to see if they have any recommendations especially if it continues. He verbalized understanding and was appreciative for the call back to follow up. No further questions at this time.

## 2019-10-22 NOTE — Telephone Encounter (Signed)
Patient calling back to state the problem was due to his sugar dropping. States the problem has been taken care of and that he does not need a call back

## 2019-10-22 NOTE — Telephone Encounter (Signed)
Patient states once in a while his speech gets slurred and his vision gets blurry. States this happened yesterday, and didn't last very long. States he had no symptoms. States he thought it may have been his sugar, but he checked his sugar and it was fine. States it has happened every 2 weeks. Please call to confirm. States as of now, he feels "fine".

## 2019-10-24 ENCOUNTER — Telehealth: Payer: Self-pay | Admitting: Cardiovascular Disease

## 2019-10-24 ENCOUNTER — Other Ambulatory Visit: Payer: Self-pay | Admitting: Physician Assistant

## 2019-10-24 NOTE — Telephone Encounter (Signed)
Spoke with patient and absolutely refuses to pay cost of xarelto. He states that he tried assistance application and he did not qualify for assistance. So he wants to start on a generic medication that does not cost that much. Advised that I would need to send to provider for review and recommendations. He states he is supposed to have DCCV next month and reviewed that if he stops medication then that would need to be rescheduled. He verbalized understanding with no further questions at this time.

## 2019-10-24 NOTE — Telephone Encounter (Signed)
Patient calling  States he went in for refill for Xarelto 20mg  Medication is $510 and patient is not going to pay this price, wants to be switched to a generic or another medication Please call to discuss options

## 2019-10-25 NOTE — Telephone Encounter (Signed)
Thank you for your assistance with sorting through all of this with [place].   Can someone call him and arrange pick up of samples?   Thank you to everyone!

## 2019-10-25 NOTE — Telephone Encounter (Signed)
I called and spoke with the patient. I advised him that I did not have 63 tablets of Xarelto in the office at this time, but I do have a 4 week supply of medication for him that he can come pick up.   The patient has been advised to call the office when he has ~ 1 bottle of samples left and we will try to supply him with more to get him through the needed period.  The patient voices understanding and was very appreciative for the call and the assistance. He is aware to pick up samples at the front desk of our office.   Samples Given: Xarelto 20 mg Lot: [id number] Exp: 2/23 # 4 bottles given (28 tablets)   Further samples needed = 35 tablets

## 2019-10-25 NOTE — Telephone Encounter (Addendum)
The only generic option is warfarin. I called patient insurance and he does not have a deducible, nor is he in the coverage gap. Cost is $90 or a 90 days supply. Called patient who states that $90/90 DS is still too expensive. We discussed that the only generic option is warfarin which needs to be monitored. Since patient is pending DCCV, it is not an ideal time to switch to warfarin as his cardioversion would have to be delayed most likely. I will see about sampling patient though his cardioversion and the 4 weeks post, and then switching him to warfarin at that time. Patient agreeable to this. (maybe pradaxa will go generic in the next few months) Told patient I would reach out to [place] office to see is they had enough to sample him (would need about 63 tablets).  I will call him back with final plan.

## 2019-10-28 ENCOUNTER — Other Ambulatory Visit
Admission: RE | Admit: 2019-10-28 | Discharge: 2019-10-28 | Disposition: A | Discharge status: Home / Self Care | Payer: PPO | Attending: Physician Assistant | Admitting: Physician Assistant

## 2019-10-28 ENCOUNTER — Other Ambulatory Visit: Payer: Self-pay

## 2019-10-28 DIAGNOSIS — I4819 Other persistent atrial fibrillation: Secondary | ICD-10-CM | POA: Diagnosis not present

## 2019-10-28 DIAGNOSIS — Z139 Encounter for screening, unspecified: Secondary | ICD-10-CM | POA: Diagnosis present

## 2019-10-28 LAB — BASIC METABOLIC PANEL
Anion gap: 14 (ref 5–15)
BUN: 39 mg/dL — ABNORMAL HIGH (ref 8–23)
CO2: 22 mmol/L (ref 22–32)
Calcium: 9.2 mg/dL (ref 8.9–10.3)
Chloride: 106 mmol/L (ref 98–111)
Creatinine, Ser: 1.39 mg/dL — ABNORMAL HIGH (ref 0.61–1.24)
GFR calc Af Amer: 55 mL/min — ABNORMAL LOW (ref >60)
GFR calc non Af Amer: 48 mL/min — ABNORMAL LOW (ref >60)
Glucose, Bld: 170 mg/dL — ABNORMAL HIGH (ref 70–99)
Potassium: 4.3 mmol/L (ref 3.5–5.1)
Sodium: 142 mmol/L (ref 135–145)

## 2019-10-28 LAB — CBC
HCT: 35.2 % — ABNORMAL LOW (ref 39.0–52.0)
Hemoglobin: 11 g/dL — ABNORMAL LOW (ref 13.0–17.0)
MCH: 27.7 pg (ref 26.0–34.0)
MCHC: 31.3 g/dL (ref 30.0–36.0)
MCV: 88.7 fL (ref 80.0–100.0)
Platelets: 195 10*3/uL (ref 150–400)
RBC: 3.97 MIL/uL — ABNORMAL LOW (ref 4.22–5.81)
RDW: 13.8 % (ref 11.5–15.5)
WBC: 8.2 10*3/uL (ref 4.0–10.5)
nRBC: 0 % (ref 0.0–0.2)

## 2019-10-28 NOTE — Addendum Note (Signed)
Addended by: Sunday Spillers on: 10/28/2019 10:10 AM   Modules accepted: Orders

## 2019-11-22 ENCOUNTER — Other Ambulatory Visit: Payer: Self-pay | Admitting: Family

## 2019-11-22 ENCOUNTER — Other Ambulatory Visit: Payer: Self-pay | Admitting: Family Medicine

## 2019-11-22 DIAGNOSIS — I10 Essential (primary) hypertension: Secondary | ICD-10-CM

## 2019-11-23 ENCOUNTER — Other Ambulatory Visit: Payer: Self-pay | Admitting: Family

## 2019-11-23 DIAGNOSIS — I10 Essential (primary) hypertension: Secondary | ICD-10-CM

## 2019-11-25 ENCOUNTER — Other Ambulatory Visit: Payer: Self-pay | Admitting: Family

## 2019-11-25 DIAGNOSIS — I10 Essential (primary) hypertension: Secondary | ICD-10-CM

## 2019-11-26 ENCOUNTER — Other Ambulatory Visit: Payer: Self-pay

## 2019-11-26 ENCOUNTER — Other Ambulatory Visit
Admission: RE | Admit: 2019-11-26 | Discharge: 2019-11-26 | Disposition: A | Discharge status: Home / Self Care | Payer: PPO | Source: Ambulatory Visit | Attending: Cardiovascular Disease | Admitting: Cardiovascular Disease

## 2019-11-26 DIAGNOSIS — Z20822 Contact with and (suspected) exposure to covid-19: Secondary | ICD-10-CM | POA: Insufficient documentation

## 2019-11-26 DIAGNOSIS — Z01812 Encounter for preprocedural laboratory examination: Secondary | ICD-10-CM | POA: Diagnosis not present

## 2019-11-27 ENCOUNTER — Other Ambulatory Visit: Payer: PPO

## 2019-11-27 LAB — SARS CORONAVIRUS 2 (TAT 6-24 HRS): SARS Coronavirus 2: NEGATIVE

## 2019-11-28 ENCOUNTER — Other Ambulatory Visit: Payer: Self-pay

## 2019-11-28 ENCOUNTER — Ambulatory Visit (INDEPENDENT_AMBULATORY_CARE_PROVIDER_SITE_OTHER): Payer: PPO | Admitting: Physician Assistant

## 2019-11-28 ENCOUNTER — Encounter: Payer: Self-pay | Admitting: Physician Assistant

## 2019-11-28 VITALS — BP 152/78 | HR 93 | Ht 75.0 in | Wt 289.4 lb

## 2019-11-28 DIAGNOSIS — I10 Essential (primary) hypertension: Secondary | ICD-10-CM

## 2019-11-28 DIAGNOSIS — G4730 Sleep apnea, unspecified: Secondary | ICD-10-CM

## 2019-11-28 DIAGNOSIS — I5033 Acute on chronic diastolic (congestive) heart failure: Secondary | ICD-10-CM

## 2019-11-28 DIAGNOSIS — Z79899 Other long term (current) drug therapy: Secondary | ICD-10-CM | POA: Diagnosis not present

## 2019-11-28 DIAGNOSIS — I4819 Other persistent atrial fibrillation: Secondary | ICD-10-CM | POA: Diagnosis not present

## 2019-11-28 DIAGNOSIS — E785 Hyperlipidemia, unspecified: Secondary | ICD-10-CM

## 2019-11-28 DIAGNOSIS — E1169 Type 2 diabetes mellitus with other specified complication: Secondary | ICD-10-CM | POA: Diagnosis not present
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
## 2019-11-28 NOTE — Patient Instructions (Signed)
Medication Instructions:  Your physician has recommended you make the following change in your medication:  1- INCREASE Torsemide to 40 mg by mouth every other day for 3 doses, then return to 20 mg by mouth every other day.  *If you need a refill on your cardiac medications before your next appointment, please call your pharmacy*  Lab Work: Your physician recommends that you return for lab work in: 1 week for BMET at [facility].  - [facility]. You will check in at the front desk to the right as you walk into the atrium. Valet Parking is offered if needed. - No appointment needed. You may go any day between 7 am and 6 pm.  If you have labs (blood work) drawn today and your tests are completely normal, you will receive your results only by: Marland Kitchen MyChart Message (if you have MyChart) OR . A paper copy in the mail If you have any lab test that is abnormal or we need to change your treatment, we will call you to review the results.  Testing/Procedures: none  Follow-Up: At [facility], you and your health needs are our priority.  As part of our continuing mission to provide you with exceptional heart care, we have created designated Provider Care Teams.  These Care Teams include your primary Cardiologist (physician) and Advanced Practice Providers (APPs -  Physician Assistants and Nurse Practitioners) who all work together to provide you with the care you need, when you need it.  We recommend signing up for the patient portal called "MyChart".  Sign up information is provided on this After Visit Summary.  MyChart is used to connect with patients for Virtual Visits (Telemedicine).  Patients are able to view lab/test results, encounter notes, upcoming appointments, etc.  Non-urgent messages can be sent to your provider as well.   To learn more about what you can do with MyChart, go to [email or web address].    Your next appointment:   1-2 week(s) post  cardioversion  The format for your next appointment:   In Person  Provider:   You may see Ida Rogue, MD or one of the following Advanced Practice Providers on your designated Care Team:    Murray Hodgkins, NP  Christell Faith, PA-C  Marrianne Mood, PA-C  Cadence [place], [place]
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
## 2019-11-29 ENCOUNTER — Other Ambulatory Visit: Payer: Self-pay

## 2019-11-29 ENCOUNTER — Encounter
Admission: RE | Disposition: A | Discharge status: Home / Self Care | Payer: Self-pay | Source: Home / Self Care | Attending: Cardiovascular Disease

## 2019-11-29 ENCOUNTER — Telehealth: Payer: Self-pay | Admitting: Physician Assistant

## 2019-11-29 ENCOUNTER — Telehealth: Payer: Self-pay

## 2019-11-29 ENCOUNTER — Ambulatory Visit: Payer: PPO | Admitting: Anesthesiology

## 2019-11-29 ENCOUNTER — Encounter: Payer: Self-pay | Admitting: Cardiovascular Disease

## 2019-11-29 ENCOUNTER — Ambulatory Visit
Admission: RE | Admit: 2019-11-29 | Discharge: 2019-11-29 | Disposition: A | Discharge status: Home / Self Care | Payer: PPO | Attending: Cardiovascular Disease | Admitting: Cardiovascular Disease

## 2019-11-29 DIAGNOSIS — Z888 Allergy status to other drugs, medicaments and biological substances status: Secondary | ICD-10-CM | POA: Diagnosis not present

## 2019-11-29 DIAGNOSIS — E1122 Type 2 diabetes mellitus with diabetic chronic kidney disease: Secondary | ICD-10-CM | POA: Diagnosis not present

## 2019-11-29 DIAGNOSIS — Z7982 Long term (current) use of aspirin: Secondary | ICD-10-CM | POA: Insufficient documentation

## 2019-11-29 DIAGNOSIS — I4819 Other persistent atrial fibrillation: Secondary | ICD-10-CM | POA: Diagnosis not present

## 2019-11-29 DIAGNOSIS — Z7901 Long term (current) use of anticoagulants: Secondary | ICD-10-CM | POA: Insufficient documentation

## 2019-11-29 DIAGNOSIS — N189 Chronic kidney disease, unspecified: Secondary | ICD-10-CM | POA: Diagnosis not present

## 2019-11-29 DIAGNOSIS — I10 Essential (primary) hypertension: Secondary | ICD-10-CM | POA: Diagnosis not present

## 2019-11-29 DIAGNOSIS — E785 Hyperlipidemia, unspecified: Secondary | ICD-10-CM | POA: Diagnosis not present

## 2019-11-29 DIAGNOSIS — F419 Anxiety disorder, unspecified: Secondary | ICD-10-CM | POA: Insufficient documentation

## 2019-11-29 DIAGNOSIS — I4891 Unspecified atrial fibrillation: Secondary | ICD-10-CM | POA: Diagnosis not present

## 2019-11-29 DIAGNOSIS — Z9841 Cataract extraction status, right eye: Secondary | ICD-10-CM | POA: Insufficient documentation

## 2019-11-29 DIAGNOSIS — Z9842 Cataract extraction status, left eye: Secondary | ICD-10-CM | POA: Insufficient documentation

## 2019-11-29 DIAGNOSIS — Z79899 Other long term (current) drug therapy: Secondary | ICD-10-CM | POA: Diagnosis not present

## 2019-11-29 DIAGNOSIS — I130 Hypertensive heart and chronic kidney disease with heart failure and stage 1 through stage 4 chronic kidney disease, or unspecified chronic kidney disease: Secondary | ICD-10-CM | POA: Insufficient documentation

## 2019-11-29 DIAGNOSIS — I5033 Acute on chronic diastolic (congestive) heart failure: Secondary | ICD-10-CM | POA: Insufficient documentation

## 2019-11-29 DIAGNOSIS — Z791 Long term (current) use of non-steroidal anti-inflammatories (NSAID): Secondary | ICD-10-CM | POA: Insufficient documentation

## 2019-11-29 DIAGNOSIS — Z7984 Long term (current) use of oral hypoglycemic drugs: Secondary | ICD-10-CM | POA: Insufficient documentation

## 2019-11-29 DIAGNOSIS — G4733 Obstructive sleep apnea (adult) (pediatric): Secondary | ICD-10-CM | POA: Diagnosis not present

## 2019-11-29 DIAGNOSIS — D631 Anemia in chronic kidney disease: Secondary | ICD-10-CM | POA: Insufficient documentation

## 2019-11-29 DIAGNOSIS — E1121 Type 2 diabetes mellitus with diabetic nephropathy: Secondary | ICD-10-CM | POA: Diagnosis not present

## 2019-11-29 HISTORY — PX: CARDIOVERSION: SHX1299

## 2019-11-29 LAB — GLUCOSE, CAPILLARY: Glucose-Capillary: 171 mg/dL — ABNORMAL HIGH (ref 70–99)

## 2019-11-29 SURGERY — CARDIOVERSION
Anesthesia: General

## 2019-11-29 MED ORDER — PHENYLEPHRINE HCL (PRESSORS) 10 MG/ML IV SOLN
INTRAVENOUS | Status: AC
  Filled 2019-11-29: qty 1

## 2019-11-29 MED ORDER — PROPOFOL 10 MG/ML IV BOLUS
INTRAVENOUS | Status: DC | PRN
  Administered 2019-11-29: 80 mg via INTRAVENOUS
  Administered 2019-11-29: 30 mg via INTRAVENOUS

## 2019-11-29 MED ORDER — SODIUM CHLORIDE 0.9 % IV SOLN: INTRAVENOUS | Status: DC | PRN

## 2019-11-29 MED ORDER — PROPOFOL 10 MG/ML IV BOLUS
INTRAVENOUS | Status: AC
  Filled 2019-11-29: qty 20

## 2019-11-29 NOTE — Telephone Encounter (Signed)
Pt c/o medication issue:  1. Name of Medication: blood pressure medicine   2. How are you currently taking this medication (dosage and times per day)? Increased dose yesterday   3. Are you having a reaction (difficulty breathing--STAT)? Decreased bp   4. What is your medication issue? Patient was told to increase bp medication for the next few days but bp is now 120/70 and he is not sure he should take it

## 2019-11-29 NOTE — Interval H&P Note (Signed)
History and Physical Interval Note:  11/29/2019 2:03 PM  Robert Daniel  has presented today for surgery, with the diagnosis of Cardioversion   Afib.  The various methods of treatment have been discussed with the patient and family. After consideration of risks, benefits and other options for treatment, the patient has consented to  Procedure(s): CARDIOVERSION (N/A) as a surgical intervention.  The patient's history has been reviewed, patient examined, no change in status, stable for surgery.  I have reviewed the patient's chart and labs.  Questions were answered to the patient's satisfaction.     Ida Rogue

## 2019-11-29 NOTE — Anesthesia Procedure Notes (Signed)
Date/Time: 11/29/2019 7:33 AM Performed by: Jerrye Noble, CRNA Pre-anesthesia Checklist: Patient identified, Emergency Drugs available, Suction available and Patient being monitored Oxygen Delivery Method: Nasal cannula

## 2019-11-29 NOTE — H&P (Signed)
H&P Addendum, pre-cardioversion  Patient was seen and evaluated prior to -cardioversion procedure Symptoms, prior testing details again confirmed with the patient Patient examined, no significant change from prior exam Lab work reviewed in detail personally by myself Patient understands risk and benefit of the procedure, willing to proceed  Signed, Tim Roxanne Orner, MD, Ph.D [facility]  

## 2019-11-29 NOTE — Telephone Encounter (Signed)
I called and spoke with the patient. He had a DCCV this morning with Dr. Rockey Situ. He advised it took 3 shocks to restore him to NSR. He called to report his BP is no 120/70. He was not sure if he should take the increased torsemide as prescribed by Marrianne Mood, PA as of yesterday: INCREASE Torsemide to 40 mg by mouth every other day for 3 doses, then return to 20 mg by mouth every other day.   I confirmed with the patient he did not take torsemide this morning. He was going to start his trial of increased torsemide tomorrow. I advised him he should still be ok to to this even if his SBP is still in the 120's.  The patient voices understanding and is agreeable.

## 2019-11-29 NOTE — CV Procedure (Signed)
Cardioversion procedure note For atrial fibrillation, persistent  Procedure Details:  Consent: Risks of procedure as well as the alternatives and risks of each were explained to the (patient/caregiver). Consent for procedure obtained.  Time Out: Verified patient identification, verified procedure, site/side was marked, verified correct patient position, special equipment/implants available, medications/allergies/relevent history reviewed, required imaging and test results available. Performed  Patient placed on cardiac monitor, pulse oximetry, supplemental oxygen as necessary.  Sedation given: propofol IV, Dr. Ronelle Nigh Pacer pads placed anterior and posterior chest.   Cardioverted 3 time(s).  Cardioverted at  150J, 200J, 200J. Synchronized biphasic Converted to NSR   Evaluation: Findings: Post procedure EKG shows: NSR Complications: None Patient did tolerate procedure well.  Time Spent Directly with the Patient:  49 minutes   Esmond Plants, M.D., Ph.D.

## 2019-11-29 NOTE — Telephone Encounter (Signed)
-----   Message from Emily Filbert, RN sent at 11/29/2019 12:28 PM EDT ----- This patient called cardiology today for a follow up question post cardioversion this morning.  He mentioned he had a sleep consult appt scheduled with you guys on Monday that he had originally cancelled, but wanted to r/s for Monday if possible or a least get another appt date.   Can someone please reach out to him to reschedule.   Thank you!

## 2019-11-29 NOTE — Anesthesia Preprocedure Evaluation (Signed)
Anesthesia Evaluation  Patient identified by MRN, date of birth, ID band Patient awake    Reviewed: Allergy & Precautions, NPO status , Patient's Chart, lab work & pertinent test results  History of Anesthesia Complications Negative for: history of anesthetic complications  Airway Mallampati: III       Dental   Pulmonary neg sleep apnea, neg COPD, Not current smoker,           Cardiovascular hypertension, Pt. on medications      Neuro/Psych neg Seizures    GI/Hepatic Neg liver ROS, neg GERD  ,  Endo/Other  diabetes, Type 2, Oral Hypoglycemic Agents  Renal/GU Renal InsufficiencyRenal disease     Musculoskeletal   Abdominal   Peds  Hematology   Anesthesia Other Findings   Reproductive/Obstetrics                             Anesthesia Physical Anesthesia Plan  ASA: III  Anesthesia Plan: General   Post-op Pain Management:    Induction: Intravenous  PONV Risk Score and Plan: Propofol infusion and TIVA  Airway Management Planned: Nasal Cannula  Additional Equipment:   Intra-op Plan:   Post-operative Plan:   Informed Consent: I have reviewed the patients History and Physical, chart, labs and discussed the procedure including the risks, benefits and alternatives for the proposed anesthesia with the patient or authorized representative who has indicated his/her understanding and acceptance.       Plan Discussed with:   Anesthesia Plan Comments:         Anesthesia Quick Evaluation

## 2019-11-29 NOTE — Anesthesia Postprocedure Evaluation (Signed)
Anesthesia Post Note  Patient: Robert Daniel  Procedure(s) Performed: CARDIOVERSION (N/A )  Patient location during evaluation: Specials Recovery Anesthesia Type: General Level of consciousness: awake and alert Pain management: pain level controlled Vital Signs Assessment: post-procedure vital signs reviewed and stable Respiratory status: spontaneous breathing and respiratory function stable Cardiovascular status: stable Anesthetic complications: no   No complications documented.   Last Vitals:  Vitals:   11/29/19 0745 11/29/19 0800  BP: (!) 105/54 117/62  Pulse: 63 (!) 58  Resp: 19 18  Temp:    SpO2: 93% 97%    Last Pain:  Vitals:   11/29/19 0800  TempSrc:   PainSc: 0-No pain                 Katia Hannen K

## 2019-11-29 NOTE — Telephone Encounter (Signed)
Patient has been scheduled for sleep consult on 12/04/2019 with Dr. Mortimer Fries. Patient is aware and voiced his understanding.  Nothing further needed.

## 2019-11-29 NOTE — Transfer of Care (Signed)
Immediate Anesthesia Transfer of Care Note  Patient: Kyshaun Barnette  Procedure(s) Performed: CARDIOVERSION (N/A )  Patient Location: PACU and Endoscopy Unit  Anesthesia Type:General  Level of Consciousness: drowsy  Airway & Oxygen Therapy: Patient Spontanous Breathing and Patient connected to nasal cannula oxygen  Post-op Assessment: Report given to RN and Post -op Vital signs reviewed and stable  Post vital signs: Reviewed and stable  Last Vitals:  Vitals Value Taken Time  BP 115/52 11/29/19 0742  Temp    Pulse 60 11/29/19 0744  Resp 20 11/29/19 0744  SpO2 93 % 11/29/19 0744    Last Pain:  Vitals:   11/29/19 0652  TempSrc: Oral         Complications: No complications documented.

## 2019-12-02 ENCOUNTER — Institutional Professional Consult (permissible substitution): Payer: PPO | Admitting: Primary Care
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
## 2019-12-04 ENCOUNTER — Ambulatory Visit: Payer: PPO | Admitting: Internal Medicine

## 2019-12-04 ENCOUNTER — Other Ambulatory Visit
Admission: RE | Admit: 2019-12-04 | Discharge: 2019-12-04 | Disposition: A | Discharge status: Home / Self Care | Payer: PPO | Source: Ambulatory Visit | Attending: Physician Assistant | Admitting: Physician Assistant

## 2019-12-04 ENCOUNTER — Other Ambulatory Visit: Payer: Self-pay

## 2019-12-04 ENCOUNTER — Encounter: Payer: Self-pay | Admitting: Internal Medicine

## 2019-12-04 VITALS — BP 170/88 | HR 79 | Temp 97.8°F | Ht 75.0 in | Wt 288.2 lb

## 2019-12-04 DIAGNOSIS — I4819 Other persistent atrial fibrillation: Secondary | ICD-10-CM | POA: Diagnosis not present

## 2019-12-04 DIAGNOSIS — E1169 Type 2 diabetes mellitus with other specified complication: Secondary | ICD-10-CM

## 2019-12-04 DIAGNOSIS — E785 Hyperlipidemia, unspecified: Secondary | ICD-10-CM | POA: Insufficient documentation

## 2019-12-04 DIAGNOSIS — Z79899 Other long term (current) drug therapy: Secondary | ICD-10-CM | POA: Diagnosis not present

## 2019-12-04 DIAGNOSIS — G4719 Other hypersomnia: Secondary | ICD-10-CM

## 2019-12-04 LAB — BASIC METABOLIC PANEL
Anion gap: 12 (ref 5–15)
BUN: 20 mg/dL (ref 8–23)
CO2: 23 mmol/L (ref 22–32)
Calcium: 9.6 mg/dL (ref 8.9–10.3)
Chloride: 104 mmol/L (ref 98–111)
Creatinine, Ser: 1.23 mg/dL (ref 0.61–1.24)
GFR calc Af Amer: >60 mL/min (ref >60)
GFR calc non Af Amer: 55 mL/min — ABNORMAL LOW (ref >60)
Glucose, Bld: 192 mg/dL — ABNORMAL HIGH (ref 70–99)
Potassium: 3.9 mmol/L (ref 3.5–5.1)
Sodium: 139 mmol/L (ref 135–145)

## 2019-12-04 LAB — HEPATIC FUNCTION PANEL
ALT: 19 U/L (ref 0–44)
AST: 20 U/L (ref 15–41)
Albumin: 4.3 g/dL (ref 3.5–5.0)
Alkaline Phosphatase: 31 U/L — ABNORMAL LOW (ref 38–126)
Bilirubin, Direct: <0.1 mg/dL (ref 0.0–0.2)
Total Bilirubin: 0.7 mg/dL (ref 0.3–1.2)
Total Protein: 7.5 g/dL (ref 6.5–8.1)

## 2019-12-04 NOTE — Patient Instructions (Signed)
Obtain SPLIT NIGHT SLEEP STUDY

## 2019-12-04 NOTE — Progress Notes (Signed)
Name: Cristal Howatt MRN: [id number] DOB: 09/01/1940     CONSULTATION DATE:  REFERRING MD :   CHIEF COMPLAINT: excessive daytime sleepiness  HISTORY OF PRESENT ILLNESS: 79 year old gentleman with past medical history of DM2, HBA1C 7.0 Hyperlipidemia Cardiac cath 2013 Nonsmoker,Anxiety Chronic shortness of breath on exertion +Labile hypertension  Patient with abnormal rhythm status post electrical cardioversion last week Associated with longstanding hypertension  Patient is seen today for problems and issues with sleep related to excessive daytime sleepiness Patient  has been having sleep problems for many years Patient has been having extreme fatigue and tiredness, lack of energy + snoring every night   Discussed sleep data and reviewed with patient.  Encouraged proper weight management.  Discussed driving precautions and its relationship with hypersomnolence.  Discussed operating dangerous equipment and its relationship with hypersomnolence.  Discussed sleep hygiene, and benefits of a fixed sleep waked time.  The importance of getting eight or more hours of sleep discussed with patient.  Discussed limiting the use of the computer and television before bedtime.  Decrease naps during the day, so night time sleep will become enhanced.  Limit caffeine, and sleep deprivation.  HTN, stroke, and heart failure are potential risk factors.    EPWORTH SLEEP SCORE 3    PAST MEDICAL HISTORY :   has a past medical history of Hyperlipidemia (02/16/2017) and Hypertension.  has a past surgical history that includes Cardiac catheterization (2013); Cataract extraction (Bilateral, 04/21/2008); and Cardioversion (N/A, 11/29/2019). Prior to Admission medications   Medication Sig Start Date End Date Taking? Authorizing Provider  acetaminophen (TYLENOL) 500 MG tablet Take 500 mg by mouth every 6 (six) hours as needed.    [provider]  Ascorbic Acid (VITAMIN C) 1000 MG tablet  Take 1,000 mg by mouth daily.    [provider]  aspirin EC 81 MG tablet Take 81 mg by mouth daily.    [provider]  atorvastatin (LIPITOR) 80 MG tablet Take 1 tablet (80 mg total) by mouth daily. 10/01/19 12/30/19  Marrianne Mood D, PA-C  benazepril (LOTENSIN) 40 MG tablet Take 1 tablet by mouth once daily 11/25/19   Loel Dubonnet, NP  Cinnamon 500 MG TABS Take 1,000 mg by mouth daily.    [provider]  cloNIDine (CATAPRES) 0.2 MG tablet Take 1 tablet by mouth 4 times daily 11/23/19   Parks Ranger, Devonne Doughty, DO  co-enzyme Q-10 30 MG capsule Take 30 mg by mouth 3 (three) times daily.    [provider]  felodipine (PLENDIL) 10 MG 24 hr tablet Take 1 tablet (10 mg total) by mouth daily. 03/12/19   Minna Merritts, MD  gemfibrozil (LOPID) 600 MG tablet TAKE 1 TABLET BY MOUTH TWICE DAILY BEFORE A MEAL 07/30/19   Karamalegos, Devonne Doughty, DO  glimepiride (AMARYL) 4 MG tablet Take 1 tablet by mouth once daily with breakfast 09/15/19   Karamalegos, Devonne Doughty, DO  hydrALAZINE (APRESOLINE) 100 MG tablet Take 1 tablet (100 mg total) by mouth in the morning, at noon, in the evening, and at bedtime. 04/02/19   Loel Dubonnet, NP  ibuprofen (ADVIL,MOTRIN) 200 MG tablet Take 800 mg by mouth every 6 (six) hours as needed.    [provider]  isosorbide mononitrate (IMDUR) 60 MG 24 hr tablet Take 1 tablet by mouth twice daily 11/25/19   Loel Dubonnet, NP  labetalol (NORMODYNE) 100 MG tablet Take 1 tablet (100 mg total) by mouth 2 (two) times daily. Take 1 tablet (  100 mg total) by mouth 2 (two) times daily. May take an additional half tablet as needed for systolic blood pressure >390. 05/09/19   Loel Dubonnet, NP  metFORMIN (GLUCOPHAGE) 1000 MG tablet TAKE 1 TABLET BY MOUTH TWICE DAILY WITH A MEAL 01/13/19   Karamalegos, Devonne Doughty, DO  Multiple Vitamin (MULTIVITAMIN) tablet Take 1 tablet by mouth daily.    [provider]  nitroGLYCERIN  (NITROSTAT) 0.4 MG SL tablet Place 1 tablet (0.4 mg total) under the tongue every 5 (five) minutes as needed for chest pain. Patient not taking: Reported on 11/29/2019 02/17/17 04/01/28  Minna Merritts, MD  Omega-3 Fatty Acids (OMEGA-3 FISH OIL) 1200 MG CAPS Take 2,000 mg by mouth 2 (two) times daily with a meal.    [provider]  rivaroxaban (XARELTO) 20 MG TABS tablet Take 1 tablet (20 mg total) by mouth daily with supper. 10/17/19   Minna Merritts, MD  torsemide (DEMADEX) 20 MG tablet Take 1 tablet (20 mg total) by mouth every other day. 10/10/19 01/08/20  Marrianne Mood D, PA-C  Turmeric 500 MG TABS Take by mouth daily.     [provider]  zaleplon (SONATA) 5 MG capsule Take 1 capsule (5 mg total) by mouth at bedtime as needed for sleep. 07/24/19   Karamalegos, Devonne Doughty, DO   No Known Allergies  FAMILY HISTORY:  family history is not on file. SOCIAL HISTORY:  reports that he has never smoked. He has never used smokeless tobacco. He reports that he does not drink alcohol and does not use drugs.   Review of Systems:  Gen:  Denies  fever, sweats, chills weight loss  HEENT: Denies blurred vision, double vision, ear pain, eye pain, hearing loss, nose bleeds, sore throat Cardiac:  No dizziness, chest pain or heaviness, chest tightness,edema, No JVD Resp:   No cough, -sputum production, -shortness of breath,-wheezing, -hemoptysis,  Gi: Denies swallowing difficulty, stomach pain, nausea or vomiting, diarrhea, constipation, bowel incontinence Gu:  Denies bladder incontinence, burning urine Ext:   Denies Joint pain, stiffness or swelling Skin: Denies  skin rash, easy bruising or bleeding or hives Endoc:  Denies polyuria, polydipsia , polyphagia or weight change Psych:   Denies depression, insomnia or hallucinations  Other:  All other systems negative   ALL OTHER ROS ARE NEGATIVE  BP (!) 170/88 (BP Location: Left Wrist, Patient Position: Sitting, Cuff Size: Normal)    Pulse 79   Temp 97.8 F (36.6 C) (Temporal)   Ht 6\' 3"  (1.905 m)   Wt 288 lb 3.2 oz (130.7 kg)   SpO2 96%   BMI 36.02 kg/m    Physical Examination:   General Appearance: No distress  Neuro:without focal findings,  speech normal,  HEENT: PERRLA, EOM intact.   Pulmonary: normal breath sounds, No wheezing.  CardiovascularNormal S1,S2.  No m/r/g.   Abdomen: Benign, Soft, non-tender. Renal:  No costovertebral tenderness  GU:  Not performed at this time. Endoc: No evident thyromegaly Skin:   warm, no rashes, no ecchymosis  Extremities: normal, no cyanosis, clubbing. PSYCHIATRIC: Mood, affect within normal limits.   ALL OTHER ROS ARE NEGATIVE     CULTURE RESULTS   Recent Results (from the past 240 hour(s))  SARS CORONAVIRUS 2 (TAT 6-24 HRS) Nasopharyngeal Nasopharyngeal Swab     Status: None   Collection Time: 11/26/19  1:36 PM   Specimen: Nasopharyngeal Swab  Result Value Ref Range Status   SARS Coronavirus 2 NEGATIVE NEGATIVE Final    Comment: (  NOTE) SARS-CoV-2 target nucleic acids are NOT DETECTED.  The SARS-CoV-2 RNA is generally detectable in upper and lower respiratory specimens during the acute phase of infection. Negative results do not preclude SARS-CoV-2 infection, do not rule out co-infections with other pathogens, and should not be used as the sole basis for treatment or other patient management decisions. Negative results must be combined with clinical observations, patient history, and epidemiological information. The expected result is Negative.  Fact Sheet for Patients: SugarRoll.be  Fact Sheet for Healthcare Providers: https://www.woods-mathews.com/  This test is not yet approved or cleared by the [place] FDA and  has been authorized for detection and/or diagnosis of SARS-CoV-2 by FDA under an Emergency Use Authorization (EUA). This EUA will remain  in effect (meaning this test can be used) for the  duration of the COVID-19 declaration under Se ction 564(b)(1) of the Act, 21 U.S.C. section 360bbb-3(b)(1), unless the authorization is terminated or revoked sooner.  Performed at [facility], [place] [address], [place], [place] [address]           ASSESSMENT AND PLAN SYNOPSIS  79 year old pleasant white male seen today for longstanding hypertension in the setting of status post electrical cardioversion with cardiac arrhythmias with evidence of excessive daytime sleepiness associated with snoring findings may suggest underlying obstructive sleep apnea  Recommend obtaining split-night sleep study  Obesity -recommend significant weight loss -recommend changing diet  Deconditioned state -Recommend increased daily activity and exercise       COVID-19 EDUCATION: The signs and symptoms of COVID-19 were discussed with the patient and how to seek care for testing.  The importance of social distancing was discussed today. Hand Washing Techniques and avoid touching face was advised.  MEDICATION ADJUSTMENTS/LABS AND TESTS ORDERED:  CURRENT MEDICATIONS REVIEWED AT LENGTH WITH PATIENT TODAY Obtain Split night sleep study  Patient satisfied with Plan of action and management. All questions answered  Follow up in 3 months   Cortez Steelman Patricia Pesa, M.D.  [facility]  Medical Director [place] Director [facility] Cardio-Pulmonary Department

## 2019-12-12 ENCOUNTER — Encounter: Payer: Self-pay | Admitting: Physician Assistant

## 2019-12-12 ENCOUNTER — Other Ambulatory Visit: Payer: Self-pay

## 2019-12-12 ENCOUNTER — Ambulatory Visit: Payer: PPO | Admitting: Physician Assistant

## 2019-12-12 VITALS — BP 150/82 | HR 74 | Ht 75.0 in | Wt 285.0 lb

## 2019-12-12 DIAGNOSIS — I4891 Unspecified atrial fibrillation: Secondary | ICD-10-CM

## 2019-12-12 DIAGNOSIS — Z79899 Other long term (current) drug therapy: Secondary | ICD-10-CM

## 2019-12-12 DIAGNOSIS — G4730 Sleep apnea, unspecified: Secondary | ICD-10-CM

## 2019-12-12 DIAGNOSIS — E1169 Type 2 diabetes mellitus with other specified complication: Secondary | ICD-10-CM

## 2019-12-12 DIAGNOSIS — I5032 Chronic diastolic (congestive) heart failure: Secondary | ICD-10-CM

## 2019-12-12 DIAGNOSIS — E785 Hyperlipidemia, unspecified: Secondary | ICD-10-CM

## 2019-12-12 NOTE — Patient Instructions (Addendum)
Make sure to check Blood Pressures 2 hours after your medications.    Please keep a log of your readings so we can see how they trend. Send those readings to [place] in My Chart and I will have provider review.   Avoid these things for 30 minutes before checking your blood pressure:  Drinking caffeine.  Drinking alcohol.  Eating.  Smoking.  Exercising.  Five minutes before checking your blood pressure:  Pee.  Sit in a dining chair. Avoid sitting in a soft couch or armchair.  Be quiet. Do not talk.    Medication Instructions:  No changes  *If you need a refill on your cardiac medications before your next appointment, please call your pharmacy*   Lab Work: None  If you have labs (blood work) drawn today and your tests are completely normal, you will receive your results only by: Marland Kitchen MyChart Message (if you have MyChart) OR . A paper copy in the mail If you have any lab test that is abnormal or we need to change your treatment, we will call you to review the results.   Testing/Procedures: None   Follow-Up: At [facility], you and your health needs are our priority.  As part of our continuing mission to provide you with exceptional heart care, we have created designated Provider Care Teams.  These Care Teams include your primary Cardiologist (physician) and Advanced Practice Providers (APPs -  Physician Assistants and Nurse Practitioners) who all work together to provide you with the care you need, when you need it.    Your next appointment:   6 month(s)  The format for your next appointment:   In Person  Provider:   You may see Ida Rogue, MD . or one of the following Advanced Practice Providers on your designated Care Team:    Murray Hodgkins, NP  Christell Faith, PA-C  Marrianne Mood, PA-C  Cadence Kathlen Mody, [place]   How to Take Your Blood Pressure You can take your blood pressure at home with a machine. You may need to check your blood pressure at  home:  To check if you have high blood pressure (hypertension).  To check your blood pressure over time.  To make sure your blood pressure medicine is working. Supplies needed: You will need a blood pressure machine, or monitor. You can buy one at a drugstore or online. When choosing one:  Choose one with an arm cuff.  Choose one that wraps around your upper arm. Only one finger should fit between your arm and the cuff.  Do not choose one that measures your blood pressure from your wrist or finger. Your doctor can suggest a monitor. How to prepare Avoid these things for 30 minutes before checking your blood pressure:  Drinking caffeine.  Drinking alcohol.  Eating.  Smoking.  Exercising. Five minutes before checking your blood pressure:  Pee.  Sit in a dining chair. Avoid sitting in a soft couch or armchair.  Be quiet. Do not talk. How to take your blood pressure Follow the instructions that came with your machine. If you have a digital blood pressure monitor, these may be the instructions: 1. Sit up straight. 2. Place your feet on the floor. Do not cross your ankles or legs. 3. Rest your left arm at the level of your heart. You may rest it on a table, desk, or chair. 4. Pull up your shirt sleeve. 5. Wrap the blood pressure cuff around the upper part of your left arm. The cuff  should be 1 inch (2.5 cm) above your elbow. It is best to wrap the cuff around bare skin. 6. Fit the cuff snugly around your arm. You should be able to place only one finger between the cuff and your arm. 7. Put the cord inside the groove of your elbow. 8. Press the power button. 9. Sit quietly while the cuff fills with air and loses air. 10. Write down the numbers on the screen. 11. Wait 2-3 minutes and then repeat steps 1-10. What do the numbers mean? Two numbers make up your blood pressure. The first number is called systolic pressure. The second is called diastolic pressure. An example of a  blood pressure reading is "120 over 80" (or 120/80). If you are an adult and do not have a medical condition, use this guide to find out if your blood pressure is normal: Normal  First number: below 120.  Second number: below 80. Elevated  First number: 120-129.  Second number: below 80. Hypertension stage 1  First number: 130-139.  Second number: 80-89. Hypertension stage 2  First number: 140 or above.  Second number: 29 or above. Your blood pressure is above normal even if only the top or bottom number is above normal. Follow these instructions at home:  Check your blood pressure as often as your doctor tells you to.  Take your monitor to your next doctor's appointment. Your doctor will: ? Make sure you are using it correctly. ? Make sure it is working right.  Make sure you understand what your blood pressure numbers should be.  Tell your doctor if your medicines are causing side effects. Contact a doctor if:  Your blood pressure keeps being high. Get help right away if:  Your first blood pressure number is higher than 180.  Your second blood pressure number is higher than 120. This information is not intended to replace advice given to you by your health care provider. Make sure you discuss any questions you have with your health care provider. Document Revised: 02/03/2017 Document Reviewed: 07/31/2015 Elsevier Patient Education  2020 [employer].

## 2019-12-15 ENCOUNTER — Other Ambulatory Visit: Payer: Self-pay | Admitting: Family Medicine

## 2019-12-15 DIAGNOSIS — E1121 Type 2 diabetes mellitus with diabetic nephropathy: Secondary | ICD-10-CM

## 2019-12-15 NOTE — Telephone Encounter (Signed)
Requested Prescriptions  Pending Prescriptions Disp Refills  . glimepiride (AMARYL) 4 MG tablet [Pharmacy Med Name: Glimepiride 4 MG Oral Tablet] 90 tablet 0    Sig: Take 1 tablet by mouth once daily with breakfast     Endocrinology:  Diabetes - Sulfonylureas Failed - 12/15/2019  7:56 AM      Failed - HBA1C is between 0 and 7.9 and within 180 days    Hemoglobin A1C  Date Value Ref Range Status  08/23/2016 7.6  Final   Hgb A1c MFr Bld  Date Value Ref Range Status  07/16/2019 8.3 (H) <5.7 % of total Hgb Final    Comment:    For someone without known diabetes, a hemoglobin A1c value of 6.5% or greater indicates that they may have  diabetes and this should be confirmed with a follow-up  test. . For someone with known diabetes, a value <7% indicates  that their diabetes is well controlled and a value  greater than or equal to 7% indicates suboptimal  control. A1c targets should be individualized based on  duration of diabetes, age, comorbid conditions, and  other considerations. . Currently, no consensus exists regarding use of hemoglobin A1c for diagnosis of diabetes for children. Renella Cunas - Valid encounter within last 6 months    Recent Outpatient Visits          4 months ago Annual physical exam   [place], DO   5 months ago Dyspnea on exertion   [place], DO   1 year ago Annual physical exam   [facility] Olin Hauser, DO   1 year ago Type 2 diabetes mellitus with nephropathy [facility])   [facility] Olin Hauser, DO   1 year ago Type 2 diabetes mellitus with nephropathy [facility])   [facility] Olin Hauser, DO      Future Appointments            In 1 month Parks Ranger, Devonne Doughty, DO [facility]

## 2019-12-17 ENCOUNTER — Other Ambulatory Visit: Payer: Self-pay

## 2019-12-17 ENCOUNTER — Other Ambulatory Visit: Payer: Self-pay | Admitting: *Deleted

## 2019-12-17 ENCOUNTER — Other Ambulatory Visit: Payer: Self-pay | Admitting: Family Medicine

## 2019-12-17 DIAGNOSIS — E1121 Type 2 diabetes mellitus with diabetic nephropathy: Secondary | ICD-10-CM

## 2019-12-17 MED ORDER — ATORVASTATIN CALCIUM 80 MG PO TABS: 80.0000 mg | ORAL_TABLET | Freq: Every day | ORAL | 6 refills | Status: DC

## 2019-12-18 ENCOUNTER — Other Ambulatory Visit: Payer: Self-pay | Admitting: Family

## 2019-12-18 DIAGNOSIS — I10 Essential (primary) hypertension: Secondary | ICD-10-CM

## 2019-12-19 ENCOUNTER — Other Ambulatory Visit: Payer: Self-pay | Admitting: Cardiovascular Disease

## 2019-12-19 MED ORDER — ATORVASTATIN CALCIUM 80 MG PO TABS: 80.0000 mg | ORAL_TABLET | Freq: Every day | ORAL | 1 refills | Status: AC

## 2019-12-19 NOTE — Telephone Encounter (Signed)
Requested Prescriptions   Signed Prescriptions Disp Refills   atorvastatin (LIPITOR) 80 MG tablet 90 tablet 1    Sig: Take 1 tablet (80 mg total) by mouth daily.    Authorizing Provider: Marrianne Mood D    Ordering User: Raelene Bott, Ligaya Cormier L

## 2019-12-19 NOTE — Telephone Encounter (Signed)
Patient states [facility] did not have yesterday - please resend   *STAT* If patient is at the pharmacy, call can be transferred to refill team.   1. Which medications need to be refilled? (please list name of each medication and dose if known) atorvastatin (LIPITOR) 80 MG - 1 tablet daily  2. Which pharmacy/location (including street and city if local pharmacy) is medication to be sent to? [facility] on [address]  3. Do they need a 30 day or 90 day supply? 90 day

## 2019-12-26 ENCOUNTER — Ambulatory Visit: Payer: PPO | Attending: Pulmonary Disease

## 2019-12-26 DIAGNOSIS — R5383 Other fatigue: Secondary | ICD-10-CM | POA: Diagnosis present

## 2019-12-26 DIAGNOSIS — G478 Other sleep disorders: Secondary | ICD-10-CM | POA: Insufficient documentation

## 2019-12-27 ENCOUNTER — Other Ambulatory Visit: Payer: Self-pay

## 2019-12-30 ENCOUNTER — Other Ambulatory Visit: Payer: Self-pay | Admitting: Cardiovascular Disease

## 2019-12-31 NOTE — Telephone Encounter (Signed)
Rx request sent to pharmacy.  

## 2020-01-09 ENCOUNTER — Other Ambulatory Visit: Payer: Self-pay | Admitting: Family Medicine

## 2020-01-09 DIAGNOSIS — E1121 Type 2 diabetes mellitus with diabetic nephropathy: Secondary | ICD-10-CM

## 2020-01-10 ENCOUNTER — Telehealth (INDEPENDENT_AMBULATORY_CARE_PROVIDER_SITE_OTHER): Payer: PPO | Admitting: Pulmonary Disease

## 2020-01-10 DIAGNOSIS — G478 Other sleep disorders: Secondary | ICD-10-CM | POA: Diagnosis not present

## 2020-01-10 NOTE — Telephone Encounter (Signed)
No sig OSA Mostly RERAs noted. Suggest ,wt loss as the only Rx option unless very symptomatic

## 2020-01-13 NOTE — Telephone Encounter (Signed)
Thank you Davonna Belling

## 2020-01-21 DIAGNOSIS — E113393 Type 2 diabetes mellitus with moderate nonproliferative diabetic retinopathy without macular edema, bilateral: Secondary | ICD-10-CM | POA: Diagnosis not present

## 2020-01-21 LAB — HM DIABETES EYE EXAM

## 2020-01-22 ENCOUNTER — Encounter: Payer: Self-pay | Admitting: Family Medicine

## 2020-01-27 ENCOUNTER — Ambulatory Visit (INDEPENDENT_AMBULATORY_CARE_PROVIDER_SITE_OTHER): Payer: PPO | Admitting: Primary Care

## 2020-01-27 ENCOUNTER — Encounter: Payer: Self-pay | Admitting: Primary Care

## 2020-01-27 ENCOUNTER — Other Ambulatory Visit: Payer: Self-pay

## 2020-01-27 DIAGNOSIS — R5383 Other fatigue: Secondary | ICD-10-CM | POA: Diagnosis not present

## 2020-01-27 NOTE — Progress Notes (Signed)
@Patient  ID: Robert Daniel, male    DOB: [birth year], 79 y.o.   MRN: [id number]  Chief Complaint  Patient presents with  . Follow-up    discuss sleep study.     Referring provider: Nobie Putnam *  CHIEF COMPLAINT: excessive daytime sleepiness  HISTORY OF PRESENT ILLNESS: 79 year old gentleman with past medical history of DM2, HBA1C 7.0 Hyperlipidemia Cardiac cath 2013 Nonsmoker,Anxiety Chronic shortness of breath on exertion +Labile hypertension  Patient with abnormal rhythm status post electrical cardioversion last week Associated with longstanding hypertension  Patient is seen today for problems and issues with sleep related to excessive daytime sleepiness Patient  has been having sleep problems for many years Patient has been having extreme fatigue and tiredness, lack of energy + snoring every night  Discussed sleep data and reviewed with patient.  Encouraged proper weight management.  Discussed driving precautions and its relationship with hypersomnolence.  Discussed operating dangerous equipment and its relationship with hypersomnolence.  Discussed sleep hygiene, and benefits of a fixed sleep waked time.  The importance of getting eight or more hours of sleep discussed with patient.  Discussed limiting the use of the computer and television before bedtime.  Decrease naps during the day, so night time sleep will become enhanced.  Limit caffeine, and sleep deprivation.  HTN, stroke, and heart failure are potential risk factors.   EPWORTH SLEEP SCORE 3   01/27/2020- interim hx Patient presents today to review sleep study results. He saw Dr. Mortimer Fries for initial sleep consult d/t daytime fatigue. He is in a bit of a rush today d/t back pain. He politely declined vital signs and physical exam. Sleep study showed no evidence of OSA or oxygen desaturation. He has not episodes of snoring or periotic limb movements.     No Known Allergies  Immunization History    Administered Date(s) Administered  . Influenza, High Dose Seasonal PF 11/01/2017, 10/24/2018  . Influenza-Unspecified 10/28/2015, 11/01/2017, 10/24/2018, 10/31/2019  . PFIZER SARS-COV-2 Vaccination 03/14/2019, 04/04/2019, 11/30/2019  . Pneumococcal Conjugate-13 12/14/2013  . Pneumococcal-Unspecified 10/08/2012, 10/28/2015  . Td 10/27/2010  . Zoster Recombinat (Shingrix) 10/08/2012, 12/04/2018, 02/02/2019    Past Medical History:  Diagnosis Date  . Hyperlipidemia 02/16/2017  . Hypertension     Tobacco History: Social History   Tobacco Use  Smoking Status Never Smoker  Smokeless Tobacco Never Used   Counseling given: Not Answered   Outpatient Medications Prior to Visit  Medication Sig Dispense Refill  . acetaminophen (TYLENOL) 500 MG tablet Take 500 mg by mouth every 6 (six) hours as needed.    . Ascorbic Acid (VITAMIN C) 1000 MG tablet Take 1,000 mg by mouth daily.    Marland Kitchen aspirin EC 81 MG tablet Take 81 mg by mouth daily.    Marland Kitchen atorvastatin (LIPITOR) 80 MG tablet Take 1 tablet (80 mg total) by mouth daily. 90 tablet 1  . benazepril (LOTENSIN) 40 MG tablet Take 1 tablet by mouth once daily 90 tablet 3  . Cinnamon 500 MG TABS Take 1,000 mg by mouth daily.    . cloNIDine (CATAPRES) 0.2 MG tablet Take 1 tablet by mouth 4 times daily 360 tablet 0  . co-enzyme Q-10 30 MG capsule Take 30 mg by mouth 3 (three) times daily.    . felodipine (PLENDIL) 10 MG 24 hr tablet Take 1 tablet (10 mg total) by mouth daily. 90 tablet 3  . gemfibrozil (LOPID) 600 MG tablet TAKE 1 TABLET BY MOUTH TWICE DAILY BEFORE A MEAL 180 tablet 3  .  glimepiride (AMARYL) 4 MG tablet Take 1 tablet by mouth once daily with breakfast 90 tablet 0  . hydrALAZINE (APRESOLINE) 100 MG tablet TAKE 1 TABLET BY MOUTH IN THE MORNING, AT NOON, IN THE EVENING, AND AT BEDTIME 360 tablet 0  . ibuprofen (ADVIL,MOTRIN) 200 MG tablet Take 800 mg by mouth every 6 (six) hours as needed.    . isosorbide mononitrate (IMDUR) 60 MG 24 hr  tablet Take 1 tablet by mouth twice daily 180 tablet 0  . labetalol (NORMODYNE) 100 MG tablet Take 1 tablet (100 mg total) by mouth 2 (two) times daily. Take 1 tablet (100 mg total) by mouth 2 (two) times daily. May take an additional half tablet as needed for systolic blood pressure >580. 180 tablet 3  . metFORMIN (GLUCOPHAGE) 1000 MG tablet TAKE 1 TABLET BY MOUTH TWICE DAILY WITH A MEAL 180 tablet 0  . Multiple Vitamin (MULTIVITAMIN) tablet Take 1 tablet by mouth daily.    . nitroGLYCERIN (NITROSTAT) 0.4 MG SL tablet DISSOLVE ONE TABLET UNDER THE TONGUE EVERY 5 MINUTES AS NEEDED FOR CHEST PAIN.  DO NOT EXCEED A TOTAL OF 3 DOSES IN 15 MINUTES 25 tablet 4  . Omega-3 Fatty Acids (OMEGA-3 FISH OIL) 1200 MG CAPS Take 2,000 mg by mouth 2 (two) times daily with a meal.    . rivaroxaban (XARELTO) 20 MG TABS tablet Take 1 tablet (20 mg total) by mouth daily with supper. 90 tablet 1  . Turmeric 500 MG TABS Take by mouth daily.     . zaleplon (SONATA) 5 MG capsule Take 1 capsule (5 mg total) by mouth at bedtime as needed for sleep. 20 capsule 2  . torsemide (DEMADEX) 20 MG tablet Take 1 tablet (20 mg total) by mouth every other day.     No facility-administered medications prior to visit.   Review of Systems  Review of Systems  Constitutional: Negative.   HENT: Negative.   Respiratory: Negative.   Musculoskeletal: Positive for back pain.   Physical Exam  There were no vitals taken for this visit. Physical Exam HENT:     Head: Normocephalic and atraumatic.  Cardiovascular:     Comments: Declined exam Pulmonary:     Comments: Declined exam Musculoskeletal:     Comments: In WC  Neurological:     General: No focal deficit present.     Mental Status: He is alert and oriented to person, place, and time. Mental status is at baseline.      Lab Results:  CBC    Component Value Date/Time   WBC 8.2 10/28/2019 1017   RBC 3.97 (L) 10/28/2019 1017   HGB 11.0 (L) 10/28/2019 1017   HGB 12.0 (L)  10/15/2019 1159   HCT 35.2 (L) 10/28/2019 1017   HCT 37.1 (L) 10/15/2019 1159   PLT 195 10/28/2019 1017   PLT 156 10/15/2019 1159   MCV 88.7 10/28/2019 1017   MCV 86 10/15/2019 1159   MCH 27.7 10/28/2019 1017   MCHC 31.3 10/28/2019 1017   RDW 13.8 10/28/2019 1017   RDW 13.6 10/15/2019 1159   LYMPHSABS 1.7 10/09/2019 1029   LYMPHSABS 1.2 10/01/2019 1251   MONOABS 0.6 10/09/2019 1029   EOSABS 0.2 10/09/2019 1029   EOSABS 0.1 10/01/2019 1251   BASOSABS 0.0 10/09/2019 1029   BASOSABS 0.1 10/01/2019 1251    BMET    Component Value Date/Time   NA 139 12/04/2019 1147   NA 146 (H) 10/15/2019 1159   NA 140 08/23/2016 0000  K 3.9 12/04/2019 1147   K 4.2 08/23/2016 0000   CL 104 12/04/2019 1147   CL 103 08/23/2016 0000   CO2 23 12/04/2019 1147   CO2 19 (A) 08/23/2016 0000   CO2 28 10/31/2011 0926   GLUCOSE 192 (H) 12/04/2019 1147   GLUCOSE 143 (H) 10/31/2011 0926   BUN 20 12/04/2019 1147   BUN 29 (H) 10/15/2019 1159   BUN 28 (H) 10/31/2011 0926   CREATININE 1.23 12/04/2019 1147   CREATININE 1.35 (H) 07/16/2019 0751   CALCIUM 9.6 12/04/2019 1147   CALCIUM 9.8 08/23/2016 0000   GFRNONAA 55 (L) 12/04/2019 1147   GFRNONAA 49 (L) 07/16/2019 0751   GFRAA >60 12/04/2019 1147   GFRAA 57 (L) 07/16/2019 0751    BNP No results found for: BNP  ProBNP No results found for: PROBNP  Imaging: SLEEP STUDY DOCUMENTS  Result Date: 01/23/2020 Ordered by an unspecified provider.    Assessment & Plan:   Fatigue - 12/26/19 PSG showed no evidence of OSA. Sleep efficiency was 60%, arousal inedx 20.2/hr. Total apnea-hyponea index 0.5/hr. SpO2 low 91%. No episodes of snoring or periodic leg movements were noted. No treatment recommended for this. He did wake up several times likely due to chronic pain which can be managed by PCP. Follow-up with pulmonary as needed.      Martyn Ehrich, NP 01/27/2020

## 2020-01-27 NOTE — Patient Instructions (Signed)
Sleep study showed no evidence of obstructive sleep apnea or oxygen desaturation No snoring or periodic leg movements. You did wake up several times likely due to pain   Follow-up As needed with pulmonary

## 2020-01-27 NOTE — Assessment & Plan Note (Signed)
-   12/26/19 PSG showed no evidence of OSA. Sleep efficiency was 60%, arousal inedx 20.2/hr. Total apnea-hyponea index 0.5/hr. SpO2 low 91%. No episodes of snoring or periodic leg movements were noted. No treatment recommended for this. He did wake up several times likely due to chronic pain which can be managed by PCP. Follow-up with pulmonary as needed.

## 2020-01-28 ENCOUNTER — Other Ambulatory Visit: Payer: Self-pay | Admitting: Family Medicine

## 2020-01-28 ENCOUNTER — Ambulatory Visit (INDEPENDENT_AMBULATORY_CARE_PROVIDER_SITE_OTHER): Payer: PPO | Admitting: Family Medicine

## 2020-01-28 ENCOUNTER — Encounter: Payer: Self-pay | Admitting: Family Medicine

## 2020-01-28 VITALS — BP 150/60 | HR 77 | Temp 97.5°F | Resp 16 | Ht 75.0 in | Wt 290.0 lb

## 2020-01-28 DIAGNOSIS — I10 Essential (primary) hypertension: Secondary | ICD-10-CM

## 2020-01-28 DIAGNOSIS — E1169 Type 2 diabetes mellitus with other specified complication: Secondary | ICD-10-CM | POA: Diagnosis not present

## 2020-01-28 DIAGNOSIS — E1121 Type 2 diabetes mellitus with diabetic nephropathy: Secondary | ICD-10-CM

## 2020-01-28 DIAGNOSIS — E785 Hyperlipidemia, unspecified: Secondary | ICD-10-CM | POA: Diagnosis not present

## 2020-01-28 DIAGNOSIS — Z0000 Encounter for general adult medical examination without abnormal findings: Secondary | ICD-10-CM

## 2020-01-28 DIAGNOSIS — R351 Nocturia: Secondary | ICD-10-CM

## 2020-01-28 LAB — POCT GLYCOSYLATED HEMOGLOBIN (HGB A1C): Hemoglobin A1C: 7.3 % — AB (ref 4.0–5.6)

## 2020-01-28 MED ORDER — GLIMEPIRIDE 4 MG PO TABS: 4.0000 mg | ORAL_TABLET | Freq: Every day | ORAL | 3 refills | Status: DC

## 2020-01-28 MED ORDER — METFORMIN HCL 1000 MG PO TABS: ORAL_TABLET | ORAL | 3 refills | Status: AC

## 2020-01-28 MED ORDER — CLONIDINE HCL 0.2 MG PO TABS: ORAL_TABLET | ORAL | 3 refills | Status: AC

## 2020-01-28 NOTE — Assessment & Plan Note (Addendum)
Last lab 07/2019 elevated Calculated ASCVD 10 yr risk score elevated risk w/ DM  Plan: 1. Continue current meds - Atorvastatin 80mg  daily, Gemfibrozil 600mg  BID, Fish Oil Omega 3 3. Continue ASA 81mg  for primary ASCVD risk reduction 4. Encourage improved lifestyle - low carb/cholesterol, reduce portion size, continue improving regular exercise

## 2020-01-28 NOTE — Patient Instructions (Addendum)
Thank you for coming to the office today.  Recent Labs    07/16/19 0751 01/28/20 1058  HGBA1C 8.3* 7.3*   Keep up the good work!  BP improved on re-check  Refilled some meds.  DUE for FASTING BLOOD WORK (no food or drink after midnight before the lab appointment, only water or coffee without cream/sugar on the morning of)  SCHEDULE "Lab Only" visit in the morning at the clinic for lab draw in 6 MONTHS   - Make sure Lab Only appointment is at about 1 week before your next appointment, so that results will be available  For Lab Results, once available within 2-3 days of blood draw, you can can log in to MyChart online to view your results and a brief explanation. Also, we can discuss results at next follow-up visit.    Please schedule a Follow-up Appointment to: Return in about 6 months (around 07/27/2020) for 6 month fasting lab only then 1 week later Annual Physical.  If you have any other questions or concerns, please feel free to call the office or send a message through [place]. You may also schedule an earlier appointment if necessary.  Additionally, you may be receiving a survey about your experience at our office within a few days to 1 week by e-mail or mail. We value your feedback.  Nobie Putnam, DO [place]

## 2020-01-28 NOTE — Assessment & Plan Note (Signed)
Improved to A1c 7.3  RESOLVED Hypoglycemia Complications - CKD-III in setting of HTN as well, and other including hyperlipidemia - increases risk of future cardiovascular complications Off Rybelsus?  Plan:  1. Continue Metformin 1000 BID, Glimepiride 4mg  daily - declined GLP1 injection. Defer Rybelsus oral GLP1 2. Encourage improved lifestyle - low carb, low sugar diet, reduce portion size, continue improving regular exercise 3. Check CBG, bring log to next visit for review 4. Continue ASA, ACEi, Statin 5. UTD DM Eye [place] 07/2019

## 2020-01-28 NOTE — Progress Notes (Signed)
Subjective:    Patient ID: Robert Daniel, male    DOB: [birth year], 79 y.o.   MRN: [id number]  Robert Daniel is a 79 y.o. male presenting on 01/28/2020 for Diabetes (question about medication)   HPI    [facility] Had recent sleep study, last visit yesterday 11/22 and advised that PSG on 10/21 showed no evidence for OSA.  RESISTANT HTN Atrial Fibrillation - on chronic anticoagulation S/p DCCV cardioversion 11/29/19 - has remained in Normal Sinus Rhythm, without racing heart rate or palpitations. Has improved his HTN. - Now on Xarelto per cardiology - Taking Torsemide every other day to help fluid and blood pressure.  Home BP readingsstillvariable systolic BP had been improved recently Followed by Cardiology Current Meds -Felodipine XL 10mg  daily,Benazepril 40mg  daily, Hydralazine 100mg  QID, Clonidine 0.2mg  QID, Imdur 60mg  BID - Tosemide 100mg  PRN with K - improves edema with this PRN takes occasionally about 1 x weekly or so, he wears compression as well with relief Reports good compliance, took meds today. Tolerating well, w/o complaints. Lifestyle: Weightgain again by our scale Denies chest pain or tightness actively or recently  CHRONIC DM, Type 2with Nephropathy CKD-III/ Obesity BMI >36 Last visit 07/2019, with improved A1c to 8.3, we continued meds and did not start new GLP1 medication. Weight gain in interval He has declined weekly injection GLP1 in past. Meds:Glimepiride4mg daily, Metformin 1000mg  BID Reports good compliance. Tolerating well w/o side-effects Currently on ACEi - No history of neuropathy or numbness tingling Diet - tries to do low carb diet, not always, admits some sweets - [facility] Admits rare hypoglycemia- now seems resolved Deniespolyuria, visual changes, numbness or tingling.  Primary insomnia Chronic problem. Off Zaleplon [place] no longer taking.  Supplements - Cinnamon and now  added Chromium, had significant episode of hypoglycemia. He started very high dose, he wants to try reduced dose.  UTD COVID19 vaccine.    Depression screen [facility] 2/9 01/28/2020 07/08/2019 02/19/2019  Decreased Interest 0 0 0  Down, Depressed, Hopeless 0 0 0  PHQ - 2 Score 0 0 0  Altered sleeping - - -  Tired, decreased energy - - -  Change in appetite - - -  Feeling bad or failure about yourself  - - -  Trouble concentrating - - -  Moving slowly or fidgety/restless - - -  Suicidal thoughts - - -  PHQ-9 Score - - -  Difficult doing work/chores - - -    Social History   Tobacco Use  . Smoking status: Never Smoker  . Smokeless tobacco: Never Used  Vaping Use  . Vaping Use: Never used  Substance Use Topics  . Alcohol use: No    Comment: wine occasionaly   . Drug use: No    Review of Systems  Constitutional: Negative for activity change, appetite change, chills, diaphoresis, fatigue and fever.  HENT: Negative for congestion and hearing loss.   Eyes: Negative for visual disturbance.  Respiratory: Negative for cough, chest tightness, shortness of breath and wheezing.   Cardiovascular: Negative for chest pain, palpitations and leg swelling.  Gastrointestinal: Negative for abdominal pain, constipation, diarrhea, nausea and vomiting.  Endocrine: Negative for cold intolerance.  Genitourinary: Negative for dysuria, frequency and hematuria.  Musculoskeletal: Negative for arthralgias and neck pain.  Skin: Negative for rash.  Allergic/Immunologic: Negative for environmental allergies.  Neurological: Negative for dizziness, weakness, light-headedness, numbness and headaches.  Hematological: Negative for adenopathy.  Psychiatric/Behavioral: Negative for behavioral problems, dysphoric mood and  sleep disturbance.   Per HPI unless specifically indicated above     Objective:    BP (!) 150/60 (BP Location: Left Arm, Cuff Size: Normal)   Pulse 77   Temp (!) 97.5 F (36.4 C)  (Temporal)   Resp 16   Ht 6\' 3"  (1.905 m)   Wt 290 lb (131.5 kg)   SpO2 97%   BMI 36.25 kg/m   Wt Readings from Last 3 Encounters:  01/28/20 290 lb (131.5 kg)  12/12/19 285 lb (129.3 kg)  12/04/19 288 lb 3.2 oz (130.7 kg)    Physical Exam Vitals and nursing note reviewed.  Constitutional:      General: He is not in acute distress.    Appearance: He is well-developed. He is obese. He is not diaphoretic.     Comments: Well-appearing, comfortable, cooperative  HENT:     Head: Normocephalic and atraumatic.  Eyes:     General:        Right eye: No discharge.        Left eye: No discharge.     Conjunctiva/sclera: Conjunctivae normal.     Pupils: Pupils are equal, round, and reactive to light.  Neck:     Thyroid: No thyromegaly.  Cardiovascular:     Rate and Rhythm: Normal rate and regular rhythm.     Heart sounds: Normal heart sounds. No murmur heard.   Pulmonary:     Effort: Pulmonary effort is normal. No respiratory distress.     Breath sounds: Normal breath sounds. No wheezing or rales.  Abdominal:     General: Bowel sounds are normal. There is no distension.     Palpations: Abdomen is soft. There is no mass.     Tenderness: There is no abdominal tenderness.  Musculoskeletal:        General: No tenderness. Normal range of motion.     Cervical back: Normal range of motion and neck supple.     Right lower leg: No edema.     Left lower leg: No edema.     Comments: Upper / Lower Extremities: - Normal muscle tone, strength bilateral upper extremities 5/5, lower extremities 5/5  Lymphadenopathy:     Cervical: No cervical adenopathy.  Skin:    General: Skin is warm and dry.     Findings: No erythema or rash.  Neurological:     Mental Status: He is alert and oriented to person, place, and time.     Comments: Distal sensation intact to light touch all extremities  Psychiatric:        Behavior: Behavior normal.     Comments: Well groomed, good eye contact, normal speech and  thoughts      Recent Labs    07/16/19 0751 01/28/20 1058  HGBA1C 8.3* 7.3*    Results for orders placed or performed in visit on 01/28/20  POCT HgB A1C  Result Value Ref Range   Hemoglobin A1C 7.3 (A) 4.0 - 5.6 %      Assessment & Plan:   Problem List Items Addressed This Visit    Type 2 diabetes with nephropathy ([place]) - Primary    Improved to A1c 7.3  RESOLVED Hypoglycemia Complications - CKD-III in setting of HTN as well, and other including hyperlipidemia - increases risk of future cardiovascular complications Off Rybelsus?  Plan:  1. Continue Metformin 1000 BID, Glimepiride 4mg  daily - declined GLP1 injection. Defer Rybelsus oral GLP1 2. Encourage improved lifestyle - low carb, low sugar diet, reduce portion size, continue  improving regular exercise 3. Check CBG, bring log to next visit for review 4. Continue ASA, ACEi, Statin 5. UTD DM [facility] 07/2019      Relevant Medications   glimepiride (AMARYL) 4 MG tablet   metFORMIN (GLUCOPHAGE) 1000 MG tablet   Other Relevant Orders   POCT HgB A1C (Completed)   Resistant hypertension    Overall improved after cardioversion for AFib.  Clinically asymptomatic today, resistant HTN Elevated BP but improved. Secondary anxiety white coat HTN - Home avg improved Complicated CKD-III - Prior work-up done by Vascular and Renal with negative secondary HTN work-up, negative RAS. Recent work-up negative Hyperaldosterone labs, TSH - Failed: Bystolic, Metoprolol, HCTZ, Diovan, Amlodipine - Followed by [facility]  No OSA, negative PSG  Plan:  1. Continue current BP regimen- continue Felodipine XL 10mg  daily, Benazepril 40mg , Clonidine 0.2mg  QID, Hydralazine 100mg  QID - torsemide PRN 2. Encourage improved lifestyle - low sodium diet, regular exercise 3. Continue monitor BP outside office, bring readings to next visit for review      Relevant Medications   cloNIDine (CATAPRES) 0.2 MG tablet   Hyperlipidemia  associated with type 2 diabetes mellitus ([place])    Last lab 07/2019 elevated Calculated ASCVD 10 yr risk score elevated risk w/ DM  Plan: 1. Continue current meds - Atorvastatin 80mg  daily, Gemfibrozil 600mg  BID, Fish Oil Omega 3 3. Continue ASA 81mg  for primary ASCVD risk reduction 4. Encourage improved lifestyle - low carb/cholesterol, reduce portion size, continue improving regular exercise      Relevant Medications   glimepiride (AMARYL) 4 MG tablet   metFORMIN (GLUCOPHAGE) 1000 MG tablet    Other Visit Diagnoses    Type 2 diabetes mellitus with nephropathy ([facility])       Relevant Medications   glimepiride (AMARYL) 4 MG tablet   metFORMIN (GLUCOPHAGE) 1000 MG tablet      Meds ordered this encounter  Medications  . glimepiride (AMARYL) 4 MG tablet    Sig: Take 1 tablet (4 mg total) by mouth daily with breakfast.    Dispense:  90 tablet    Refill:  3  . metFORMIN (GLUCOPHAGE) 1000 MG tablet    Sig: TAKE 1 TABLET BY MOUTH TWICE DAILY WITH A MEAL    Dispense:  180 tablet    Refill:  3    Add refills  . cloNIDine (CATAPRES) 0.2 MG tablet    Sig: Take 1 tablet by mouth 4 times daily    Dispense:  360 tablet    Refill:  3    Add refills      Follow up plan: Return in about 6 months (around 07/27/2020) for 6 month fasting lab only then 1 week later Annual Physical.  Future labs ordered for 08/06/20   Nobie Putnam, [facility] 01/28/2020, 11:05 AM

## 2020-01-28 NOTE — Assessment & Plan Note (Addendum)
Overall improved after cardioversion for AFib.  Clinically asymptomatic today, resistant HTN Elevated BP but improved. Secondary anxiety white coat HTN - Home avg improved Complicated CKD-III - Prior work-up done by Vascular and Renal with negative secondary HTN work-up, negative RAS. Recent work-up negative Hyperaldosterone labs, TSH - Failed: Bystolic, Metoprolol, HCTZ, Diovan, Amlodipine - Followed by [facility]  No OSA, negative PSG  Plan:  1. Continue current BP regimen- continue Felodipine XL 10mg  daily, Benazepril 40mg , Clonidine 0.2mg  QID, Hydralazine 100mg  QID - torsemide PRN 2. Encourage improved lifestyle - low sodium diet, regular exercise 3. Continue monitor BP outside office, bring readings to next visit for review

## 2020-02-23 ENCOUNTER — Other Ambulatory Visit: Payer: Self-pay | Admitting: Family

## 2020-02-23 ENCOUNTER — Other Ambulatory Visit: Payer: Self-pay | Admitting: Family Medicine

## 2020-02-23 DIAGNOSIS — I10 Essential (primary) hypertension: Secondary | ICD-10-CM

## 2020-02-24 ENCOUNTER — Other Ambulatory Visit: Payer: Self-pay | Admitting: Family

## 2020-02-24 DIAGNOSIS — I10 Essential (primary) hypertension: Secondary | ICD-10-CM

## 2020-02-25 ENCOUNTER — Other Ambulatory Visit: Payer: Self-pay | Admitting: Family

## 2020-02-25 DIAGNOSIS — I10 Essential (primary) hypertension: Secondary | ICD-10-CM

## 2020-03-04 ENCOUNTER — Other Ambulatory Visit: Payer: Self-pay | Admitting: Family

## 2020-03-04 DIAGNOSIS — I10 Essential (primary) hypertension: Secondary | ICD-10-CM

## 2020-03-12 ENCOUNTER — Telehealth: Payer: Self-pay | Admitting: Family Medicine

## 2020-03-12 NOTE — Telephone Encounter (Signed)
Copied from [place] [id number]. Topic: Medicare AWV >> Mar 12, 2020  2:08 PM Cher Nakai R wrote: Reason for CRM:   Left message for patient to call back and schedule the Medicare Annual Wellness Visit (AWV) virtually.  Last AWV 02/19/2019  Please schedule at anytime with [facility].  40 minute appointment  Any questions, please call me at [phone]

## 2020-03-24 ENCOUNTER — Ambulatory Visit (INDEPENDENT_AMBULATORY_CARE_PROVIDER_SITE_OTHER): Payer: PPO

## 2020-03-24 VITALS — Ht 74.0 in | Wt 277.0 lb

## 2020-03-24 DIAGNOSIS — Z0000 Encounter for general adult medical examination without abnormal findings: Secondary | ICD-10-CM | POA: Diagnosis not present

## 2020-03-24 NOTE — Progress Notes (Signed)
I connected with Trena Platt today by telephone and verified that I am speaking with the correct person using two identifiers. Location patient: home Location provider: work Persons participating in the virtual visit: Nazario, Whitehair LPN.   I discussed the limitations, risks, security and privacy concerns of performing an evaluation and management service by telephone and the availability of in person appointments. I also discussed with the patient that there may be a patient responsible charge related to this service. The patient expressed understanding and verbally consented to this telephonic visit.    Interactive audio and video telecommunications were attempted between this provider and patient, however failed, due to patient having technical difficulties OR patient did not have access to video capability.  We continued and completed visit with audio only.     Vital signs may be patient reported or missing.  Subjective:   Robert Daniel is a 80 y.o. male who presents for Medicare Annual/Subsequent preventive examination.  Review of Systems     Cardiac Risk Factors include: advanced age (>40mn, >>21women);diabetes mellitus;dyslipidemia;male gender;obesity (BMI >30kg/m2);sedentary lifestyle     Objective:    Today's Vitals   03/24/20 1137  Weight: 277 lb (125.6 kg)  Height: '6\' 2"'$  (1.88 m)   Body mass index is 35.56 kg/m.  Advanced Directives 03/24/2020 02/19/2019 08/08/2017  Does Patient Have a Medical Advance Directive? Yes No No  [place] - -  Copy of [place] Chart? Yes - validated most recent copy scanned in chart (See row information) - -  Would patient like information on creating a medical advance directive? - - Yes (MAU/Ambulatory/Procedural Areas - Information given)    Current Medications (verified) Outpatient Encounter Medications as of 03/24/2020  Medication Sig   . acetaminophen (TYLENOL) 500 MG tablet Take 500 mg by mouth every 6 (six) hours as needed.  . Ascorbic Acid (VITAMIN C) 1000 MG tablet Take 1,000 mg by mouth daily.  . benazepril (LOTENSIN) 40 MG tablet Take 1 tablet by mouth once daily  . Cinnamon 500 MG TABS Take 1,000 mg by mouth daily.  . cloNIDine (CATAPRES) 0.2 MG tablet Take 1 tablet by mouth 4 times daily  . co-enzyme Q-10 30 MG capsule Take 30 mg by mouth 3 (three) times daily.  . felodipine (PLENDIL) 10 MG 24 hr tablet Take 1 tablet (10 mg total) by mouth daily.  .Marland Kitchengemfibrozil (LOPID) 600 MG tablet TAKE 1 TABLET BY MOUTH TWICE DAILY BEFORE A MEAL  . hydrALAZINE (APRESOLINE) 100 MG tablet TAKE 1 TABLET BY MOUTH IN THE MORNING, AT NOON, IN THE EVENING, AND AT BEDTIME  . ibuprofen (ADVIL,MOTRIN) 200 MG tablet Take 800 mg by mouth every 6 (six) hours as needed.  . isosorbide mononitrate (IMDUR) 60 MG 24 hr tablet Take 1 tablet by mouth twice daily  . labetalol (NORMODYNE) 100 MG tablet Take 1 tablet (100 mg total) by mouth 2 (two) times daily. Take 1 tablet (100 mg total) by mouth 2 (two) times daily. May take an additional half tablet as needed for systolic blood pressure >[id number]  . metFORMIN (GLUCOPHAGE) 1000 MG tablet TAKE 1 TABLET BY MOUTH TWICE DAILY WITH A MEAL  . Multiple Vitamin (MULTIVITAMIN) tablet Take 1 tablet by mouth daily.  . nitroGLYCERIN (NITROSTAT) 0.4 MG SL tablet DISSOLVE ONE TABLET UNDER THE TONGUE EVERY 5 MINUTES AS NEEDED FOR CHEST PAIN.  DO NOT EXCEED A TOTAL OF 3 DOSES IN 15 MINUTES  .  Omega-3 Fatty Acids (OMEGA-3 FISH OIL) 1200 MG CAPS Take 2,000 mg by mouth 2 (two) times daily with a meal.  . rivaroxaban (XARELTO) 20 MG TABS tablet Take 1 tablet (20 mg total) by mouth daily with supper.  . Turmeric 500 MG TABS Take by mouth daily.   Marland Kitchen aspirin EC 81 MG tablet Take 81 mg by mouth daily. (Patient not taking: Reported on 03/24/2020)  . atorvastatin (LIPITOR) 80 MG tablet Take 1 tablet (80 mg total) by mouth daily.  Marland Kitchen  glimepiride (AMARYL) 4 MG tablet Take 1 tablet (4 mg total) by mouth daily with breakfast.  . torsemide (DEMADEX) 20 MG tablet Take 1 tablet (20 mg total) by mouth every other day.   No facility-administered encounter medications on file as of 03/24/2020.    Allergies (verified) Patient has no known allergies.   History: Past Medical History:  Diagnosis Date  . Hyperlipidemia 02/16/2017  . Hypertension    Past Surgical History:  Procedure Laterality Date  . CARDIAC CATHETERIZATION  2013  . CARDIOVERSION N/A 11/29/2019   Procedure: CARDIOVERSION;  Surgeon: Minna Merritts, MD;  Location: [facility] ORS;  Service: Cardiovascular;  Laterality: N/A;  . CATARACT EXTRACTION Bilateral 04/21/2008   R eye 05-12-08   History reviewed. No pertinent family history. Social History   Socioeconomic History  . Marital status: Divorced    Spouse name: Not on file  . Number of children: Not on file  . Years of education: Graduate  . Highest education level: Not on file  Occupational History  . Not on file  Tobacco Use  . Smoking status: Never Smoker  . Smokeless tobacco: Never Used  Vaping Use  . Vaping Use: Never used  Substance and Sexual Activity  . Alcohol use: No    Comment: wine occasionaly   . Drug use: No  . Sexual activity: Not on file  Other Topics Concern  . Not on file  Social History Narrative   Works part time Multimedia programmer in fall and spring      [employer]    Social Determinants of Health   Financial Resource Strain: [place]   . Difficulty of Paying Living Expenses: Not hard at all  Food Insecurity: No Food Insecurity  . Worried About Charity fundraiser in the Last Year: Never true  . Ran Out of Food in the Last Year: Never true  Transportation Needs: No Transportation Needs  . Lack of Transportation (Medical): No  . Lack of Transportation (Non-Medical): No  Physical Activity: Inactive  . Days of Exercise per Week: 0 days  . Minutes of Exercise per  Session: 0 min  Stress: No Stress Concern Present  . Feeling of Stress : Not at all  Social Connections: Not on file    Tobacco Counseling Counseling given: Not Answered   Clinical Intake:  Pre-visit preparation completed: Yes  Pain : No/denies pain     Nutritional Status: BMI > 30  Obese Nutritional Risks: None Diabetes: Yes  How often do you need to have someone help you when you read instructions, pamphlets, or other written materials from your doctor or pharmacy?: 1 - Never What is the last grade level you completed in school?: graduate  Diabetic? Yes Nutrition Risk Assessment:  Has the patient had any N/V/D within the last 2 months?  No  Does the patient have any non-healing wounds?  No  Has the patient had any unintentional weight loss or weight gain?  No  Diabetes:  Is the patient diabetic?  Yes  If diabetic, was a CBG obtained today?  No  Did the patient bring in their glucometer from home?  No  How often do you monitor your CBG's? Every other day.   Financial Strains and Diabetes Management:  Are you having any financial strains with the device, your supplies or your medication? No .  Does the patient want to be seen by Chronic Care Management for management of their diabetes?  No  Would the patient like to be referred to a Nutritionist or for Diabetic Management?  No   Diabetic Exams:  Diabetic Eye Exam: Completed 01/21/2020 Diabetic Foot Exam: Completed 07/24/2019   Interpreter Needed?: No  Information entered by :: NAllen LPN   Activities of Daily Living In your present state of health, do you have any difficulty performing the following activities: 03/24/2020  Hearing? N  Vision? N  Difficulty concentrating or making decisions? N  Walking or climbing stairs? Y  Comment gets tired  Dressing or bathing? N  Doing errands, shopping? N  Preparing Food and eating ? N  Using the Toilet? N  In the past six months, have you accidently leaked  urine? N  Do you have problems with loss of bowel control? N  Managing your Medications? N  Managing your Finances? N  Housekeeping or managing your Housekeeping? N  Some recent data might be hidden    Patient Care Team: Olin Hauser, DO as PCP - General (Family Medicine) Minna Merritts, MD as PCP - Cardiology (Cardiology)  Indicate any recent Medical Services you may have received from other than [facility] providers in the past year (date may be approximate).     Assessment:   This is a routine wellness examination for Rihan.  Hearing/Vision screen  Hearing Screening   '125Hz'$  '250Hz'$  '500Hz'$  '1000Hz'$  '2000Hz'$  '3000Hz'$  '4000Hz'$  '6000Hz'$  '8000Hz'$   Right ear:           Left ear:           Vision Screening Comments: Regular eye exams, Dr. Gloriann Loan  Dietary issues and exercise activities discussed: Current Exercise Habits: The patient does not participate in regular exercise at present  Goals    . DIET - INCREASE WATER INTAKE     Recommend continue drinking at least 6-8 glasses of water a day     . Patient Stated     03/24/2020, wants to weigh 270 pounds      Depression Screen PHQ 2/9 Scores 03/24/2020 01/28/2020 07/08/2019 02/19/2019 09/04/2018 02/14/2018 12/20/2016  PHQ - 2 Score 0 0 0 0 0 0 0  PHQ- 9 Score - - - - - 0 -    Fall Risk Fall Risk  03/24/2020 01/28/2020 07/24/2019 07/08/2019 02/19/2019  Falls in the past year? 0 0 0 0 0  Number falls in past yr: - 0 0 0 0  Injury with Fall? - 0 0 0 0  Risk for fall due to : Medication side effect - - - -  Follow up Falls evaluation completed;Education provided;Falls prevention discussed Falls evaluation completed Falls evaluation completed Falls evaluation completed -    FALL RISK PREVENTION PERTAINING TO THE HOME:  Any stairs in or around the home? Yes  If so, are there any without handrails? No  Home free of loose throw rugs in walkways, pet beds, electrical cords, etc? Yes  Adequate lighting in your home to reduce risk of falls? Yes    ASSISTIVE DEVICES UTILIZED TO PREVENT FALLS:  Life alert? No  Use of a cane, walker or w/c? Yes  Grab bars in the bathroom? No  Shower chair or bench in shower? Yes  Elevated toilet seat or a handicapped toilet? No   TIMED UP AND GO:  Was the test performed? No .   Cognitive Function:     6CIT Screen 03/24/2020 08/08/2017  What Year? 0 points 0 points  What month? 0 points 0 points  What time? 0 points 0 points  Count back from 20 0 points 0 points  Months in reverse 2 points 0 points  Repeat phrase 0 points 2 points  Total Score 2 2    Immunizations Immunization History  Administered Date(s) Administered  . Influenza, High Dose Seasonal PF 11/01/2017, 10/24/2018  . Influenza-Unspecified 10/28/2015, 11/01/2017, 10/24/2018, 10/31/2019  . PFIZER(Purple Top)SARS-COV-2 Vaccination 03/14/2019, 04/04/2019, 11/30/2019  . Pneumococcal Conjugate-13 12/14/2013  . Pneumococcal-Unspecified 10/08/2012, 10/28/2015  . Td 10/27/2010  . Zoster Recombinat (Shingrix) 10/08/2012, 12/04/2018, 02/02/2019    TDAP status: Up to date  Flu Vaccine status: Up to date  Pneumococcal vaccine status: Up to date  Covid-19 vaccine status: Completed vaccines  Qualifies for Shingles Vaccine? Yes   Zostavax completed No   Shingrix Completed?: Yes  Screening Tests Health Maintenance  Topic Date Due  . FOOT EXAM  07/23/2020  . HEMOGLOBIN A1C  07/27/2020  . TETANUS/TDAP  10/26/2020  . OPHTHALMOLOGY EXAM  01/20/2021  . INFLUENZA VACCINE  Completed  . COVID-19 Vaccine  Completed    Health Maintenance  There are no preventive care reminders to display for this patient.  Colorectal cancer screening: No longer required.   Lung Cancer Screening: (Low Dose CT Chest recommended if Age 55-80 years, 30 pack-year currently smoking OR have quit w/in 15years.) does not qualify.   Lung Cancer Screening Referral: no  Additional Screening:  Hepatitis C Screening: does not qualify;   Vision  Screening: Recommended annual ophthalmology exams for early detection of glaucoma and other disorders of the eye. Is the patient up to date with their annual eye exam?  Yes  Who is the provider or what is the name of the office in which the patient attends annual eye exams? Dr. Gloriann Loan If pt is not established with a provider, would they like to be referred to a provider to establish care? No .   Dental Screening: Recommended annual dental exams for proper oral hygiene  Community Resource Referral / Chronic Care Management: CRR required this visit?  No   CCM required this visit?  No      Plan:     I have personally reviewed and noted the following in the patient's chart:   . Medical and social history . Use of alcohol, tobacco or illicit drugs  . Current medications and supplements . Functional ability and status . Nutritional status . Physical activity . Advanced directives . List of other physicians . Hospitalizations, surgeries, and ER visits in previous 12 months . Vitals . Screenings to include cognitive, depression, and falls . Referrals and appointments  In addition, I have reviewed and discussed with patient certain preventive protocols, quality metrics, and best practice recommendations. A written personalized care plan for preventive services as well as general preventive health recommendations were provided to patient.     Kellie Simmering, LPN   [id number]   Nurse Notes:

## 2020-03-24 NOTE — Patient Instructions (Signed)
[name] , Thank you for taking time to come for your Medicare Wellness Visit. I appreciate your ongoing commitment to your health goals. Please review the following plan we discussed and let me know if I can assist you in the future.   Screening recommendations/referrals: Colonoscopy: not required Recommended yearly ophthalmology/optometry visit for glaucoma screening and checkup Recommended yearly dental visit for hygiene and checkup  Vaccinations: Influenza vaccine: completed 10/31/2019, due 10/05/2020 Pneumococcal vaccine: completed 10/28/2015 Tdap vaccine: completed 10/27/2010, due 10/26/2020 Shingles vaccine: completed   Covid-19:  11/30/2019, 04/04/2019, 03/14/2019  Advanced directives: copy in chart  Conditions/risks identified: none  Next appointment: Follow up in one year for your annual wellness visit.   Preventive Care 81 Years and Older, Male Preventive care refers to lifestyle choices and visits with your health care provider that can promote health and wellness. What does preventive care include?  A yearly physical exam. This is also called an annual well check.  Dental exams once or twice a year.  Routine eye exams. Ask your health care provider how often you should have your eyes checked.  Personal lifestyle choices, including:  Daily care of your teeth and gums.  Regular physical activity.  Eating a healthy diet.  Avoiding tobacco and drug use.  Limiting alcohol use.  Practicing safe sex.  Taking low doses of aspirin every day.  Taking vitamin and mineral supplements as recommended by your health care provider. What happens during an annual well check? The services and screenings done by your health care provider during your annual well check will depend on your age, overall health, lifestyle risk factors, and family history of disease. Counseling  Your health care provider may ask you questions about your:  Alcohol use.  Tobacco use.  Drug  use.  Emotional well-being.  Home and relationship well-being.  Sexual activity.  Eating habits.  History of falls.  Memory and ability to understand (cognition).  Work and work Statistician. Screening  You may have the following tests or measurements:  Height, weight, and BMI.  Blood pressure.  Lipid and cholesterol levels. These may be checked every 5 years, or more frequently if you are over 31 years old.  Skin check.  Lung cancer screening. You may have this screening every year starting at age 79 if you have a 30-pack-year history of smoking and currently smoke or have quit within the past 15 years.  Fecal occult blood test (FOBT) of the stool. You may have this test every year starting at age 64.  Flexible sigmoidoscopy or colonoscopy. You may have a sigmoidoscopy every 5 years or a colonoscopy every 10 years starting at age 66.  Prostate cancer screening. Recommendations will vary depending on your family history and other risks.  Hepatitis C blood test.  Hepatitis B blood test.  Sexually transmitted disease (STD) testing.  Diabetes screening. This is done by checking your blood sugar (glucose) after you have not eaten for a while (fasting). You may have this done every 1-3 years.  Abdominal aortic aneurysm (AAA) screening. You may need this if you are a current or former smoker.  Osteoporosis. You may be screened starting at age 25 if you are at high risk. Talk with your health care provider about your test results, treatment options, and if necessary, the need for more tests. Vaccines  Your health care provider may recommend certain vaccines, such as:  Influenza vaccine. This is recommended every year.  Tetanus, diphtheria, and acellular pertussis (Tdap, Td) vaccine. You may  need a Td booster every 10 years.  Zoster vaccine. You may need this after age 23.  Pneumococcal 13-valent conjugate (PCV13) vaccine. One dose is recommended after age  10.  Pneumococcal polysaccharide (PPSV23) vaccine. One dose is recommended after age 44. Talk to your health care provider about which screenings and vaccines you need and how often you need them. This information is not intended to replace advice given to you by your health care provider. Make sure you discuss any questions you have with your health care provider. Document Released: 03/20/2015 Document Revised: 11/11/2015 Document Reviewed: 12/23/2014 Elsevier Interactive Patient Education  2017 [employer] Prevention in the Home Falls can cause injuries. They can happen to people of all ages. There are many things you can do to make your home safe and to help prevent falls. What can I do on the outside of my home?  Regularly fix the edges of walkways and driveways and fix any cracks.  Remove anything that might make you trip as you walk through a door, such as a raised step or threshold.  Trim any bushes or trees on the path to your home.  Use bright outdoor lighting.  Clear any walking paths of anything that might make someone trip, such as rocks or tools.  Regularly check to see if handrails are loose or broken. Make sure that both sides of any steps have handrails.  Any raised decks and porches should have guardrails on the edges.  Have any leaves, snow, or ice cleared regularly.  Use sand or salt on walking paths during winter.  Clean up any spills in your garage right away. This includes oil or grease spills. What can I do in the bathroom?  Use night lights.  Install grab bars by the toilet and in the tub and shower. Do not use towel bars as grab bars.  Use non-skid mats or decals in the tub or shower.  If you need to sit down in the shower, use a plastic, non-slip stool.  Keep the floor dry. Clean up any water that spills on the floor as soon as it happens.  Remove soap buildup in the tub or shower regularly.  Attach bath mats securely with double-sided  non-slip rug tape.  Do not have throw rugs and other things on the floor that can make you trip. What can I do in the bedroom?  Use night lights.  Make sure that you have a light by your bed that is easy to reach.  Do not use any sheets or blankets that are too big for your bed. They should not hang down onto the floor.  Have a firm chair that has side arms. You can use this for support while you get dressed.  Do not have throw rugs and other things on the floor that can make you trip. What can I do in the kitchen?  Clean up any spills right away.  Avoid walking on wet floors.  Keep items that you use a lot in easy-to-reach places.  If you need to reach something above you, use a strong step stool that has a grab bar.  Keep electrical cords out of the way.  Do not use floor polish or wax that makes floors slippery. If you must use wax, use non-skid floor wax.  Do not have throw rugs and other things on the floor that can make you trip. What can I do with my stairs?  Do not leave any items on  the stairs.  Make sure that there are handrails on both sides of the stairs and use them. Fix handrails that are broken or loose. Make sure that handrails are as long as the stairways.  Check any carpeting to make sure that it is firmly attached to the stairs. Fix any carpet that is loose or worn.  Avoid having throw rugs at the top or bottom of the stairs. If you do have throw rugs, attach them to the floor with carpet tape.  Make sure that you have a light switch at the top of the stairs and the bottom of the stairs. If you do not have them, ask someone to add them for you. What else can I do to help prevent falls?  Wear shoes that:  Do not have high heels.  Have rubber bottoms.  Are comfortable and fit you well.  Are closed at the toe. Do not wear sandals.  If you use a stepladder:  Make sure that it is fully opened. Do not climb a closed stepladder.  Make sure that both  sides of the stepladder are locked into place.  Ask someone to hold it for you, if possible.  Clearly mark and make sure that you can see:  Any grab bars or handrails.  First and last steps.  Where the edge of each step is.  Use tools that help you move around (mobility aids) if they are needed. These include:  Canes.  Walkers.  Scooters.  Crutches.  Turn on the lights when you go into a dark area. Replace any light bulbs as soon as they burn out.  Set up your furniture so you have a clear path. Avoid moving your furniture around.  If any of your floors are uneven, fix them.  If there are any pets around you, be aware of where they are.  Review your medicines with your doctor. Some medicines can make you feel dizzy. This can increase your chance of falling. Ask your doctor what other things that you can do to help prevent falls. This information is not intended to replace advice given to you by your health care provider. Make sure you discuss any questions you have with your health care provider. Document Released: 12/18/2008 Document Revised: 07/30/2015 Document Reviewed: 03/28/2014 Elsevier Interactive Patient Education  2017 [employer].

## 2020-03-26 ENCOUNTER — Telehealth: Payer: Self-pay | Admitting: Cardiovascular Disease

## 2020-03-26 ENCOUNTER — Other Ambulatory Visit: Payer: Self-pay

## 2020-03-26 DIAGNOSIS — E1121 Type 2 diabetes mellitus with diabetic nephropathy: Secondary | ICD-10-CM

## 2020-03-26 MED ORDER — RIVAROXABAN 20 MG PO TABS: 20.0000 mg | ORAL_TABLET | Freq: Every day | ORAL | 1 refills | Status: AC

## 2020-03-26 MED ORDER — GLIMEPIRIDE 4 MG PO TABS: 4.0000 mg | ORAL_TABLET | Freq: Every day | ORAL | 3 refills | Status: AC

## 2020-03-26 NOTE — Telephone Encounter (Signed)
Rx request sent to pharmacy.  

## 2020-03-26 NOTE — Telephone Encounter (Signed)
*  STAT* If patient is at the pharmacy, call can be transferred to refill team.   1. Which medications need to be refilled? (please list name of each medication and dose if known) Xarelto 20 MG 1 tablet daily   2. Which pharmacy/location (including street and city if local pharmacy) is medication to be sent to? [facility] on [address]   3. Do they need a 30 day or 90 day supply? 90 day

## 2020-04-14 ENCOUNTER — Other Ambulatory Visit: Payer: Self-pay | Admitting: Family Medicine

## 2020-04-14 DIAGNOSIS — E1121 Type 2 diabetes mellitus with diabetic nephropathy: Secondary | ICD-10-CM

## 2020-04-23 ENCOUNTER — Telehealth: Payer: Self-pay | Admitting: Physician Assistant

## 2020-04-23 ENCOUNTER — Telehealth: Payer: Self-pay

## 2020-04-23 ENCOUNTER — Telehealth: Payer: Self-pay | Admitting: *Deleted

## 2020-04-23 NOTE — Telephone Encounter (Signed)
  Pt c/o BP issue: STAT if pt c/o blurred vision, one-sided weakness or slurred speech  1. What are your last 5 BP readings? 113/89  2. Are you having any other symptoms (ex. Dizziness, headache, blurred vision, passed out)? Lightheaded, slurring speech   3. What is your BP issue? low

## 2020-04-23 NOTE — Telephone Encounter (Signed)
Spoke to Robert Daniel, states EMS arrived and after assessment did not take to hospital.  States he was told to hold all BP meds until he hears back from provider.  Robert Daniel does continue to feel lightheaded. BP currently 109/80.  Spoke with provider in office and advised Robert Daniel come in tomorrow for DOD visit.   Robert Daniel agreeable and scheduled to see Dr. Garen Lah tomorrow 2/18 @ 3:20.  Advised Robert Daniel arrive early at 3:00 PM. Robert Daniel verbalized understanding.

## 2020-04-23 NOTE — Telephone Encounter (Signed)
Copied from [place] [id number]. Topic: General - Other >> Apr 23, 2020 11:46 AM Leward Quan A wrote: Reason for CRM: Patient sister called in from [place] to inform Dr Raliegh Ip that patient was home having some issues and slurring his words seemed to be more forgetful also. He is home with friend Vaughan Basta who was informed by Cardiology to call 911 and have him checked out patient did not want 911 called. Just to be informed

## 2020-04-23 NOTE — Telephone Encounter (Signed)
Acknowledged. Yes it looks like the patient reached Cardiology office earlier today and patient/friend advised to call 911 for EMS evaluation.  Nobie Putnam, DO [facility] 04/23/2020, 1:57 PM

## 2020-04-23 NOTE — Telephone Encounter (Signed)
See telephone note 04/23/20.

## 2020-04-23 NOTE — Telephone Encounter (Signed)
See earlier telephone note from today 04/23/20 for complete note.

## 2020-04-23 NOTE — Telephone Encounter (Signed)
Pt called on speaker phone with family friend present.  Pt reports BP 113/89 and feels "lightheaded" currently. Friend reports pt w/ slurred speech "off and on since Monday". Pt does have slurred speech at this time on phone.  Reported FSBS 207.  Stroke assessment questions asked and positive for s/s stroke.   Friend states that when I asked pt to smile that face is asymmetrical - LEFT side is lower. Pt able to repeat sentence "the early bird catches the worm," however speech is slurred.  Arms stretched out in front, with LEFT arm weakness.   Advised that friend call 911 now d/t significant s/s of stroke.  Friend confirmed that she is calling as soon as we hang up the phone.  Will notify provider to make aware.

## 2020-04-23 NOTE — Telephone Encounter (Signed)
Spoke to pt, states EMS arrived and after assessment did not take to hospital.  States he was told to hold all BP meds until he hears back from provider.  Pt does continue to feel lightheaded. BP currently 109/80.  Spoke with provider in office and advised pt come in tomorrow for DOD visit.   Pt agreeable and scheduled to see Dr. Garen Lah tomorrow 2/18 @ 3:20.  Advised pt arrive early at 3:00 PM. Pt verbalized understanding.

## 2020-04-24 ENCOUNTER — Telehealth (INDEPENDENT_AMBULATORY_CARE_PROVIDER_SITE_OTHER): Payer: PPO | Admitting: Cardiology

## 2020-04-24 ENCOUNTER — Other Ambulatory Visit: Payer: Self-pay

## 2020-04-24 ENCOUNTER — Encounter: Payer: Self-pay | Admitting: Cardiology

## 2020-04-24 VITALS — BP 161/83 | HR 86 | Ht 75.0 in | Wt 250.0 lb

## 2020-04-24 DIAGNOSIS — I480 Paroxysmal atrial fibrillation: Secondary | ICD-10-CM | POA: Diagnosis not present

## 2020-04-24 DIAGNOSIS — I10 Essential (primary) hypertension: Secondary | ICD-10-CM

## 2020-04-24 DIAGNOSIS — R4781 Slurred speech: Secondary | ICD-10-CM

## 2020-04-24 NOTE — Progress Notes (Signed)
Virtual Visit via Telephone Note   This visit type was conducted due to national recommendations for restrictions regarding the COVID-19 Pandemic (e.g. social distancing) in an effort to limit this patient's exposure and mitigate transmission in our community.  Due to his co-morbid illnesses, this patient is at least at moderate risk for complications without adequate follow up.  This format is felt to be most appropriate for this patient at this time.  The patient did not have access to video technology/had technical difficulties with video requiring transitioning to audio format only (telephone).  All issues noted in this document were discussed and addressed.  No physical exam could be performed with this format.  Please refer to the patient's chart for his  consent to telehealth for [facility].   Date:  04/24/2020   ID:  Robert Daniel, DOB 1941/01/11, MRN [id number]  Patient Location: Home Provider Location: Office/Clinic  PCP:  Olin Hauser, DO  Cardiologist:  Ida Rogue, MD  Electrophysiologist:  None   Evaluation Performed:  Follow-Up Visit  Chief Complaint:    History of Present Illness:    Robert Daniel is a 80 y.o. male with history of atrial fibrillation status post DC cardioversion on 11/29/2019, diabetes, hyperlipidemia, hypertension, anxiety being seen due to slurred speech.  Patient was at home yesterday with some friends when his speech became slurred.  EMS was called, upon EMS presentation, his vitals were noted to be okay with blood pressure on the low side with systolic around [id number].  Patient has back pain, current blood pressures in the [id number] systolic.  He is compliant with his medications, on Xarelto for A. fib.  Denies any palpitations, shortness of breath.  The patient does not have symptoms concerning for COVID-19 infection (fever, chills, cough, or new shortness of breath).    Past Medical History:  Diagnosis Date  . Hyperlipidemia  02/16/2017  . Hypertension    Past Surgical History:  Procedure Laterality Date  . CARDIAC CATHETERIZATION  2013  . CARDIOVERSION N/A 11/29/2019   Procedure: CARDIOVERSION;  Surgeon: Minna Merritts, MD;  Location: [facility] ORS;  Service: Cardiovascular;  Laterality: N/A;  . CATARACT EXTRACTION Bilateral 04/21/2008   R eye 05-12-08     Current Meds  Medication Sig  . acetaminophen (TYLENOL) 500 MG tablet Take 500 mg by mouth every 6 (six) hours as needed.  . Ascorbic Acid (VITAMIN C) 1000 MG tablet Take 1,000 mg by mouth daily.  Marland Kitchen aspirin EC 81 MG tablet Take 81 mg by mouth daily.  . benazepril (LOTENSIN) 40 MG tablet Take 1 tablet by mouth once daily  . Cinnamon 500 MG TABS Take 1,000 mg by mouth daily.  . cloNIDine (CATAPRES) 0.2 MG tablet Take 1 tablet by mouth 4 times daily  . co-enzyme Q-10 30 MG capsule Take 30 mg by mouth 3 (three) times daily.  . felodipine (PLENDIL) 10 MG 24 hr tablet Take 1 tablet (10 mg total) by mouth daily.  Marland Kitchen gemfibrozil (LOPID) 600 MG tablet TAKE 1 TABLET BY MOUTH TWICE DAILY BEFORE A MEAL  . glimepiride (AMARYL) 4 MG tablet Take 1 tablet (4 mg total) by mouth daily with breakfast.  . hydrALAZINE (APRESOLINE) 100 MG tablet TAKE 1 TABLET BY MOUTH IN THE MORNING, AT NOON, IN THE EVENING, AND AT BEDTIME  . ibuprofen (ADVIL,MOTRIN) 200 MG tablet Take 800 mg by mouth every 6 (six) hours as needed.  . isosorbide mononitrate (IMDUR) 60 MG 24 hr tablet Take 1 tablet by mouth twice  daily  . labetalol (NORMODYNE) 100 MG tablet Take 1 tablet (100 mg total) by mouth 2 (two) times daily. Take 1 tablet (100 mg total) by mouth 2 (two) times daily. May take an additional half tablet as needed for systolic blood pressure [id number].  . metFORMIN (GLUCOPHAGE) 1000 MG tablet TAKE 1 TABLET BY MOUTH TWICE DAILY WITH A MEAL  . Multiple Vitamin (MULTIVITAMIN) tablet Take 1 tablet by mouth daily.  . nitroGLYCERIN (NITROSTAT) 0.4 MG SL tablet DISSOLVE ONE TABLET UNDER THE TONGUE EVERY 5  MINUTES AS NEEDED FOR CHEST PAIN.  DO NOT EXCEED A TOTAL OF 3 DOSES IN 15 MINUTES  . Omega-3 Fatty Acids (OMEGA-3 FISH OIL) 1200 MG CAPS Take 2,000 mg by mouth 2 (two) times daily with a meal.  . rivaroxaban (XARELTO) 20 MG TABS tablet Take 1 tablet (20 mg total) by mouth daily with supper.  . Turmeric 500 MG TABS Take by mouth daily.      Allergies:   Patient has no known allergies.   Social History   Tobacco Use  . Smoking status: Never Smoker  . Smokeless tobacco: Never Used  Vaping Use  . Vaping Use: Never used  Substance Use Topics  . Alcohol use: No    Comment: wine occasionaly   . Drug use: No     Family Hx: The patient's family history is not on file.  ROS:   Please see the history of present illness.     All other systems reviewed and are negative.   Prior CV studies:   The following studies were reviewed today:    Labs/Other Tests and Data Reviewed:    EKG:  No ECG reviewed.  Recent Labs: 10/28/2019: Hemoglobin 11.0; Platelets 195 12/04/2019: ALT 19; BUN 20; Creatinine, Ser 1.23; Potassium 3.9; Sodium 139   Recent Lipid Panel Lab Results  Component Value Date/Time   CHOL 190 07/16/2019 07:51 AM   TRIG 241 (H) 07/16/2019 07:51 AM   HDL 32 (L) 07/16/2019 07:51 AM   CHOLHDL 5.9 (H) 07/16/2019 07:51 AM   LDLCALC 121 (H) 07/16/2019 07:51 AM    Wt Readings from Last 3 Encounters:  04/24/20 250 lb (113.4 kg)  03/24/20 277 lb (125.6 kg)  01/28/20 290 lb (131.5 kg)     Objective:    Vital Signs:  BP (!) 161/83   Pulse 86   Ht '6\' 3"'$  (1.905 m)   Wt 250 lb (113.4 kg)   BMI 31.25 kg/m    VITAL SIGNS:  reviewed  ASSESSMENT & PLAN:    1. Slurred speech, currently resolved, recommend patient be evaluated in the office due to limited to no physical exam approximately via telemedicine/phone visit.  His blood pressures appear to have improved. 2. Hypertension, BP appears improved to elevated, back pain likely contributing to elevated blood pressures.   Continue medications as prescribed. 3. Atrial fibrillation, status post DC cardioversion.  Denies palpitations.  Continue labetalol and Xarelto as prescribed.  Follow-up next week in the office in person.  COVID-19 Education: The signs and symptoms of COVID-19 were discussed with the patient and how to seek care for testing (follow up with PCP or arrange E-visit).  The importance of social distancing was discussed today.  Time:   Today, I have spent 35 minutes with the patient with telehealth technology discussing the above problems.     Medication Adjustments/Labs and Tests Ordered: Current medicines are reviewed at length with the patient today.  Concerns regarding medicines are outlined above.  Tests Ordered: No orders of the defined types were placed in this encounter.   Medication Changes: No orders of the defined types were placed in this encounter.   Follow Up:  In Person in 1 week(s)  Signed, Kate Sable, MD  04/24/2020 5:18 PM    [facility]

## 2020-04-24 NOTE — Patient Instructions (Signed)
Medication Instructions:  Your physician recommends that you continue on your current medications as directed. Please refer to the Current Medication list given to you today.  *If you need a refill on your cardiac medications before your next appointment, please call your pharmacy*   Lab Work: None ordered If you have labs (blood work) drawn today and your tests are completely normal, you will receive your results only by: Marland Kitchen MyChart Message (if you have MyChart) OR . A paper copy in the mail If you have any lab test that is abnormal or we need to change your treatment, we will call you to review the results.   Testing/Procedures: None ordered   Follow-Up: At [facility], you and your health needs are our priority.  As part of our continuing mission to provide you with exceptional heart care, we have created designated Provider Care Teams.  These Care Teams include your primary Cardiologist (physician) and Advanced Practice Providers (APPs -  Physician Assistants and Nurse Practitioners) who all work together to provide you with the care you need, when you need it.  We recommend signing up for the patient portal called "MyChart".  Sign up information is provided on this After Visit Summary.  MyChart is used to connect with patients for Virtual Visits (Telemedicine).  Patients are able to view lab/test results, encounter notes, upcoming appointments, etc.  Non-urgent messages can be sent to your provider as well.   To learn more about what you can do with MyChart, go to [email or web address].    Your next appointment:   1 week(s)  The format for your next appointment:   In Person  Provider:   You may see Ida Rogue, MD or one of the following Advanced Practice Providers on your designated Care Team:    Murray Hodgkins, NP  Christell Faith, PA-C  Marrianne Mood, PA-C  Cadence Arrib[place], [place]  Laurann Montana, NP    Other Instructions N/A

## 2020-04-25 ENCOUNTER — Emergency Department (HOSPITAL_COMMUNITY): Payer: PPO | Admitting: Anesthesiology

## 2020-04-25 ENCOUNTER — Emergency Department: Payer: PPO

## 2020-04-25 ENCOUNTER — Inpatient Hospital Stay (HOSPITAL_COMMUNITY): Payer: PPO

## 2020-04-25 ENCOUNTER — Other Ambulatory Visit: Payer: Self-pay

## 2020-04-25 ENCOUNTER — Emergency Department
Admission: EM | Admit: 2020-04-25 | Discharge: 2020-04-25 | Disposition: A | Discharge status: Other Acute Inpatient Hospital | Payer: PPO | Attending: Emergency Medicine | Admitting: Emergency Medicine

## 2020-04-25 ENCOUNTER — Encounter (HOSPITAL_COMMUNITY): Payer: Self-pay | Admitting: Anesthesiology

## 2020-04-25 ENCOUNTER — Inpatient Hospital Stay (HOSPITAL_COMMUNITY)
Admission: EM | Admit: 2020-04-25 | Discharge: 2020-05-05 | DRG: 023 | Disposition: E | Payer: PPO | Attending: Internal Medicine | Admitting: Internal Medicine

## 2020-04-25 ENCOUNTER — Emergency Department (HOSPITAL_COMMUNITY): Payer: PPO

## 2020-04-25 ENCOUNTER — Inpatient Hospital Stay: Admit: 2020-04-25 | Payer: PPO

## 2020-04-25 ENCOUNTER — Encounter (HOSPITAL_COMMUNITY): Admission: EM | Disposition: E | Payer: Self-pay | Source: Home / Self Care | Attending: Neurology

## 2020-04-25 DIAGNOSIS — R531 Weakness: Secondary | ICD-10-CM | POA: Diagnosis not present

## 2020-04-25 DIAGNOSIS — D6489 Other specified anemias: Secondary | ICD-10-CM | POA: Diagnosis present

## 2020-04-25 DIAGNOSIS — I959 Hypotension, unspecified: Secondary | ICD-10-CM | POA: Diagnosis not present

## 2020-04-25 DIAGNOSIS — J9601 Acute respiratory failure with hypoxia: Secondary | ICD-10-CM | POA: Diagnosis not present

## 2020-04-25 DIAGNOSIS — J189 Pneumonia, unspecified organism: Secondary | ICD-10-CM | POA: Diagnosis not present

## 2020-04-25 DIAGNOSIS — I130 Hypertensive heart and chronic kidney disease with heart failure and stage 1 through stage 4 chronic kidney disease, or unspecified chronic kidney disease: Secondary | ICD-10-CM | POA: Diagnosis not present

## 2020-04-25 DIAGNOSIS — W19XXXA Unspecified fall, initial encounter: Secondary | ICD-10-CM

## 2020-04-25 DIAGNOSIS — R6521 Severe sepsis with septic shock: Secondary | ICD-10-CM | POA: Diagnosis not present

## 2020-04-25 DIAGNOSIS — E875 Hyperkalemia: Secondary | ICD-10-CM | POA: Diagnosis not present

## 2020-04-25 DIAGNOSIS — R29706 NIHSS score 6: Secondary | ICD-10-CM | POA: Diagnosis present

## 2020-04-25 DIAGNOSIS — N390 Urinary tract infection, site not specified: Secondary | ICD-10-CM | POA: Diagnosis not present

## 2020-04-25 DIAGNOSIS — E874 Mixed disorder of acid-base balance: Secondary | ICD-10-CM | POA: Diagnosis not present

## 2020-04-25 DIAGNOSIS — N170 Acute kidney failure with tubular necrosis: Secondary | ICD-10-CM | POA: Diagnosis not present

## 2020-04-25 DIAGNOSIS — E872 Acidosis: Secondary | ICD-10-CM | POA: Diagnosis not present

## 2020-04-25 DIAGNOSIS — R0989 Other specified symptoms and signs involving the circulatory and respiratory systems: Secondary | ICD-10-CM | POA: Diagnosis not present

## 2020-04-25 DIAGNOSIS — Z515 Encounter for palliative care: Secondary | ICD-10-CM | POA: Diagnosis not present

## 2020-04-25 DIAGNOSIS — Z7982 Long term (current) use of aspirin: Secondary | ICD-10-CM | POA: Diagnosis not present

## 2020-04-25 DIAGNOSIS — N179 Acute kidney failure, unspecified: Secondary | ICD-10-CM | POA: Diagnosis not present

## 2020-04-25 DIAGNOSIS — E1140 Type 2 diabetes mellitus with diabetic neuropathy, unspecified: Secondary | ICD-10-CM | POA: Diagnosis not present

## 2020-04-25 DIAGNOSIS — I63313 Cerebral infarction due to thrombosis of bilateral middle cerebral arteries: Principal | ICD-10-CM | POA: Diagnosis present

## 2020-04-25 DIAGNOSIS — Z7901 Long term (current) use of anticoagulants: Secondary | ICD-10-CM | POA: Insufficient documentation

## 2020-04-25 DIAGNOSIS — D649 Anemia, unspecified: Secondary | ICD-10-CM | POA: Diagnosis not present

## 2020-04-25 DIAGNOSIS — Z452 Encounter for adjustment and management of vascular access device: Secondary | ICD-10-CM

## 2020-04-25 DIAGNOSIS — Z79899 Other long term (current) drug therapy: Secondary | ICD-10-CM | POA: Diagnosis not present

## 2020-04-25 DIAGNOSIS — I4891 Unspecified atrial fibrillation: Secondary | ICD-10-CM | POA: Diagnosis not present

## 2020-04-25 DIAGNOSIS — E1165 Type 2 diabetes mellitus with hyperglycemia: Secondary | ICD-10-CM | POA: Diagnosis not present

## 2020-04-25 DIAGNOSIS — I6503 Occlusion and stenosis of bilateral vertebral arteries: Secondary | ICD-10-CM | POA: Diagnosis not present

## 2020-04-25 DIAGNOSIS — E11649 Type 2 diabetes mellitus with hypoglycemia without coma: Secondary | ICD-10-CM | POA: Diagnosis present

## 2020-04-25 DIAGNOSIS — N183 Chronic kidney disease, stage 3 unspecified: Secondary | ICD-10-CM | POA: Diagnosis not present

## 2020-04-25 DIAGNOSIS — Z8673 Personal history of transient ischemic attack (TIA), and cerebral infarction without residual deficits: Secondary | ICD-10-CM | POA: Diagnosis not present

## 2020-04-25 DIAGNOSIS — R4781 Slurred speech: Secondary | ICD-10-CM | POA: Diagnosis not present

## 2020-04-25 DIAGNOSIS — W010XXA Fall on same level from slipping, tripping and stumbling without subsequent striking against object, initial encounter: Secondary | ICD-10-CM | POA: Insufficient documentation

## 2020-04-25 DIAGNOSIS — E1122 Type 2 diabetes mellitus with diabetic chronic kidney disease: Secondary | ICD-10-CM | POA: Diagnosis not present

## 2020-04-25 DIAGNOSIS — Z7984 Long term (current) use of oral hypoglycemic drugs: Secondary | ICD-10-CM | POA: Insufficient documentation

## 2020-04-25 DIAGNOSIS — N19 Unspecified kidney failure: Secondary | ICD-10-CM | POA: Diagnosis not present

## 2020-04-25 DIAGNOSIS — E669 Obesity, unspecified: Secondary | ICD-10-CM | POA: Diagnosis present

## 2020-04-25 DIAGNOSIS — I482 Chronic atrial fibrillation, unspecified: Secondary | ICD-10-CM | POA: Diagnosis present

## 2020-04-25 DIAGNOSIS — Z992 Dependence on renal dialysis: Secondary | ICD-10-CM

## 2020-04-25 DIAGNOSIS — D631 Anemia in chronic kidney disease: Secondary | ICD-10-CM | POA: Diagnosis present

## 2020-04-25 DIAGNOSIS — Z01818 Encounter for other preprocedural examination: Secondary | ICD-10-CM | POA: Diagnosis not present

## 2020-04-25 DIAGNOSIS — I4821 Permanent atrial fibrillation: Secondary | ICD-10-CM

## 2020-04-25 DIAGNOSIS — I5023 Acute on chronic systolic (congestive) heart failure: Secondary | ICD-10-CM | POA: Diagnosis present

## 2020-04-25 DIAGNOSIS — A4101 Sepsis due to Methicillin susceptible Staphylococcus aureus: Secondary | ICD-10-CM | POA: Diagnosis not present

## 2020-04-25 DIAGNOSIS — G8194 Hemiplegia, unspecified affecting left nondominant side: Secondary | ICD-10-CM | POA: Diagnosis present

## 2020-04-25 DIAGNOSIS — I5021 Acute systolic (congestive) heart failure: Secondary | ICD-10-CM | POA: Diagnosis not present

## 2020-04-25 DIAGNOSIS — I469 Cardiac arrest, cause unspecified: Secondary | ICD-10-CM | POA: Diagnosis not present

## 2020-04-25 DIAGNOSIS — Z6832 Body mass index (BMI) 32.0-32.9, adult: Secondary | ICD-10-CM

## 2020-04-25 DIAGNOSIS — G936 Cerebral edema: Secondary | ICD-10-CM | POA: Diagnosis not present

## 2020-04-25 DIAGNOSIS — Z4659 Encounter for fitting and adjustment of other gastrointestinal appliance and device: Secondary | ICD-10-CM

## 2020-04-25 DIAGNOSIS — T45515A Adverse effect of anticoagulants, initial encounter: Secondary | ICD-10-CM | POA: Diagnosis not present

## 2020-04-25 DIAGNOSIS — Z0189 Encounter for other specified special examinations: Secondary | ICD-10-CM

## 2020-04-25 DIAGNOSIS — Z978 Presence of other specified devices: Secondary | ICD-10-CM | POA: Diagnosis not present

## 2020-04-25 DIAGNOSIS — R29810 Facial weakness: Secondary | ICD-10-CM | POA: Diagnosis not present

## 2020-04-25 DIAGNOSIS — I10 Essential (primary) hypertension: Secondary | ICD-10-CM | POA: Diagnosis not present

## 2020-04-25 DIAGNOSIS — Z20822 Contact with and (suspected) exposure to covid-19: Secondary | ICD-10-CM | POA: Diagnosis not present

## 2020-04-25 DIAGNOSIS — J9690 Respiratory failure, unspecified, unspecified whether with hypoxia or hypercapnia: Secondary | ICD-10-CM

## 2020-04-25 DIAGNOSIS — B9561 Methicillin susceptible Staphylococcus aureus infection as the cause of diseases classified elsewhere: Secondary | ICD-10-CM | POA: Diagnosis not present

## 2020-04-25 DIAGNOSIS — R279 Unspecified lack of coordination: Secondary | ICD-10-CM | POA: Diagnosis not present

## 2020-04-25 DIAGNOSIS — I63411 Cerebral infarction due to embolism of right middle cerebral artery: Secondary | ICD-10-CM | POA: Diagnosis not present

## 2020-04-25 DIAGNOSIS — I639 Cerebral infarction, unspecified: Secondary | ICD-10-CM | POA: Diagnosis present

## 2020-04-25 DIAGNOSIS — R918 Other nonspecific abnormal finding of lung field: Secondary | ICD-10-CM | POA: Diagnosis not present

## 2020-04-25 DIAGNOSIS — I63019 Cerebral infarction due to thrombosis of unspecified vertebral artery: Secondary | ICD-10-CM | POA: Diagnosis not present

## 2020-04-25 DIAGNOSIS — I330 Acute and subacute infective endocarditis: Secondary | ICD-10-CM | POA: Diagnosis not present

## 2020-04-25 DIAGNOSIS — I340 Nonrheumatic mitral (valve) insufficiency: Secondary | ICD-10-CM | POA: Diagnosis not present

## 2020-04-25 DIAGNOSIS — I6601 Occlusion and stenosis of right middle cerebral artery: Secondary | ICD-10-CM | POA: Diagnosis not present

## 2020-04-25 DIAGNOSIS — R0602 Shortness of breath: Secondary | ICD-10-CM | POA: Diagnosis present

## 2020-04-25 DIAGNOSIS — D72829 Elevated white blood cell count, unspecified: Secondary | ICD-10-CM | POA: Diagnosis not present

## 2020-04-25 DIAGNOSIS — I6523 Occlusion and stenosis of bilateral carotid arteries: Secondary | ICD-10-CM

## 2020-04-25 DIAGNOSIS — A419 Sepsis, unspecified organism: Secondary | ICD-10-CM | POA: Diagnosis not present

## 2020-04-25 DIAGNOSIS — I63511 Cerebral infarction due to unspecified occlusion or stenosis of right middle cerebral artery: Secondary | ICD-10-CM

## 2020-04-25 DIAGNOSIS — G928 Other toxic encephalopathy: Secondary | ICD-10-CM | POA: Diagnosis not present

## 2020-04-25 DIAGNOSIS — L8990 Pressure ulcer of unspecified site, unspecified stage: Secondary | ICD-10-CM | POA: Insufficient documentation

## 2020-04-25 DIAGNOSIS — E8779 Other fluid overload: Secondary | ICD-10-CM | POA: Diagnosis not present

## 2020-04-25 DIAGNOSIS — I079 Rheumatic tricuspid valve disease, unspecified: Secondary | ICD-10-CM | POA: Diagnosis not present

## 2020-04-25 DIAGNOSIS — R0902 Hypoxemia: Secondary | ICD-10-CM

## 2020-04-25 DIAGNOSIS — E785 Hyperlipidemia, unspecified: Secondary | ICD-10-CM | POA: Diagnosis present

## 2020-04-25 DIAGNOSIS — Z743 Need for continuous supervision: Secondary | ICD-10-CM | POA: Diagnosis not present

## 2020-04-25 DIAGNOSIS — E7800 Pure hypercholesterolemia, unspecified: Secondary | ICD-10-CM

## 2020-04-25 DIAGNOSIS — R000 Tachycardia, unspecified: Secondary | ICD-10-CM | POA: Diagnosis not present

## 2020-04-25 DIAGNOSIS — I76 Septic arterial embolism: Secondary | ICD-10-CM | POA: Diagnosis not present

## 2020-04-25 DIAGNOSIS — I129 Hypertensive chronic kidney disease with stage 1 through stage 4 chronic kidney disease, or unspecified chronic kidney disease: Secondary | ICD-10-CM | POA: Diagnosis not present

## 2020-04-25 DIAGNOSIS — Z9889 Other specified postprocedural states: Secondary | ICD-10-CM | POA: Diagnosis not present

## 2020-04-25 DIAGNOSIS — I4819 Other persistent atrial fibrillation: Secondary | ICD-10-CM | POA: Diagnosis not present

## 2020-04-25 DIAGNOSIS — I6389 Other cerebral infarction: Secondary | ICD-10-CM | POA: Diagnosis not present

## 2020-04-25 DIAGNOSIS — E1110 Type 2 diabetes mellitus with ketoacidosis without coma: Secondary | ICD-10-CM | POA: Diagnosis not present

## 2020-04-25 DIAGNOSIS — J9811 Atelectasis: Secondary | ICD-10-CM | POA: Diagnosis not present

## 2020-04-25 DIAGNOSIS — Z4682 Encounter for fitting and adjustment of non-vascular catheter: Secondary | ICD-10-CM | POA: Diagnosis not present

## 2020-04-25 DIAGNOSIS — R34 Anuria and oliguria: Secondary | ICD-10-CM | POA: Diagnosis not present

## 2020-04-25 DIAGNOSIS — Z66 Do not resuscitate: Secondary | ICD-10-CM | POA: Diagnosis not present

## 2020-04-25 DIAGNOSIS — I672 Cerebral atherosclerosis: Secondary | ICD-10-CM | POA: Diagnosis present

## 2020-04-25 DIAGNOSIS — R7881 Bacteremia: Secondary | ICD-10-CM | POA: Diagnosis not present

## 2020-04-25 DIAGNOSIS — G9341 Metabolic encephalopathy: Secondary | ICD-10-CM | POA: Diagnosis not present

## 2020-04-25 DIAGNOSIS — L89812 Pressure ulcer of head, stage 2: Secondary | ICD-10-CM | POA: Diagnosis not present

## 2020-04-25 DIAGNOSIS — J9600 Acute respiratory failure, unspecified whether with hypoxia or hypercapnia: Secondary | ICD-10-CM | POA: Diagnosis not present

## 2020-04-25 DIAGNOSIS — R4182 Altered mental status, unspecified: Secondary | ICD-10-CM | POA: Diagnosis not present

## 2020-04-25 HISTORY — PX: IR CT HEAD LTD: IMG2386

## 2020-04-25 HISTORY — PX: IR US GUIDE VASC ACCESS RIGHT: IMG2390

## 2020-04-25 HISTORY — PX: RADIOLOGY WITH ANESTHESIA: SHX6223

## 2020-04-25 HISTORY — PX: IR PERCUTANEOUS ART THROMBECTOMY/INFUSION INTRACRANIAL INC DIAG ANGIO: IMG6087

## 2020-04-25 LAB — POCT I-STAT 7, (LYTES, BLD GAS, ICA,H+H)
Acid-base deficit: 10 mmol/L — ABNORMAL HIGH (ref 0.0–2.0)
Acid-base deficit: 8 mmol/L — ABNORMAL HIGH (ref 0.0–2.0)
Bicarbonate: 18 mmol/L — ABNORMAL LOW (ref 20.0–28.0)
Bicarbonate: 17.9 mmol/L — ABNORMAL LOW (ref 20.0–28.0)
Calcium, Ion: 1.18 mmol/L (ref 1.15–1.40)
Calcium, Ion: 1.2 mmol/L (ref 1.15–1.40)
HCT: 24 % — ABNORMAL LOW (ref 39.0–52.0)
HCT: 22 % — ABNORMAL LOW (ref 39.0–52.0)
Hemoglobin: 8.2 g/dL — ABNORMAL LOW (ref 13.0–17.0)
Hemoglobin: 7.5 g/dL — ABNORMAL LOW (ref 13.0–17.0)
O2 Saturation: 92 %
O2 Saturation: 98 %
Patient temperature: 35.3
Potassium: 5.3 mmol/L — ABNORMAL HIGH (ref 3.5–5.1)
Potassium: 5.5 mmol/L — ABNORMAL HIGH (ref 3.5–5.1)
Sodium: 139 mmol/L (ref 135–145)
Sodium: 141 mmol/L (ref 135–145)
TCO2: 20 mmol/L — ABNORMAL LOW (ref 22–32)
TCO2: 19 mmol/L — ABNORMAL LOW (ref 22–32)
pCO2 arterial: 53.1 mmHg — ABNORMAL HIGH (ref 32.0–48.0)
pCO2 arterial: 36.3 mmHg (ref 32.0–48.0)
pH, Arterial: 7.139 — CL (ref 7.350–7.450)
pH, Arterial: 7.291 — ABNORMAL LOW (ref 7.350–7.450)
pO2, Arterial: 82 mmHg — ABNORMAL LOW (ref 83.0–108.0)
pO2, Arterial: 103 mmHg (ref 83.0–108.0)

## 2020-04-25 LAB — CBC
HCT: 25 % — ABNORMAL LOW (ref 39.0–52.0)
HCT: 25.3 % — ABNORMAL LOW (ref 39.0–52.0)
Hemoglobin: 8.1 g/dL — ABNORMAL LOW (ref 13.0–17.0)
Hemoglobin: 7.9 g/dL — ABNORMAL LOW (ref 13.0–17.0)
MCH: 28.5 pg (ref 26.0–34.0)
MCH: 28.3 pg (ref 26.0–34.0)
MCHC: 32.4 g/dL (ref 30.0–36.0)
MCHC: 31.2 g/dL (ref 30.0–36.0)
MCV: 88 fL (ref 80.0–100.0)
MCV: 90.7 fL (ref 80.0–100.0)
Platelets: 251 10*3/uL (ref 150–400)
Platelets: 226 10*3/uL (ref 150–400)
RBC: 2.84 MIL/uL — ABNORMAL LOW (ref 4.22–5.81)
RBC: 2.79 MIL/uL — ABNORMAL LOW (ref 4.22–5.81)
RDW: 14.2 % (ref 11.5–15.5)
RDW: 14.7 % (ref 11.5–15.5)
WBC: 16.8 10*3/uL — ABNORMAL HIGH (ref 4.0–10.5)
WBC: 16.1 10*3/uL — ABNORMAL HIGH (ref 4.0–10.5)
nRBC: 0 % (ref 0.0–0.2)
nRBC: 0.2 % (ref 0.0–0.2)

## 2020-04-25 LAB — COMPREHENSIVE METABOLIC PANEL
ALT: 28 U/L (ref 0–44)
AST: 37 U/L (ref 15–41)
Albumin: 2.4 g/dL — ABNORMAL LOW (ref 3.5–5.0)
Alkaline Phosphatase: 66 U/L (ref 38–126)
Anion gap: 15 (ref 5–15)
BUN: 84 mg/dL — ABNORMAL HIGH (ref 8–23)
CO2: 18 mmol/L — ABNORMAL LOW (ref 22–32)
Calcium: 8.5 mg/dL — ABNORMAL LOW (ref 8.9–10.3)
Chloride: 104 mmol/L (ref 98–111)
Creatinine, Ser: 3 mg/dL — ABNORMAL HIGH (ref 0.61–1.24)
GFR, Estimated: 20 mL/min — ABNORMAL LOW (ref >60)
Glucose, Bld: 76 mg/dL (ref 70–99)
Potassium: 4.8 mmol/L (ref 3.5–5.1)
Sodium: 137 mmol/L (ref 135–145)
Total Bilirubin: 0.7 mg/dL (ref 0.3–1.2)
Total Protein: 6.1 g/dL — ABNORMAL LOW (ref 6.5–8.1)

## 2020-04-25 LAB — DIFFERENTIAL
Abs Immature Granulocytes: 0.18 10*3/uL — ABNORMAL HIGH (ref 0.00–0.07)
Basophils Absolute: 0 10*3/uL (ref 0.0–0.1)
Basophils Relative: 0 %
Eosinophils Absolute: 0 10*3/uL (ref 0.0–0.5)
Eosinophils Relative: 0 %
Immature Granulocytes: 1 %
Lymphocytes Relative: 4 %
Lymphs Abs: 0.7 10*3/uL (ref 0.7–4.0)
Monocytes Absolute: 0.7 10*3/uL (ref 0.1–1.0)
Monocytes Relative: 4 %
Neutro Abs: 15.3 10*3/uL — ABNORMAL HIGH (ref 1.7–7.7)
Neutrophils Relative %: 91 %
Smear Review: NORMAL

## 2020-04-25 LAB — RESP PANEL BY RT-PCR (FLU A&B, COVID) ARPGX2
Influenza A by PCR: NEGATIVE
Influenza B by PCR: NEGATIVE
SARS Coronavirus 2 by RT PCR: NEGATIVE

## 2020-04-25 LAB — BASIC METABOLIC PANEL
Anion gap: 14 (ref 5–15)
BUN: 81 mg/dL — ABNORMAL HIGH (ref 8–23)
CO2: 19 mmol/L — ABNORMAL LOW (ref 22–32)
Calcium: 9.9 mg/dL (ref 8.9–10.3)
Chloride: 109 mmol/L (ref 98–111)
Creatinine, Ser: 3.17 mg/dL — ABNORMAL HIGH (ref 0.61–1.24)
GFR, Estimated: 19 mL/min — ABNORMAL LOW (ref >60)
Glucose, Bld: 89 mg/dL (ref 70–99)
Potassium: 5.7 mmol/L — ABNORMAL HIGH (ref 3.5–5.1)
Sodium: 142 mmol/L (ref 135–145)

## 2020-04-25 LAB — BRAIN NATRIURETIC PEPTIDE: B Natriuretic Peptide: 1653.5 pg/mL — ABNORMAL HIGH (ref 0.0–100.0)

## 2020-04-25 LAB — APTT: aPTT: 55 seconds — ABNORMAL HIGH (ref 24–36)

## 2020-04-25 LAB — GLUCOSE, CAPILLARY: Glucose-Capillary: 94 mg/dL (ref 70–99)

## 2020-04-25 LAB — MRSA PCR SCREENING: MRSA by PCR: NEGATIVE

## 2020-04-25 LAB — PROTIME-INR
INR: 2.2 — ABNORMAL HIGH (ref 0.8–1.2)
Prothrombin Time: 23.8 seconds — ABNORMAL HIGH (ref 11.4–15.2)

## 2020-04-25 LAB — PROCALCITONIN: Procalcitonin: 2.21 ng/mL

## 2020-04-25 LAB — ABO/RH: ABO/RH(D): A POS

## 2020-04-25 LAB — ETHANOL: Alcohol, Ethyl (B): <10 mg/dL (ref <10)

## 2020-04-25 IMAGING — DX DG CHEST 1V PORT
1 series · 1 of 1 positions shown · non-contrast
Comparison: Earlier same day

CLINICAL DATA: Endotracheal placement

EXAM:
PORTABLE CHEST 1 VIEW

[chest ap]
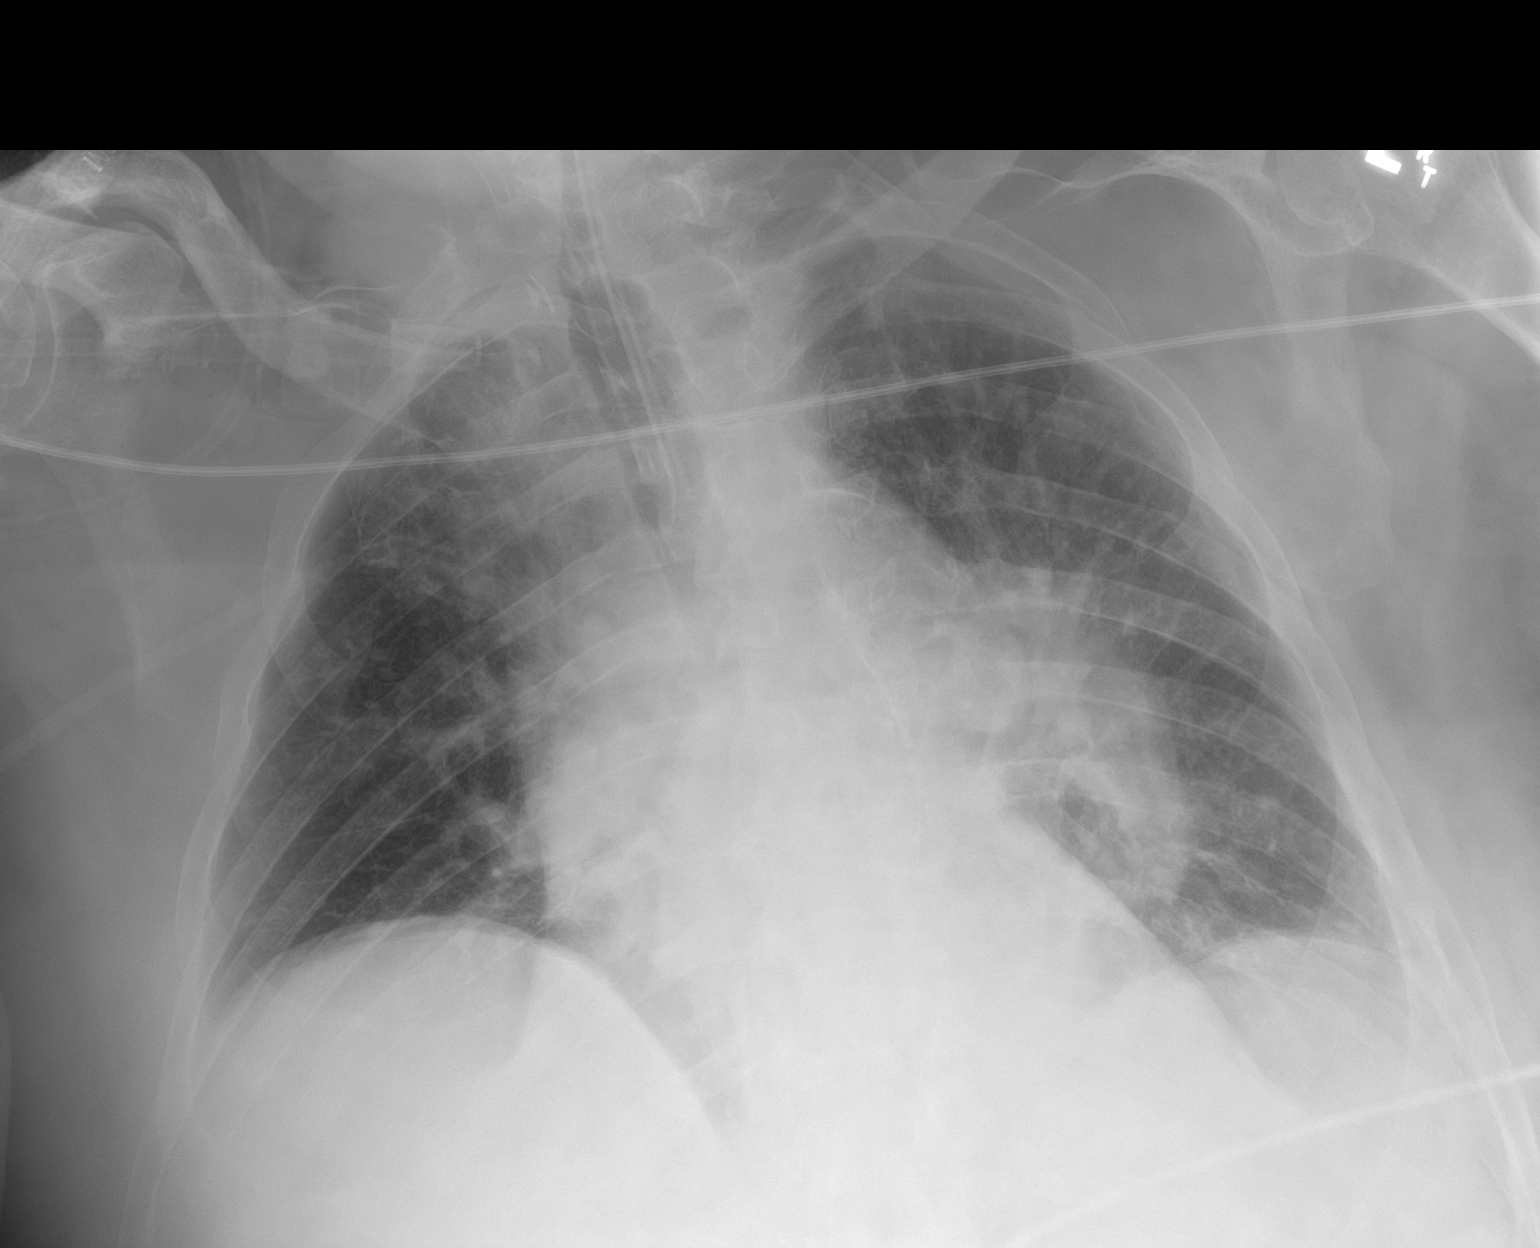

[1 of 1 positions shown; findings below may reference images not displayed]

FINDINGS: Endotracheal tube tip 4 cm above the carina. There is worsened
atelectasis in the perihilar regions both sides. Possible pulmonary
venous hypertension without frank edema. No visible effusion.
IMPRESSION: Endotracheal tube tip 4 cm above the carina. Worsened perihilar
atelectasis. Possible pulmonary venous hypertension. No frank edema.
No visible effusion or pneumothorax.

## 2020-04-25 IMAGING — DX DG CHEST 1V
1 series · 1 of 1 positions shown · non-contrast
Comparison: [DATE] chest radiograph

CLINICAL DATA: Acute shortness of breath.  CVA.

EXAM:
CHEST  1 VIEW

[chest ap]
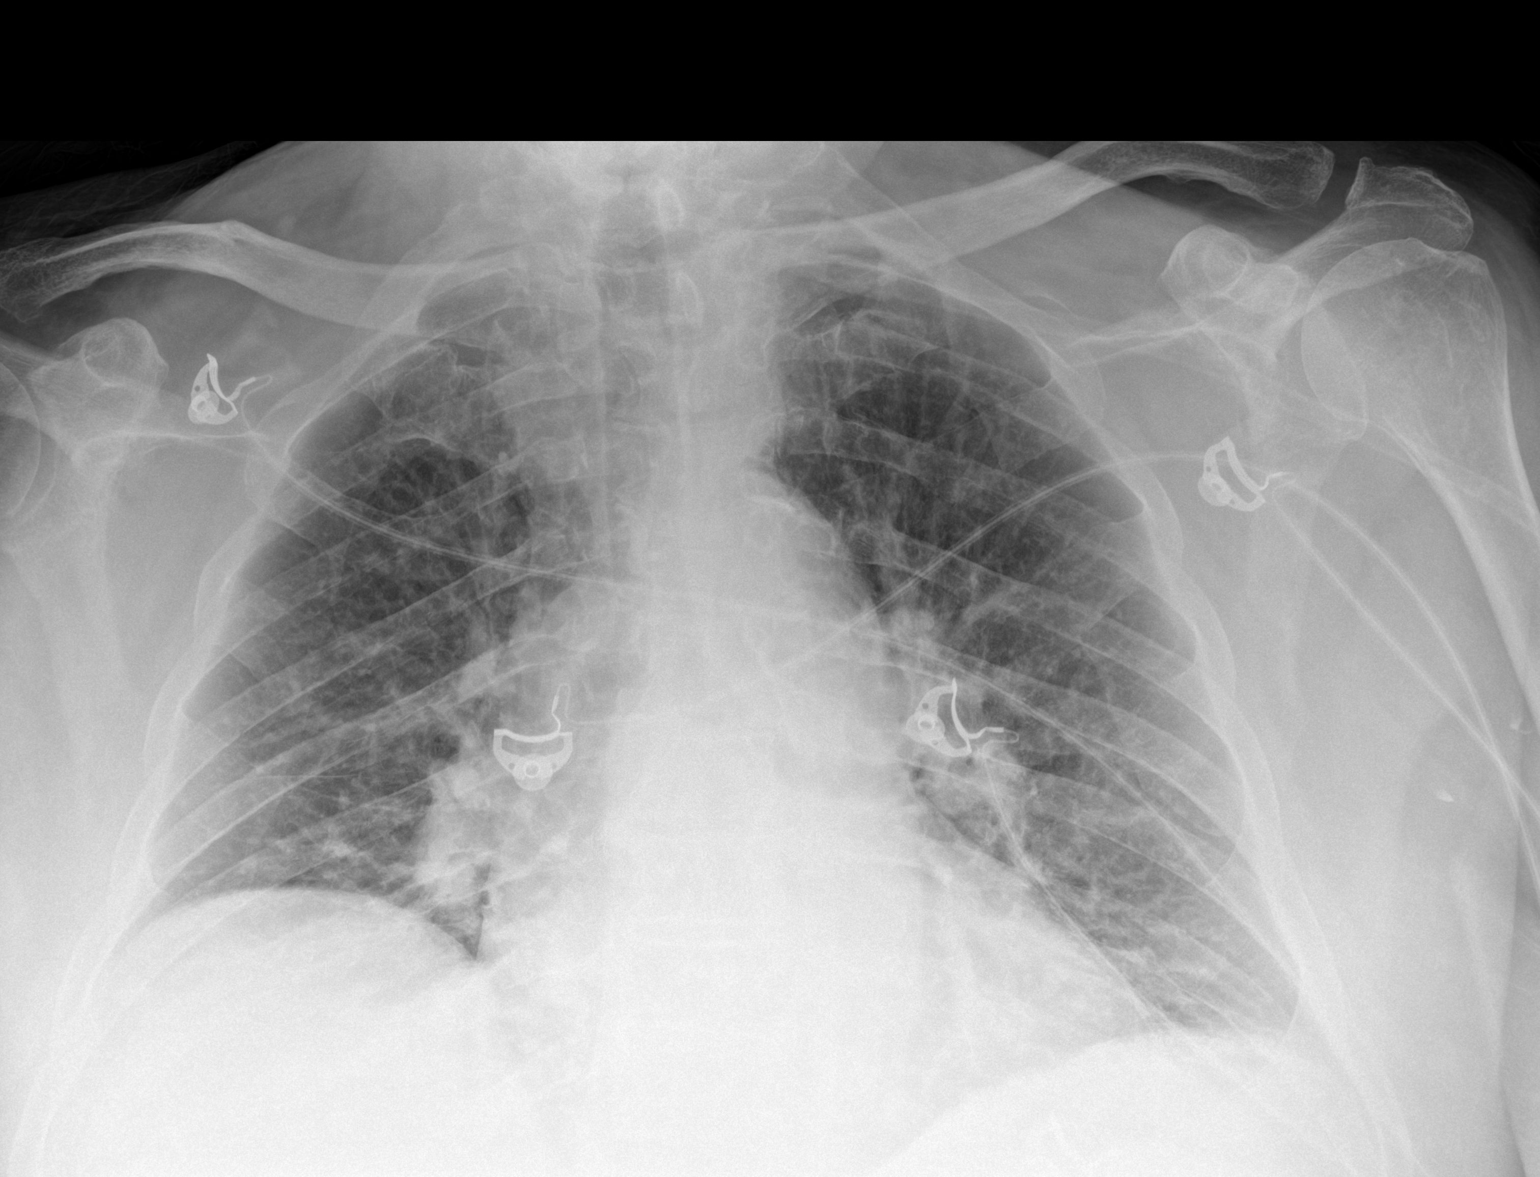

[1 of 1 positions shown; findings below may reference images not displayed]

FINDINGS: This is a low volume study.

The cardiomediastinal silhouette is unchanged.

Mild pulmonary vascular congestion noted.

There is no evidence of focal airspace disease, pulmonary edema,
suspicious pulmonary nodule/mass, pleural effusion, or pneumothorax.

No acute bony abnormalities are identified.
IMPRESSION: Mild pulmonary vascular congestion.

## 2020-04-25 IMAGING — US IR PERCUTANEOUS ART THORMBECTOMY/INFUSION INTRACRANIAL INCLUDE D
10 of 18 series · 10 of 24 positions shown · non-contrast
Comparison: CT/CT angiogram of the head and neck [DATE].

INDICATION: 79-year-old male with past medical history significant for diabetes
mellitus type 2, hypertension, hyperlipidemia and atrial
fibrillation status post cardioversion in [DATE] on Xarelto.
Patient had an episode of transient slurred speech and mild left arm
weakness on [DATE]. Around noon on [DATE], he developed
significant left arm weakness, slurred speech and left facial droop.
He was taken to outside hospital where he was found to have
diminished opacification of the right MCA branches on CTA with area
of increased T-max in the right MCA territory. He was taken to our
service for a diagnostic cerebral angiogram and possible mechanical
thrombectomy.

EXAM:
Ultrasound-guided vascular [REDACTED] cerebral angiogram
Mechanical thrombectomy
Intracranial stenting
Flat panel head CT
TECHNIQUE: Informed written consent was obtained from the patient's sister
after a thorough discussion of the procedural risks, benefits and
alternatives. All questions were addressed. Maximal Sterile Barrier
Technique was utilized including caps, mask, sterile gowns, sterile
gloves, sterile drape, hand hygiene and skin antiseptic. A timeout
was performed prior to the initiation of the procedure.

[Series 1: cerebral care 2 · 2 acquisitions, 1 frame shown (1 of 8)]
[im 1/2]
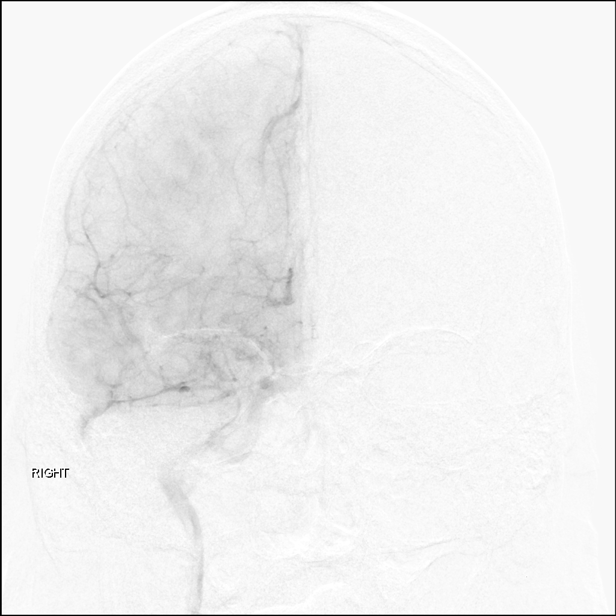

[Series 3: cerebral care 2 · 2 acquisitions, 1 frame shown (2 of 8)]
[im 1/2]
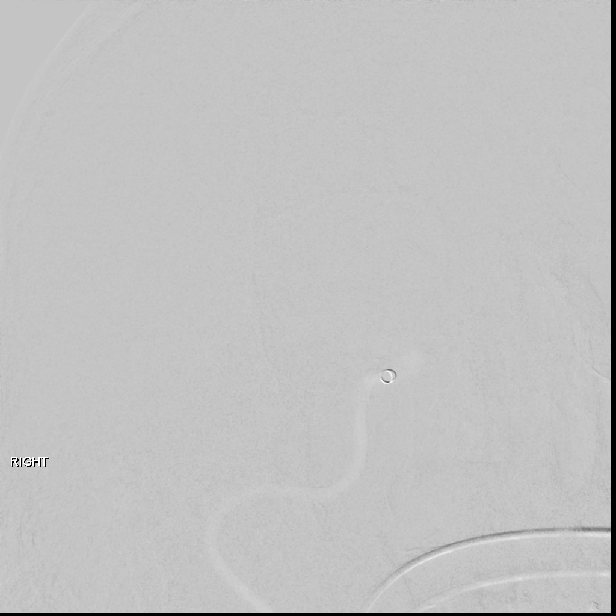

[Series 5: cerebral care 2 · 1 of 1 slices shown (3 of 8)]
[im 1/1]
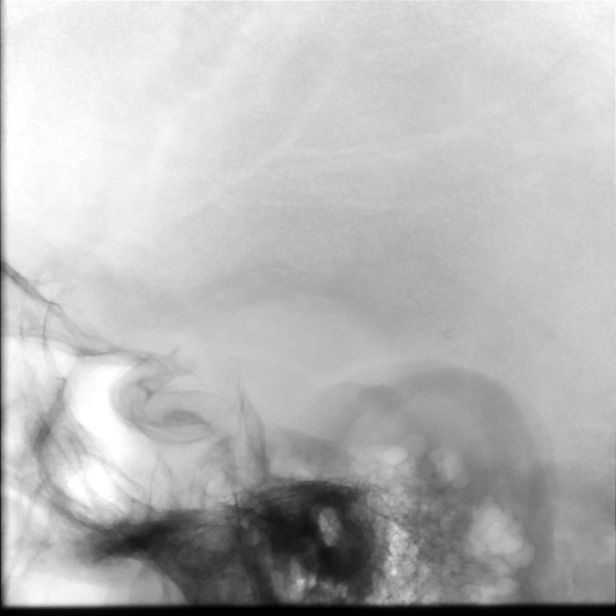

[Series 7: cerebral care 2 · 2 acquisitions, 1 frame shown (4 of 8)]
[im 1/2]
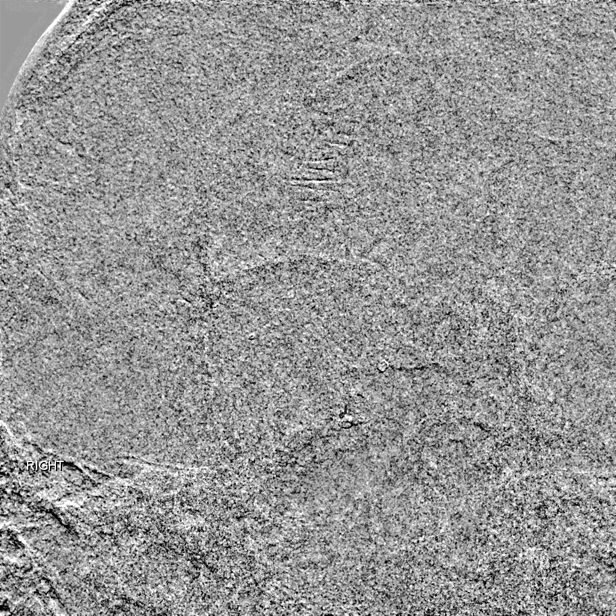

[Series 8: cerebral care 2 · 2 acquisitions, 1 frame shown (5 of 8)]
[im 1/2]
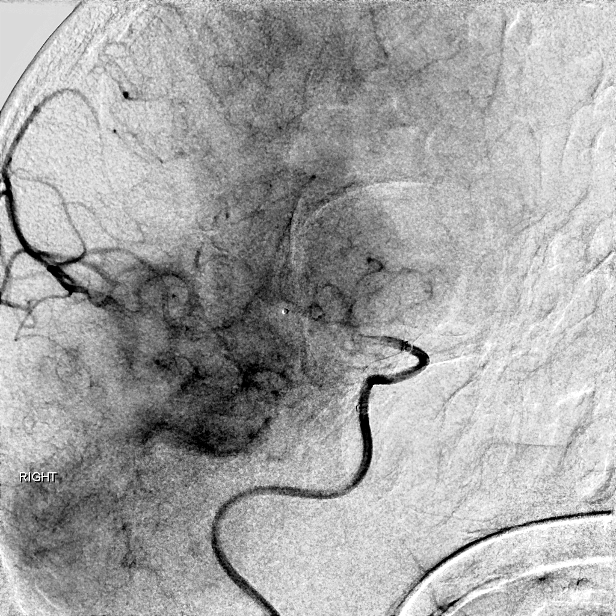

[Series 12: cerebral care 2 · 2 acquisitions, 1 frame shown (6 of 8)]
[im 1/2]
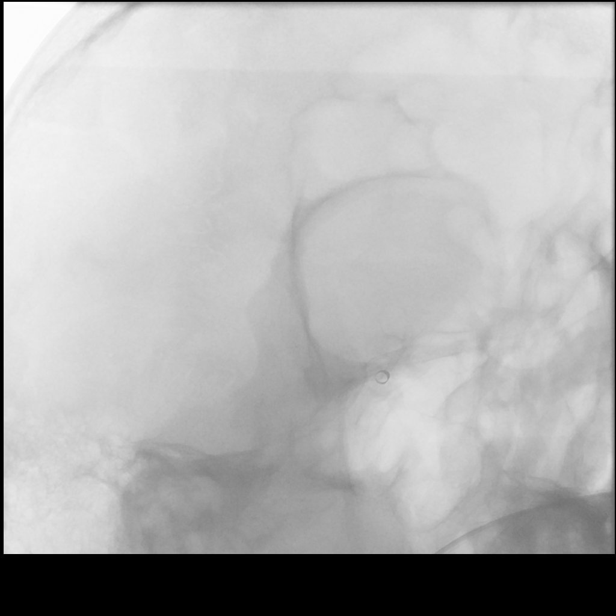

[Series 13: <mpr range>2 · coronal · 5.0mm · 0.47mm/px · 1 of 36 slices shown]
[im 36/36]
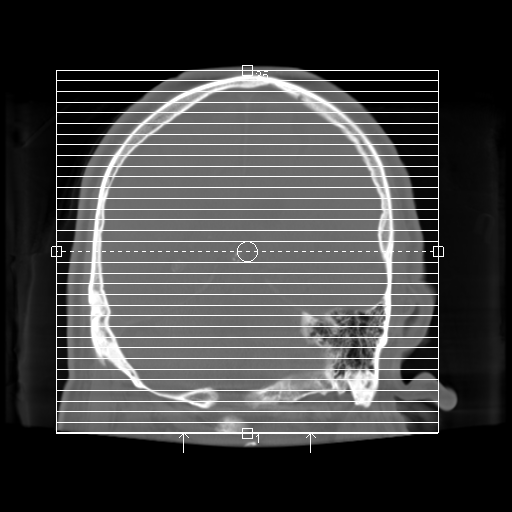

[Series 16: single · 1 of 2 slices shown]
[im 1/2]
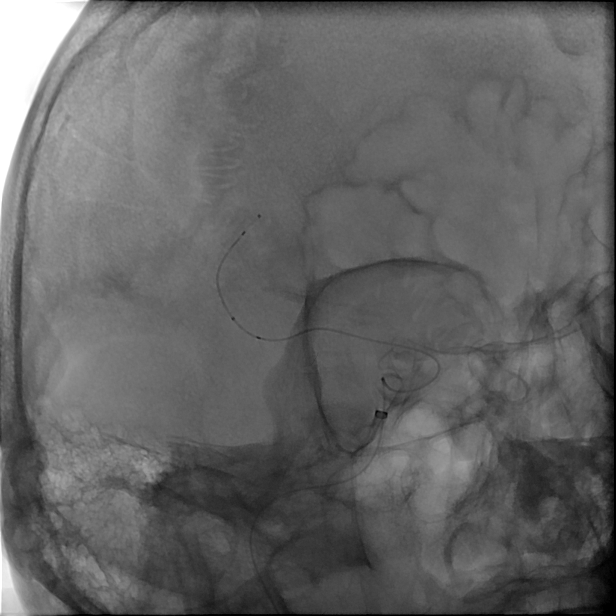

[Series 18: cerebral care 2 · 1 of 11 frames shown (7 of 8)]
[frame 2/11]
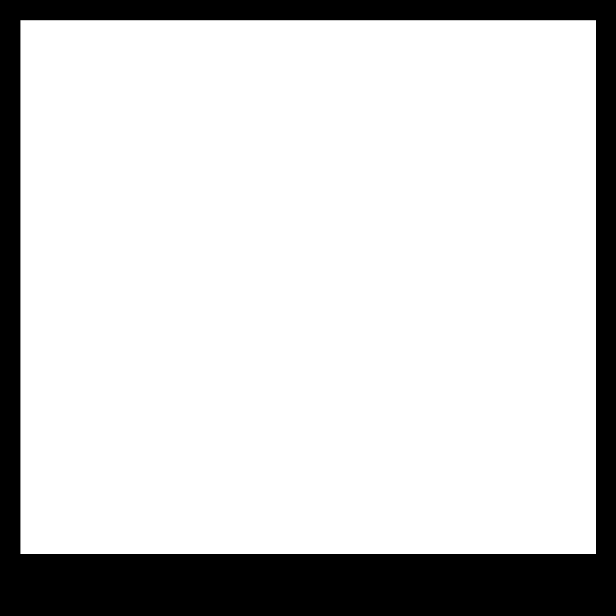

[Series 19: cerebral care 2 · 1 of 4 frames shown (8 of 8)]
[frame 3/4]
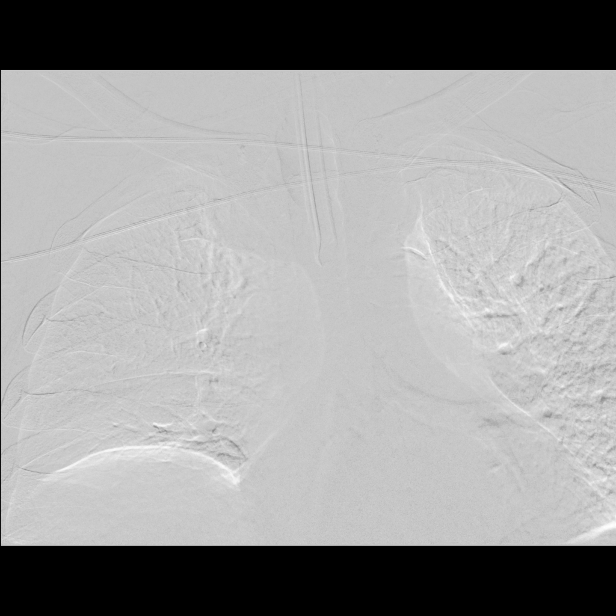

[10 of 24 positions shown; findings below may reference images not displayed]

MEDICATIONS:
Refer to anesthesia documentation.

ANESTHESIA/SEDATION:
The procedure was performed under general anesthesia.

CONTRAST:  75 mL of Omnipaque 240 milligrams/mL

FLUOROSCOPY TIME:  Fluoroscopy Time: 113 minutes 6 seconds (2,379
mGy).

COMPLICATIONS:
None immediate.
The right groin was prepped and draped in the usual sterile fashion.
Using a micropuncture kit and the modified Seldinger technique,
access was gained to the right common femoral artery and an 8 French
sheath was placed. Real-time ultrasound guidance was utilized for
vascular access including the acquisition of a permanent ultrasound
image documenting patency of the accessed vessel.

Under fluoroscopy, a 6 BIQUE 2 catheter was navigated
over a 0.035" Terumo Glidewire into the aortic arch. The catheter
was placed into the right common carotid artery and then advanced
into the right internal carotid artery. Frontal and lateral
angiograms of the head were obtained.
FINDINGS: 1. Near occlusion of a proximal right M2/MCA middle division branch
with minimal delayed contrast penetration of distal branches.
2. Atherosclerotic changes of the right carotid siphon without
hemodynamically significant stenosis.
3. Atherosclerotic changes of the right common femoral artery
without hemodynamically significant stenosis.

PROCEDURE:
Under biplane roadmap, the BIQUE 2 catheter was exchanged over
the wire for a zoom 88 guide catheter. The catheter was placed in
the upper cervical segment of the right ICA. A zoom 71 catheter was
then navigated through the guide catheter into the petrous segment
of the right ICA. Magnified frontal and lateral views of the head
were obtained. Then a phenom 21 microcatheter was navigated over a
synchro support microguidewire into the right M1/MCA. Multiple
attempts to gain access to the right M2 branch proved unsuccessful.
The synchro support micro guidewire was then exchanged for a 0.016"
headliner micro guidewire which was advanced into the distal right
M2/MCA branch. The microcatheter was then advanced over the wire.
The microwire was removed and a 4 x 40 solitaire thrombectomy device
was deployed spanning the right M2/MCA middle division branch and
distal M1 segment. The device was allowed to intercalated with the
clot for 4 minutes. The aspiration catheter was advanced to the
level of occlusion. The microcatheter was removed. The aspiration
catheter and thrombectomy device were removed under constant
aspiration.

Follow-up right ICA angiograms with magnified frontal and lateral
views of the head showed brisk opacification of the right M2/MCA
middle division branch with irregularity noted at the branch origin
([99]). Delayed follow-up angiogram showed branch reocclusion.

Under biplane roadmap, the zoom 71 aspiration catheter was again
navigated over a phenom 21 microcatheter and a headliner
microguidewire into the cavernous segment of the right ICA. The
microcatheter was then navigated over the wire into the right M2/MCA
middle division branch. Then, a 4 x 40 mm solitaire stent retriever
was deployed spanning the M2 and distal M1 segments. The device was
allowed to intercalated with the clot for 4 minutes. The
microcatheter was removed. The aspiration catheter was advanced to
the level of occlusion and connected to a penumbra aspiration pump.
The thrombectomy device and aspiration catheter were removed under
constant aspiration.

Follow-up right ICA angiograms with magnified frontal and lateral
views of the head showed brisk opacification of the right M2/MCA
middle division branch with irregularity noted at the branch origin
([99]). Delayed follow-up angiogram showed branch reocclusion.

Under biplane roadmap, the zoom 71 aspiration catheter was again
navigated over a phenom 21 microcatheter and a headliner
microguidewire into the cavernous segment of the right ICA. The
microcatheter was then navigated over the wire into the right M2/MCA
middle division branch. Then, a 5 x 37 mm embotrap stent retriever
was deployed spanning the M2 and distal M1 segments. The device was
allowed to intercalated with the clot for 4 minutes. The
microcatheter was removed. The aspiration catheter was advanced to
the level of occlusion and connected to a penumbra aspiration pump.
The thrombectomy device and aspiration catheter were removed under
constant aspiration.

Follow-up right ICA angiograms with magnified frontal and lateral
views of the head showed recanalization of the right M2/MCA middle
division branch with hairline stenosis at its origin due to
atherosclerotic plaque and stenosis at the mid M2 segment with very
delayed opacification of distal branches.

Flat panel CT of the head was obtained and post processed in a
separate workstation with concurrent attending physician
supervision. Selected images were sent to PACS. No evidence of
hemorrhagic complication noted.

Under biplane roadmap, the zoom 71 aspiration catheter was again
navigated over a phenom 21 microcatheter and a Aristotle 14
microguidewire into the cavernous segment of the right ICA. The
microcatheter was then navigated over the wire into the right M2/MCA
middle division branch. The Aristotle microguidewire was exchanged
for a an exchange length synchro microguidewire.

At this point, patient developed severe bradycardia and eventually
asystole. The microwire and microcatheter were removed. The table
was lowered to start chest compressions. However, patient had
spontaneous return of circulation before the start of resuscitation
efforts.

Follow-up right ICA angiograms with magnified frontal and lateral
views of the head again showed recanalization of the right M2/MCA
middle division branch with with hairline stenosis. No evidence of
contrast extravasation.

Under biplane roadmap, an SL 10 microcatheter was navigated over
Aristotle 14 microguidewire into the right M2/MCA middle division
branch. Subsequently, a 4.5 x 30 mm neuroform atlas stent was
deployed spanning the distal right M1, proximal and mid M2/MCA
middle division branch. Follow-up angiogram showed brisk flow
through the entire right MCA territory. The catheter construct was
subsequently withdrawn.

A right common femoral angiogram was performed in right anterior
oblique view. The puncture is at the common femoral artery level.
Atherosclerotic plaques are noted without hemodynamically
significant stenosis. The sheath was exchanged for an 8 French
Angio-Seal which was utilized for access closure. Immediate
hemostasis was achieved.
IMPRESSION: Mechanical thrombectomy and intracranial stenting performed for
treatment of a proximal right M2/MCA middle division branch near
occlusion with superimposed atherosclerotic plaque. A total of 3
passes performed with stent retriever followed by intracranial
stenting. Intracranial stenting performed after bolus infusion of
cangrelor followed by low-dose protocol drip.

PLAN:
Patient remained intubated and transferred to ICU given hemodynamic
instability.

Continue cangrelor drip until transition to aspirin and Brilinta.

Family updated by telephone immediately after the end of the
procedure.

## 2020-04-25 SURGERY — RADIOLOGY WITH ANESTHESIA
Anesthesia: General

## 2020-04-25 MED ORDER — IOHEXOL 240 MG/ML SOLN
150.0000 mL | Freq: Once | INTRAMUSCULAR | Status: AC | PRN
  Administered 2020-04-25: 75 mL via INTRA_ARTERIAL

## 2020-04-25 MED ORDER — CANGRELOR TETRASODIUM 50 MG IV SOLR
INTRAVENOUS | Status: AC
  Filled 2020-04-25: qty 50

## 2020-04-25 MED ORDER — CANGRELOR BOLUS VIA INFUSION
30.0000 ug/kg | Freq: Once | INTRAVENOUS | Status: AC
  Administered 2020-04-25: 3390 ug via INTRAVENOUS

## 2020-04-25 MED ORDER — SODIUM BICARBONATE 8.4 % IV SOLN
100.0000 meq | Freq: Once | INTRAVENOUS | Status: AC
  Administered 2020-04-25: 100 meq via INTRAVENOUS

## 2020-04-25 MED ORDER — CHLORHEXIDINE GLUCONATE 0.12% ORAL RINSE (MEDLINE KIT)
15.0000 mL | Freq: Two times a day (BID) | OROMUCOSAL | Status: DC
  Administered 2020-04-25: 15 mL via OROMUCOSAL

## 2020-04-25 MED ORDER — TICAGRELOR 90 MG PO TABS
ORAL_TABLET | ORAL | Status: AC
  Filled 2020-04-25: qty 2

## 2020-04-25 MED ORDER — SODIUM CHLORIDE 0.9 % IV SOLN: INTRAVENOUS | Status: DC

## 2020-04-25 MED ORDER — VASOPRESSIN 20 UNIT/ML IV SOLN
INTRAVENOUS | Status: AC
  Filled 2020-04-25: qty 1

## 2020-04-25 MED ORDER — ALBUMIN HUMAN 5 % IV SOLN: INTRAVENOUS | Status: DC | PRN

## 2020-04-25 MED ORDER — PANTOPRAZOLE SODIUM 40 MG IV SOLR: 40.0000 mg | Freq: Every day | INTRAVENOUS | Status: DC

## 2020-04-25 MED ORDER — SODIUM BICARBONATE 8.4 % IV SOLN
INTRAVENOUS | Status: AC
  Filled 2020-04-25: qty 50

## 2020-04-25 MED ORDER — PROPOFOL 10 MG/ML IV BOLUS
INTRAVENOUS | Status: DC | PRN
  Administered 2020-04-25: 100 mg via INTRAVENOUS

## 2020-04-25 MED ORDER — PHENYLEPHRINE HCL-NACL 10-0.9 MG/250ML-% IV SOLN
INTRAVENOUS | Status: DC | PRN
  Administered 2020-04-25: 50 ug/min via INTRAVENOUS

## 2020-04-25 MED ORDER — ACETAMINOPHEN 325 MG PO TABS: 650.0000 mg | ORAL_TABLET | ORAL | Status: DC | PRN

## 2020-04-25 MED ORDER — ACETAMINOPHEN 160 MG/5ML PO SOLN: 650.0000 mg | ORAL | Status: DC | PRN

## 2020-04-25 MED ORDER — LACTATED RINGERS IV SOLN: INTRAVENOUS | Status: DC

## 2020-04-25 MED ORDER — EPTIFIBATIDE 20 MG/10ML IV SOLN
INTRAVENOUS | Status: AC
  Filled 2020-04-25: qty 10

## 2020-04-25 MED ORDER — PHENYLEPHRINE HCL-NACL 10-0.9 MG/250ML-% IV SOLN
INTRAVENOUS | Status: AC
  Administered 2020-04-25: 250 ug/min
  Filled 2020-04-25: qty 250

## 2020-04-25 MED ORDER — SODIUM BICARBONATE 8.4 % IV SOLN: 50.0000 meq | Freq: Once | INTRAVENOUS | Status: AC

## 2020-04-25 MED ORDER — LACTATED RINGERS IV SOLN: INTRAVENOUS | Status: DC | PRN

## 2020-04-25 MED ORDER — FENTANYL CITRATE (PF) 100 MCG/2ML IJ SOLN
INTRAMUSCULAR | Status: AC
  Filled 2020-04-25: qty 2

## 2020-04-25 MED ORDER — FENTANYL CITRATE (PF) 100 MCG/2ML IJ SOLN
INTRAMUSCULAR | Status: DC | PRN
  Administered 2020-04-25: 100 ug via INTRAVENOUS

## 2020-04-25 MED ORDER — ACETAMINOPHEN 325 MG PO TABS
650.0000 mg | ORAL_TABLET | ORAL | Status: DC | PRN
  Filled 2020-04-25: qty 2

## 2020-04-25 MED ORDER — SODIUM CHLORIDE 0.9 % IV SOLN
250.0000 mL | INTRAVENOUS | Status: DC
  Administered 2020-04-25 – 2020-04-28 (×2): 250 mL via INTRAVENOUS

## 2020-04-25 MED ORDER — POLYETHYLENE GLYCOL 3350 17 G PO PACK
17.0000 g | PACK | Freq: Every day | ORAL | Status: DC
  Administered 2020-04-26 – 2020-04-28 (×3): 17 g
  Filled 2020-04-25 (×3): qty 1

## 2020-04-25 MED ORDER — SENNOSIDES-DOCUSATE SODIUM 8.6-50 MG PO TABS: 1.0000 | ORAL_TABLET | Freq: Every evening | ORAL | Status: DC | PRN

## 2020-04-25 MED ORDER — ACETAMINOPHEN 160 MG/5ML PO SOLN
650.0000 mg | ORAL | Status: DC | PRN
  Administered 2020-04-27 – 2020-04-28 (×6): 650 mg
  Filled 2020-04-25 (×6): qty 20.3

## 2020-04-25 MED ORDER — ROCURONIUM BROMIDE 10 MG/ML (PF) SYRINGE
PREFILLED_SYRINGE | INTRAVENOUS | Status: DC | PRN
  Administered 2020-04-25: 20 mg via INTRAVENOUS
  Administered 2020-04-25: 10 mg via INTRAVENOUS
  Administered 2020-04-25: 40 mg via INTRAVENOUS

## 2020-04-25 MED ORDER — SODIUM CHLORIDE 0.9 % IV SOLN
2.0000 ug/kg/min | INTRAVENOUS | Status: DC
  Administered 2020-04-25 – 2020-04-26 (×3): 2 ug/kg/min via INTRAVENOUS
  Filled 2020-04-25 (×2): qty 50

## 2020-04-25 MED ORDER — SODIUM BICARBONATE 8.4 % IV SOLN
INTRAVENOUS | Status: AC
  Administered 2020-04-25: 50 meq via INTRAVENOUS
  Filled 2020-04-25: qty 100

## 2020-04-25 MED ORDER — ORAL CARE MOUTH RINSE
15.0000 mL | OROMUCOSAL | Status: DC
  Administered 2020-04-25 – 2020-05-02 (×65): 15 mL via OROMUCOSAL

## 2020-04-25 MED ORDER — ORAL CARE MOUTH RINSE
15.0000 mL | OROMUCOSAL | Status: DC
  Administered 2020-04-25: 15 mL via OROMUCOSAL

## 2020-04-25 MED ORDER — ALBUTEROL SULFATE HFA 108 (90 BASE) MCG/ACT IN AERS
INHALATION_SPRAY | RESPIRATORY_TRACT | Status: DC | PRN
  Administered 2020-04-25 (×2): 4 via RESPIRATORY_TRACT

## 2020-04-25 MED ORDER — INSULIN ASPART 100 UNIT/ML ~~LOC~~ SOLN
0.0000 [IU] | SUBCUTANEOUS | Status: DC
  Administered 2020-04-26: 2 [IU] via SUBCUTANEOUS
  Administered 2020-04-27: 3 [IU] via SUBCUTANEOUS
  Administered 2020-04-27: 2 [IU] via SUBCUTANEOUS
  Administered 2020-04-27: 3 [IU] via SUBCUTANEOUS
  Administered 2020-04-27: 2 [IU] via SUBCUTANEOUS
  Administered 2020-04-28: 3 [IU] via SUBCUTANEOUS
  Administered 2020-04-28: 5 [IU] via SUBCUTANEOUS
  Administered 2020-04-28: 3 [IU] via SUBCUTANEOUS
  Administered 2020-04-28: 5 [IU] via SUBCUTANEOUS
  Administered 2020-04-28: 3 [IU] via SUBCUTANEOUS
  Administered 2020-04-28: 8 [IU] via SUBCUTANEOUS
  Administered 2020-04-29 – 2020-05-01 (×4): 2 [IU] via SUBCUTANEOUS
  Administered 2020-05-01 (×2): 3 [IU] via SUBCUTANEOUS

## 2020-04-25 MED ORDER — CHLORHEXIDINE GLUCONATE 0.12% ORAL RINSE (MEDLINE KIT)
15.0000 mL | Freq: Two times a day (BID) | OROMUCOSAL | Status: DC
  Administered 2020-04-25 – 2020-05-02 (×14): 15 mL via OROMUCOSAL

## 2020-04-25 MED ORDER — ATROPINE SULFATE 0.4 MG/ML IV SOSY
PREFILLED_SYRINGE | INTRAVENOUS | Status: DC | PRN
  Administered 2020-04-25: .4 mg via INTRAVENOUS

## 2020-04-25 MED ORDER — FENTANYL CITRATE (PF) 100 MCG/2ML IJ SOLN: 25.0000 ug | INTRAMUSCULAR | Status: DC | PRN

## 2020-04-25 MED ORDER — ASPIRIN 81 MG PO CHEW
CHEWABLE_TABLET | ORAL | Status: AC
  Filled 2020-04-25: qty 1

## 2020-04-25 MED ORDER — IOHEXOL 350 MG/ML SOLN
75.0000 mL | Freq: Once | INTRAVENOUS | Status: AC | PRN
  Administered 2020-04-25: 75 mL via INTRAVENOUS

## 2020-04-25 MED ORDER — IOHEXOL 240 MG/ML SOLN
INTRAMUSCULAR | Status: AC
  Filled 2020-04-25: qty 200

## 2020-04-25 MED ORDER — CALCIUM CHLORIDE 10 % IV SOLN
INTRAVENOUS | Status: DC | PRN
  Administered 2020-04-25: 1 g via INTRAVENOUS

## 2020-04-25 MED ORDER — CLOPIDOGREL BISULFATE 300 MG PO TABS
ORAL_TABLET | ORAL | Status: AC
  Filled 2020-04-25: qty 1

## 2020-04-25 MED ORDER — LIDOCAINE 2% (20 MG/ML) 5 ML SYRINGE
INTRAMUSCULAR | Status: DC | PRN
  Administered 2020-04-25: 60 mg via INTRAVENOUS

## 2020-04-25 MED ORDER — EPHEDRINE SULFATE 50 MG/ML IJ SOLN
INTRAMUSCULAR | Status: DC | PRN
  Administered 2020-04-25 (×2): 10 mg via INTRAVENOUS

## 2020-04-25 MED ORDER — PHENYLEPHRINE HCL-NACL 10-0.9 MG/250ML-% IV SOLN
INTRAVENOUS | Status: AC
  Administered 2020-04-25: 150 ug/min via INTRAVENOUS
  Filled 2020-04-25: qty 250

## 2020-04-25 MED ORDER — VASOPRESSIN 20 UNIT/ML IV SOLN
INTRAVENOUS | Status: DC | PRN
  Administered 2020-04-25 (×2): 2 [IU] via INTRAVENOUS
  Administered 2020-04-25: 1 [IU] via INTRAVENOUS
  Administered 2020-04-25: 3 [IU] via INTRAVENOUS
  Administered 2020-04-25 (×5): 1 [IU] via INTRAVENOUS
  Administered 2020-04-25: 2 [IU] via INTRAVENOUS
  Administered 2020-04-25: 1 [IU] via INTRAVENOUS
  Administered 2020-04-25: 2 [IU] via INTRAVENOUS
  Administered 2020-04-25 (×2): 1 [IU] via INTRAVENOUS

## 2020-04-25 MED ORDER — TIROFIBAN HCL IN NACL 5-0.9 MG/100ML-% IV SOLN
INTRAVENOUS | Status: AC
  Filled 2020-04-25: qty 100

## 2020-04-25 MED ORDER — STROKE: EARLY STAGES OF RECOVERY BOOK: Freq: Once | Status: DC

## 2020-04-25 MED ORDER — PROPOFOL 500 MG/50ML IV EMUL
INTRAVENOUS | Status: DC | PRN
  Administered 2020-04-25: 25 ug/kg/min via INTRAVENOUS

## 2020-04-25 MED ORDER — PHENYLEPHRINE HCL-NACL 10-0.9 MG/250ML-% IV SOLN
25.0000 ug/min | INTRAVENOUS | Status: DC
  Administered 2020-04-25: 150 ug/min via INTRAVENOUS
  Administered 2020-04-25: 200 ug/min via INTRAVENOUS
  Administered 2020-04-25 – 2020-04-26 (×2): 150 ug/min via INTRAVENOUS
  Administered 2020-04-26: 20 ug/min via INTRAVENOUS
  Administered 2020-04-26: 50 ug/min via INTRAVENOUS
  Administered 2020-04-26: 150 ug/min via INTRAVENOUS
  Administered 2020-04-27: 25 ug/min via INTRAVENOUS
  Filled 2020-04-25: qty 500
  Filled 2020-04-25: qty 250
  Filled 2020-04-25 (×2): qty 500

## 2020-04-25 MED ORDER — CHLORHEXIDINE GLUCONATE CLOTH 2 % EX PADS
6.0000 | MEDICATED_PAD | Freq: Every day | CUTANEOUS | Status: DC
  Administered 2020-04-26 – 2020-04-27 (×2): 6 via TOPICAL

## 2020-04-25 MED ORDER — DEXTROSE 50 % IV SOLN
INTRAVENOUS | Status: DC | PRN
  Administered 2020-04-25 (×2): 25 g via INTRAVENOUS

## 2020-04-25 MED ORDER — VERAPAMIL HCL 2.5 MG/ML IV SOLN
INTRAVENOUS | Status: AC
  Filled 2020-04-25: qty 2

## 2020-04-25 MED ORDER — ENOXAPARIN SODIUM 40 MG/0.4ML ~~LOC~~ SOLN: 40.0000 mg | SUBCUTANEOUS | Status: DC

## 2020-04-25 MED ORDER — FENTANYL CITRATE (PF) 100 MCG/2ML IJ SOLN
25.0000 ug | INTRAMUSCULAR | Status: DC | PRN
  Administered 2020-04-28: 50 ug via INTRAVENOUS
  Filled 2020-04-25 (×3): qty 2

## 2020-04-25 MED ORDER — PROPOFOL 1000 MG/100ML IV EMUL
0.0000 ug/kg/min | INTRAVENOUS | Status: DC
  Administered 2020-04-26: 30 ug/kg/min via INTRAVENOUS
  Administered 2020-04-26: 15 ug/kg/min via INTRAVENOUS
  Administered 2020-04-26 (×2): 30 ug/kg/min via INTRAVENOUS
  Filled 2020-04-25 (×4): qty 100

## 2020-04-25 MED ORDER — DOCUSATE SODIUM 50 MG/5ML PO LIQD
100.0000 mg | Freq: Two times a day (BID) | ORAL | Status: DC
  Administered 2020-04-26 – 2020-04-28 (×4): 100 mg
  Filled 2020-04-25 (×5): qty 10

## 2020-04-25 MED ORDER — PANTOPRAZOLE SODIUM 40 MG IV SOLR
40.0000 mg | INTRAVENOUS | Status: DC
  Administered 2020-04-25: 40 mg via INTRAVENOUS
  Filled 2020-04-25: qty 40

## 2020-04-25 MED ORDER — SODIUM CHLORIDE 0.9 % IV SOLN: INTRAVENOUS | Status: DC | PRN

## 2020-04-25 MED ORDER — LACTATED RINGERS IV BOLUS
1000.0000 mL | Freq: Once | INTRAVENOUS | Status: AC
  Administered 2020-04-25: 1000 mL via INTRAVENOUS

## 2020-04-25 MED ORDER — SUCCINYLCHOLINE CHLORIDE 20 MG/ML IJ SOLN
INTRAMUSCULAR | Status: DC | PRN
  Administered 2020-04-25: 120 mg via INTRAVENOUS

## 2020-04-25 MED ORDER — ACETAMINOPHEN 650 MG RE SUPP: 650.0000 mg | RECTAL | Status: DC | PRN

## 2020-04-25 MED ORDER — CALCIUM GLUCONATE-NACL 1-0.675 GM/50ML-% IV SOLN
1.0000 g | Freq: Once | INTRAVENOUS | Status: AC
  Administered 2020-04-25: 1000 mg via INTRAVENOUS
  Filled 2020-04-25: qty 50

## 2020-04-25 MED ORDER — CALCIUM CHLORIDE 10 % IV SOLN
INTRAVENOUS | Status: AC
  Administered 2020-04-25: 1000 mg
  Filled 2020-04-25: qty 10

## 2020-04-25 MED ORDER — DEXTROSE 50 % IV SOLN
INTRAVENOUS | Status: AC
  Filled 2020-04-25: qty 50

## 2020-04-25 MED ORDER — STROKE: EARLY STAGES OF RECOVERY BOOK: Freq: Once | Status: AC

## 2020-04-25 MED ORDER — INSULIN ASPART 100 UNIT/ML ~~LOC~~ SOLN
SUBCUTANEOUS | Status: DC | PRN
  Administered 2020-04-25: 5 [IU] via INTRAVENOUS

## 2020-04-25 MED ORDER — SODIUM BICARBONATE 8.4 % IV SOLN
INTRAVENOUS | Status: DC | PRN
  Administered 2020-04-25 (×2): 50 meq via INTRAVENOUS

## 2020-04-25 NOTE — Sedation Documentation (Signed)
Notified bed control of active code stroke in IR. [facility] working on bed assignment at this time

## 2020-04-25 NOTE — Transfer of Care (Signed)
Immediate Anesthesia Transfer of Care Note  Patient: Robert Daniel  Procedure(s) Performed: RADIOLOGY WITH ANESTHESIA (N/A )  Patient Location: ICU  Anesthesia Type:General  Level of Consciousness: sedated, unresponsive and Patient remains intubated per anesthesia plan  Airway & Oxygen Therapy: Patient remains intubated per anesthesia plan and Patient placed on Ventilator (see vital sign flow sheet for setting)  Post-op Assessment: Report given to RN and Post -op Vital signs reviewed and stable  Post vital signs: Reviewed and stable  Last Vitals:  Vitals Value Taken Time  BP 102/65 04/07/2020 1915  Temp 35 C 04/19/2020 1922  Pulse 95 04/27/2020 1922  Resp 20 04/16/2020 1922  SpO2 95 % 04/21/2020 1922  Vitals shown include unvalidated device data.  Last Pain:  Vitals:   04/21/2020 1905  TempSrc: Bladder         Complications: No complications documented.

## 2020-04-25 NOTE — Progress Notes (Signed)
CODE STROKE- PHARMACY COMMUNICATION   Time CODE STROKE called/page received: 1336  Time response to CODE STROKE was made (in person or via phone): 1338  Time Stroke Kit retrieved from [place] (only if needed): No tPA   Past Medical History:  Diagnosis Date  . Hyperlipidemia 02/16/2017  . Hypertension    Prior to Admission medications   Medication Sig Start Date End Date Taking? Authorizing Provider  acetaminophen (TYLENOL) 500 MG tablet Take 500 mg by mouth every 6 (six) hours as needed.    [provider]  Ascorbic Acid (VITAMIN C) 1000 MG tablet Take 1,000 mg by mouth daily.    [provider]  aspirin EC 81 MG tablet Take 81 mg by mouth daily.    [provider]  atorvastatin (LIPITOR) 80 MG tablet Take 1 tablet (80 mg total) by mouth daily. 12/19/19 03/18/20  Marrianne Mood D, PA-C  benazepril (LOTENSIN) 40 MG tablet Take 1 tablet by mouth once daily 11/25/19   Loel Dubonnet, NP  Cinnamon 500 MG TABS Take 1,000 mg by mouth daily.    [provider]  cloNIDine (CATAPRES) 0.2 MG tablet Take 1 tablet by mouth 4 times daily 01/28/20   Parks Ranger, Devonne Doughty, DO  co-enzyme Q-10 30 MG capsule Take 30 mg by mouth 3 (three) times daily.    [provider]  felodipine (PLENDIL) 10 MG 24 hr tablet Take 1 tablet (10 mg total) by mouth daily. 03/12/19   Minna Merritts, MD  gemfibrozil (LOPID) 600 MG tablet TAKE 1 TABLET BY MOUTH TWICE DAILY BEFORE A MEAL 07/30/19   Karamalegos, Devonne Doughty, DO  glimepiride (AMARYL) 4 MG tablet Take 1 tablet (4 mg total) by mouth daily with breakfast. 03/26/20   Karamalegos, Devonne Doughty, DO  hydrALAZINE (APRESOLINE) 100 MG tablet TAKE 1 TABLET BY MOUTH IN THE MORNING, AT NOON, IN THE EVENING, AND AT BEDTIME 02/25/20   Loel Dubonnet, NP  ibuprofen (ADVIL,MOTRIN) 200 MG tablet Take 800 mg by mouth every 6 (six) hours as needed.    [provider]  isosorbide mononitrate (IMDUR) 60 MG 24 hr tablet Take 1  tablet by mouth twice daily 02/24/20   Loel Dubonnet, NP  labetalol (NORMODYNE) 100 MG tablet Take 1 tablet (100 mg total) by mouth 2 (two) times daily. Take 1 tablet (100 mg total) by mouth 2 (two) times daily. May take an additional half tablet as needed for systolic blood pressure >128. 05/09/19   Loel Dubonnet, NP  metFORMIN (GLUCOPHAGE) 1000 MG tablet TAKE 1 TABLET BY MOUTH TWICE DAILY WITH A MEAL 01/28/20   Karamalegos, Devonne Doughty, DO  Multiple Vitamin (MULTIVITAMIN) tablet Take 1 tablet by mouth daily.    [provider]  nitroGLYCERIN (NITROSTAT) 0.4 MG SL tablet DISSOLVE ONE TABLET UNDER THE TONGUE EVERY 5 MINUTES AS NEEDED FOR CHEST PAIN.  DO NOT EXCEED A TOTAL OF 3 DOSES IN 15 MINUTES 12/31/19   Gollan, Kathlene November, MD  Omega-3 Fatty Acids (OMEGA-3 FISH OIL) 1200 MG CAPS Take 2,000 mg by mouth 2 (two) times daily with a meal.    [provider]  rivaroxaban (XARELTO) 20 MG TABS tablet Take 1 tablet (20 mg total) by mouth daily with supper. 03/26/20   Minna Merritts, MD  torsemide (DEMADEX) 20 MG tablet Take 1 tablet (20 mg total) by mouth every other day. 10/10/19 01/08/20  Marrianne Mood D, PA-C  Turmeric 500 MG TABS Take by mouth daily.  [provider]    Sherilyn Banker, PharmD Pharmacy Resident  04/24/2020 1:59 PM

## 2020-04-25 NOTE — Consult Note (Signed)
NEUROLOGY CONSULTATION NOTE   Date of service: April 25, 2020 Patient Name: Robert Daniel MRN:  [id number] DOB:  26-Jan-1941 Reason for consult: "Stroke code" _ _ _   _ __   _ __ _ _  __ __   _ __   __ _  History of Present Illness  Isaish Jackovich is a 80 y.o. male with PMH significant for HTN, HLD, Afibb on Xarelto, who presents with left sided weakness, dysarthria, speaking in 1 word or short phrases only and left facial droop with a LKW of 1200 on 04/07/2020. He woke up at 0400 this AM and was absolutely fine.  Reports that 2 weeks ago, he had R leg weakness but it was not too bad so he did not do anythign about it. Did not see a doctor. Takes his Xarelto daily.  On speaking with patient's sister on the phone, she reports that he posted something about this at 0930 on facebook today. Patient is adamant that his symptoms started at 1200 today.  NIHSS components Score: Comment  1a Level of Conscious 0'[x]'$  1'[]'$  2'[]'$  3'[]'$      1b LOC Questions 0'[x]'$  1'[]'$  2'[]'$       1c LOC Commands 0'[x]'$  1'[]'$  2'[]'$       2 Best Gaze 0'[x]'$  1'[]'$  2'[]'$       3 Visual 0'[x]'$  1'[]'$  2'[]'$  3'[]'$      4 Facial Palsy 0'[]'$  1'[]'$  2'[x]'$  3'[]'$      5a Motor Arm - left 0'[x]'$  1'[]'$  2'[]'$  3'[]'$  4'[]'$  UN'[]'$    5b Motor Arm - Right 0'[x]'$  1'[]'$  2'[]'$  3'[]'$  4'[]'$  UN'[]'$    6a Motor Leg - Left 0'[x]'$  1'[]'$  2'[]'$  3'[]'$  4'[]'$  UN'[]'$    6b Motor Leg - Right 0'[]'$  1'[x]'$  2'[]'$  3'[]'$  4'[]'$  UN'[]'$    7 Limb Ataxia 0'[x]'$  1'[]'$  2'[]'$  3'[]'$  UN'[]'$     8 Sensory 0'[x]'$  1'[]'$  2'[]'$  UN'[]'$      9 Best Language 0'[x]'$  1'[]'$  2'[]'$  3'[]'$    Speaks in 1 word or short phrases only  10 Dysarthria 0'[]'$  1'[]'$  2'[x]'$  UN'[]'$      11 Extinct. and Inattention 0'[x]'$  1'[]'$  2'[]'$       TOTAL: 5    MRS: 0(Lives by himself, takes care of his groceries and finances) TPA: No, Took Xarelto within 24 hours of presentation. Thrombectomy: Going for emergent angiogram, may need thrombectomy if the Angio shows a thrombus.   ROS   Constitutional Denies weight loss, fever and chills.  HEENT Denies changes in vision and hearing.  Respiratory Denies SOB and cough.  CV Denies  palpitations and CP  GI Denies abdominal pain, nausea, vomiting and diarrhea.  GU Denies dysuria and urinary frequency.  MSK Denies myalgia and joint pain.   Skin Denies rash and pruritus.   Neurological Denies headache and syncope.   Psychiatric Denies recent changes in mood. Denies anxiety and depression.    Past History   Past Medical History:  Diagnosis Date  . Hyperlipidemia 02/16/2017  . Hypertension    Past Surgical History:  Procedure Laterality Date  . CARDIAC CATHETERIZATION  2013  . CARDIOVERSION N/A 11/29/2019   Procedure: CARDIOVERSION;  Surgeon: Minna Merritts, MD;  Location: [facility] ORS;  Service: Cardiovascular;  Laterality: N/A;  . CATARACT EXTRACTION Bilateral 04/21/2008   R eye 05-12-08   No family history on file. Social History   Socioeconomic History  . Marital status: Divorced    Spouse name: Not on file  . Number of children: Not on file  . Years of education: Graduate  . Highest education level: Not on  file  Occupational History  . Not on file  Tobacco Use  . Smoking status: Never Smoker  . Smokeless tobacco: Never Used  Vaping Use  . Vaping Use: Never used  Substance and Sexual Activity  . Alcohol use: No    Comment: wine occasionaly   . Drug use: No  . Sexual activity: Not on file  Other Topics Concern  . Not on file  Social History Narrative   Works part time Multimedia programmer in fall and spring      [employer]    Social Determinants of Health   Financial Resource Strain: [place]   . Difficulty of Paying Living Expenses: Not hard at all  Food Insecurity: No Food Insecurity  . Worried About Charity fundraiser in the Last Year: Never true  . Ran Out of Food in the Last Year: Never true  Transportation Needs: No Transportation Needs  . Lack of Transportation (Medical): No  . Lack of Transportation (Non-Medical): No  Physical Activity: Inactive  . Days of Exercise per Week: 0 days  . Minutes of Exercise per Session: 0 min   Stress: No Stress Concern Present  . Feeling of Stress : Not at all  Social Connections: Not on file   No Known Allergies  Medications  (Not in a hospital admission)    Vitals   Vitals:   04/30/2020 1332  Weight: 113 kg  Height: '6\' 3"'$  (1.905 m)     Body mass index is 31.14 kg/m.  Physical Exam   General: Laying comfortably in bed; in no acute distress. HENT: Normal oropharynx and mucosa. Normal external appearance of ears and nose. Neck: Supple, no pain or tenderness CV: No JVD. No peripheral edema. Pulmonary: Symmetric Chest rise. Normal respiratory effort. Abdomen: Soft to touch, non-tender. Ext: No cyanosis, edema, or deformity Skin: No rash. Normal palpation of skin.  Musculoskeletal: Normal digits and nails by inspection. No clubbing.  Neurologic Examination  Mental status/Cognition: Alert, oriented to self, place, month and year, good attention. Speech/language: Non-fluent, comprehension intact, object naming intact but struggles a bit, repetition intact. Cranial nerves:   CN II Pupils equal and reactive to light, no VF deficits   CN III,IV,VI EOM intact, no gaze preference or deviation, no nystagmus   CN V normal sensation in V1, V2, and V3 segments bilaterally   CN VII L facial droop   CN VIII Mildly dysarthric speech   CN IX & X normal palatal elevation, no uvular deviation   CN XI 5/5 head turn and 5/5 shoulder shrug bilaterally   CN XII midline tongue protrusion   Motor:  Muscle bulk: normal, tone normal, pronator drift Yes LUE tremor None Mvmt Root Nerve  Muscle Right Left Comments  SA C5/6 Ax Deltoid 5 4   EF C5/6 Mc Biceps     EE C6/7/8 Rad Triceps     WF C6/7 Med FCR     WE C7/8 PIN ECU     F Ab C8/T1 U ADM/FDI 5 3   HF L1/2/3 Fem Illopsoas     KE L2/3/4 Fem Quad     DF L4/5 D Peron Tib Ant     PF S1/2 Tibial Grc/Sol      Reflexes:  Right Left Comments  Pectoralis      Biceps (C5/6) 2 2   Brachioradialis (C5/6) 2 2    Triceps (C6/7) 2 2     Patellar (L3/4) 2 2    Achilles (S1)  Hoffman      Plantar     Jaw jerk    Sensation:  Light touch Intact throughout   Pin prick    Temperature    Vibration   Proprioception    Coordination/Complex Motor:  - Finger to Nose with ataxia in LUE - Heel to shin with no  - Rapid alternating movement are slowed in LUE - Gait: Deferred.  Labs   CBC: No results for input(s): WBC, NEUTROABS, HGB, HCT, MCV, PLT in the last 168 hours.  Basic Metabolic Panel:  Lab Results  Component Value Date   NA 139 12/04/2019   K 3.9 12/04/2019   CO2 23 12/04/2019   GLUCOSE 192 (H) 12/04/2019   BUN 20 12/04/2019   CREATININE 1.23 12/04/2019   CALCIUM 9.6 12/04/2019   GFRNONAA 55 (L) 12/04/2019   GFRAA >60 12/04/2019   Lipid Panel:  Lab Results  Component Value Date   LDLCALC 121 (H) 07/16/2019   HgbA1c:  Lab Results  Component Value Date   HGBA1C 7.3 (A) 01/28/2020   Urine Drug Screen: No results found for: LABOPIA, COCAINSCRNUR, LABBENZ, AMPHETMU, THCU, LABBARB  Alcohol Level No results found for: [place]  CT Head without contrast: CTH was negative for a large hypodensity concerning for a large territory infarct or hyperdensity concerning for an ICH.  CT angio Head and Neck with contrast: CTA HEAD AND NECK:  1. Decreased number of right MCA branch vessels without a discrete proximal occlusion identified. It is possible that this reflects a stump occlusion of a proximal to mid M2 branch which is not readily apparent by CTA. 2. Intracranial atherosclerosis with multiple anterior and posterior circulation stenoses as detailed above. 3. 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 4. Severe stenosis or short segment occlusion of the left vertebral artery at its origin with patency of the vessel more distally. Heavily diseased left V4 segment. 5. Cervical carotid atherosclerosis without significant stenosis. 6.  Aortic Atherosclerosis (ICD10-I70.0).  CT Perfusion: 95m  mismatch in the R MCA territory  MRI Brain: pending  Impression   FTobyn Vlachis a 80y.o. male with PMH significant for HTN, HLD, Afibb on Xarelto, who presents with left hand weakness, L facial droop, dysarthria, speaking in 1 word or short phrases only and left facial droop with a LKW of 1200 on 04/14/2020. NIHSS of 5. CTH negative for an ICH. Not a candidate for tPA as he took Xarelto within the last 24 hours. CTA with no clear occlusion or stenosis but high concern for an LVO given the decreased vessel density in the R MCA territory. CTP with a 546mmismatch.   Recommendations  [facility]  Plan: - Admit to ICU - Frequent Neuro checks per stroke unit protocol - Recommend brain imaging with MRI Brain without contrast - Recommend Vascular imaging with MRA Angio Head without contrast and [place]otid doppler - Recommend obtaining TTE - Recommend obtaining Lipid panel with LDL - Please start statin if LDL > 70 - Recommend HbA1c - Antithrombotic - Aspirin '81mg'$  daily. - Recommend DVT ppx - SBP goal - Per Neuro IR after Angio vs thrombectomy. - Recommend Telemetry monitoring for arrythmia - Recommend bedside swallow screen prior to PO intake. - Stroke education booklet - Recommend PT/OT/SLP consult - Recommend Urine Tox screen.   ______________________________________________________________________  This patient is critically ill and at significant risk of neurological worsening, death and care requires constant monitoring of vital signs, hemodynamics,respiratory  and cardiac monitoring, neurological assessment, discussion with family, other specialists and medical decision making of high complexity. I spent 50 minutes of neurocritical care time  in the care of  this patient. This was time spent independent of any time provided by nurse practitioner or PA.  Donnetta Simpers [facility] Pager Number  [phone] 04/26/2020  1:22 PM   Thank you for the opportunity to take part in the care of this patient. If you have any further questions, please contact the neurology consultation attending.  Signed,  [place] Pager Number [phone] _ _ _   _ __   _ __ _ _  __ __   _ __   __ _

## 2020-04-25 NOTE — Progress Notes (Addendum)
   04/22/2020 1400  Clinical Encounter Type  Visited With Patient  Visit Type Initial;Code;Spiritual support  Referral From Nurse  Consult/Referral To Chaplain   Chaplain responded to a Code Stroke from page. When the chaplain arrived, the PT was already moved to Cath Lab. Chaplain notified ED receptionist that if any family arrived, contact the chaplain. Chaplain will follow up on PT.

## 2020-04-25 NOTE — Sedation Documentation (Signed)
Patient transported to [place] with RRT and CRNA. Bedside report given to receiving RN. Groin site assessed, clean, dry and intact. No hematoma noted. Soft to palpation. No drainage noted from dressing. Intact distal pulses via doppler

## 2020-04-25 NOTE — Progress Notes (Signed)
RT assisted with transportation of this pt from IR to 4N18 while on full ventilatory support. Pt tolerated well.

## 2020-04-25 NOTE — ED Triage Notes (Signed)
Slurred speech, left sided weakness. Unknown LKN

## 2020-04-25 NOTE — Progress Notes (Signed)
RT obtained ABG on pt with the following results. MD ordered increase in PEEP from 5 to 8 at this time. RT will continue to monitor.    Results for SCOT, ENSOR (MRN [id number]) as of 04/24/2020 21:10  Ref. Range 04/22/2020 20:43  Sample type Unknown ARTERIAL  pH, Arterial Latest Ref Range: 7.350 - 7.450  7.291 (L)  pCO2 arterial Latest Ref Range: 32.0 - 48.0 mmHg 36.3  pO2, Arterial Latest Ref Range: 83.0 - 108.0 mmHg 103  TCO2 Latest Ref Range: 22 - 32 mmol/L 19 (L)  Acid-base deficit Latest Ref Range: 0.0 - 2.0 mmol/L 8.0 (H)  Bicarbonate Latest Ref Range: 20.0 - 28.0 mmol/L 17.9 (L)  O2 Saturation Latest Units: % 98.0  Patient temperature Unknown 35.3 C  Collection site Unknown art line

## 2020-04-25 NOTE — H&P (Addendum)
Stroke Neurology Admission H&P    Chief Complaint: code IR coming from [facility]  HPI: [name] has a PMHx of DM II, HTN, AF s/p cardioversion in 11/2019 on Xarelto, and HLD. Per chart, he reported that yesterday he had a little weakness and slurred speech, had tele visit with his cardiologist. By that time, pt symptoms all resolved completely. He was asked to return in person to office next week. He was in his normal state of health at 0400 04/07/2020 and around noon, he began to have significant left arm weakness, slurred speech and left facial droop. He was seen at [facility] and our neurologist, after obtaining imaging, felt patient could benefit from an angiogram and procedure if stenosis/occlusion was found. He did have ischemic penumbra in [facility] territory. Last dose Xarelto last night  Patient was seen for brief exam at the ED bridge then taken urgently to IR suite.   In review of chart, his last A1c was 7.3 in 01/2020, LDL in 2018, TSH 3.66 in 2019.   LKW: noon today tPA Given: no, patient on Xarelto mRS = 2  Past Medical History:  Diagnosis Date  . Hyperlipidemia 02/16/2017  . Hypertension   DM II, Afib with cardioversion  Past Surgical History:  Procedure Laterality Date  . CARDIAC CATHETERIZATION  2013  . CARDIOVERSION N/A 11/29/2019   Procedure: CARDIOVERSION;  Surgeon: Minna Merritts, MD;  Location: [facility] ORS;  Service: Cardiovascular;  Laterality: N/A;  . CATARACT EXTRACTION Bilateral 04/21/2008   R eye 05-12-08    No family history on file. Social History:  reports that he has never smoked. He has never used smokeless tobacco. He reports that he does not drink alcohol and does not use drugs.  Allergies: No Known Allergies  ROS: 14 point ROS unable to be performed due to emergent event.   Physical Examination: General: Appears well, NAD. Obese.  Psych: Affect appropriate. Calm. Cooperative.  HEENT-  Normocephalic, AT. No lymphadenopathy. No thyromegaly. No  stiffness of neck.  Cardiovascular - RRR. No LE edema.  Lungs -  Normal respiratory effort Abdomen - soft, NT Extremities - warm, well perfused Skin: WDI  NEURO:   NIHSS:  1a Level of Conscious: 0 1b LOC Questions: 0 1c LOC Commands: 0 2 Best Gaze: 0 3 Visual: 0 4 Facial Palsy: 1 5a Motor Arm - left: 1 5b Motor Arm - Right: 0 6a Motor Leg - Left: 1 6b Motor Leg - Right: 1 7 Limb Ataxia: 0 8 Sensory: 0 9 Best Language: 0 10 Dysarthria: 2 11 Extinct and Inattention: 0 TOTAL: 6  Mental Status: AA&O to self, day, month, place. Not to date or year.  Speech/Language: speech is with significant dysarthria.  No aphasia. Responds quickly to commands and questions. Naming, repetition, fluency, and comprehension intact.  Cranial Nerves:  II: PERRL. Visual fields full.  III, IV, VI: EOMI. Eyelids elevate symmetrically.  V: Sensation is intact to light touch and symmetrical to face.  VII: Smile with left facial droop in lower portion.   VIII: hearing intact to voice. IX, X: Palate elevates symmetrically. Phonation is normal.  XI: Shoulder shrug 5/5. XII: tongue is midline without fasciculations. Motor: see MD assessment below.  Tone: is normal and bulk is normal Sensation- Intact to light touch bilaterally. Extinction absent to light touch to DSS.  Coordination: + drift to LUE.   Gait- deferred  Labs pertinent to this encounter: INR 2.2, PTT 55, glucose 76, BNP 1653, procaclitonin 2.2  Imaging: Independently  reviewed by MD.     CTA head and neck 1. Decreased number of right MCA branch vessels without a discrete proximal occlusion identified. It is possible that this reflects a stump occlusion of a proximal to mid M2 branch which is not readily apparent by CTA. 2. Intracranial atherosclerosis with multiple anterior and posterior circulation stenoses as detailed above. 3. 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 4. Severe stenosis or short segment occlusion of the  left vertebral artery at its origin with patency of the vessel more distally. Heavily diseased left V4 segment. 5. Cervical carotid atherosclerosis without significant stenosis. 6.  Aortic Atherosclerosis (ICD10-I70.0).  CTP ASPECTS: 10 CBF (<30%) Volume: 0 mL Perfusion (Tmax>6.0s) volume: 55 mL Mismatch Volume: 55 mL Infarction Location: No core infarct evident by CTP. Ischemic penumbra in the right MCA territory, greatest in the frontal lobe at and superior to the operculum though with parietal and temporal lobe involvement as well.   Assessment: 80 y.o. male  Multiple risk factors for stroke, including, AF, HTN, HLD, and DM. It would appear from his medications and BNP today, he also has CHF. Patient presented to [facility] today with slurred speech and left sided weakness. Imaging was accomplished at [facility] and patient was felt to be a candidate to go to IR. Code IR was activated and patient arrived at [facility]. CTA head positive for decreased ischemic penumbra of right MCA branch vessels without a discret proximal occlusion and possible stump occlusion of a proximal to med M2 branch which is not clear on CTA. Also, found was intracranial atherosclerosis and distal occlusion of the right vertebral artery. Severe stenosis or short segment occlusion of left vertebral artery. After these findings at [facility], patient was sent here for IR.   Impression: symptoms of left sided weakness and slurred speech with noted occlusion and stenoses on CTA.   Plan:  -neurology to admit to stroke team - HgbA1c, fasting lipid panel - MRI, MRA  of the brain without contrast - Frequent neuro checks and NIHSS.  - TTE - will hold off Xarelto for now until MRI done - Risk factor modification-LDL less than 70, tight sugar control, low fat, low cholesterol diet. - perhaps dietary consult.  - Telemetry monitoring - PT consult, OT consult, Speech consult - Stroke education - Stroke team to follow     Patient seen by Clance Boll, NP/Neurology and MD-Xu.  Note and plan to be edited by MD as necessary.   ATTENDING NOTE: I reviewed above note and agree with the assessment and plan. Pt was seen and examined.   80 yo male with PMHx of DM II, HTN, AF s/p cardioversion in 11/2019 on Xarelto, and HLD transfer from [facility] for significant left arm weakness, slurred speech and left facial droop. Time onset 1200pm. NIHSS = 4. CT no acute finding. CTA head and neck left ICA bulb and b/l ICA siphon severe athero with stenosis. Questionable right M2 branch occlusion. CTP right MCA penumbra. Last dose Xarelto last night. Pt not tPA candidate given on Xarelto. He was transferred to [facility] for possible EVT.   On exam, he is lethargic but awake alert, orientated to place and age and time. However, severe dysarthria. Able to name and repeat but in dysarthric voice. No aphasia but paucity of speech, following all simple commands. No gaze palsy or visual field deficit, left facial droop, BUE 3+ proximal with LUE mild pronator drift, distal finger grip left 3/5 and right 4/5. BLE 3/5 proximal, 4+/5 DF/PF  and knee flexion. Sensation symmetrical, FTN b/l intact.   Pt sent to IR suite for potential thrombectomy with Dr. Norma Fredrickson. He will be admitted to ICU post procedure. Will do MRI and MRA post op. Will hold off anticoagulation for now and will consider after MRI and MRA done. PT/OT/speech. Other recs see above.   Robert Hawking, MD PhD Stroke Neurology 04/26/2020 4:35 PM  This patient is critically ill due to right MCA stroke, needing thrombectomy, afib on AC and at significant risk of neurological worsening, death form recurrent stroke, hemorrhagic conversion, heart failure, seizure. This patient's care requires constant monitoring of vital signs, hemodynamics, respiratory and cardiac monitoring, review of multiple databases, neurological assessment, discussion with family, other specialists and medical decision  making of high complexity. I spent 40 minutes of neurocritical care time in the care of this patient. I discussed with Dr. Lorrin Goodell.

## 2020-04-25 NOTE — ED Notes (Signed)
Code stroke called to [place]

## 2020-04-25 NOTE — Anesthesia Procedure Notes (Signed)
Arterial Line Insertion Start/End02/26/2022 1:35 PM, 04/28/2020 1:40 PM Performed by: Moshe Salisbury, CRNA, CRNA  Patient location: OOR procedure area. Preanesthetic checklist: patient identified, IV checked, site marked, risks and benefits discussed, surgical consent, monitors and equipment checked, pre-op evaluation, timeout performed and anesthesia consent Patient sedated Left, radial was placed Catheter size: 20 G Hand hygiene performed  and Seldinger technique used  Attempts: 1 Procedure performed without using ultrasound guided technique. Following insertion, dressing applied and Biopatch. Post procedure assessment: normal  Patient tolerated the procedure well with no immediate complications.

## 2020-04-25 NOTE — Procedures (Signed)
INTERVENTIONAL NEURORADIOLOGY BRIEF POSTPROCEDURE NOTE  Diagnostic cerebral angiogram Mechanical thrombectomy Flat panel head CT Intracranial stenting    Attending: Dr. Pedro Earls  Assistant: None  Diagnosis: Right M2/MCA occlusion  Access site: 47F RCFA  Access closure: 47F angioseal  Anesthesia: General anesthesia  Medication used: refer to anesthesia documentation.  Complications: None.  Estimated blood loss: 50 mL.  Specimen: None.  Findings: There was a proximal right M2/MCA occlusion. Mechanical thrombectomy performed with recanalization and subsequent reocclusion x3. Flat panel CT showed no hemorrhage. Patient was then loaded on cangrelor and an intracranial stent was deployed across a severe stenotic plaque at the M2 origin and a moderately stenotic plaque at the mid M2 segment with complete recanalization of flow (TICI3).  The patient was unstable throughout the procedure with low blood pressure and a brief asystole just prior to stent deployment with spontaneous ROSC. He remained intubated after procedure and was transferred to ICU for continued care.

## 2020-04-25 NOTE — ED Notes (Signed)
Activated code stroke 1333

## 2020-04-25 NOTE — Consult Note (Signed)
NAME:  Robert Daniel, MRN:  [id number], DOB:  1940/03/15, LOS: 0 ADMISSION DATE:  04/23/2020, CONSULTATION DATE:  04/21/2020 REFERRING MD:  Erlinda Hong - neuro, CHIEF COMPLAINT:  R MCA CVA, respiratory failure  Brief History:  80 yo R MCA CVA, s/p stent in NIR, remains intubated  History of Present Illness:  80 yo M PMH Afib on xarelto, HTN, DM2, rpesented to [facility] 2/19 with weakness and slurred speech. Sx began 2/18, but resolved, until 2/19 approx noon when he had L arm weakness, slurred speech, L facial droop. Last took xarelto 2/18 evening. Found to have R MCA CVA Transferred to [facility], and taken urgently to NIR for mechanical thrombectomy of proximal R M2/MCA occlusion, with subsequent reocclusion x 3, followed by intracranial stent deployment with TICI3 recanalization. NIR course also c/b pt instability-- with hypotension,  pause vs asystole with spontaneous ROSC following a prop bolus. He received 2 PRBC in NIR for hypotension; as well as albumin and neo. Remains intubated, transferring to ICU. Received repeat paralytic prior to ICU transfer   Initial labs Na 137 K 4.8 Cr 3 Hgb 8.1 BNP 1653, PCT 2.2, WBC 16.8, INR 2.2  On iSTAT 4 hours later, K 7.2, Hgb 6.8   PCCM consulted in this setting  Past Medical History:  HTN Afib HLD DM2  Significant Hospital Events:  2/19 [facility] ED as code stroke. R MCA CVA. Transferred to [facility] for NIR. Brief asystolic arrest with spontaneous ROSC vs pause in NIR. Remains intubated  Consults:  NIR PCCM  Procedures:   2/19 ETT> 2/19 Art line>   Significant Diagnostic Tests:  2/19 CTA H> R MCA proximal occlusion   Micro Data:  COVID-19 neg Antimicrobials:    Interim History / Subjective:   Arrives to the ICU intubated, chemically paralyzed, sedated on prop and on neo   Objective   There were no vitals taken for this visit.        Intake/Output Summary (Last 24 hours) at 04/15/2020 1911 Last data filed at 04/10/2020 1836 Gross per 24 hour  Intake 1000  ml  Output 575 ml  Net 425 ml   There were no vitals filed for this visit.  Examination: General: Critically ill appearing elderly M, intubated sedated chemically paralyzed HENT: NCAT. ETT secure. Dried blood nasopharynx. R earlobe active bleeding from glucose stick.  Lungs: CTAb, symmetrical chest expansion, mechanically ventilated  Cardiovascular: irregular rhythm reg rate s1s2 no rgm cap refill brisk  Abdomen: Obese soft round non-distended Extremities: BLE 1+ pitting edema. No obvious joint deformity. R fem site without hematoma.  Neuro: Chemically paralyzed  GU: anatomy WNL, foley   Resolved Hospital Problem list     Assessment & Plan:   Acute respiratory failure with hypoxia in setting of CVA, requiring mechanical ventilation  -atelectasis noted on CXR  P -CXR, ABG -MV support -VAP, PAD (prop, PRN fent)  R M2/MCA occlusion, s/p mechanical thrombectomy-- subsequent reocclusion x 3, stent placement with TICI3 P -BR x 6 hrs following sheath removal  -Goal SBP 140  Hypotension -? Drug related vs other etiology P -1L LR -neo   AKI on CKD -Cr in 2021 1.23, arrives with Cr 3 P -trend renal indices, UOP -repeat BMP pending  Hyperkalemia -4.8 to 7.2 in a few hours, the 7.2 is from an iStat device which also reports a pretty significant down trend in H/H , not sure this is accurate  Hypocalcemia P -ECG -repeat BMP STAT to assess K -giving 1g Calcium and 1 amp  bicarb   hx HTN Afib -last xarelto dose 2/18 evening P -ICU monitoring -holding xarelto  -electrolyte optimization   Anemia -initial hgb 8.1, follow up 6.8 on iStat 4 hrs later after 2 PRBC in NIR with anesthesia  -takes xarelto at home, started on cangrelor in NIR P -repeat CBC -if f/u not improved, needs eval for ABLA  Coagulopathy in setting of Xarelto use  DM2  P -SSI  Leukocytosis Elevated PCT P -repeat CBC pending, trend WBC and fever curve  Best practice (evaluated daily)  Diet:  NPO  Pain/Anxiety/Delirium protocol (if indicated): Prop fent  VAP protocol (if indicated): yes  DVT prophylaxis:scd GI prophylaxis: ppi  Glucose control: SSI Mobility: BR Disposition:ICU  Goals of Care:  Last date of multidisciplinary goals of care discussion: per primary  Family and staff present:  Summary of discussion:  Follow up goals of care discussion due:-- Code Status: Full   Labs   CBC: Recent Labs  Lab 04/13/2020 1417  WBC 16.8*  NEUTROABS 15.3*  HGB 8.1*  HCT 25.0*  MCV 88.0  PLT 123XX123    Basic Metabolic Panel: Recent Labs  Lab 04/30/2020 1417  NA 137  K 4.8  CL 104  CO2 18*  GLUCOSE 76  BUN 84*  CREATININE 3.00*  CALCIUM 8.5*   GFR: Estimated Creatinine Clearance: 26.6 mL/min (A) (by C-G formula based on SCr of 3 mg/dL (H)). Recent Labs  Lab 04/16/2020 1411 04/26/2020 1417  PROCALCITON 2.21  --   WBC  --  16.8*    Liver Function Tests: Recent Labs  Lab 04/24/2020 1417  AST 37  ALT 28  ALKPHOS 66  BILITOT 0.7  PROT 6.1*  ALBUMIN 2.4*   No results for input(s): LIPASE, AMYLASE in the last 168 hours. No results for input(s): AMMONIA in the last 168 hours.  ABG No results found for: PHART, PCO2ART, PO2ART, HCO3, TCO2, ACIDBASEDEF, O2SAT   Coagulation Profile: Recent Labs  Lab 04/22/2020 1417  INR 2.2*    Cardiac Enzymes: No results for input(s): CKTOTAL, CKMB, CKMBINDEX, TROPONINI in the last 168 hours.  HbA1C: Hemoglobin A1C  Date/Time Value Ref Range Status  01/28/2020 10:58 AM 7.3 (A) 4.0 - 5.6 % Final  08/23/2016 12:00 AM 7.6  Final  04/05/2016 12:00 AM 7.0  Final   Hgb A1c MFr Bld  Date/Time Value Ref Range Status  07/16/2019 07:51 AM 8.3 (H) <5.7 % of total Hgb Final    Comment:    For someone without known diabetes, a hemoglobin A1c value of 6.5% or greater indicates that they may have  diabetes and this should be confirmed with a follow-up  test. . For someone with known diabetes, a value <7% indicates  that their  diabetes is well controlled and a value  greater than or equal to 7% indicates suboptimal  control. A1c targets should be individualized based on  duration of diabetes, age, comorbid conditions, and  other considerations. . Currently, no consensus exists regarding use of hemoglobin A1c for diagnosis of diabetes for children. Marland Kitchen   08/28/2018 08:44 AM 9.6 (H) <5.7 % of total Hgb Final    Comment:    For someone without known diabetes, a hemoglobin A1c value of 6.5% or greater indicates that they may have  diabetes and this should be confirmed with a follow-up  test. . For someone with known diabetes, a value <7% indicates  that their diabetes is well controlled and a value  greater than or equal to 7% indicates suboptimal  control. A1c targets should be individualized based on  duration of diabetes, age, comorbid conditions, and  other considerations. . Currently, no consensus exists regarding use of hemoglobin A1c for diagnosis of diabetes for children. .     CBG: No results for input(s): GLUCAP in the last 168 hours.  Review of Systems:   Unable to obtain, intubated and sedated  Past Medical History:  He,  has a past medical history of Hyperlipidemia (02/16/2017) and Hypertension.   Surgical History:   Past Surgical History:  Procedure Laterality Date  . CARDIAC CATHETERIZATION  2013  . CARDIOVERSION N/A 11/29/2019   Procedure: CARDIOVERSION;  Surgeon: Minna Merritts, MD;  Location: [facility] ORS;  Service: Cardiovascular;  Laterality: N/A;  . CATARACT EXTRACTION Bilateral 04/21/2008   R eye 05-12-08     Social History:   reports that he has never smoked. He has never used smokeless tobacco. He reports that he does not drink alcohol and does not use drugs.   Family History:  His family history is not on file.   Allergies No Known Allergies   Home Medications  Prior to Admission medications   Medication Sig Start Date End Date Taking? Authorizing Provider   acetaminophen (TYLENOL) 500 MG tablet Take 500 mg by mouth every 6 (six) hours as needed.    [provider]  Ascorbic Acid (VITAMIN C) 1000 MG tablet Take 1,000 mg by mouth daily.    [provider]  aspirin EC 81 MG tablet Take 81 mg by mouth daily.    [provider]  atorvastatin (LIPITOR) 80 MG tablet Take 1 tablet (80 mg total) by mouth daily. 12/19/19 03/18/20  Marrianne Mood D, PA-C  benazepril (LOTENSIN) 40 MG tablet Take 1 tablet by mouth once daily 11/25/19   Loel Dubonnet, NP  Cinnamon 500 MG TABS Take 1,000 mg by mouth daily.    [provider]  cloNIDine (CATAPRES) 0.2 MG tablet Take 1 tablet by mouth 4 times daily 01/28/20   Parks Ranger, Devonne Doughty, DO  co-enzyme Q-10 30 MG capsule Take 30 mg by mouth 3 (three) times daily.    [provider]  felodipine (PLENDIL) 10 MG 24 hr tablet Take 1 tablet (10 mg total) by mouth daily. 03/12/19   Minna Merritts, MD  gemfibrozil (LOPID) 600 MG tablet TAKE 1 TABLET BY MOUTH TWICE DAILY BEFORE A MEAL 07/30/19   Karamalegos, Devonne Doughty, DO  glimepiride (AMARYL) 4 MG tablet Take 1 tablet (4 mg total) by mouth daily with breakfast. 03/26/20   Karamalegos, Devonne Doughty, DO  hydrALAZINE (APRESOLINE) 100 MG tablet TAKE 1 TABLET BY MOUTH IN THE MORNING, AT NOON, IN THE EVENING, AND AT BEDTIME 02/25/20   Loel Dubonnet, NP  ibuprofen (ADVIL,MOTRIN) 200 MG tablet Take 800 mg by mouth every 6 (six) hours as needed.    [provider]  isosorbide mononitrate (IMDUR) 60 MG 24 hr tablet Take 1 tablet by mouth twice daily 02/24/20   Loel Dubonnet, NP  labetalol (NORMODYNE) 100 MG tablet Take 1 tablet (100 mg total) by mouth 2 (two) times daily. Take 1 tablet (100 mg total) by mouth 2 (two) times daily. May take an additional half tablet as needed for systolic blood pressure [id number]. 05/09/19   Loel Dubonnet, NP  metFORMIN (GLUCOPHAGE) 1000 MG tablet TAKE 1 TABLET BY MOUTH TWICE DAILY WITH A MEAL  01/28/20   Karamalegos, Devonne Doughty, DO  Multiple Vitamin (MULTIVITAMIN) tablet Take 1 tablet by mouth daily.  [provider]  nitroGLYCERIN (NITROSTAT) 0.4 MG SL tablet DISSOLVE ONE TABLET UNDER THE TONGUE EVERY 5 MINUTES AS NEEDED FOR CHEST PAIN.  DO NOT EXCEED A TOTAL OF 3 DOSES IN 15 MINUTES 12/31/19   Gollan, Kathlene November, MD  Omega-3 Fatty Acids (OMEGA-3 FISH OIL) 1200 MG CAPS Take 2,000 mg by mouth 2 (two) times daily with a meal.    [provider]  rivaroxaban (XARELTO) 20 MG TABS tablet Take 1 tablet (20 mg total) by mouth daily with supper. 03/26/20   Minna Merritts, MD  torsemide (DEMADEX) 20 MG tablet Take 1 tablet (20 mg total) by mouth every other day. 10/10/19 01/08/20  Marrianne Mood D, PA-C  Turmeric 500 MG TABS Take by mouth daily.     [provider]     Critical care time 50 min     CRITICAL CARE Performed by: Cristal Generous   Total critical care time: 50 minutes  Critical care time was exclusive of separately billable procedures and treating other patients.  Critical care was necessary to treat or prevent imminent or life-threatening deterioration.  Critical care was time spent personally by me on the following activities: development of treatment plan with patient and/or surrogate as well as nursing, discussions with consultants, evaluation of patient's response to treatment, examination of patient, obtaining history from patient or surrogate, ordering and performing treatments and interventions, ordering and review of laboratory studies, ordering and review of radiographic studies, pulse oximetry and re-evaluation of patient's condition.  Eliseo Gum MSN, AGACNP-BC Bee [id number] If no answer, [phone] 04/22/2020, 8:41 PM

## 2020-04-25 NOTE — Anesthesia Postprocedure Evaluation (Signed)
Anesthesia Post Note  Patient: Robert Daniel  Procedure(s) Performed: RADIOLOGY WITH ANESTHESIA (N/A )     Patient location during evaluation: ICU Anesthesia Type: General Level of consciousness: sedated Pain management: pain level controlled Vital Signs Assessment: post-procedure vital signs reviewed and stable Respiratory status: patient remains intubated per anesthesia plan Cardiovascular status: stable Postop Assessment: no apparent nausea or vomiting Anesthetic complications: no Comments: Patient demonstrated multi organ system failure and remained acidemic and hyperkalemic despite numerous rounds of interventions. Decision made to keep intubated. Primary team updated throughout the anesthetic. CCM consult recommended.    No complications documented.  Last Vitals:  Vitals:   04/30/2020 1905 04/26/2020 1936  BP: (!) 98/53 (!) 98/38  Pulse: 82 98  Resp:  (!) 22  Temp: (!) 35 C   SpO2: 96% 97%    Last Pain:  Vitals:   04/18/2020 1905  TempSrc: Bladder                 Tawna Alwin P Nannie Starzyk

## 2020-04-25 NOTE — Addendum Note (Signed)
Addendum  created 04/16/2020 2020 by Suzy Bouchard, CRNA   Intraprocedure Staff edited

## 2020-04-25 NOTE — Anesthesia Preprocedure Evaluation (Addendum)
Anesthesia Evaluation  Patient identified by MRN, date of birth, ID band  Reviewed: Allergy & Precautions, Patient's Chart, lab work & pertinent test results, Unable to perform ROS - Chart review onlyPreop documentation limited or incomplete due to emergent nature of procedure.  Airway Mallampati: III  TM Distance: >3 FB Neck ROM: Full    Dental  (+) Missing   Pulmonary neg pulmonary ROS,    Pulmonary exam normal breath sounds clear to auscultation       Cardiovascular hypertension, Pt. on medications and Pt. on home beta blockers Normal cardiovascular exam+ dysrhythmias Atrial Fibrillation  Rhythm:Regular Rate:Normal  ECG: a-fib, rate 86   Neuro/Psych Slurred speech, left sided weakness CVA    GI/Hepatic negative GI ROS, Neg liver ROS,   Endo/Other  diabetes, Oral Hypoglycemic Agents  Renal/GU CRFRenal disease     Musculoskeletal negative musculoskeletal ROS (+)   Abdominal (+) + obese,   Peds  Hematology  (+) Blood dyscrasia, anemia , HLD INR: 2.2   Anesthesia Other Findings code stroke  Reproductive/Obstetrics                            Anesthesia Physical Anesthesia Plan  ASA: IV and emergent  Anesthesia Plan: General   Post-op Pain Management:    Induction: Intravenous and Rapid sequence  PONV Risk Score and Plan: 2 and Ondansetron, Dexamethasone and Treatment may vary due to age or medical condition  Airway Management Planned: Oral ETT  Additional Equipment: Arterial line  Intra-op Plan:   Post-operative Plan: Possible Post-op intubation/ventilation  Informed Consent:     Only emergency history available  Plan Discussed with: CRNA  Anesthesia Plan Comments:        Anesthesia Quick Evaluation

## 2020-04-25 NOTE — ED Notes (Signed)
Report given to Rennie Natter at [facility] ED

## 2020-04-25 NOTE — ED Provider Notes (Signed)
[facility] Emergency Department Provider Note  ____________________________________________   Event Date/Time   First MD Initiated Contact with Patient 05/04/2020 1332     (approximate)  I have reviewed the triage vital signs and the nursing notes.   HISTORY  Chief Complaint Cerebrovascular Accident   HPI Robert Daniel is a 80 y.o. male past medical history of HTN, HDL, osteoarthritis and A. fib on Xarelto as well as several episodes of slurred speech on and off for the last couple weeks who presents via EMS from home for assessment of a fall and onset of recurrence of slurred speech around noon today. Patient states he is not sure when fell but may have tripped or slipped falling onto his left shoulder sometime last night.  He denies hitting anywhere else other than the left shoulder any pain in any other location.  He states he has had some shortness of breath over several weeks does not seem any different today.  He has no history of heart failure or COPD.Marland Kitchen He denies hitting his head or any LOC. States he did take his Xarelto earlier today. Denies any other pain including in the right shoulder, back, neck, chest, abdomen or lower extremities. Denies any recent fevers, chills, chest pain, cough, nausea, vomiting, diarrhea, dysuria, rash or other recent falls or injuries. Does endorse some chronic weakness in his left arm from prior injury.      Past Medical History:  Diagnosis Date  . Hyperlipidemia 02/16/2017  . Hypertension     Patient Active Problem List   Diagnosis Date Noted  . Fatigue 01/27/2020  . Osteoarthritis of right knee 05/12/2017  . Patellofemoral arthritis of right knee 05/12/2017  . Shortness of breath 02/17/2017  . Hyperlipidemia associated with type 2 diabetes mellitus ([place]) 02/16/2017  . Resistant hypertension 12/20/2016  . Type 2 diabetes with nephropathy ([place]) 12/20/2016  . White coat syndrome with hypertension 12/20/2016     Past Surgical History:  Procedure Laterality Date  . CARDIAC CATHETERIZATION  2013  . CARDIOVERSION N/A 11/29/2019   Procedure: CARDIOVERSION;  Surgeon: Minna Merritts, MD;  Location: [facility] ORS;  Service: Cardiovascular;  Laterality: N/A;  . CATARACT EXTRACTION Bilateral 04/21/2008   R eye 05-12-08    Prior to Admission medications   Medication Sig Start Date End Date Taking? Authorizing Provider  acetaminophen (TYLENOL) 500 MG tablet Take 500 mg by mouth every 6 (six) hours as needed.    [provider]  Ascorbic Acid (VITAMIN C) 1000 MG tablet Take 1,000 mg by mouth daily.    [provider]  aspirin EC 81 MG tablet Take 81 mg by mouth daily.    [provider]  atorvastatin (LIPITOR) 80 MG tablet Take 1 tablet (80 mg total) by mouth daily. 12/19/19 03/18/20  Marrianne Mood D, PA-C  benazepril (LOTENSIN) 40 MG tablet Take 1 tablet by mouth once daily 11/25/19   Loel Dubonnet, NP  Cinnamon 500 MG TABS Take 1,000 mg by mouth daily.    [provider]  cloNIDine (CATAPRES) 0.2 MG tablet Take 1 tablet by mouth 4 times daily 01/28/20   Parks Ranger, Devonne Doughty, DO  co-enzyme Q-10 30 MG capsule Take 30 mg by mouth 3 (three) times daily.    [provider]  felodipine (PLENDIL) 10 MG 24 hr tablet Take 1 tablet (10 mg total) by mouth daily. 03/12/19   Minna Merritts, MD  gemfibrozil (LOPID) 600 MG tablet TAKE 1 TABLET BY MOUTH TWICE DAILY BEFORE  A MEAL 07/30/19   Karamalegos, Devonne Doughty, DO  glimepiride (AMARYL) 4 MG tablet Take 1 tablet (4 mg total) by mouth daily with breakfast. 03/26/20   Karamalegos, Devonne Doughty, DO  hydrALAZINE (APRESOLINE) 100 MG tablet TAKE 1 TABLET BY MOUTH IN THE MORNING, AT NOON, IN THE EVENING, AND AT BEDTIME 02/25/20   Loel Dubonnet, NP  ibuprofen (ADVIL,MOTRIN) 200 MG tablet Take 800 mg by mouth every 6 (six) hours as needed.    [provider]  isosorbide mononitrate (IMDUR) 60 MG 24 hr tablet Take 1  tablet by mouth twice daily 02/24/20   Loel Dubonnet, NP  labetalol (NORMODYNE) 100 MG tablet Take 1 tablet (100 mg total) by mouth 2 (two) times daily. Take 1 tablet (100 mg total) by mouth 2 (two) times daily. May take an additional half tablet as needed for systolic blood pressure [id number]. 05/09/19   Loel Dubonnet, NP  metFORMIN (GLUCOPHAGE) 1000 MG tablet TAKE 1 TABLET BY MOUTH TWICE DAILY WITH A MEAL 01/28/20   Karamalegos, Devonne Doughty, DO  Multiple Vitamin (MULTIVITAMIN) tablet Take 1 tablet by mouth daily.    [provider]  nitroGLYCERIN (NITROSTAT) 0.4 MG SL tablet DISSOLVE ONE TABLET UNDER THE TONGUE EVERY 5 MINUTES AS NEEDED FOR CHEST PAIN.  DO NOT EXCEED A TOTAL OF 3 DOSES IN 15 MINUTES 12/31/19   Gollan, Kathlene November, MD  Omega-3 Fatty Acids (OMEGA-3 FISH OIL) 1200 MG CAPS Take 2,000 mg by mouth 2 (two) times daily with a meal.    [provider]  rivaroxaban (XARELTO) 20 MG TABS tablet Take 1 tablet (20 mg total) by mouth daily with supper. 03/26/20   Minna Merritts, MD  torsemide (DEMADEX) 20 MG tablet Take 1 tablet (20 mg total) by mouth every other day. 10/10/19 01/08/20  Marrianne Mood D, PA-C  Turmeric 500 MG TABS Take by mouth daily.     [provider]    Allergies Patient has no known allergies.  No family history on file.  Social History Social History   Tobacco Use  . Smoking status: Never Smoker  . Smokeless tobacco: Never Used  Vaping Use  . Vaping Use: Never used  Substance Use Topics  . Alcohol use: No    Comment: wine occasionaly   . Drug use: No    Review of Systems  Review of Systems  Constitutional: Negative for chills and fever.  HENT: Negative for sore throat.   Eyes: Negative for pain.  Respiratory: Negative for cough and stridor.   Cardiovascular: Negative for chest pain.  Gastrointestinal: Negative for vomiting.  Genitourinary: Negative for dysuria.  Musculoskeletal: Positive for falls. Negative for myalgias.   Skin: Negative for rash.  Neurological: Positive for speech change and weakness ( L shoulder ). Negative for seizures, loss of consciousness and headaches.  Psychiatric/Behavioral: Negative for suicidal ideas.  All other systems reviewed and are negative.     ____________________________________________   PHYSICAL EXAM:  VITAL SIGNS: ED Triage Vitals [04/24/2020 1332]  Enc Vitals Group     BP      Pulse      Resp      Temp      Temp src      SpO2      Weight 249 lb 1.9 oz (113 kg)     Height '6\' 3"'$  (1.905 m)     Head Circumference      Peak Flow      Pain Score  Pain Loc      Pain Edu?      Excl. in [place]?    Vitals:   04/22/2020 1447 04/26/2020 1449  BP: 110/63 110/63  Pulse: 93 85  Resp: (!) 32 (!) 33  Temp: (!) 96.1 F (35.6 C) (!) 96.1 F (35.6 C)  SpO2: 95% 95%   Physical Exam Vitals and nursing note reviewed.  Constitutional:      Appearance: He is well-developed and well-nourished.  HENT:     Head: Normocephalic and atraumatic.     Right Ear: External ear normal.     Left Ear: External ear normal.     Nose: Nose normal.     Mouth/Throat:     Mouth: Mucous membranes are dry.  Eyes:     Conjunctiva/sclera: Conjunctivae normal.  Cardiovascular:     Rate and Rhythm: Normal rate and regular rhythm.     Heart sounds: No murmur heard.   Pulmonary:     Effort: Pulmonary effort is normal. Tachypnea present. No respiratory distress.     Breath sounds: Examination of the right-lower field reveals rales. Rales present.  Abdominal:     Palpations: Abdomen is soft.     Tenderness: There is no abdominal tenderness.  Musculoskeletal:     Cervical back: Neck supple.     Right lower leg: Edema present.     Left lower leg: Edema present.  Skin:    General: Skin is warm and dry.  Neurological:     Mental Status: He is alert.     Cranial Nerves: Facial asymmetry present.  Psychiatric:        Mood and Affect: Mood and affect normal.        Speech: Speech is  slurred.     No tenderness step-offs or deformities over the C/T/L-spine. 2+ bilateral radial and DP pulses. Deformities tenderness or effusion of the bilateral shoulders, elbows, wrists, knees or ankles. Patient is slightly weaker in his left shoulder on abduction but otherwise seems to have symmetric strength of his bilateral upper extremities and bilateral lower extremities. Patient is intact to light touch in all extremities. Patient has some difficulty with dissipating testing of pronator drift as he cannot elevate his left arm sizes right she says is baseline there is no obvious drift. Cranial nerves II through XII grossly intact. ____________________________________________   LABS (all labs ordered are listed, but only abnormal results are displayed)  Labs Reviewed  PROTIME-INR - Abnormal; Notable for the following components:      Result Value   Prothrombin Time 23.8 (*)    INR 2.2 (*)    All other components within normal limits  APTT - Abnormal; Notable for the following components:   aPTT 55 (*)    All other components within normal limits  CBC - Abnormal; Notable for the following components:   WBC 16.8 (*)    RBC 2.84 (*)    Hemoglobin 8.1 (*)    HCT 25.0 (*)    All other components within normal limits  DIFFERENTIAL - Abnormal; Notable for the following components:   Neutro Abs 15.3 (*)    Abs Immature Granulocytes 0.18 (*)    All other components within normal limits  COMPREHENSIVE METABOLIC PANEL - Abnormal; Notable for the following components:   CO2 18 (*)    BUN 84 (*)    Creatinine, Ser 3.00 (*)    Calcium 8.5 (*)    Total Protein 6.1 (*)    Albumin 2.4 (*)  GFR, Estimated 20 (*)    All other components within normal limits  RESP PANEL BY RT-PCR (FLU A&B, COVID) ARPGX2  ETHANOL  URINE DRUG SCREEN, QUALITATIVE ([facility] ONLY)  URINALYSIS, ROUTINE W REFLEX MICROSCOPIC  BRAIN NATRIURETIC PEPTIDE  PROCALCITONIN  TYPE AND SCREEN    ____________________________________________  EKG  A. fib with a rate of 86, normal axis, prolonged QTc interval at 520 Q wave in aVL without other clear evidence of acute ischemia or other significant underlying arrhythmia. ____________________________________________  RADIOLOGY  ED MD interpretation: CT without contrast of the head shows no clear intracranial hemorrhage or other clear acute cranial process.  Official radiology report(s): DG Chest 1 View  Result Date: 04/22/2020 CLINICAL DATA:  Acute shortness of breath.  CVA. EXAM: CHEST  1 VIEW COMPARISON:  07/08/2019 chest radiograph FINDINGS: This is a low volume study. The cardiomediastinal silhouette is unchanged. Mild pulmonary vascular congestion noted. There is no evidence of focal airspace disease, pulmonary edema, suspicious pulmonary nodule/mass, pleural effusion, or pneumothorax. No acute bony abnormalities are identified. IMPRESSION: Mild pulmonary vascular congestion. Electronically Signed   By: Margarette Canada M.D.   On: 04/21/2020 15:02   CT CEREBRAL PERFUSION W CONTRAST  Result Date: 04/10/2020 CLINICAL DATA:  Slurred speech and left-sided weakness. EXAM: CT ANGIOGRAPHY HEAD AND NECK CT PERFUSION BRAIN TECHNIQUE: Multidetector CT imaging of the head and neck was performed using the standard protocol during bolus administration of intravenous contrast. Multiplanar CT image reconstructions and MIPs were obtained to evaluate the vascular anatomy. Carotid stenosis measurements (when applicable) are obtained utilizing NASCET criteria, using the distal internal carotid diameter as the denominator. Multiphase CT imaging of the brain was performed following IV bolus contrast injection. Subsequent parametric perfusion maps were calculated using RAPID software. CONTRAST:  73m OMNIPAQUE IOHEXOL 350 MG/ML SOLN COMPARISON:  None. FINDINGS: CT HEAD FINDINGS Brain: There is no evidence of an acute infarct, intracranial hemorrhage, mass,  midline shift, or extra-axial fluid collection. Mild cerebral atrophy is within normal limits for age. Patchy hypodensities in the cerebral white matter bilaterally are nonspecific but compatible with mild chronic small vessel ischemic disease. There is a chronic lacunar infarct at the inferior aspect of the right caudate head. Vascular: Calcified atherosclerosis at the skull base. No hyperdense vessel. Skull: No fracture or suspicious osseous lesion. Sinuses/Orbits: Mild scattered mucosal thickening in the paranasal sinuses. Clear mastoid air cells. Bilateral cataract extraction. Other: None. ASPECTS ([place]roke Program Early CT Score) - Ganglionic level infarction (caudate, lentiform nuclei, internal capsule, insula, M1-M3 cortex): 7 - Supraganglionic infarction (M4-M6 cortex): 3 Total score (0-10 with 10 being normal): 10 Review of the MIP images confirms the above findings CTA NECK FINDINGS Aortic arch: Normal variant aortic arch branching pattern with common origin of the brachiocephalic and left common carotid arteries. Mild calcified plaque in the aortic arch without evidence of a significant arch vessel origin stenosis. Right carotid system: Patent with mild scattered predominantly calcified plaque throughout the common carotid artery and at the carotid bifurcation. No evidence of a significant stenosis or dissection. Left carotid system: Patent with mild plaque in the mid common carotid artery not resulting in significant stenosis. More extensive, heavily calcified plaque at the carotid bifurcation results in less than 50% proximal ICA stenosis. No evidence of dissection. Vertebral arteries: There is either severe stenosis or short segment occlusion of the proximal left vertebral artery at its origin with the vessel being patent throughout the remainder of the neck. There is a diffusely thready appearance of the  right vertebral artery V1 and V2 segments with the right V3 segment being occluded.  Skeleton: Advanced cervical disc and facet degeneration. Other neck: No evidence of cervical lymphadenopathy or mass. Upper chest: Partially visualized small right pleural effusion. No apical lung consolidation. Review of the MIP images confirms the above findings CTA HEAD FINDINGS Anterior circulation: The internal carotid arteries are patent from skull base to carotid termini with calcified plaque resulting in mild cavernous and proximal supraclinoid stenosis bilaterally. The right M1 segment is patent with a mild stenosis proximally. There is a decreased number of distal right MCA branch vessels compared to the left, however a discrete proximal branch occlusion is not identified. The left MCA is patent with branch vessel irregularity as well as a mild-to-moderate proximal M2 stenosis. Both ACAs are patent without evidence of a significant proximal stenosis. No aneurysm is identified. Posterior circulation: The intracranial left vertebral artery is patent but diffusely irregular and narrowed with multiple moderate to severe stenoses. The right V4 segment is occluded proximally with likely retrograde filling distally supplying the right PICA. 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 Patent SCAs are seen bilaterally. There is a fetal origin of the PCAs without evidence of a significant proximal PCA stenosis. No aneurysm is identified. Venous sinuses: Patent. Anatomic variants: Fetal origin of the PCAs. Review of the MIP images confirms the above findings CT Brain Perfusion Findings: ASPECTS: 10 CBF (<30%) Volume: 0 mL Perfusion (Tmax>6.0s) volume: 55 mL Mismatch Volume: 55 mL Infarction Location: No core infarct evident by CTP. Ischemic penumbra in the right MCA territory, greatest in the frontal lobe at and superior to the operculum though with parietal and temporal lobe involvement as well.  IMPRESSION: CT HEAD: 1. No evidence of acute intracranial abnormality.  ASPECTS of 10. 2. Mild chronic small vessel ischemic disease. CTA HEAD AND NECK: 1. Decreased number of right MCA branch vessels without a discrete proximal occlusion identified. It is possible that this reflects a stump occlusion of a proximal to mid M2 branch which is not readily apparent by CTA. 2. Intracranial atherosclerosis with multiple anterior and posterior circulation stenoses as detailed above. 3. 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 4. Severe stenosis or short segment occlusion of the left vertebral artery at its origin with patency of the vessel more distally. Heavily diseased left V4 segment. 5. Cervical carotid atherosclerosis without significant stenosis. 6.  Aortic Atherosclerosis (ICD10-I70.0). CT BRAIN PERFUSION: 55 mL of ischemic penumbra in the right MCA territory without evidence of a core infarct. These results were called by telephone at the time of interpretation on 04/11/2020 at 2:15 pm to Dr. Donnetta Simpers, who verbally acknowledged these results. Electronically Signed   By: Logan Bores M.D.   On: 05/01/2020 14:58   CT ANGIO HEAD CODE STROKE  Result Date: 04/28/2020 CLINICAL DATA:  Slurred speech and left-sided weakness. EXAM: CT ANGIOGRAPHY HEAD AND NECK CT PERFUSION BRAIN TECHNIQUE: Multidetector CT imaging of the head and neck was performed using the standard protocol during bolus administration of intravenous contrast. Multiplanar CT image reconstructions and MIPs were obtained to evaluate the vascular anatomy. Carotid stenosis measurements (when applicable) are obtained utilizing NASCET criteria, using the distal internal carotid diameter as the denominator. Multiphase CT imaging of the brain was performed following IV bolus contrast injection. Subsequent parametric perfusion maps were calculated using RAPID software. CONTRAST:  18m OMNIPAQUE IOHEXOL 350 MG/ML SOLN COMPARISON:  None.  FINDINGS:  CT HEAD FINDINGS Brain: There is no evidence of an acute infarct, intracranial hemorrhage, mass, midline shift, or extra-axial fluid collection. Mild cerebral atrophy is within normal limits for age. Patchy hypodensities in the cerebral white matter bilaterally are nonspecific but compatible with mild chronic small vessel ischemic disease. There is a chronic lacunar infarct at the inferior aspect of the right caudate head. Vascular: Calcified atherosclerosis at the skull base. No hyperdense vessel. Skull: No fracture or suspicious osseous lesion. Sinuses/Orbits: Mild scattered mucosal thickening in the paranasal sinuses. Clear mastoid air cells. Bilateral cataract extraction. Other: None. ASPECTS ([place] Stroke Program Early CT Score) - Ganglionic level infarction (caudate, lentiform nuclei, internal capsule, insula, M1-M3 cortex): 7 - Supraganglionic infarction (M4-M6 cortex): 3 Total score (0-10 with 10 being normal): 10 Review of the MIP images confirms the above findings CTA NECK FINDINGS Aortic arch: Normal variant aortic arch branching pattern with common origin of the brachiocephalic and left common carotid arteries. Mild calcified plaque in the aortic arch without evidence of a significant arch vessel origin stenosis. Right carotid system: Patent with mild scattered predominantly calcified plaque throughout the common carotid artery and at the carotid bifurcation. No evidence of a significant stenosis or dissection. Left carotid system: Patent with mild plaque in the mid common carotid artery not resulting in significant stenosis. More extensive, heavily calcified plaque at the carotid bifurcation results in less than 50% proximal ICA stenosis. No evidence of dissection. Vertebral arteries: There is either severe stenosis or short segment occlusion of the proximal left vertebral artery at its origin with the vessel being patent throughout the remainder of the neck. There is a diffusely thready appearance of  the right vertebral artery V1 and V2 segments with the right V3 segment being occluded. Skeleton: Advanced cervical disc and facet degeneration. Other neck: No evidence of cervical lymphadenopathy or mass. Upper chest: Partially visualized small right pleural effusion. No apical lung consolidation. Review of the MIP images confirms the above findings CTA HEAD FINDINGS Anterior circulation: The internal carotid arteries are patent from skull base to carotid termini with calcified plaque resulting in mild cavernous and proximal supraclinoid stenosis bilaterally. The right M1 segment is patent with a mild stenosis proximally. There is a decreased number of distal right MCA branch vessels compared to the left, however a discrete proximal branch occlusion is not identified. The left MCA is patent with branch vessel irregularity as well as a mild-to-moderate proximal M2 stenosis. Both ACAs are patent without evidence of a significant proximal stenosis. No aneurysm is identified. Posterior circulation: The intracranial left vertebral artery is patent but diffusely irregular and narrowed with multiple moderate to severe stenoses. The right V4 segment is occluded proximally with likely retrograde filling distally supplying the right PICA. 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 Patent SCAs are seen bilaterally. There is a fetal origin of the PCAs without evidence of a significant proximal PCA stenosis. No aneurysm is identified. Venous sinuses: Patent. Anatomic variants: Fetal origin of the PCAs. Review of the MIP images confirms the above findings CT Brain Perfusion Findings: ASPECTS: 10 CBF (<30%) Volume: 0 mL Perfusion (Tmax>6.0s) volume: 55 mL Mismatch Volume: 55 mL Infarction Location: No core infarct evident by CTP. Ischemic penumbra in the right MCA territory, greatest in the frontal lobe at and  superior to the operculum though with parietal and temporal lobe involvement as well. IMPRESSION: CT HEAD: 1. No evidence  of acute intracranial abnormality.  ASPECTS of 10. 2. Mild chronic small vessel ischemic disease. CTA HEAD AND NECK: 1. Decreased number of right MCA branch vessels without a discrete proximal occlusion identified. It is possible that this reflects a stump occlusion of a proximal to mid M2 branch which is not readily apparent by CTA. 2. Intracranial atherosclerosis with multiple anterior and posterior circulation stenoses as detailed above. 3. 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 4. Severe stenosis or short segment occlusion of the left vertebral artery at its origin with patency of the vessel more distally. Heavily diseased left V4 segment. 5. Cervical carotid atherosclerosis without significant stenosis. 6.  Aortic Atherosclerosis (ICD10-I70.0). CT BRAIN PERFUSION: 55 mL of ischemic penumbra in the right MCA territory without evidence of a core infarct. These results were called by telephone at the time of interpretation on 04/21/2020 at 2:15 pm to Dr. Donnetta Simpers, who verbally acknowledged these results. Electronically Signed   By: Logan Bores M.D.   On: 04/28/2020 14:58   CT ANGIO NECK CODE STROKE  Result Date: 04/20/2020 CLINICAL DATA:  Slurred speech and left-sided weakness. EXAM: CT ANGIOGRAPHY HEAD AND NECK CT PERFUSION BRAIN TECHNIQUE: Multidetector CT imaging of the head and neck was performed using the standard protocol during bolus administration of intravenous contrast. Multiplanar CT image reconstructions and MIPs were obtained to evaluate the vascular anatomy. Carotid stenosis measurements (when applicable) are obtained utilizing NASCET criteria, using the distal internal carotid diameter as the denominator. Multiphase CT imaging of the brain was performed following IV bolus contrast injection. Subsequent parametric perfusion maps were calculated using RAPID  software. CONTRAST:  58m OMNIPAQUE IOHEXOL 350 MG/ML SOLN COMPARISON:  None. FINDINGS: CT HEAD FINDINGS Brain: There is no evidence of an acute infarct, intracranial hemorrhage, mass, midline shift, or extra-axial fluid collection. Mild cerebral atrophy is within normal limits for age. Patchy hypodensities in the cerebral white matter bilaterally are nonspecific but compatible with mild chronic small vessel ischemic disease. There is a chronic lacunar infarct at the inferior aspect of the right caudate head. Vascular: Calcified atherosclerosis at the skull base. No hyperdense vessel. Skull: No fracture or suspicious osseous lesion. Sinuses/Orbits: Mild scattered mucosal thickening in the paranasal sinuses. Clear mastoid air cells. Bilateral cataract extraction. Other: None. ASPECTS ([place] Score) - Ganglionic level infarction (caudate, lentiform nuclei, internal capsule, insula, M1-M3 cortex): 7 - Supraganglionic infarction (M4-M6 cortex): 3 Total score (0-10 with 10 being normal): 10 Review of the MIP images confirms the above findings CTA NECK FINDINGS Aortic arch: Normal variant aortic arch branching pattern with common origin of the brachiocephalic and left common carotid arteries. Mild calcified plaque in the aortic arch without evidence of a significant arch vessel origin stenosis. Right carotid system: Patent with mild scattered predominantly calcified plaque throughout the common carotid artery and at the carotid bifurcation. No evidence of a significant stenosis or dissection. Left carotid system: Patent with mild plaque in the mid common carotid artery not resulting in significant stenosis. More extensive, heavily calcified plaque at the carotid bifurcation results in less than 50% proximal ICA stenosis. No evidence of dissection. Vertebral arteries: There is either severe stenosis or short segment occlusion of the proximal left vertebral artery at its origin with the vessel being  patent throughout the remainder of the neck. There is a diffusely thready appearance of the right vertebral artery V1 and V2 segments with the right V3 segment being occluded. Skeleton: Advanced cervical disc and facet degeneration. Other neck:  No evidence of cervical lymphadenopathy or mass. Upper chest: Partially visualized small right pleural effusion. No apical lung consolidation. Review of the MIP images confirms the above findings CTA HEAD FINDINGS Anterior circulation: The internal carotid arteries are patent from skull base to carotid termini with calcified plaque resulting in mild cavernous and proximal supraclinoid stenosis bilaterally. The right M1 segment is patent with a mild stenosis proximally. There is a decreased number of distal right MCA branch vessels compared to the left, however a discrete proximal branch occlusion is not identified. The left MCA is patent with branch vessel irregularity as well as a mild-to-moderate proximal M2 stenosis. Both ACAs are patent without evidence of a significant proximal stenosis. No aneurysm is identified. Posterior circulation: The intracranial left vertebral artery is patent but diffusely irregular and narrowed with multiple moderate to severe stenoses. The right V4 segment is occluded proximally with likely retrograde filling distally supplying the right PICA. 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 Patent SCAs are seen bilaterally. There is a fetal origin of the PCAs without evidence of a significant proximal PCA stenosis. No aneurysm is identified. Venous sinuses: Patent. Anatomic variants: Fetal origin of the PCAs. Review of the MIP images confirms the above findings CT Brain Perfusion Findings: ASPECTS: 10 CBF (<30%) Volume: 0 mL Perfusion (Tmax>6.0s) volume: 55 mL Mismatch Volume: 55 mL Infarction Location: No core infarct evident by  CTP. Ischemic penumbra in the right MCA territory, greatest in the frontal lobe at and superior to the operculum though with parietal and temporal lobe involvement as well. IMPRESSION: CT HEAD: 1. No evidence of acute intracranial abnormality.  ASPECTS of 10. 2. Mild chronic small vessel ischemic disease. CTA HEAD AND NECK: 1. Decreased number of right MCA branch vessels without a discrete proximal occlusion identified. It is possible that this reflects a stump occlusion of a proximal to mid M2 branch which is not readily apparent by CTA. 2. Intracranial atherosclerosis with multiple anterior and posterior circulation stenoses as detailed above. 3. 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 4. Severe stenosis or short segment occlusion of the left vertebral artery at its origin with patency of the vessel more distally. Heavily diseased left V4 segment. 5. Cervical carotid atherosclerosis without significant stenosis. 6.  Aortic Atherosclerosis (ICD10-I70.0). CT BRAIN PERFUSION: 55 mL of ischemic penumbra in the right MCA territory without evidence of a core infarct. These results were called by telephone at the time of interpretation on 04/24/2020 at 2:15 pm to Dr. Donnetta Simpers, who verbally acknowledged these results. Electronically Signed   By: Logan Bores M.D.   On: 05/04/2020 14:58    ____________________________________________   PROCEDURES  Procedure(s) performed (including Critical Care):  .1-3 Lead EKG Interpretation Performed by: Lucrezia Starch, MD Authorized by: Lucrezia Starch, MD     Interpretation: abnormal     ECG rate assessment: normal     Rhythm: atrial fibrillation     Ectopy: none     Conduction: normal   .Critical Care Performed by: Lucrezia Starch, MD Authorized by: Lucrezia Starch, MD   Critical care provider statement:    Critical care time (minutes):  45   Critical care was necessary to treat or prevent imminent or life-threatening deterioration of the  following conditions:  Respiratory failure   Critical care was time spent personally by me on the following activities:  Discussions with consultants, evaluation of patient's response  to treatment, examination of patient, ordering and performing treatments and interventions, ordering and review of laboratory studies, ordering and review of radiographic studies, pulse oximetry, re-evaluation of patient's condition, obtaining history from patient or surrogate and review of old charts   ____________________________________________   INITIAL IMPRESSION / [place] / ED COURSE      Patient presents with above-stated history exam for assessment of some slurred speech in the setting of recent fall.  Patient is not exactly sure when he fell but it sounds like the speech became acutely abnormal around noon and he likely fell before this.  On arrival patient is hypoxic on room air at 88% on room air.  Given patient reports onset of slurred speech within 6 hours of arrival i.e. around 12 PM today he was made code stroke on arrival.  Please see neurology note for full details regarding patient's care evaluation assessment.  In brief patient was quickly taken back to CT after initial verification of stabilization of airway.  CT Noncon did not show any evidence of acute intracranial hemorrhage.  However on perfusion imaging there was concern per neurology of possible large infarct.   Per neurology they recommend transfer to [facility] or patient may undergo possible thrombectomy versus stent placement.  He was accepted by Dr. Charlsie Merles.   CBC remarkable for WBC count of 16.8, hemoglobin of 8.1 compared to 11 6 months ago and normal platelets.  CMP remarkable for BUN of 84 and a creatinine of 3 compared to 1.234 months ago.  INR is 2.2.  Ethanol susceptible.  Chest x-ray reviewed by myself shows no large pneumothorax, rib fracture but does show evidence of some pulmonary edema.  Given lower extremity my  exam I suspect patient may have a component of acute heart failure.  Shortly after chest x-ray obtained EMS arrived for transfer.  Given concern for CVA and need for emergent transfer for possible thrombectomy diuresis deferred in the ED at this time.   Patient was transferred in critical condition for further evaluation management to [facility] with concern for large vessel occlusion with hypoxic respiratory failure likely secondary to heart failure exacerbation.    _________________________________________   FINAL CLINICAL IMPRESSION(S) / ED DIAGNOSES  Final diagnoses:  Slurred speech  Fall, initial encounter  Anemia, unspecified type  Anticoagulated  Acute respiratory failure with hypoxia ([place])  AKI (acute kidney injury) ([place])  Cerebrovascular accident (CVA), unspecified mechanism ([place])    Medications  iohexol (OMNIPAQUE) 350 MG/ML injection 75 mL (75 mLs Intravenous Contrast Given 04/15/2020 1344)     ED Discharge Orders    None       Note:  This document was prepared using Dragon voice recognition software and may include unintentional dictation errors.   Lucrezia Starch, MD 04/11/2020 [phone]

## 2020-04-25 NOTE — Anesthesia Procedure Notes (Signed)
Procedure Name: Intubation Date/Time: 04/26/2020 3:38 PM Performed by: Suzy Bouchard, CRNA Pre-anesthesia Checklist: Patient identified, Emergency Drugs available, Suction available, Patient being monitored and Timeout performed Patient Re-evaluated:Patient Re-evaluated prior to induction Oxygen Delivery Method: Circle system utilized Preoxygenation: Pre-oxygenation with 100% oxygen Induction Type: IV induction and Rapid sequence Laryngoscope Size: Glidescope and 3 Grade View: Grade I Tube type: Oral Tube size: 7.5 mm Number of attempts: 1 Airway Equipment and Method: Stylet and Video-laryngoscopy Placement Confirmation: ETT inserted through vocal cords under direct vision,  positive ETCO2 and breath sounds checked- equal and bilateral Secured at: 23 cm Tube secured with: Tape Dental Injury: Teeth and Oropharynx as per pre-operative assessment  Comments: Old blood/dark secretions in back of oropharynx

## 2020-04-26 ENCOUNTER — Inpatient Hospital Stay (HOSPITAL_COMMUNITY): Payer: PPO

## 2020-04-26 DIAGNOSIS — I63411 Cerebral infarction due to embolism of right middle cerebral artery: Secondary | ICD-10-CM | POA: Diagnosis not present

## 2020-04-26 DIAGNOSIS — I639 Cerebral infarction, unspecified: Secondary | ICD-10-CM

## 2020-04-26 DIAGNOSIS — I482 Chronic atrial fibrillation, unspecified: Secondary | ICD-10-CM | POA: Diagnosis not present

## 2020-04-26 DIAGNOSIS — J9601 Acute respiratory failure with hypoxia: Secondary | ICD-10-CM

## 2020-04-26 DIAGNOSIS — I469 Cardiac arrest, cause unspecified: Secondary | ICD-10-CM

## 2020-04-26 DIAGNOSIS — Z01818 Encounter for other preprocedural examination: Secondary | ICD-10-CM

## 2020-04-26 DIAGNOSIS — E1165 Type 2 diabetes mellitus with hyperglycemia: Secondary | ICD-10-CM

## 2020-04-26 LAB — GLUCOSE, CAPILLARY
Glucose-Capillary: 121 mg/dL — ABNORMAL HIGH (ref 70–99)
Glucose-Capillary: 123 mg/dL — ABNORMAL HIGH (ref 70–99)
Glucose-Capillary: 123 mg/dL — ABNORMAL HIGH (ref 70–99)
Glucose-Capillary: 38 mg/dL — CL (ref 70–99)
Glucose-Capillary: 44 mg/dL — CL (ref 70–99)
Glucose-Capillary: 45 mg/dL — ABNORMAL LOW (ref 70–99)
Glucose-Capillary: 60 mg/dL — ABNORMAL LOW (ref 70–99)
Glucose-Capillary: 81 mg/dL (ref 70–99)
Glucose-Capillary: 89 mg/dL (ref 70–99)
Glucose-Capillary: 95 mg/dL (ref 70–99)
Glucose-Capillary: 99 mg/dL (ref 70–99)

## 2020-04-26 LAB — LIPID PANEL
Cholesterol: 57 mg/dL (ref 0–200)
HDL: <10 mg/dL — ABNORMAL LOW (ref >40)
Triglycerides: 180 mg/dL — ABNORMAL HIGH (ref <150)
VLDL: 36 mg/dL (ref 0–40)

## 2020-04-26 LAB — BASIC METABOLIC PANEL WITH GFR
Anion gap: 15 (ref 5–15)
BUN: 85 mg/dL — ABNORMAL HIGH (ref 8–23)
CO2: 17 mmol/L — ABNORMAL LOW (ref 22–32)
Calcium: 8.5 mg/dL — ABNORMAL LOW (ref 8.9–10.3)
Chloride: 109 mmol/L (ref 98–111)
Creatinine, Ser: 3.35 mg/dL — ABNORMAL HIGH (ref 0.61–1.24)
GFR, Estimated: 18 mL/min — ABNORMAL LOW
Glucose, Bld: 42 mg/dL — CL (ref 70–99)
Potassium: 4.9 mmol/L (ref 3.5–5.1)
Sodium: 141 mmol/L (ref 135–145)

## 2020-04-26 LAB — BASIC METABOLIC PANEL
Anion gap: 15 (ref 5–15)
BUN: 90 mg/dL — ABNORMAL HIGH (ref 8–23)
CO2: 16 mmol/L — ABNORMAL LOW (ref 22–32)
Calcium: 8.1 mg/dL — ABNORMAL LOW (ref 8.9–10.3)
Chloride: 108 mmol/L (ref 98–111)
Creatinine, Ser: 3.71 mg/dL — ABNORMAL HIGH (ref 0.61–1.24)
GFR, Estimated: 16 mL/min — ABNORMAL LOW (ref >60)
Glucose, Bld: 126 mg/dL — ABNORMAL HIGH (ref 70–99)
Potassium: 5 mmol/L (ref 3.5–5.1)
Sodium: 139 mmol/L (ref 135–145)

## 2020-04-26 LAB — PROTIME-INR
INR: 2.4 — ABNORMAL HIGH (ref 0.8–1.2)
Prothrombin Time: 25.5 s — ABNORMAL HIGH (ref 11.4–15.2)

## 2020-04-26 LAB — CBC
HCT: 27.5 % — ABNORMAL LOW (ref 39.0–52.0)
Hemoglobin: 8.6 g/dL — ABNORMAL LOW (ref 13.0–17.0)
MCH: 28 pg (ref 26.0–34.0)
MCHC: 31.3 g/dL (ref 30.0–36.0)
MCV: 89.6 fL (ref 80.0–100.0)
Platelets: 264 10*3/uL (ref 150–400)
RBC: 3.07 MIL/uL — ABNORMAL LOW (ref 4.22–5.81)
RDW: 15.2 % (ref 11.5–15.5)
WBC: 12.2 10*3/uL — ABNORMAL HIGH (ref 4.0–10.5)
nRBC: 0.2 % (ref 0.0–0.2)

## 2020-04-26 LAB — PHOSPHORUS: Phosphorus: 7.3 mg/dL — ABNORMAL HIGH (ref 2.5–4.6)

## 2020-04-26 LAB — HEMOGLOBIN A1C
Hgb A1c MFr Bld: 9.8 % — ABNORMAL HIGH (ref 4.8–5.6)
Mean Plasma Glucose: 234.56 mg/dL

## 2020-04-26 LAB — MAGNESIUM: Magnesium: 2.1 mg/dL (ref 1.7–2.4)

## 2020-04-26 IMAGING — DX DG CHEST 1V PORT
1 series · 1 of 1 positions shown · non-contrast
Comparison: [DATE]

CLINICAL DATA: Hypoxia

EXAM:
PORTABLE CHEST 1 VIEW

[chest]
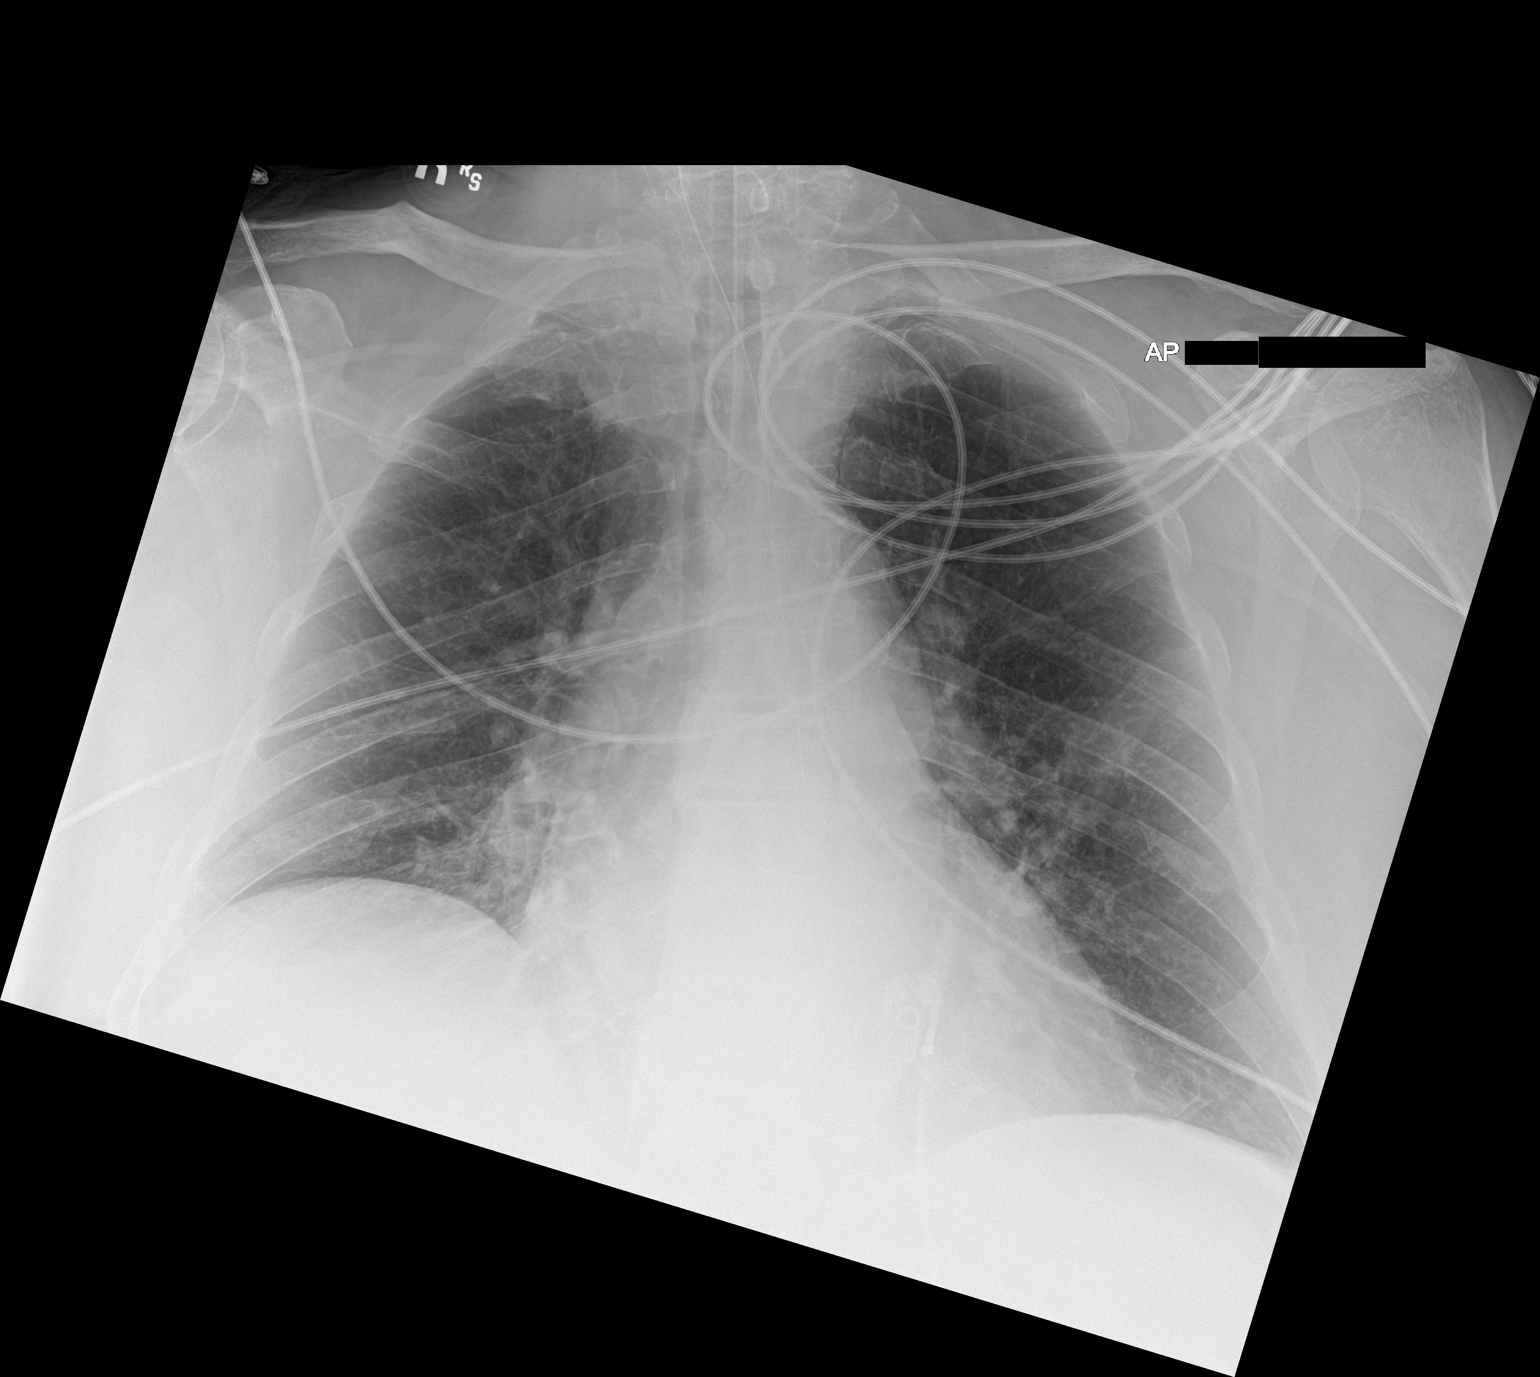

[1 of 1 positions shown; findings below may reference images not displayed]

FINDINGS: The endotracheal tube terminates above the carina. There is
elevation of the right hemidiaphragm as before. There are persistent
but improving perihilar airspace opacities. There is no
pneumothorax. No definite acute displaced fracture. No large pleural
effusion.
IMPRESSION: 1. Improving bilateral perihilar airspace opacities.
2. Endotracheal tube as above.

## 2020-04-26 IMAGING — MR MR HEAD W/O CM
12 of 13 series · 44 of 48 positions shown · non-contrast
Comparison: CT a of the head neck from yesterday

CLINICAL DATA: Stroke follow-up.

EXAM:
MRI HEAD WITHOUT CONTRAST
MRA HEAD WITHOUT CONTRAST
TECHNIQUE: Multiplanar, multiecho pulse sequences of the brain and surrounding
structures were obtained without intravenous contrast. Angiographic
images of the head were obtained using MRA technique without
contrast.

[Series 5: DWI · axial · 3.0mm · 0.92mm/px · z∈[-62,+81]mm · 8 of 100 slices shown (1 of 4)]
[im 1/100]
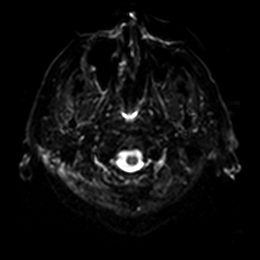
[im 15/100]
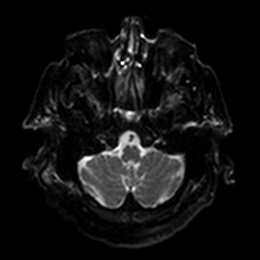
[im 29/100]
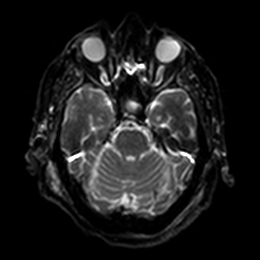
[im 43/100]
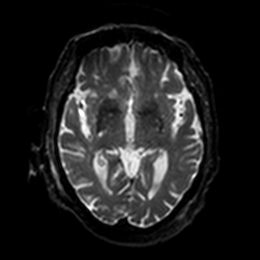
[im 57/100]
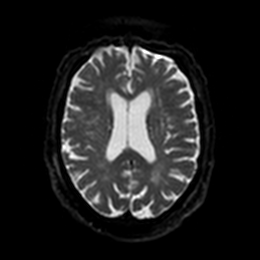
[im 71/100]
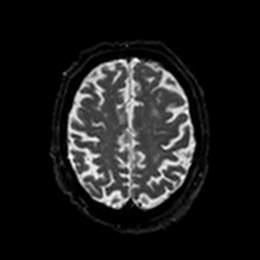
[im 85/100]
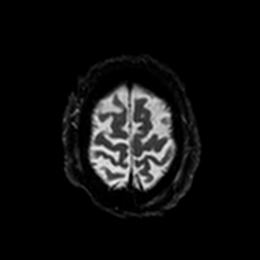
[im 100/100]
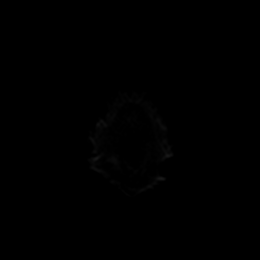

[Series 6: DWI · axial · 3.0mm · 0.92mm/px · z∈[-62,+81]mm · 4 of 50 slices shown (2 of 4)]
[im 1/50]
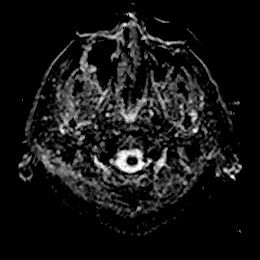
[im 17/50]
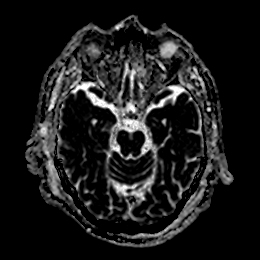
[im 33/50]
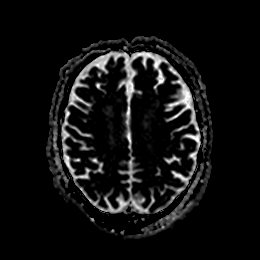
[im 50/50]
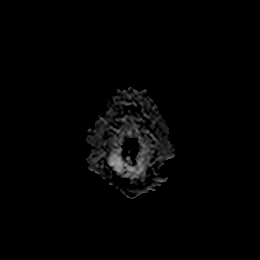

[Series 7: T1 · sagittal · 5.0mm · 0.78mm/px · 2 of 25 slices shown]
[im 1/25]
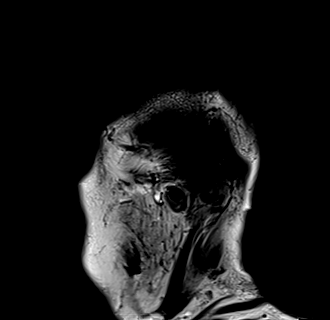
[im 25/25]
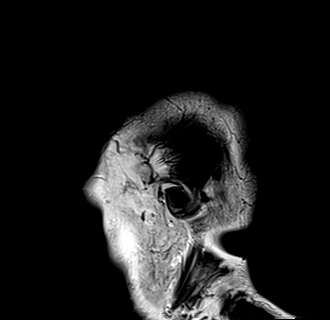

[Series 8: DWI · coronal · 4.0mm · 0.88mm/px · 5 of 66 slices shown (3 of 4)]
[im 1/66]
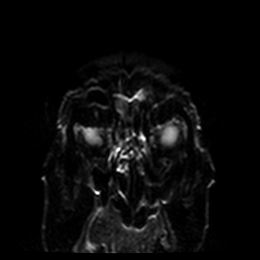
[im 17/66]
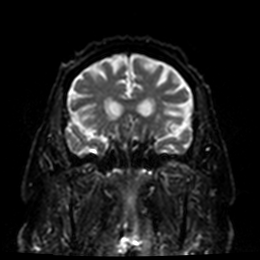
[im 33/66]
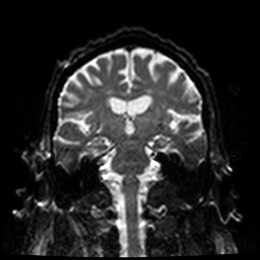
[im 49/66]
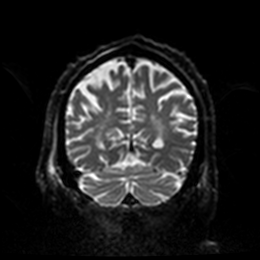
[im 66/66]
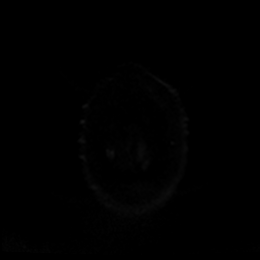

[Series 9: DWI · coronal · 4.0mm · 0.88mm/px · 3 of 33 slices shown (4 of 4)]
[im 1/33]
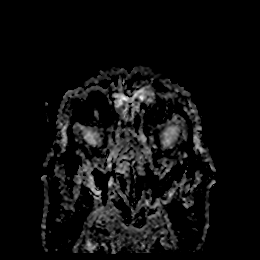
[im 17/33]
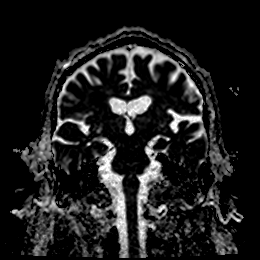
[im 33/33]
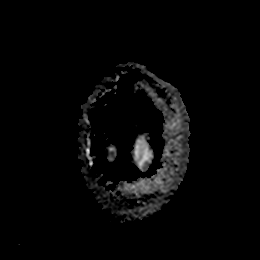

[Series 10: T2 · axial · 5.0mm · 0.72mm/px · z∈[-78,+75]mm · 2 of 27 slices shown (1 of 2)]
[im 1/27]
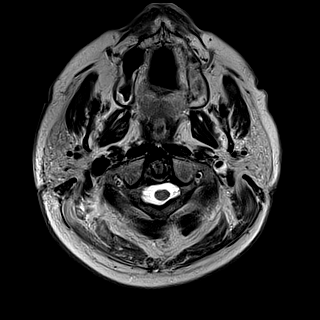
[im 27/27]
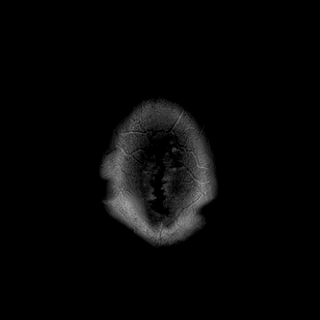

[Series 11: FLAIR · axial · 5.0mm · 0.90mm/px · z∈[-78,+75]mm · 2 of 27 slices shown]
[im 1/27]
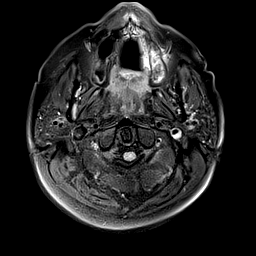
[im 27/27]
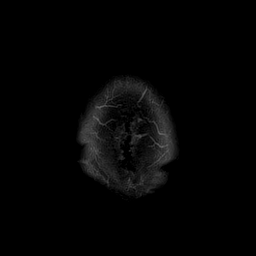

[Series 12: mag_images · axial · 3.0mm · 0.90mm/px · z∈[-83,+79]mm · 4 of 56 slices shown]
[im 1/56]
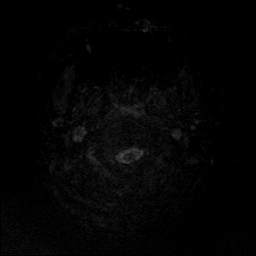
[im 19/56]
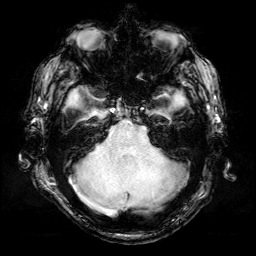
[im 37/56]
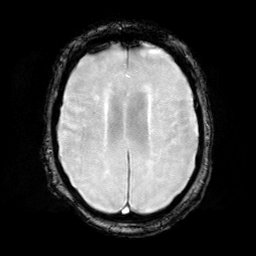
[im 56/56]
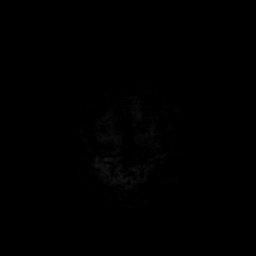

[Series 13: pha_images · axial · 3.0mm · 0.90mm/px · z∈[-83,+79]mm · 4 of 56 slices shown]
[im 1/56]
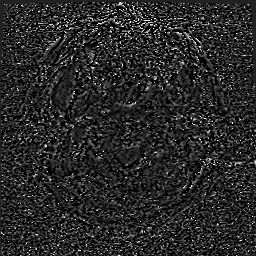
[im 19/56]
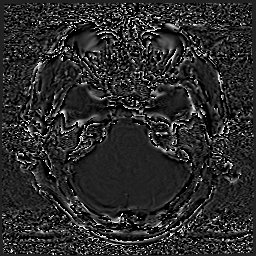
[im 37/56]
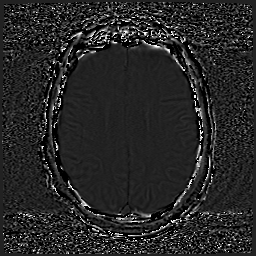
[im 56/56]
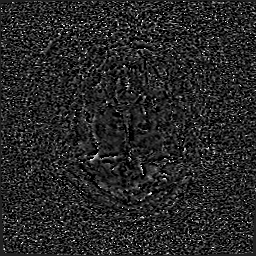

[Series 14: swi_images · axial · 3.0mm · 0.90mm/px · z∈[-83,+79]mm · 4 of 56 slices shown]
[im 1/56]
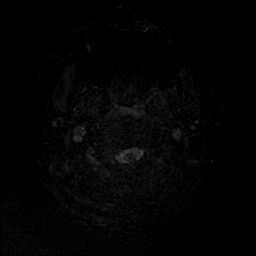
[im 19/56]
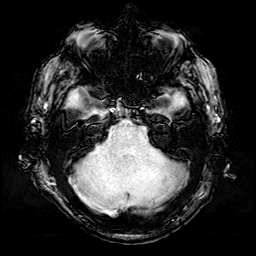
[im 37/56]
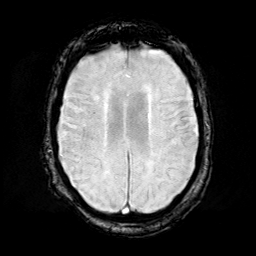
[im 56/56]
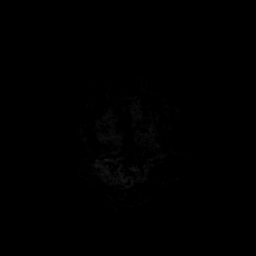

[Series 15: mip_images(sw) · axial · 24.0mm · 0.90mm/px · z∈[-72,+69]mm · 4 of 49 slices shown]
[im 1/49]
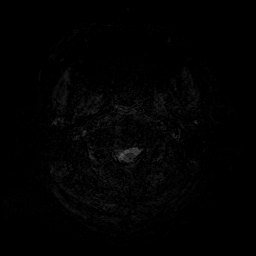
[im 17/49]
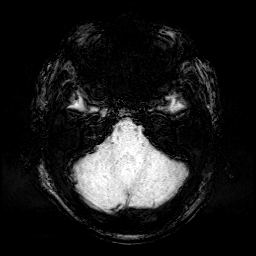
[im 33/49]
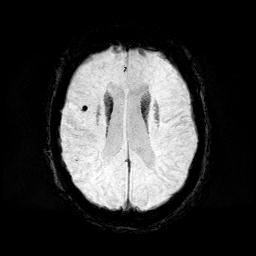
[im 49/49]
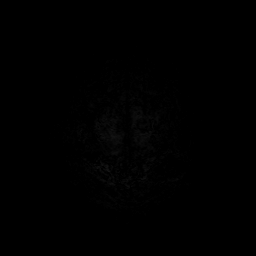

[Series 17: T2 · coronal · 5.0mm · 0.36mm/px · 2 of 31 slices shown (2 of 2)]
[im 1/31]
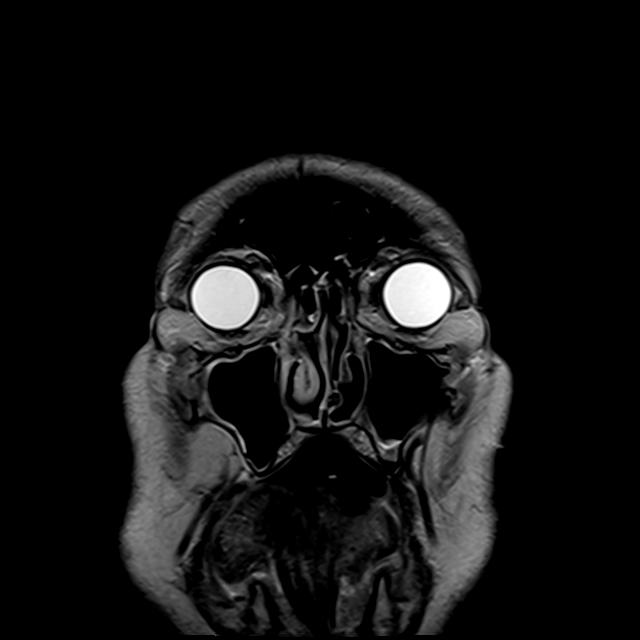
[im 31/31]
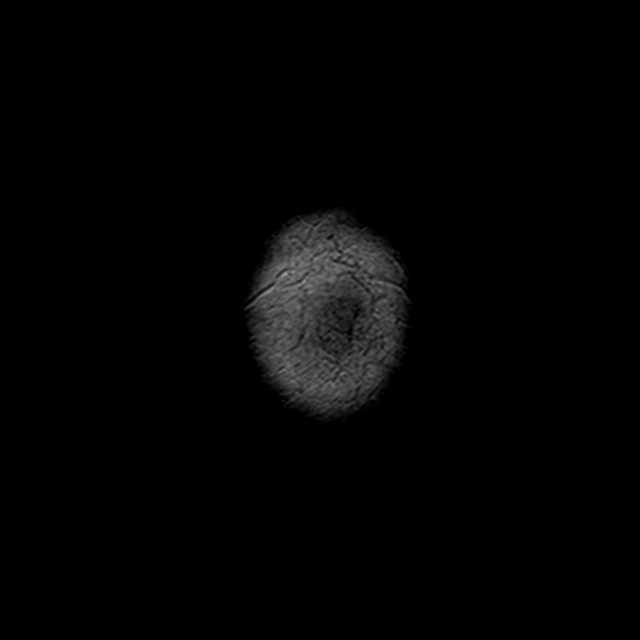

[44 of 48 positions shown; findings below may reference images not displayed]

FINDINGS: MRI HEAD FINDINGS

Brain: Patchy cortical infarction along the right MCA territory and
reaching the right occipital cortex in this patient with large right
posterior communicating artery. Two tiny cortical infarcts in the
left MCA to distal ACA territory. No hemorrhage, mass effect, or
hydrocephalus. Age normal brain volume. Mild pre-existing ischemic
disease for age.

Vascular: Arterial findings below.

Skull and upper cervical spine: Normal marrow signal

Sinuses/Orbits: Generalized mucosal thickening with fluid levels.

MRA HEAD FINDINGS

Right M2 stenting with expected associated artifact but visible
internal flow on source images. On 3D reformats there is apparent
high-grade stenosis with flow gap in the region of the M2 takeoff,
but this is likely primarily from artifact based on source images.
There is moderate atheromatous irregularity of bilateral MCA
branches.

Diffuse atheromatous irregularity of the left V4 segment and basilar
with overall moderate stenosis except at the vertebrobasilar
junction where narrowing appears high-grade. The right V4 segment is
not seen until the patent PICA.
IMPRESSION: 1. Scattered acute cortical infarcts in the right MCA territory and
right occipital lobe. Two tiny cortical infarcts in the left
cerebral hemisphere.
2. Present flow in the stented right M2 branch.
3. Advanced intracranial atherosclerosis as previously
characterized.
4. Sinus inflammation.

## 2020-04-26 IMAGING — MR MR MRA HEAD W/O CM
1 series · 19 of 48 positions shown · non-contrast
Comparison: CT a of the head neck from yesterday

CLINICAL DATA: Stroke follow-up.

EXAM:
MRI HEAD WITHOUT CONTRAST
MRA HEAD WITHOUT CONTRAST
TECHNIQUE: Multiplanar, multiecho pulse sequences of the brain and surrounding
structures were obtained without intravenous contrast. Angiographic
images of the head were obtained using MRA technique without
contrast.

[Series 5: 3d cow · axial · 0.5mm · 0.43mm/px · z∈[-79,+25]mm · 19 of 224 slices shown]
[im 1/224]
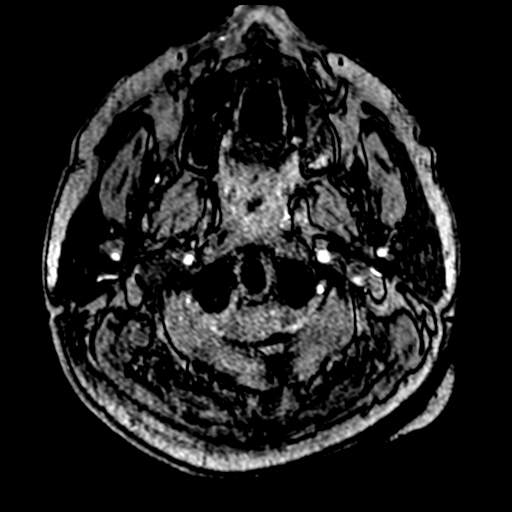
[im 5/224]
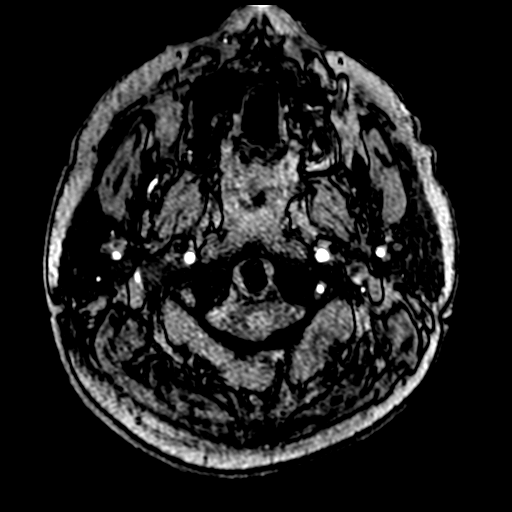
[im 10/224]
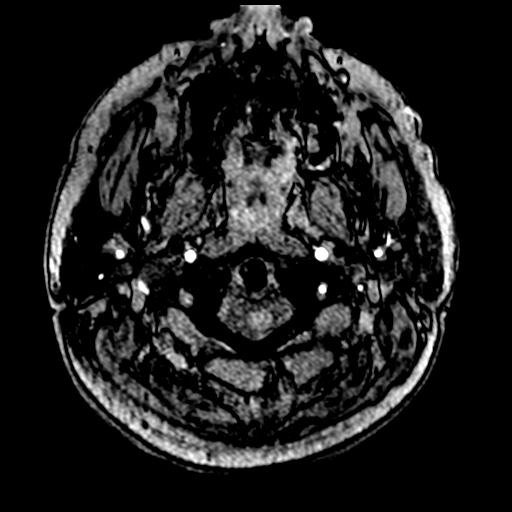
[im 15/224]
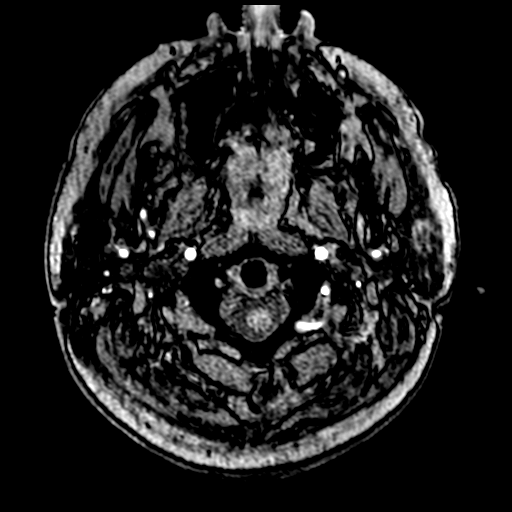
[im 19/224]
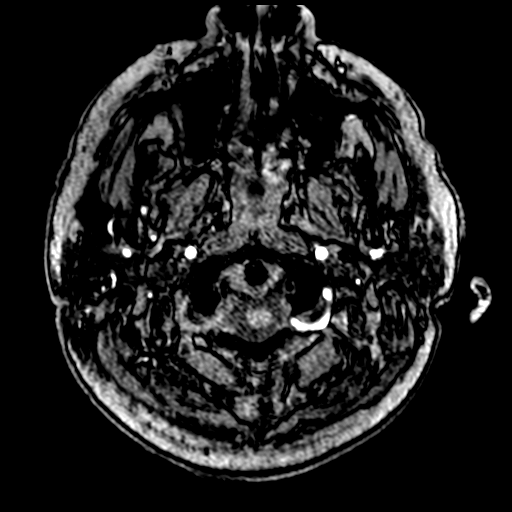
[im 24/224]
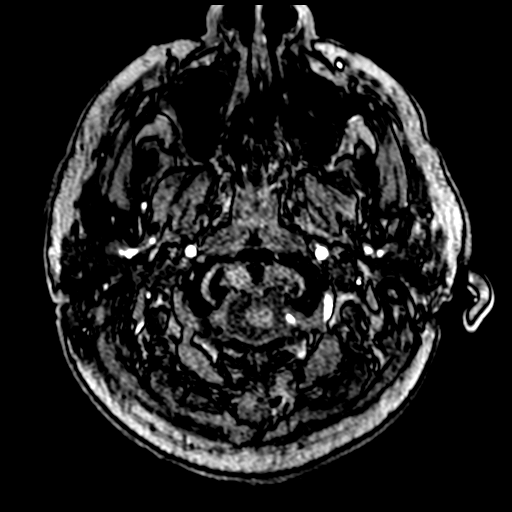
[im 29/224]
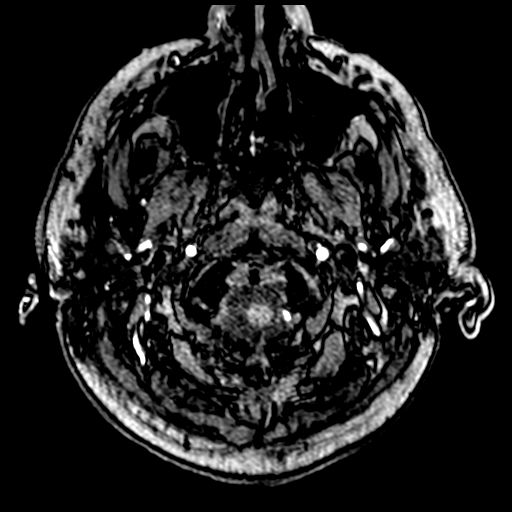
[im 34/224]
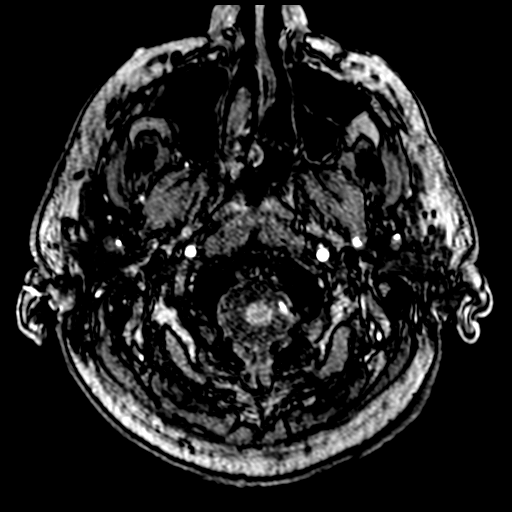
[im 38/224]
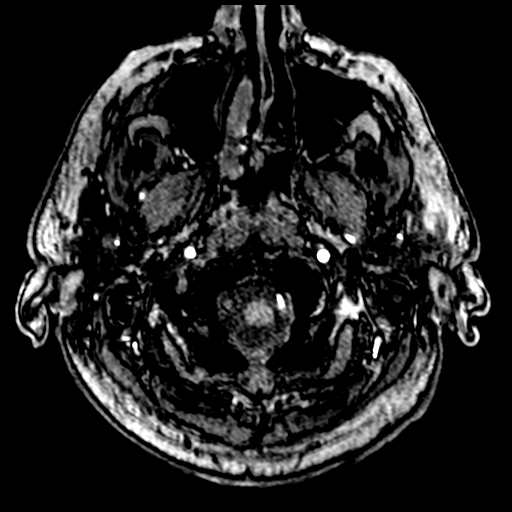
[im 43/224]
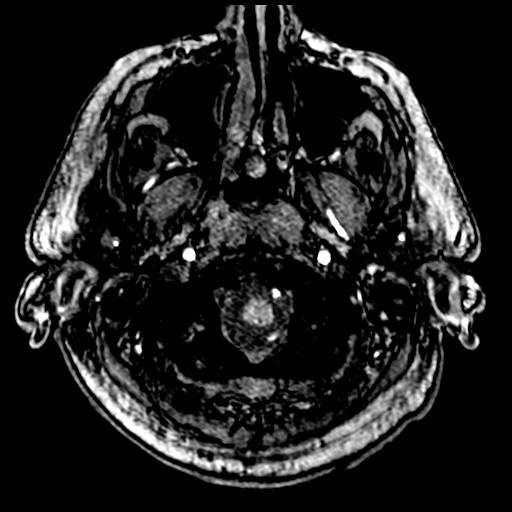
[im 48/224]
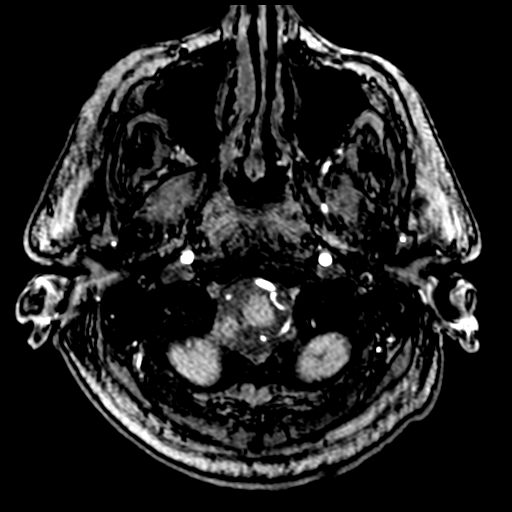
[im 72/224]
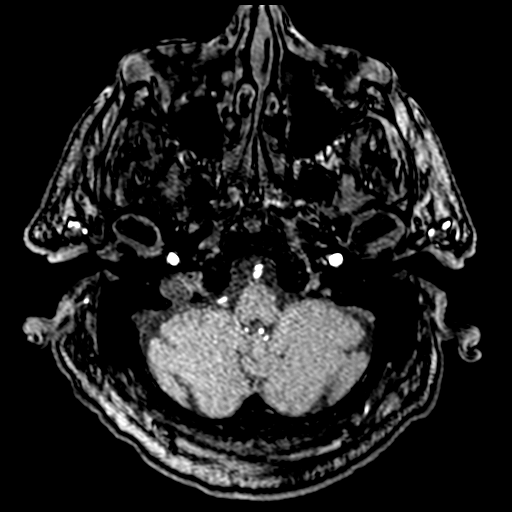
[im 100/224]
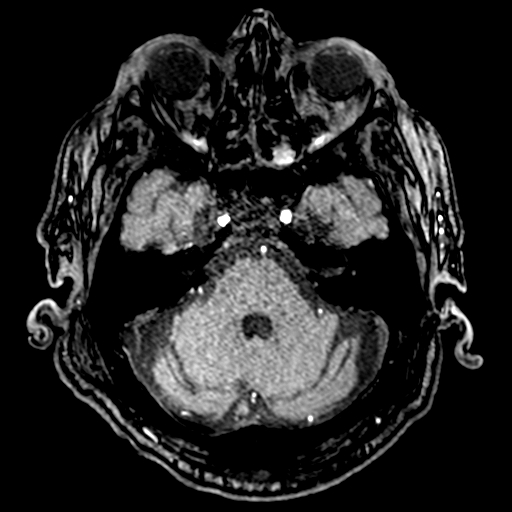
[im 114/224]
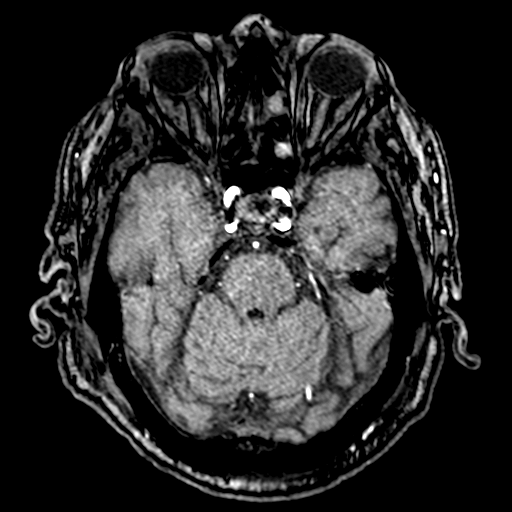
[im 129/224]
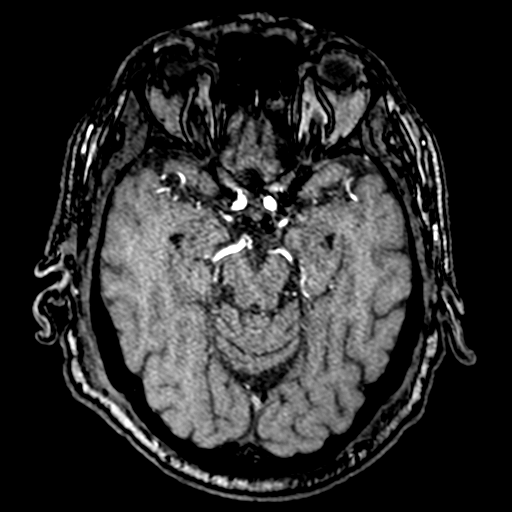
[im 157/224]
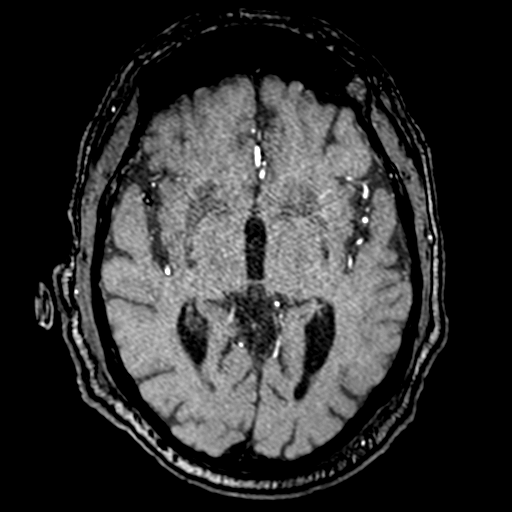
[im 186/224]
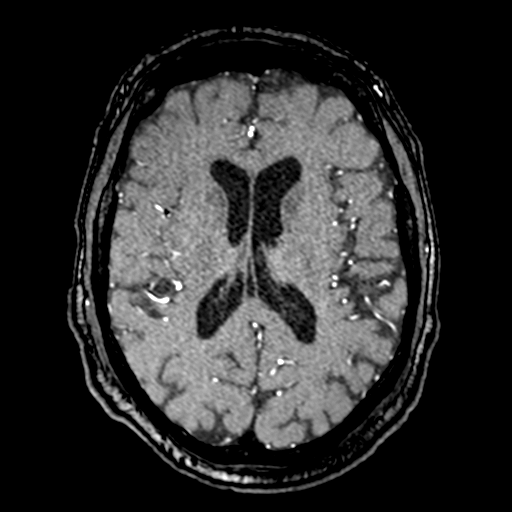
[im 190/224]
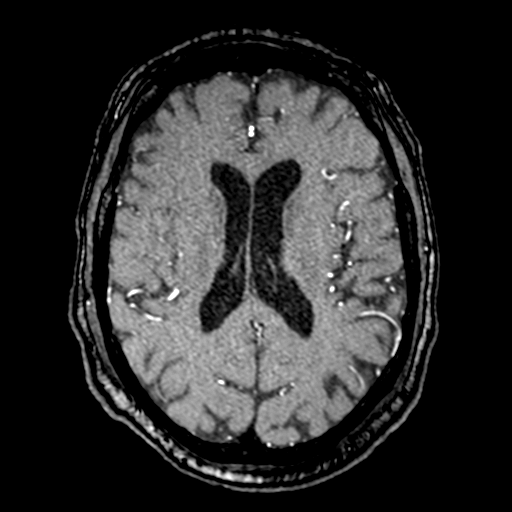
[im 214/224]
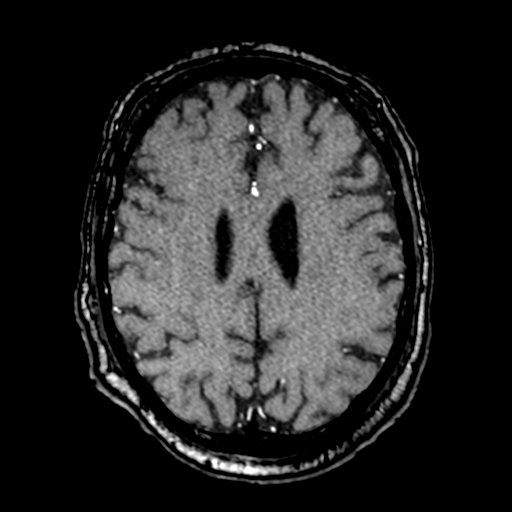

[19 of 48 positions shown; findings below may reference images not displayed]

FINDINGS: MRI HEAD FINDINGS

Brain: Patchy cortical infarction along the right MCA territory and
reaching the right occipital cortex in this patient with large right
posterior communicating artery. Two tiny cortical infarcts in the
left MCA to distal ACA territory. No hemorrhage, mass effect, or
hydrocephalus. Age normal brain volume. Mild pre-existing ischemic
disease for age.

Vascular: Arterial findings below.

Skull and upper cervical spine: Normal marrow signal

Sinuses/Orbits: Generalized mucosal thickening with fluid levels.

MRA HEAD FINDINGS

Right M2 stenting with expected associated artifact but visible
internal flow on source images. On 3D reformats there is apparent
high-grade stenosis with flow gap in the region of the M2 takeoff,
but this is likely primarily from artifact based on source images.
There is moderate atheromatous irregularity of bilateral MCA
branches.

Diffuse atheromatous irregularity of the left V4 segment and basilar
with overall moderate stenosis except at the vertebrobasilar
junction where narrowing appears high-grade. The right V4 segment is
not seen until the patent PICA.
IMPRESSION: 1. Scattered acute cortical infarcts in the right MCA territory and
right occipital lobe. Two tiny cortical infarcts in the left
cerebral hemisphere.
2. Present flow in the stented right M2 branch.
3. Advanced intracranial atherosclerosis as previously
characterized.
4. Sinus inflammation.

## 2020-04-26 IMAGING — DX DG ABD PORTABLE 1V
1 series · 1 of 1 positions shown · non-contrast
Comparison: None.

CLINICAL DATA: OG tube placement.

EXAM:
PORTABLE ABDOMEN - 1 VIEW

[abdomen]
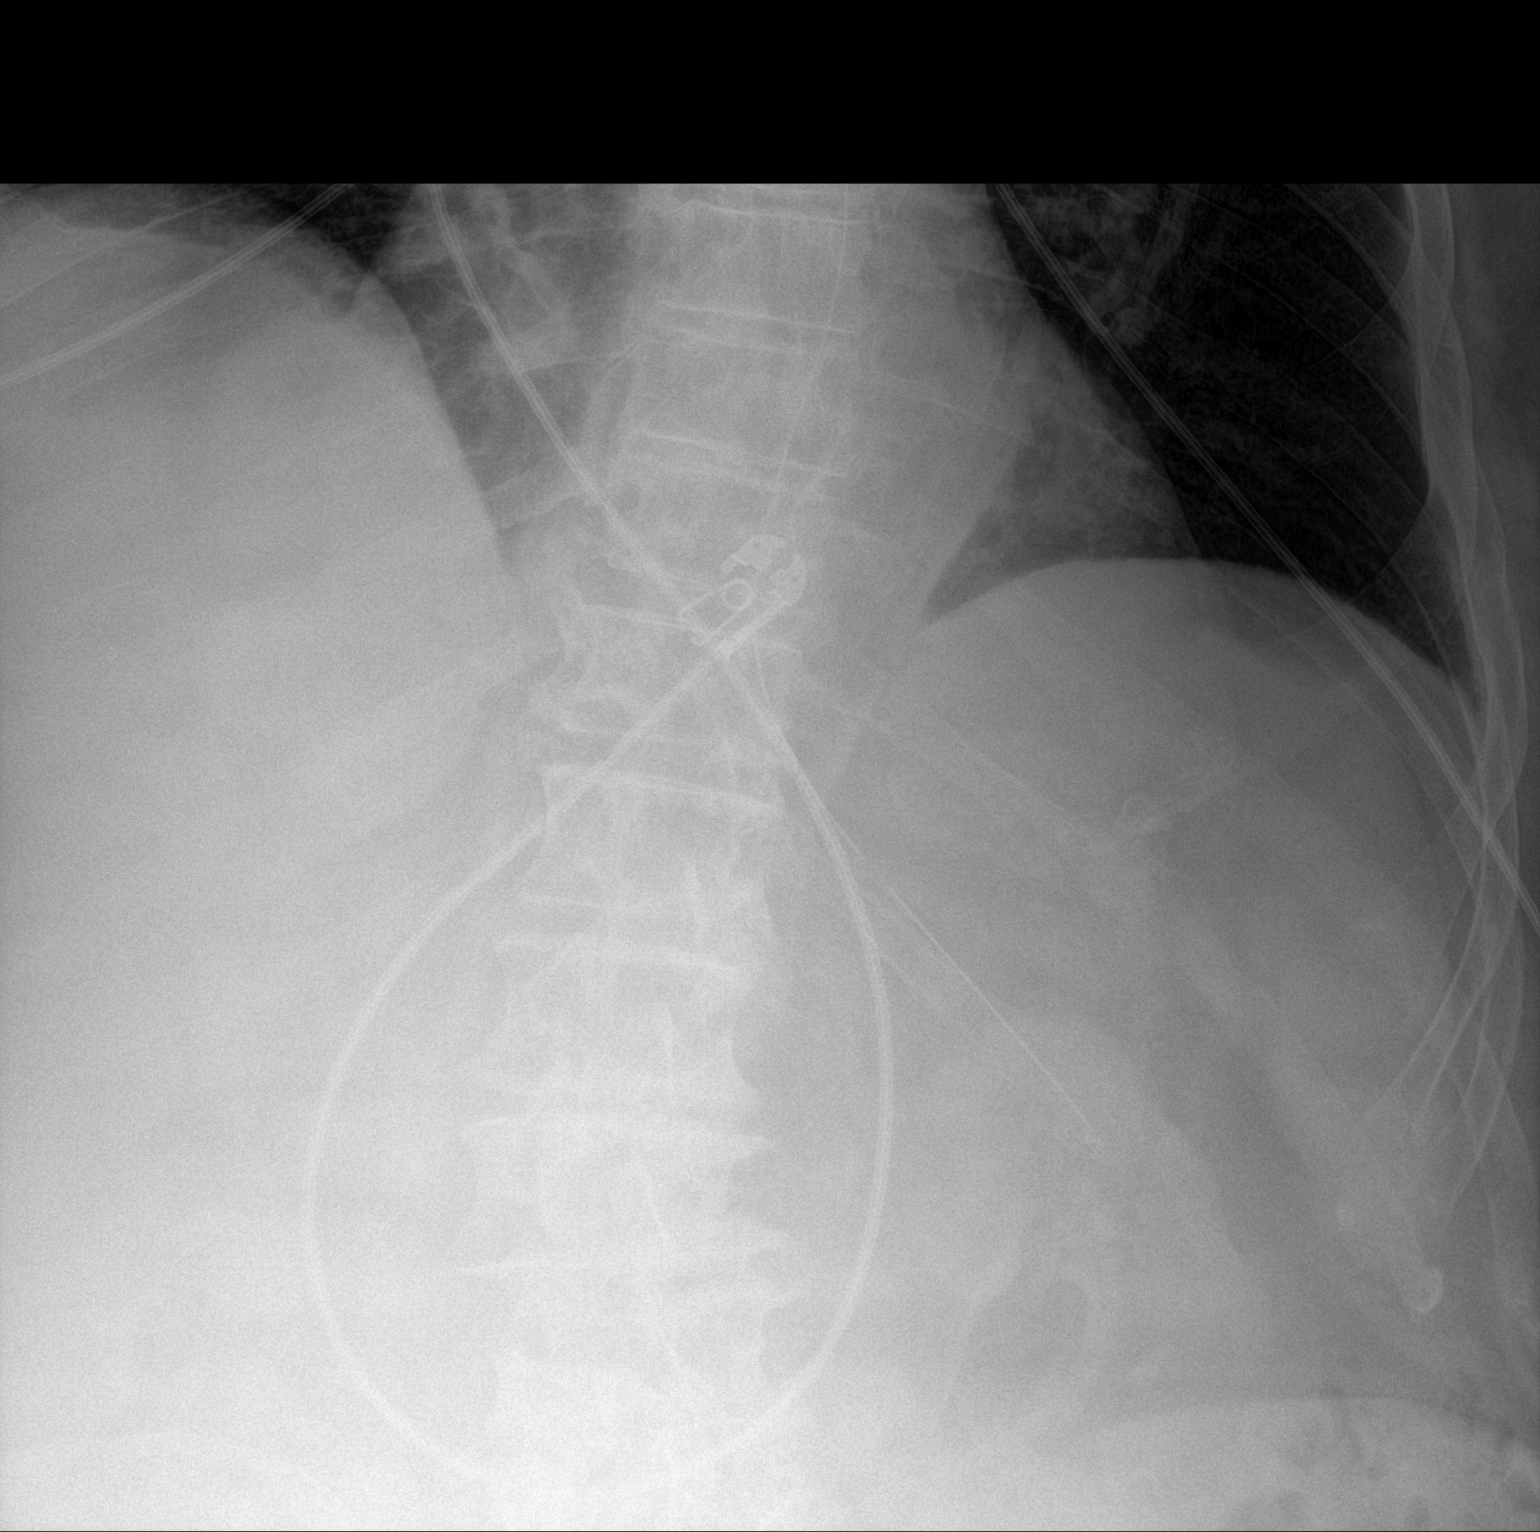

[1 of 1 positions shown; findings below may reference images not displayed]

FINDINGS: An enteric tube is identified with tip overlying the proximal-mid
stomach.
IMPRESSION: Enteric tube with tip overlying the proximal-mid stomach.

## 2020-04-26 MED ORDER — DEXTROSE 50 % IV SOLN
INTRAVENOUS | Status: AC
  Administered 2020-04-26: 50 mL
  Filled 2020-04-26: qty 50

## 2020-04-26 MED ORDER — TICAGRELOR 90 MG PO TABS
90.0000 mg | ORAL_TABLET | Freq: Two times a day (BID) | ORAL | Status: DC
  Administered 2020-04-26 – 2020-05-01 (×11): 90 mg
  Filled 2020-04-26 (×11): qty 1

## 2020-04-26 MED ORDER — ALBUMIN HUMAN 5 % IV SOLN
INTRAVENOUS | Status: AC
  Filled 2020-04-26: qty 250

## 2020-04-26 MED ORDER — DEXMEDETOMIDINE HCL IN NACL 400 MCG/100ML IV SOLN
0.4000 ug/kg/h | INTRAVENOUS | Status: DC
  Administered 2020-04-26: 0.4 ug/kg/h via INTRAVENOUS
  Administered 2020-04-27 (×2): 1.2 ug/kg/h via INTRAVENOUS
  Administered 2020-04-27: 0.8 ug/kg/h via INTRAVENOUS
  Administered 2020-04-27 (×2): 1.2 ug/kg/h via INTRAVENOUS
  Administered 2020-04-27: 0.8 ug/kg/h via INTRAVENOUS
  Administered 2020-04-27 – 2020-04-28 (×6): 1.2 ug/kg/h via INTRAVENOUS
  Administered 2020-04-28: 0.708 ug/kg/h via INTRAVENOUS
  Filled 2020-04-26 (×8): qty 100
  Filled 2020-04-26: qty 200
  Filled 2020-04-26 (×5): qty 100

## 2020-04-26 MED ORDER — TICAGRELOR 90 MG PO TABS
180.0000 mg | ORAL_TABLET | Freq: Once | ORAL | Status: AC
  Administered 2020-04-26: 180 mg
  Filled 2020-04-26: qty 2

## 2020-04-26 MED ORDER — LABETALOL HCL 5 MG/ML IV SOLN
10.0000 mg | INTRAVENOUS | Status: DC | PRN
  Administered 2020-04-26: 10 mg via INTRAVENOUS
  Filled 2020-04-26 (×6): qty 4

## 2020-04-26 MED ORDER — ASPIRIN 81 MG PO CHEW: 81.0000 mg | CHEWABLE_TABLET | Freq: Every day | ORAL | Status: DC

## 2020-04-26 MED ORDER — DEXTROSE 10 % IV SOLN: INTRAVENOUS | Status: DC

## 2020-04-26 MED ORDER — TICAGRELOR 90 MG PO TABS: 180.0000 mg | ORAL_TABLET | Freq: Once | ORAL | Status: DC

## 2020-04-26 MED ORDER — TICAGRELOR 90 MG PO TABS: 90.0000 mg | ORAL_TABLET | Freq: Two times a day (BID) | ORAL | Status: DC

## 2020-04-26 MED ORDER — LACTATED RINGERS IV BOLUS
1000.0000 mL | Freq: Once | INTRAVENOUS | Status: AC
  Administered 2020-04-26: 1000 mL via INTRAVENOUS

## 2020-04-26 MED ORDER — DEXTROSE 50 % IV SOLN
INTRAVENOUS | Status: AC
  Administered 2020-04-26: 50 mL via INTRAVENOUS
  Filled 2020-04-26: qty 50

## 2020-04-26 MED ORDER — ASPIRIN 81 MG PO CHEW
81.0000 mg | CHEWABLE_TABLET | Freq: Every day | ORAL | Status: DC
  Administered 2020-04-26 – 2020-05-01 (×6): 81 mg
  Filled 2020-04-26 (×6): qty 1

## 2020-04-26 MED ORDER — CLEVIDIPINE BUTYRATE 0.5 MG/ML IV EMUL
0.0000 mg/h | INTRAVENOUS | Status: DC
  Administered 2020-04-26: 10 mg/h via INTRAVENOUS
  Administered 2020-04-26: 15 mg/h via INTRAVENOUS
  Administered 2020-04-26: 1 mg/h via INTRAVENOUS
  Administered 2020-04-27: 10 mg/h via INTRAVENOUS
  Administered 2020-04-27: 8 mg/h via INTRAVENOUS
  Administered 2020-04-27: 2 mg/h via INTRAVENOUS
  Administered 2020-04-28 (×2): 10 mg/h via INTRAVENOUS
  Filled 2020-04-26 (×8): qty 50

## 2020-04-26 MED ORDER — VITAL HIGH PROTEIN PO LIQD
1000.0000 mL | ORAL | Status: DC
  Administered 2020-04-26 – 2020-04-27 (×2): 1000 mL

## 2020-04-26 MED ORDER — HEPARIN SODIUM (PORCINE) 5000 UNIT/ML IJ SOLN
5000.0000 [IU] | Freq: Three times a day (TID) | INTRAMUSCULAR | Status: DC
  Administered 2020-04-26 – 2020-05-02 (×18): 5000 [IU] via SUBCUTANEOUS
  Filled 2020-04-26 (×18): qty 1

## 2020-04-26 MED ORDER — PANTOPRAZOLE SODIUM 40 MG PO PACK
40.0000 mg | PACK | Freq: Every day | ORAL | Status: DC
  Administered 2020-04-26 – 2020-05-01 (×6): 40 mg
  Filled 2020-04-26 (×6): qty 20

## 2020-04-26 MED ORDER — PROSOURCE TF PO LIQD
45.0000 mL | Freq: Two times a day (BID) | ORAL | Status: DC
  Administered 2020-04-26 – 2020-05-01 (×10): 45 mL
  Filled 2020-04-26 (×10): qty 45

## 2020-04-26 NOTE — Progress Notes (Signed)
STROKE TEAM PROGRESS NOTE   SUBJECTIVE (INTERVAL HISTORY) His RN and Dr. Lynetta Mare are at the bedside.  Pt still intubated, on propofol. BP stable within 120-140. However, not responsive, even with propofol on hold per RN. MRI done showed scattered right MCA infarcts. Right M2 stent patent. No bleeding. Cangerlo discontinued and started ASA and brilinta.     OBJECTIVE Temp:  [93 F (33.9 C)-98.96 F (37.2 C)] 97.7 F (36.5 C) (02/20 1115) Pulse Rate:  [82-112] 87 (02/20 1115) Cardiac Rhythm: Atrial fibrillation (02/20 0800) Resp:  [10-33] 23 (02/20 1115) BP: (98-178)/(38-89) 123/71 (02/20 1000) SpO2:  [87 %-100 %] 99 % (02/20 1115) Arterial Line BP: (102-172)/(37-67) 121/50 (02/20 1115) FiO2 (%):  [40 %-100 %] 40 % (02/20 0816) Weight:  [id number] kg] 113 kg (02/19 2200)  Recent Labs  Lab 04/26/20 0409 04/26/20 0447 04/26/20 0744 04/26/20 0829 04/26/20 1118  GLUCAP 38* 81 45* 89 60*   Recent Labs  Lab 04/20/2020 1417 04/12/2020 1609 04/22/2020 1809 04/23/2020 2010 04/23/2020 2043 04/26/20 0423  NA 137 139 137 142 141 141  K 4.8 5.3* 7.2* 5.7* 5.5* 4.9  CL 104  --   --  109  --  109  CO2 18*  --   --  19*  --  17*  GLUCOSE 76  --   --  89  --  42*  BUN 84*  --   --  81*  --  85*  CREATININE 3.00*  --   --  3.17*  --  3.35*  CALCIUM 8.5*  --   --  9.9  --  8.5*   Recent Labs  Lab 04/29/2020 1417  AST 37  ALT 28  ALKPHOS 66  BILITOT 0.7  PROT 6.1*  ALBUMIN 2.4*   Recent Labs  Lab 04/30/2020 1417 04/16/2020 1609 04/19/2020 1809 05/01/2020 2010 04/07/2020 2043 04/26/20 0423  WBC 16.8*  --   --  16.1*  --  12.2*  NEUTROABS 15.3*  --   --   --   --   --   HGB 8.1* 8.2* 6.8* 7.9* 7.5* 8.6*  HCT 25.0* 24.0* 20.0* 25.3* 22.0* 27.5*  MCV 88.0  --   --  90.7  --  89.6  PLT 251  --   --  226  --  264   No results for input(s): CKTOTAL, CKMB, CKMBINDEX, TROPONINI in the last 168 hours. Recent Labs    05/01/2020 1417 04/26/20 0206  LABPROT 23.8* 25.5*  INR 2.2* 2.4*   No results for  input(s): COLORURINE, LABSPEC, PHURINE, GLUCOSEU, HGBUR, BILIRUBINUR, KETONESUR, PROTEINUR, UROBILINOGEN, NITRITE, LEUKOCYTESUR in the last 72 hours.  Invalid input(s): APPERANCEUR     Component Value Date/Time   CHOL 57 04/26/2020 0423   TRIG 180 (H) 04/26/2020 0423   HDL <10 (L) 04/26/2020 0423   CHOLHDL NOT CALCULATED 04/26/2020 0423   VLDL 36 04/26/2020 0423   LDLCALC NOT CALCULATED 04/26/2020 0423   LDLCALC 121 (H) 07/16/2019 0751   Lab Results  Component Value Date   HGBA1C 9.8 (H) 04/26/2020   No results found for: LABOPIA, COCAINSCRNUR, [place], [place], [facility], [place]  Recent Labs  Lab 05/04/2020 [place] <10    I have personally reviewed the radiological images below and agree with the radiology interpretations.  DG Chest 1 View  Result Date: 04/11/2020 CLINICAL DATA:  Acute shortness of breath.  CVA. EXAM: CHEST  1 VIEW COMPARISON:  07/08/2019 chest radiograph FINDINGS: This is a low volume study. The cardiomediastinal silhouette  is unchanged. Mild pulmonary vascular congestion noted. There is no evidence of focal airspace disease, pulmonary edema, suspicious pulmonary nodule/mass, pleural effusion, or pneumothorax. No acute bony abnormalities are identified. IMPRESSION: Mild pulmonary vascular congestion. Electronically Signed   By: Margarette Canada M.D.   On: 04/10/2020 15:02   MR ANGIO HEAD WO CONTRAST  Result Date: 04/26/2020 CLINICAL DATA:  Stroke follow-up. EXAM: MRI HEAD WITHOUT CONTRAST MRA HEAD WITHOUT CONTRAST TECHNIQUE: Multiplanar, multiecho pulse sequences of the brain and surrounding structures were obtained without intravenous contrast. Angiographic images of the head were obtained using MRA technique without contrast. COMPARISON:  CT a of the head neck from yesterday FINDINGS: MRI HEAD FINDINGS Brain: Patchy cortical infarction along the right MCA territory and reaching the right occipital cortex in this patient with large right posterior communicating artery. Two  tiny cortical infarcts in the left MCA to distal ACA territory. No hemorrhage, mass effect, or hydrocephalus. Age normal brain volume. Mild pre-existing ischemic disease for age. Vascular: Arterial findings below. Skull and upper cervical spine: Normal marrow signal Sinuses/Orbits: Generalized mucosal thickening with fluid levels. MRA HEAD FINDINGS Right M2 stenting with expected associated artifact but visible internal flow on source images. On 3D reformats there is apparent high-grade stenosis with flow gap in the region of the M2 takeoff, but this is likely primarily from artifact based on source images. There is moderate atheromatous irregularity of bilateral MCA branches. Diffuse atheromatous irregularity of the left V4 segment and basilar with overall moderate stenosis except at the vertebrobasilar junction where narrowing appears high-grade. The right V4 segment is not seen until the patent PICA. IMPRESSION: 1. Scattered acute cortical infarcts in the right MCA territory and right occipital lobe. Two tiny cortical infarcts in the left cerebral hemisphere. 2. Present flow in the stented right M2 branch. 3. Advanced intracranial atherosclerosis as previously characterized. 4. Sinus inflammation. Electronically Signed   By: Monte Fantasia M.D.   On: 04/26/2020 04:34   MR BRAIN WO CONTRAST  Result Date: 04/26/2020 CLINICAL DATA:  Stroke follow-up. EXAM: MRI HEAD WITHOUT CONTRAST MRA HEAD WITHOUT CONTRAST TECHNIQUE: Multiplanar, multiecho pulse sequences of the brain and surrounding structures were obtained without intravenous contrast. Angiographic images of the head were obtained using MRA technique without contrast. COMPARISON:  CT a of the head neck from yesterday FINDINGS: MRI HEAD FINDINGS Brain: Patchy cortical infarction along the right MCA territory and reaching the right occipital cortex in this patient with large right posterior communicating artery. Two tiny cortical infarcts in the left MCA to  distal ACA territory. No hemorrhage, mass effect, or hydrocephalus. Age normal brain volume. Mild pre-existing ischemic disease for age. Vascular: Arterial findings below. Skull and upper cervical spine: Normal marrow signal Sinuses/Orbits: Generalized mucosal thickening with fluid levels. MRA HEAD FINDINGS Right M2 stenting with expected associated artifact but visible internal flow on source images. On 3D reformats there is apparent high-grade stenosis with flow gap in the region of the M2 takeoff, but this is likely primarily from artifact based on source images. There is moderate atheromatous irregularity of bilateral MCA branches. Diffuse atheromatous irregularity of the left V4 segment and basilar with overall moderate stenosis except at the vertebrobasilar junction where narrowing appears high-grade. The right V4 segment is not seen until the patent PICA. IMPRESSION: 1. Scattered acute cortical infarcts in the right MCA territory and right occipital lobe. Two tiny cortical infarcts in the left cerebral hemisphere. 2. Present flow in the stented right M2 branch. 3. Advanced intracranial atherosclerosis as previously  characterized. 4. Sinus inflammation. Electronically Signed   By: Monte Fantasia M.D.   On: 04/26/2020 04:34   CT CEREBRAL PERFUSION W CONTRAST  Result Date: 04/24/2020 CLINICAL DATA:  Slurred speech and left-sided weakness. EXAM: CT ANGIOGRAPHY HEAD AND NECK CT PERFUSION BRAIN TECHNIQUE: Multidetector CT imaging of the head and neck was performed using the standard protocol during bolus administration of intravenous contrast. Multiplanar CT image reconstructions and MIPs were obtained to evaluate the vascular anatomy. Carotid stenosis measurements (when applicable) are obtained utilizing NASCET criteria, using the distal internal carotid diameter as the denominator. Multiphase CT imaging of the brain was performed following IV bolus contrast injection. Subsequent parametric perfusion maps  were calculated using RAPID software. CONTRAST:  4m OMNIPAQUE IOHEXOL 350 MG/ML SOLN COMPARISON:  None. FINDINGS: CT HEAD FINDINGS Brain: There is no evidence of an acute infarct, intracranial hemorrhage, mass, midline shift, or extra-axial fluid collection. Mild cerebral atrophy is within normal limits for age. Patchy hypodensities in the cerebral white matter bilaterally are nonspecific but compatible with mild chronic small vessel ischemic disease. There is a chronic lacunar infarct at the inferior aspect of the right caudate head. Vascular: Calcified atherosclerosis at the skull base. No hyperdense vessel. Skull: No fracture or suspicious osseous lesion. Sinuses/Orbits: Mild scattered mucosal thickening in the paranasal sinuses. Clear mastoid air cells. Bilateral cataract extraction. Other: None. ASPECTS ([place] Early [place] Score) - Ganglionic level infarction (caudate, lentiform nuclei, internal capsule, insula, M1-M3 cortex): 7 - Supraganglionic infarction (M4-M6 cortex): 3 Total score (0-10 with 10 being normal): 10 Review of the MIP images confirms the above findings CTA NECK FINDINGS Aortic arch: Normal variant aortic arch branching pattern with common origin of the brachiocephalic and left common carotid arteries. Mild calcified plaque in the aortic arch without evidence of a significant arch vessel origin stenosis. Right carotid system: Patent with mild scattered predominantly calcified plaque throughout the common carotid artery and at the carotid bifurcation. No evidence of a significant stenosis or dissection. Left carotid system: Patent with mild plaque in the mid common carotid artery not resulting in significant stenosis. More extensive, heavily calcified plaque at the carotid bifurcation results in less than 50% proximal ICA stenosis. No evidence of dissection. Vertebral arteries: There is either severe stenosis or short segment occlusion of the proximal left vertebral artery at its  origin with the vessel being patent throughout the remainder of the neck. There is a diffusely thready appearance of the right vertebral artery V1 and V2 segments with the right V3 segment being occluded. Skeleton: Advanced cervical disc and facet degeneration. Other neck: No evidence of cervical lymphadenopathy or mass. Upper chest: Partially visualized small right pleural effusion. No apical lung consolidation. Review of the MIP images confirms the above findings CTA HEAD FINDINGS Anterior circulation: The internal carotid arteries are patent from skull base to carotid termini with calcified plaque resulting in mild cavernous and proximal supraclinoid stenosis bilaterally. The right M1 segment is patent with a mild stenosis proximally. There is a decreased number of distal right MCA branch vessels compared to the left, however a discrete proximal branch occlusion is not identified. The left MCA is patent with branch vessel irregularity as well as a mild-to-moderate proximal M2 stenosis. Both ACAs are patent without evidence of a significant proximal stenosis. No aneurysm is identified. Posterior circulation: The intracranial left vertebral artery is patent but diffusely irregular and narrowed with multiple moderate to severe stenoses. The right V4 segment is occluded proximally with likely retrograde filling distally  supplying the right PICA. 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 Patent SCAs are seen bilaterally. There is a fetal origin of the PCAs without evidence of a significant proximal PCA stenosis. No aneurysm is identified. Venous sinuses: Patent. Anatomic variants: Fetal origin of the PCAs. Review of the MIP images confirms the above findings CT Brain Perfusion Findings: ASPECTS: 10 CBF (<30%) Volume: 0 mL Perfusion (Tmax>6.0s) volume: 55 mL Mismatch Volume: 55 mL Infarction Location:  No core infarct evident by CTP. Ischemic penumbra in the right MCA territory, greatest in the frontal lobe at and superior to the operculum though with parietal and temporal lobe involvement as well. IMPRESSION: CT HEAD: 1. No evidence of acute intracranial abnormality.  ASPECTS of 10. 2. Mild chronic small vessel ischemic disease. CTA HEAD AND NECK: 1. Decreased number of right MCA branch vessels without a discrete proximal occlusion identified. It is possible that this reflects a stump occlusion of a proximal to mid M2 branch which is not readily apparent by CTA. 2. Intracranial atherosclerosis with multiple anterior and posterior circulation stenoses as detailed above. 3. 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 4. Severe stenosis or short segment occlusion of the left vertebral artery at its origin with patency of the vessel more distally. Heavily diseased left V4 segment. 5. Cervical carotid atherosclerosis without significant stenosis. 6.  Aortic Atherosclerosis (ICD10-I70.0). CT BRAIN PERFUSION: 55 mL of ischemic penumbra in the right MCA territory without evidence of a core infarct. These results were called by telephone at the time of interpretation on 04/26/2020 at 2:15 pm to Dr. Donnetta Simpers, who verbally acknowledged these results. Electronically Signed   By: Logan Bores M.D.   On: 04/24/2020 14:58   DG CHEST PORT 1 VIEW  Result Date: 04/07/2020 CLINICAL DATA:  Endotracheal placement EXAM: PORTABLE CHEST 1 VIEW COMPARISON:  Earlier same day FINDINGS: Endotracheal tube tip 4 cm above the carina. There is worsened atelectasis in the perihilar regions both sides. Possible pulmonary venous hypertension without Pruitt edema. No visible effusion. IMPRESSION: Endotracheal tube tip 4 cm above the carina. Worsened perihilar atelectasis. Possible pulmonary venous hypertension. No Nikki edema. No visible effusion or pneumothorax. Electronically Signed   By: Nelson Chimes M.D.   On: 04/16/2020 19:29    DG Abd Portable 1V  Result Date: 04/26/2020 CLINICAL DATA:  OG tube placement. EXAM: PORTABLE ABDOMEN - 1 VIEW COMPARISON:  None. FINDINGS: An enteric tube is identified with tip overlying the proximal-mid stomach. IMPRESSION: Enteric tube with tip overlying the proximal-mid stomach. Electronically Signed   By: Margarette Canada M.D.   On: 04/26/2020 09:28   CT ANGIO HEAD CODE STROKE  Result Date: 04/16/2020 CLINICAL DATA:  Slurred speech and left-sided weakness. EXAM: CT ANGIOGRAPHY HEAD AND NECK CT PERFUSION BRAIN TECHNIQUE: Multidetector CT imaging of the head and neck was performed using the standard protocol during bolus administration of intravenous contrast. Multiplanar CT image reconstructions and MIPs were obtained to evaluate the vascular anatomy. Carotid stenosis measurements (when applicable) are obtained utilizing NASCET criteria, using the distal internal carotid diameter as the denominator. Multiphase CT imaging of the brain was performed following IV bolus contrast injection. Subsequent parametric perfusion maps were calculated using RAPID software. CONTRAST:  52m OMNIPAQUE IOHEXOL 350 MG/ML SOLN COMPARISON:  None. FINDINGS: CT HEAD FINDINGS Brain: There is no evidence of an acute infarct, intracranial hemorrhage, mass, midline shift, or extra-axial fluid collection. Mild cerebral atrophy is  within normal limits for age. Patchy hypodensities in the cerebral white matter bilaterally are nonspecific but compatible with mild chronic small vessel ischemic disease. There is a chronic lacunar infarct at the inferior aspect of the right caudate head. Vascular: Calcified atherosclerosis at the skull base. No hyperdense vessel. Skull: No fracture or suspicious osseous lesion. Sinuses/Orbits: Mild scattered mucosal thickening in the paranasal sinuses. Clear mastoid air cells. Bilateral cataract extraction. Other: None. ASPECTS ([place] Stroke Program Early CT Score) - Ganglionic level infarction (caudate,  lentiform nuclei, internal capsule, insula, M1-M3 cortex): 7 - Supraganglionic infarction (M4-M6 cortex): 3 Total score (0-10 with 10 being normal): 10 Review of the MIP images confirms the above findings CTA NECK FINDINGS Aortic arch: Normal variant aortic arch branching pattern with common origin of the brachiocephalic and left common carotid arteries. Mild calcified plaque in the aortic arch without evidence of a significant arch vessel origin stenosis. Right carotid system: Patent with mild scattered predominantly calcified plaque throughout the common carotid artery and at the carotid bifurcation. No evidence of a significant stenosis or dissection. Left carotid system: Patent with mild plaque in the mid common carotid artery not resulting in significant stenosis. More extensive, heavily calcified plaque at the carotid bifurcation results in less than 50% proximal ICA stenosis. No evidence of dissection. Vertebral arteries: There is either severe stenosis or short segment occlusion of the proximal left vertebral artery at its origin with the vessel being patent throughout the remainder of the neck. There is a diffusely thready appearance of the right vertebral artery V1 and V2 segments with the right V3 segment being occluded. Skeleton: Advanced cervical disc and facet degeneration. Other neck: No evidence of cervical lymphadenopathy or mass. Upper chest: Partially visualized small right pleural effusion. No apical lung consolidation. Review of the MIP images confirms the above findings CTA HEAD FINDINGS Anterior circulation: The internal carotid arteries are patent from skull base to carotid termini with calcified plaque resulting in mild cavernous and proximal supraclinoid stenosis bilaterally. The right M1 segment is patent with a mild stenosis proximally. There is a decreased number of distal right MCA branch vessels compared to the left, however a discrete proximal branch occlusion is not identified. The  left MCA is patent with branch vessel irregularity as well as a mild-to-moderate proximal M2 stenosis. Both ACAs are patent without evidence of a significant proximal stenosis. No aneurysm is identified. Posterior circulation: The intracranial left vertebral artery is patent but diffusely irregular and narrowed with multiple moderate to severe stenoses. The right V4 segment is occluded proximally with likely retrograde filling distally supplying the right PICA. 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 Patent SCAs are seen bilaterally. There is a fetal origin of the PCAs without evidence of a significant proximal PCA stenosis. No aneurysm is identified. Venous sinuses: Patent. Anatomic variants: Fetal origin of the PCAs. Review of the MIP images confirms the above findings CT Brain Perfusion Findings: ASPECTS: 10 CBF (<30%) Volume: 0 mL Perfusion (Tmax>6.0s) volume: 55 mL Mismatch Volume: 55 mL Infarction Location: No core infarct evident by CTP. Ischemic penumbra in the right MCA territory, greatest in the frontal lobe at and superior to the operculum though with parietal and temporal lobe involvement as well. IMPRESSION: CT HEAD: 1. No evidence of acute intracranial abnormality.  ASPECTS of 10. 2. Mild chronic small vessel ischemic disease. CTA HEAD AND NECK: 1. Decreased number of right MCA branch vessels  without a discrete proximal occlusion identified. It is possible that this reflects a stump occlusion of a proximal to mid M2 branch which is not readily apparent by CTA. 2. Intracranial atherosclerosis with multiple anterior and posterior circulation stenoses as detailed above. 3. 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 4. Severe stenosis or short segment occlusion of the left vertebral artery at its origin with patency of the vessel more distally. Heavily diseased left V4 segment. 5.  Cervical carotid atherosclerosis without significant stenosis. 6.  Aortic Atherosclerosis (ICD10-I70.0). CT BRAIN PERFUSION: 55 mL of ischemic penumbra in the right MCA territory without evidence of a core infarct. These results were called by telephone at the time of interpretation on 04/22/2020 at 2:15 pm to Dr. Donnetta Simpers, who verbally acknowledged these results. Electronically Signed   By: Logan Bores M.D.   On: 04/19/2020 14:58   CT ANGIO NECK CODE STROKE  Result Date: 04/17/2020 CLINICAL DATA:  Slurred speech and left-sided weakness. EXAM: CT ANGIOGRAPHY HEAD AND NECK CT PERFUSION BRAIN TECHNIQUE: Multidetector CT imaging of the head and neck was performed using the standard protocol during bolus administration of intravenous contrast. Multiplanar CT image reconstructions and MIPs were obtained to evaluate the vascular anatomy. Carotid stenosis measurements (when applicable) are obtained utilizing NASCET criteria, using the distal internal carotid diameter as the denominator. Multiphase CT imaging of the brain was performed following IV bolus contrast injection. Subsequent parametric perfusion maps were calculated using RAPID software. CONTRAST:  66m OMNIPAQUE IOHEXOL 350 MG/ML SOLN COMPARISON:  None. FINDINGS: CT HEAD FINDINGS Brain: There is no evidence of an acute infarct, intracranial hemorrhage, mass, midline shift, or extra-axial fluid collection. Mild cerebral atrophy is within normal limits for age. Patchy hypodensities in the cerebral white matter bilaterally are nonspecific but compatible with mild chronic small vessel ischemic disease. There is a chronic lacunar infarct at the inferior aspect of the right caudate head. Vascular: Calcified atherosclerosis at the skull base. No hyperdense vessel. Skull: No fracture or suspicious osseous lesion. Sinuses/Orbits: Mild scattered mucosal thickening in the paranasal sinuses. Clear mastoid air cells. Bilateral cataract extraction. Other: None.  ASPECTS ([place] Score) - Ganglionic level infarction (caudate, lentiform nuclei, internal capsule, insula, M1-M3 cortex): 7 - Supraganglionic infarction (M4-M6 cortex): 3 Total score (0-10 with 10 being normal): 10 Review of the MIP images confirms the above findings CTA NECK FINDINGS Aortic arch: Normal variant aortic arch branching pattern with common origin of the brachiocephalic and left common carotid arteries. Mild calcified plaque in the aortic arch without evidence of a significant arch vessel origin stenosis. Right carotid system: Patent with mild scattered predominantly calcified plaque throughout the common carotid artery and at the carotid bifurcation. No evidence of a significant stenosis or dissection. Left carotid system: Patent with mild plaque in the mid common carotid artery not resulting in significant stenosis. More extensive, heavily calcified plaque at the carotid bifurcation results in less than 50% proximal ICA stenosis. No evidence of dissection. Vertebral arteries: There is either severe stenosis or short segment occlusion of the proximal left vertebral artery at its origin with the vessel being patent throughout the remainder of the neck. There is a diffusely thready appearance of the right vertebral artery V1 and V2 segments with the right V3 segment being occluded. Skeleton: Advanced cervical disc and facet degeneration. Other neck: No evidence of cervical lymphadenopathy or mass. Upper chest: Partially visualized small right pleural effusion. No apical lung consolidation. Review of the MIP images confirms the above  findings CTA HEAD FINDINGS Anterior circulation: The internal carotid arteries are patent from skull base to carotid termini with calcified plaque resulting in mild cavernous and proximal supraclinoid stenosis bilaterally. The right M1 segment is patent with a mild stenosis proximally. There is a decreased number of distal right MCA branch vessels  compared to the left, however a discrete proximal branch occlusion is not identified. The left MCA is patent with branch vessel irregularity as well as a mild-to-moderate proximal M2 stenosis. Both ACAs are patent without evidence of a significant proximal stenosis. No aneurysm is identified. Posterior circulation: The intracranial left vertebral artery is patent but diffusely irregular and narrowed with multiple moderate to severe stenoses. The right V4 segment is occluded proximally with likely retrograde filling distally supplying the right PICA. 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 Patent SCAs are seen bilaterally. There is a fetal origin of the PCAs without evidence of a significant proximal PCA stenosis. No aneurysm is identified. Venous sinuses: Patent. Anatomic variants: Fetal origin of the PCAs. Review of the MIP images confirms the above findings CT Brain Perfusion Findings: ASPECTS: 10 CBF (<30%) Volume: 0 mL Perfusion (Tmax>6.0s) volume: 55 mL Mismatch Volume: 55 mL Infarction Location: No core infarct evident by CTP. Ischemic penumbra in the right MCA territory, greatest in the frontal lobe at and superior to the operculum though with parietal and temporal lobe involvement as well. IMPRESSION: CT HEAD: 1. No evidence of acute intracranial abnormality.  ASPECTS of 10. 2. Mild chronic small vessel ischemic disease. CTA HEAD AND NECK: 1. Decreased number of right MCA branch vessels without a discrete proximal occlusion identified. It is possible that this reflects a stump occlusion of a proximal to mid M2 branch which is not readily apparent by CTA. 2. Intracranial atherosclerosis with multiple anterior and posterior circulation stenoses as detailed above. 3. 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 4. Severe stenosis or short segment occlusion of the left vertebral artery at  its origin with patency of the vessel more distally. Heavily diseased left V4 segment. 5. Cervical carotid atherosclerosis without significant stenosis. 6.  Aortic Atherosclerosis (ICD10-I70.0). CT BRAIN PERFUSION: 55 mL of ischemic penumbra in the right MCA territory without evidence of a core infarct. These results were called by telephone at the time of interpretation on 04/11/2020 at 2:15 pm to Dr. Donnetta Simpers, who verbally acknowledged these results. Electronically Signed   By: Logan Bores M.D.   On: 04/27/2020 14:58    PHYSICAL EXAM  Temp:  [93 F (33.9 C)-98.96 F (37.2 C)] 97.7 F (36.5 C) (02/20 1115) Pulse Rate:  [82-112] 91 (02/20 1122) Resp:  [10-33] 26 (02/20 1122) BP: (98-178)/(38-89) 143/55 (02/20 1122) SpO2:  [87 %-100 %] 99 % (02/20 1115) Arterial Line BP: (102-172)/(37-67) 121/50 (02/20 1115) FiO2 (%):  [40 %-100 %] 40 % (02/20 1122) Weight:  IW:[id number] kg] 113 kg (02/19 2200)  General - Well nourished, well developed, intubated on sedation.  Ophthalmologic - fundi not visualized due to noncooperation.  Cardiovascular - irregularly irregular heart rate and rhythm.  Neuro - intubated on sedation, eyes closed, not following commands. With forced eye opening, eyes in mid position, not blinking to visual threat, doll's eyes absent, not tracking. Corneal reflex absent, gag and cough weak. Breathing barely over the vent.  Facial symmetry not able to test due to ET tube.  Tongue protrusion not cooperative. On pain stimulation, no movement in  all extremities. DTR 1+ and right positive babinski. Sensation, coordination and gait not tested.   ASSESSMENT/PLAN Robert Daniel is a 80 y.o. male with history of PMHx of DM II, HTN, AF s/p cardioversion in 11/2019 on Xarelto, and HLD transfer from [facility] for significant left arm weakness, slurred speech and left facial droop. Time onset 1200pm. NIHSS = 4. CT no acute finding. CTA head and neck left ICA bulb and b/l ICA siphon severe  athero with stenosis. Possible right M2 branch occlusion. CTP right MCA penumbra. Last dose Xarelto last night. Pt not tPA candidate given on Xarelto. He was transferred to [facility] for EVT.     Stroke:  right MCA scattered infarcts and two left ACA punctate infarcts due to left M2 occlusion s/p stenting, more likely large vessel stenosis given plaque occluding left M2. However, also possible due to AF despite on Xarelto  CT no acute finding.   CTA head and neck left ICA bulb and b/l ICA siphon severe athero with stenosis. Questionable right M2 branch occlusion.   CTP right MCA penumbra.  IR - right M2 branch occlusion with plaque - s/p stenting  MRI  Scattered acute cortical infarcts in the right MCA territory and right occipital lobe. Two tiny cortical infarcts in the left cerebral hemisphere.  MRA  Present flow in the stented right M2 branch.  2D Echo  pending  LDL pending  HgbA1c 9.8  Heparin subq for VTE prophylaxis  Xarelto (rivaroxaban) daily prior to admission, now on aspirin 81 mg daily and Brilinta (ticagrelor) 90 mg bid due to stenting  Ongoing aggressive stroke risk factor management  Therapy recommendations:  pending  Disposition:  pending  Respiratory failure  Intubated for procedure  Not able to extubate after procedure  CCM on board  Wean off vent as able  Cardiac arrest  CHF  Cardiac arrest for 20-30s and ROSC - no CPR needed  Concerning for CHF   CXR showed no Izyk edema  TTE pending  Tele monitoring  Chronic AF   s/p cardioversion in 11/2019   on Xarelto  Rate controlled   Need to resume AC once appropriate  Intracranial stenosis  CTA head and neck left ICA bulb and b/l ICA siphon severe athero with stenosis.  Avoid low BP  Long term BP goal 130-150  Diabetes  HgbA1c 9.8 goal < 7.0  Uncontrolled  CBG monitoring  SSI  Hypoglycemia this am - D50 and now on D10  Consider tube feeding if not able to wean off vent  DM  education and close PCP follow up  Hypertension . Fluctuate according to sedation status . BP goal 120-140 for 24h post IR  Long term BP goal 130-150 given intracranial stenosis  Hyperlipidemia  Home meds:  lipitor 80   LDL pending, goal < 70  Resume statin once  Po access  Continue statin at discharge  AKI ? Vs. CKD ?  Cre 3.00->3.35->3.71  Hyperkalemia - improved  CCM on board  Could be related to iodine contrast  On IVF  Other Stroke Risk Factors  Advanced age  Obesity, Body mass index is 32.87 kg/m.   Other Active Problems  Hyperkalemia - 7.2->5.5->5.0  Leukocytosis - WBC 16.1->12.2  Hospital day # 1  This patient is critically ill due to left MCA stroke s/p IR, AKI, hyperkalemia, respiratory failure, cardiac arrest, AF and at significant risk of neurological worsening, death form recurrent stroke, hemorrhagic conversion, seizure, renal and heart failure, cardiac arrest. This patient's care  requires constant monitoring of vital signs, hemodynamics, respiratory and cardiac monitoring, review of multiple databases, neurological assessment, discussion with family, other specialists and medical decision making of high complexity. I spent 45 minutes of neurocritical care time in the care of this patient. I have discussed with Dr. Phillips Hay, MD PhD Stroke Neurology 04/26/2020 11:39 AM    To contact Stroke Continuity provider, please refer to [email or web address] After hours, contact General Neurology

## 2020-04-26 NOTE — Progress Notes (Signed)
Brief Nutrition Note RD working remotely.  Consult received for enteral/tube feeding initiation and management. OGT in place; placed on 04/26/20.  Adult Enteral Nutrition Protocol initiated. Full assessment to follow.  Admitting Dx: Stroke ([place]) [I63.9] Stroke (cerebrum) ([place]) [I63.9]  Body mass index is 32.87 kg/m. Pt meets criteria for obesity based on current BMI.  Labs:  Recent Labs  Lab 05/03/2020 1417 04/20/2020 1609 04/30/2020 2010 04/26/2020 2043 04/26/20 0423  NA 137   < > 142 141 141  K 4.8   < > 5.7* 5.5* 4.9  CL 104  --  109  --  109  CO2 18*  --  19*  --  17*  BUN 84*  --  81*  --  85*  CREATININE 3.00*  --  3.17*  --  3.35*  CALCIUM 8.5*  --  9.9  --  8.5*  GLUCOSE 76  --  89  --  42*   < > = values in this interval not displayed.       Jarome Matin, MS, RD, LDN, CNSC Inpatient Clinical Dietitian RD pager # available in [place]  After hours/weekend pager # available in [facility]

## 2020-04-26 NOTE — Progress Notes (Signed)
Referring Physician(s): Code Stroke  Supervising Physician: Pedro Earls  Patient Status:  [facility] - In-pt  Chief Complaint: Acute CVA; occlusion of right middle cerebral artery s/p mechanical thrombectomy and intracranial stenting.  HPI: 80 year old male with a history of HTN, HDL and A. Fib on Xarelto presented to the [facility] ED with slurred speech and a fall. Imaging was concerning for a large infarct and he was transferred to [facility]. He was taken to Neuro IR for a diagnostic cerebral angiogram and a proximal right M2/MCA occlusion was identified. He underwent mechanical thrombectomy with recanalization and subsequent reocclusion x3. He was loaded with cangrelor and an intracranial stent was deployed. He was unstable throughout the procedure with hypotension and brief asystole prior to stent deployment; spontaneous ROSC.   Subjective: Patient remains intubated and sedated. Right groin vascular access site is clean and dry.   Allergies: Patient has no known allergies.  Medications: Prior to Admission medications   Medication Sig Start Date End Date Taking? Authorizing Provider  rivaroxaban (XARELTO) 20 MG TABS tablet Take 1 tablet (20 mg total) by mouth daily with supper. 03/26/20  Yes Minna Merritts, MD  acetaminophen (TYLENOL) 500 MG tablet Take 500 mg by mouth every 6 (six) hours as needed.    [provider]  Ascorbic Acid (VITAMIN C) 1000 MG tablet Take 1,000 mg by mouth daily.    [provider]  aspirin EC 81 MG tablet Take 81 mg by mouth daily.    [provider]  atorvastatin (LIPITOR) 80 MG tablet Take 1 tablet (80 mg total) by mouth daily. 12/19/19 03/18/20  Marrianne Mood D, PA-C  benazepril (LOTENSIN) 40 MG tablet Take 1 tablet by mouth once daily 11/25/19   Loel Dubonnet, NP  Cinnamon 500 MG TABS Take 1,000 mg by mouth daily.    [provider]  cloNIDine (CATAPRES) 0.2 MG tablet Take 1 tablet by mouth 4 times daily  01/28/20   Parks Ranger, Devonne Doughty, DO  co-enzyme Q-10 30 MG capsule Take 30 mg by mouth 3 (three) times daily.    [provider]  felodipine (PLENDIL) 10 MG 24 hr tablet Take 1 tablet (10 mg total) by mouth daily. 03/12/19   Minna Merritts, MD  gemfibrozil (LOPID) 600 MG tablet TAKE 1 TABLET BY MOUTH TWICE DAILY BEFORE A MEAL 07/30/19   Karamalegos, Devonne Doughty, DO  glimepiride (AMARYL) 4 MG tablet Take 1 tablet (4 mg total) by mouth daily with breakfast. 03/26/20   Karamalegos, Devonne Doughty, DO  hydrALAZINE (APRESOLINE) 100 MG tablet TAKE 1 TABLET BY MOUTH IN THE MORNING, AT NOON, IN THE EVENING, AND AT BEDTIME 02/25/20   Loel Dubonnet, NP  ibuprofen (ADVIL,MOTRIN) 200 MG tablet Take 800 mg by mouth every 6 (six) hours as needed.    [provider]  isosorbide mononitrate (IMDUR) 60 MG 24 hr tablet Take 1 tablet by mouth twice daily 02/24/20   Loel Dubonnet, NP  labetalol (NORMODYNE) 100 MG tablet Take 1 tablet (100 mg total) by mouth 2 (two) times daily. Take 1 tablet (100 mg total) by mouth 2 (two) times daily. May take an additional half tablet as needed for systolic blood pressure [id number]. 05/09/19   Loel Dubonnet, NP  metFORMIN (GLUCOPHAGE) 1000 MG tablet TAKE 1 TABLET BY MOUTH TWICE DAILY WITH A MEAL 01/28/20   Karamalegos, Devonne Doughty, DO  Multiple Vitamin (MULTIVITAMIN) tablet Take 1 tablet by mouth daily.    [provider]  nitroGLYCERIN (NITROSTAT) 0.4 MG SL tablet DISSOLVE ONE TABLET UNDER THE TONGUE EVERY 5 MINUTES AS NEEDED FOR CHEST PAIN.  DO NOT EXCEED A TOTAL OF 3 DOSES IN 15 MINUTES 12/31/19   Gollan, Kathlene November, MD  Omega-3 Fatty Acids (OMEGA-3 FISH OIL) 1200 MG CAPS Take 2,000 mg by mouth 2 (two) times daily with a meal.    [provider]  torsemide (DEMADEX) 20 MG tablet Take 1 tablet (20 mg total) by mouth every other day. 10/10/19 01/08/20  Marrianne Mood D, PA-C  Turmeric 500 MG TABS Take by mouth daily.     [provider]      Vital Signs: BP 123/71   Pulse 89   Temp (!) 97.34 F (36.3 C)   Resp (!) 23   Ht '6\' 1"'$  (1.854 m)   Wt 249 lb 1.9 oz (113 kg)   SpO2 98%   BMI 32.87 kg/m   Physical Exam Constitutional:      Comments: Intubated/sedated. No response to voice or light touch.   Eyes:     Comments: Pupils 2 mm, equal and round; sluggish response.   Cardiovascular:     Rate and Rhythm: Normal rate and regular rhythm.     Heart sounds: Normal heart sounds.     Comments: Right femoral access site is soft, clean and dry. Unable to palpate pedal pulses. Extremities warm and dry.  Pulmonary:     Breath sounds: Normal breath sounds.  Abdominal:     General: Abdomen is protuberant.  Skin:    General: Skin is warm and dry.     Imaging: DG Chest 1 View  Result Date: 04/28/2020 CLINICAL DATA:  Acute shortness of breath.  CVA. EXAM: CHEST  1 VIEW COMPARISON:  07/08/2019 chest radiograph FINDINGS: This is a low volume study. The cardiomediastinal silhouette is unchanged. Mild pulmonary vascular congestion noted. There is no evidence of focal airspace disease, pulmonary edema, suspicious pulmonary nodule/mass, pleural effusion, or pneumothorax. No acute bony abnormalities are identified. IMPRESSION: Mild pulmonary vascular congestion. Electronically Signed   By: Margarette Canada M.D.   On: 04/28/2020 15:02   MR ANGIO HEAD WO CONTRAST  Result Date: 04/26/2020 CLINICAL DATA:  Stroke follow-up. EXAM: MRI HEAD WITHOUT CONTRAST MRA HEAD WITHOUT CONTRAST TECHNIQUE: Multiplanar, multiecho pulse sequences of the brain and surrounding structures were obtained without intravenous contrast. Angiographic images of the head were obtained using MRA technique without contrast. COMPARISON:  CT a of the head neck from yesterday FINDINGS: MRI HEAD FINDINGS Brain: Patchy cortical infarction along the right MCA territory and reaching the right occipital cortex in this patient with large right posterior communicating artery. Two  tiny cortical infarcts in the left MCA to distal ACA territory. No hemorrhage, mass effect, or hydrocephalus. Age normal brain volume. Mild pre-existing ischemic disease for age. Vascular: Arterial findings below. Skull and upper cervical spine: Normal marrow signal Sinuses/Orbits: Generalized mucosal thickening with fluid levels. MRA HEAD FINDINGS Right M2 stenting with expected associated artifact but visible internal flow on source images. On 3D reformats there is apparent high-grade stenosis with flow gap in the region of the M2 takeoff, but this is likely primarily from artifact based on source images. There is moderate atheromatous irregularity of bilateral MCA branches. Diffuse atheromatous irregularity of the left V4 segment and basilar with overall moderate stenosis except at the vertebrobasilar junction where narrowing appears high-grade. The right V4 segment is not seen until the patent PICA. IMPRESSION: 1. Scattered acute cortical infarcts in the right  MCA territory and right occipital lobe. Two tiny cortical infarcts in the left cerebral hemisphere. 2. Present flow in the stented right M2 branch. 3. Advanced intracranial atherosclerosis as previously characterized. 4. Sinus inflammation. Electronically Signed   By: Monte Fantasia M.D.   On: 04/26/2020 04:34   MR BRAIN WO CONTRAST  Result Date: 04/26/2020 CLINICAL DATA:  Stroke follow-up. EXAM: MRI HEAD WITHOUT CONTRAST MRA HEAD WITHOUT CONTRAST TECHNIQUE: Multiplanar, multiecho pulse sequences of the brain and surrounding structures were obtained without intravenous contrast. Angiographic images of the head were obtained using MRA technique without contrast. COMPARISON:  CT a of the head neck from yesterday FINDINGS: MRI HEAD FINDINGS Brain: Patchy cortical infarction along the right MCA territory and reaching the right occipital cortex in this patient with large right posterior communicating artery. Two tiny cortical infarcts in the left MCA to  distal ACA territory. No hemorrhage, mass effect, or hydrocephalus. Age normal brain volume. Mild pre-existing ischemic disease for age. Vascular: Arterial findings below. Skull and upper cervical spine: Normal marrow signal Sinuses/Orbits: Generalized mucosal thickening with fluid levels. MRA HEAD FINDINGS Right M2 stenting with expected associated artifact but visible internal flow on source images. On 3D reformats there is apparent high-grade stenosis with flow gap in the region of the M2 takeoff, but this is likely primarily from artifact based on source images. There is moderate atheromatous irregularity of bilateral MCA branches. Diffuse atheromatous irregularity of the left V4 segment and basilar with overall moderate stenosis except at the vertebrobasilar junction where narrowing appears high-grade. The right V4 segment is not seen until the patent PICA. IMPRESSION: 1. Scattered acute cortical infarcts in the right MCA territory and right occipital lobe. Two tiny cortical infarcts in the left cerebral hemisphere. 2. Present flow in the stented right M2 branch. 3. Advanced intracranial atherosclerosis as previously characterized. 4. Sinus inflammation. Electronically Signed   By: Monte Fantasia M.D.   On: 04/26/2020 04:34   CT CEREBRAL PERFUSION W CONTRAST  Result Date: 04/11/2020 CLINICAL DATA:  Slurred speech and left-sided weakness. EXAM: CT ANGIOGRAPHY HEAD AND NECK CT PERFUSION BRAIN TECHNIQUE: Multidetector CT imaging of the head and neck was performed using the standard protocol during bolus administration of intravenous contrast. Multiplanar CT image reconstructions and MIPs were obtained to evaluate the vascular anatomy. Carotid stenosis measurements (when applicable) are obtained utilizing NASCET criteria, using the distal internal carotid diameter as the denominator. Multiphase CT imaging of the brain was performed following IV bolus contrast injection. Subsequent parametric perfusion maps  were calculated using RAPID software. CONTRAST:  67m OMNIPAQUE IOHEXOL 350 MG/ML SOLN COMPARISON:  None. FINDINGS: CT HEAD FINDINGS Brain: There is no evidence of an acute infarct, intracranial hemorrhage, mass, midline shift, or extra-axial fluid collection. Mild cerebral atrophy is within normal limits for age. Patchy hypodensities in the cerebral white matter bilaterally are nonspecific but compatible with mild chronic small vessel ischemic disease. There is a chronic lacunar infarct at the inferior aspect of the right caudate head. Vascular: Calcified atherosclerosis at the skull base. No hyperdense vessel. Skull: No fracture or suspicious osseous lesion. Sinuses/Orbits: Mild scattered mucosal thickening in the paranasal sinuses. Clear mastoid air cells. Bilateral cataract extraction. Other: None. ASPECTS ([place]roke Program Early CT Score) - Ganglionic level infarction (caudate, lentiform nuclei, internal capsule, insula, M1-M3 cortex): 7 - Supraganglionic infarction (M4-M6 cortex): 3 Total score (0-10 with 10 being normal): 10 Review of the MIP images confirms the above findings CTA NECK FINDINGS Aortic arch: Normal variant aortic arch branching  pattern with common origin of the brachiocephalic and left common carotid arteries. Mild calcified plaque in the aortic arch without evidence of a significant arch vessel origin stenosis. Right carotid system: Patent with mild scattered predominantly calcified plaque throughout the common carotid artery and at the carotid bifurcation. No evidence of a significant stenosis or dissection. Left carotid system: Patent with mild plaque in the mid common carotid artery not resulting in significant stenosis. More extensive, heavily calcified plaque at the carotid bifurcation results in less than 50% proximal ICA stenosis. No evidence of dissection. Vertebral arteries: There is either severe stenosis or short segment occlusion of the proximal left vertebral artery at its  origin with the vessel being patent throughout the remainder of the neck. There is a diffusely thready appearance of the right vertebral artery V1 and V2 segments with the right V3 segment being occluded. Skeleton: Advanced cervical disc and facet degeneration. Other neck: No evidence of cervical lymphadenopathy or mass. Upper chest: Partially visualized small right pleural effusion. No apical lung consolidation. Review of the MIP images confirms the above findings CTA HEAD FINDINGS Anterior circulation: The internal carotid arteries are patent from skull base to carotid termini with calcified plaque resulting in mild cavernous and proximal supraclinoid stenosis bilaterally. The right M1 segment is patent with a mild stenosis proximally. There is a decreased number of distal right MCA branch vessels compared to the left, however a discrete proximal branch occlusion is not identified. The left MCA is patent with branch vessel irregularity as well as a mild-to-moderate proximal M2 stenosis. Both ACAs are patent without evidence of a significant proximal stenosis. No aneurysm is identified. Posterior circulation: The intracranial left vertebral artery is patent but diffusely irregular and narrowed with multiple moderate to severe stenoses. The right V4 segment is occluded proximally with likely retrograde filling distally supplying the right PICA. 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 Patent SCAs are seen bilaterally. There is a fetal origin of the PCAs without evidence of a significant proximal PCA stenosis. No aneurysm is identified. Venous sinuses: Patent. Anatomic variants: Fetal origin of the PCAs. Review of the MIP images confirms the above findings CT Brain Perfusion Findings: ASPECTS: 10 CBF (<30%) Volume: 0 mL Perfusion (Tmax>6.0s) volume: 55 mL Mismatch Volume: 55 mL Infarction Location:  No core infarct evident by CTP. Ischemic penumbra in the right MCA territory, greatest in the frontal lobe at and superior to the operculum though with parietal and temporal lobe involvement as well. IMPRESSION: CT HEAD: 1. No evidence of acute intracranial abnormality.  ASPECTS of 10. 2. Mild chronic small vessel ischemic disease. CTA HEAD AND NECK: 1. Decreased number of right MCA branch vessels without a discrete proximal occlusion identified. It is possible that this reflects a stump occlusion of a proximal to mid M2 branch which is not readily apparent by CTA. 2. Intracranial atherosclerosis with multiple anterior and posterior circulation stenoses as detailed above. 3. 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 4. Severe stenosis or short segment occlusion of the left vertebral artery at its origin with patency of the vessel more distally. Heavily diseased left V4 segment. 5. Cervical carotid atherosclerosis without significant stenosis. 6.  Aortic Atherosclerosis (ICD10-I70.0). CT BRAIN PERFUSION: 55 mL of ischemic penumbra in the right MCA territory without evidence of a core infarct. These results were called by telephone at the time of interpretation on 04/20/2020 at 2:15 pm to Dr. Alferd Patee  Lorrin Goodell, who verbally acknowledged these results. Electronically Signed   By: Logan Bores M.D.   On: 04/23/2020 14:58   DG CHEST PORT 1 VIEW  Result Date: 04/09/2020 CLINICAL DATA:  Endotracheal placement EXAM: PORTABLE CHEST 1 VIEW COMPARISON:  Earlier same day FINDINGS: Endotracheal tube tip 4 cm above the carina. There is worsened atelectasis in the perihilar regions both sides. Possible pulmonary venous hypertension without Semir edema. No visible effusion. IMPRESSION: Endotracheal tube tip 4 cm above the carina. Worsened perihilar atelectasis. Possible pulmonary venous hypertension. No Morrell edema. No visible effusion or pneumothorax. Electronically Signed   By: Nelson Chimes M.D.   On: 04/28/2020 19:29    DG Abd Portable 1V  Result Date: 04/26/2020 CLINICAL DATA:  OG tube placement. EXAM: PORTABLE ABDOMEN - 1 VIEW COMPARISON:  None. FINDINGS: An enteric tube is identified with tip overlying the proximal-mid stomach. IMPRESSION: Enteric tube with tip overlying the proximal-mid stomach. Electronically Signed   By: Margarette Canada M.D.   On: 04/26/2020 09:28   CT ANGIO HEAD CODE STROKE  Result Date: 04/26/2020 CLINICAL DATA:  Slurred speech and left-sided weakness. EXAM: CT ANGIOGRAPHY HEAD AND NECK CT PERFUSION BRAIN TECHNIQUE: Multidetector CT imaging of the head and neck was performed using the standard protocol during bolus administration of intravenous contrast. Multiplanar CT image reconstructions and MIPs were obtained to evaluate the vascular anatomy. Carotid stenosis measurements (when applicable) are obtained utilizing NASCET criteria, using the distal internal carotid diameter as the denominator. Multiphase CT imaging of the brain was performed following IV bolus contrast injection. Subsequent parametric perfusion maps were calculated using RAPID software. CONTRAST:  47m OMNIPAQUE IOHEXOL 350 MG/ML SOLN COMPARISON:  None. FINDINGS: CT HEAD FINDINGS Brain: There is no evidence of an acute infarct, intracranial hemorrhage, mass, midline shift, or extra-axial fluid collection. Mild cerebral atrophy is within normal limits for age. Patchy hypodensities in the cerebral white matter bilaterally are nonspecific but compatible with mild chronic small vessel ischemic disease. There is a chronic lacunar infarct at the inferior aspect of the right caudate head. Vascular: Calcified atherosclerosis at the skull base. No hyperdense vessel. Skull: No fracture or suspicious osseous lesion. Sinuses/Orbits: Mild scattered mucosal thickening in the paranasal sinuses. Clear mastoid air cells. Bilateral cataract extraction. Other: None. ASPECTS ([place] Early [place] Score) - Ganglionic level infarction (caudate,  lentiform nuclei, internal capsule, insula, M1-M3 cortex): 7 - Supraganglionic infarction (M4-M6 cortex): 3 Total score (0-10 with 10 being normal): 10 Review of the MIP images confirms the above findings CTA NECK FINDINGS Aortic arch: Normal variant aortic arch branching pattern with common origin of the brachiocephalic and left common carotid arteries. Mild calcified plaque in the aortic arch without evidence of a significant arch vessel origin stenosis. Right carotid system: Patent with mild scattered predominantly calcified plaque throughout the common carotid artery and at the carotid bifurcation. No evidence of a significant stenosis or dissection. Left carotid system: Patent with mild plaque in the mid common carotid artery not resulting in significant stenosis. More extensive, heavily calcified plaque at the carotid bifurcation results in less than 50% proximal ICA stenosis. No evidence of dissection. Vertebral arteries: There is either severe stenosis or short segment occlusion of the proximal left vertebral artery at its origin with the vessel being patent throughout the remainder of the neck. There is a diffusely thready appearance of the right vertebral artery V1 and V2 segments with the right V3 segment being occluded. Skeleton: Advanced cervical disc and facet degeneration. Other neck: No  evidence of cervical lymphadenopathy or mass. Upper chest: Partially visualized small right pleural effusion. No apical lung consolidation. Review of the MIP images confirms the above findings CTA HEAD FINDINGS Anterior circulation: The internal carotid arteries are patent from skull base to carotid termini with calcified plaque resulting in mild cavernous and proximal supraclinoid stenosis bilaterally. The right M1 segment is patent with a mild stenosis proximally. There is a decreased number of distal right MCA branch vessels compared to the left, however a discrete proximal branch occlusion is not identified. The  left MCA is patent with branch vessel irregularity as well as a mild-to-moderate proximal M2 stenosis. Both ACAs are patent without evidence of a significant proximal stenosis. No aneurysm is identified. Posterior circulation: The intracranial left vertebral artery is patent but diffusely irregular and narrowed with multiple moderate to severe stenoses. The right V4 segment is occluded proximally with likely retrograde filling distally supplying the right PICA. 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 Patent SCAs are seen bilaterally. There is a fetal origin of the PCAs without evidence of a significant proximal PCA stenosis. No aneurysm is identified. Venous sinuses: Patent. Anatomic variants: Fetal origin of the PCAs. Review of the MIP images confirms the above findings CT Brain Perfusion Findings: ASPECTS: 10 CBF (<30%) Volume: 0 mL Perfusion (Tmax>6.0s) volume: 55 mL Mismatch Volume: 55 mL Infarction Location: No core infarct evident by CTP. Ischemic penumbra in the right MCA territory, greatest in the frontal lobe at and superior to the operculum though with parietal and temporal lobe involvement as well. IMPRESSION: CT HEAD: 1. No evidence of acute intracranial abnormality.  ASPECTS of 10. 2. Mild chronic small vessel ischemic disease. CTA HEAD AND NECK: 1. Decreased number of right MCA branch vessels without a discrete proximal occlusion identified. It is possible that this reflects a stump occlusion of a proximal to mid M2 branch which is not readily apparent by CTA. 2. Intracranial atherosclerosis with multiple anterior and posterior circulation stenoses as detailed above. 3. 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 4. Severe stenosis or short segment occlusion of the left vertebral artery at its origin with patency of the vessel more distally. Heavily diseased left V4 segment. 5.  Cervical carotid atherosclerosis without significant stenosis. 6.  Aortic Atherosclerosis (ICD10-I70.0). CT BRAIN PERFUSION: 55 mL of ischemic penumbra in the right MCA territory without evidence of a core infarct. These results were called by telephone at the time of interpretation on 04/07/2020 at 2:15 pm to Dr. Donnetta Simpers, who verbally acknowledged these results. Electronically Signed   By: Logan Bores M.D.   On: 04/23/2020 14:58   CT ANGIO NECK CODE STROKE  Result Date: 05/01/2020 CLINICAL DATA:  Slurred speech and left-sided weakness. EXAM: CT ANGIOGRAPHY HEAD AND NECK CT PERFUSION BRAIN TECHNIQUE: Multidetector CT imaging of the head and neck was performed using the standard protocol during bolus administration of intravenous contrast. Multiplanar CT image reconstructions and MIPs were obtained to evaluate the vascular anatomy. Carotid stenosis measurements (when applicable) are obtained utilizing NASCET criteria, using the distal internal carotid diameter as the denominator. Multiphase CT imaging of the brain was performed following IV bolus contrast injection. Subsequent parametric perfusion maps were calculated using RAPID software. CONTRAST:  77m OMNIPAQUE IOHEXOL 350 MG/ML SOLN COMPARISON:  None. FINDINGS: CT HEAD FINDINGS Brain: There is no evidence of an acute infarct, intracranial hemorrhage, mass, midline shift, or extra-axial fluid collection. Mild cerebral  atrophy is within normal limits for age. Patchy hypodensities in the cerebral white matter bilaterally are nonspecific but compatible with mild chronic small vessel ischemic disease. There is a chronic lacunar infarct at the inferior aspect of the right caudate head. Vascular: Calcified atherosclerosis at the skull base. No hyperdense vessel. Skull: No fracture or suspicious osseous lesion. Sinuses/Orbits: Mild scattered mucosal thickening in the paranasal sinuses. Clear mastoid air cells. Bilateral cataract extraction. Other: None.  ASPECTS ([place] Stroke Program Early CT Score) - Ganglionic level infarction (caudate, lentiform nuclei, internal capsule, insula, M1-M3 cortex): 7 - Supraganglionic infarction (M4-M6 cortex): 3 Total score (0-10 with 10 being normal): 10 Review of the MIP images confirms the above findings CTA NECK FINDINGS Aortic arch: Normal variant aortic arch branching pattern with common origin of the brachiocephalic and left common carotid arteries. Mild calcified plaque in the aortic arch without evidence of a significant arch vessel origin stenosis. Right carotid system: Patent with mild scattered predominantly calcified plaque throughout the common carotid artery and at the carotid bifurcation. No evidence of a significant stenosis or dissection. Left carotid system: Patent with mild plaque in the mid common carotid artery not resulting in significant stenosis. More extensive, heavily calcified plaque at the carotid bifurcation results in less than 50% proximal ICA stenosis. No evidence of dissection. Vertebral arteries: There is either severe stenosis or short segment occlusion of the proximal left vertebral artery at its origin with the vessel being patent throughout the remainder of the neck. There is a diffusely thready appearance of the right vertebral artery V1 and V2 segments with the right V3 segment being occluded. Skeleton: Advanced cervical disc and facet degeneration. Other neck: No evidence of cervical lymphadenopathy or mass. Upper chest: Partially visualized small right pleural effusion. No apical lung consolidation. Review of the MIP images confirms the above findings CTA HEAD FINDINGS Anterior circulation: The internal carotid arteries are patent from skull base to carotid termini with calcified plaque resulting in mild cavernous and proximal supraclinoid stenosis bilaterally. The right M1 segment is patent with a mild stenosis proximally. There is a decreased number of distal right MCA branch vessels  compared to the left, however a discrete proximal branch occlusion is not identified. The left MCA is patent with branch vessel irregularity as well as a mild-to-moderate proximal M2 stenosis. Both ACAs are patent without evidence of a significant proximal stenosis. No aneurysm is identified. Posterior circulation: The intracranial left vertebral artery is patent but diffusely irregular and narrowed with multiple moderate to severe stenoses. The right V4 segment is occluded proximally with likely retrograde filling distally supplying the right PICA. 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 Patent SCAs are seen bilaterally. There is a fetal origin of the PCAs without evidence of a significant proximal PCA stenosis. No aneurysm is identified. Venous sinuses: Patent. Anatomic variants: Fetal origin of the PCAs. Review of the MIP images confirms the above findings CT Brain Perfusion Findings: ASPECTS: 10 CBF (<30%) Volume: 0 mL Perfusion (Tmax>6.0s) volume: 55 mL Mismatch Volume: 55 mL Infarction Location: No core infarct evident by CTP. Ischemic penumbra in the right MCA territory, greatest in the frontal lobe at and superior to the operculum though with parietal and temporal lobe involvement as well. IMPRESSION: CT HEAD: 1. No evidence of acute intracranial abnormality.  ASPECTS of 10. 2. Mild chronic small vessel ischemic disease. CTA HEAD AND NECK: 1. Decreased number of right MCA  branch vessels without a discrete proximal occlusion identified. It is possible that this reflects a stump occlusion of a proximal to mid M2 branch which is not readily apparent by CTA. 2. Intracranial atherosclerosis with multiple anterior and posterior circulation stenoses as detailed above. 3. 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 4. Severe stenosis or short segment occlusion of the left vertebral artery at  its origin with patency of the vessel more distally. Heavily diseased left V4 segment. 5. Cervical carotid atherosclerosis without significant stenosis. 6.  Aortic Atherosclerosis (ICD10-I70.0). CT BRAIN PERFUSION: 55 mL of ischemic penumbra in the right MCA territory without evidence of a core infarct. These results were called by telephone at the time of interpretation on 04/14/2020 at 2:15 pm to Dr. Donnetta Simpers, who verbally acknowledged these results. Electronically Signed   By: Logan Bores M.D.   On: 04/11/2020 14:58    Labs:  CBC: Recent Labs    10/28/19 1017 04/07/2020 1417 04/11/2020 1609 04/29/2020 1809 04/19/2020 2010 04/23/2020 2043 04/26/20 0423  WBC 8.2 16.8*  --   --  16.1*  --  12.2*  HGB 11.0* 8.1*   < > 6.8* 7.9* 7.5* 8.6*  HCT 35.2* 25.0*   < > 20.0* 25.3* 22.0* 27.5*  PLT 195 251  --   --  226  --  264   < > = values in this interval not displayed.    COAGS: Recent Labs    04/24/2020 1417 04/26/20 0206  INR 2.2* 2.4*  APTT 55*  --     BMP: Recent Labs    10/09/19 1029 10/15/19 1159 10/28/19 1017 12/04/19 1147 04/27/2020 1417 04/11/2020 1609 04/14/2020 1809 04/19/2020 2010 04/16/2020 2043 04/26/20 0423  NA 141 146* 142 139 137   < > 137 142 141 141  K 4.6 5.1 4.3 3.9 4.8   < > 7.2* 5.7* 5.5* 4.9  CL 107 105 106 104 104  --   --  109  --  109  CO2 '22 20 22 23 '$ 18*  --   --  19*  --  17*  GLUCOSE 176* 163* 170* 192* 76  --   --  89  --  42*  BUN 36* 29* 39* 20 84*  --   --  81*  --  85*  CALCIUM 9.3 9.9 9.2 9.6 8.5*  --   --  9.9  --  8.5*  CREATININE 1.42* 1.41* 1.39* 1.23 3.00*  --   --  3.17*  --  3.35*  GFRNONAA 46* 47* 48* 55* 20*  --   --  19*  --  18*  GFRAA 54* 54* 55* >60  --   --   --   --   --   --    < > = values in this interval not displayed.    LIVER FUNCTION TESTS: Recent Labs    07/16/19 0751 10/01/19 1251 12/04/19 1147 04/15/2020 1417  BILITOT 0.4 0.3 0.7 0.7  AST '18 16 20 '$ 37  ALT '18 15 19 28  '$ ALKPHOS  --  36* 31* 66  PROT 7.2 6.9 7.5  6.1*  ALBUMIN  --  4.8* 4.3 2.4*    Assessment and Plan:  Acute CVA; occlusion of right middle cerebral artery s/p mechanical thrombectomy and intracranial stenting: Patient remains intubated/sedated. He is requiring neosynephrine for pressure support.  MR Head/Brain 04/26/20 IMPRESSION: 1. Scattered acute cortical infarcts in the right MCA territory and right occipital lobe. Two tiny cortical infarcts in the left  cerebral hemisphere. 2. Present flow in the stented right M2 branch. 3. Advanced intracranial atherosclerosis as previously characterized. 4. Sinus inflammation.  Cangrelor infusion has been discontinued and the patient has been given a loading dose of 180 mg of Brilinta along with 81 mg aspirin. Orders in place for 81 mg aspirin daily and 90 mg Brilinta daily.   Continue anticoagulation. Ok to remove right femoral dressing tomorrow. Other plans per neurology and critical care. IR will continue to follow.   Electronically Signed: Soyla Dryer, AGACNP-BC [phone] 04/26/2020, 10:57 AM   I spent a total of 15 Minutes at the the patient's bedside AND on the patient's hospital floor or unit, greater than 50% of which was counseling/coordinating care for diagnostic cerebral angiogram with mechanical thrombectomy and stent placement.

## 2020-04-26 NOTE — Progress Notes (Signed)
SLP Cancellation Note  Patient Details Name: Robert Daniel MRN: [id number] DOB: [birth year]   Cancelled treatment:       Reason Eval/Treat Not Completed: Patient not medically ready (on vent - will f/u as able).     Osie Bond., M.A. [facility] Pager [phone] Office [phone]  04/26/2020, 7:42 AM

## 2020-04-26 NOTE — Progress Notes (Signed)
PT transported from 4N18 to MRI and back w/out complication. Pt respiratory status remained stable throughout transport. RT will continue to monitor.

## 2020-04-26 NOTE — Consult Note (Signed)
NAME:  Drummond Hankes, MRN:  [id number], DOB:  [birth year], LOS: 1 ADMISSION DATE:  04/19/2020, CONSULTATION DATE:  05/04/2020 REFERRING MD:  Erlinda Hong - neuro, CHIEF COMPLAINT:  R MCA CVA, respiratory failure  Brief History:  80 yo R MCA CVA, s/p stent in NIR, remains intubated  History of Present Illness:  80 yo M PMH Afib on xarelto, HTN, DM2, rpesented to [facility] 2/19 with weakness and slurred speech. Sx began 2/18, but resolved, until 2/19 approx noon when he had L arm weakness, slurred speech, L facial droop. Last took xarelto 2/18 evening. Found to have R MCA CVA Transferred to [facility], and taken urgently to NIR for mechanical thrombectomy of proximal R M2/MCA occlusion, with subsequent reocclusion x 3, followed by intracranial stent deployment with TICI3 recanalization. NIR course also c/b pt instability-- with hypotension,  pause vs asystole with spontaneous ROSC following a prop bolus. He received 2 PRBC in NIR for hypotension; as well as albumin and neo. Remains intubated, transferring to ICU. Received repeat paralytic prior to ICU transfer   Initial labs Na 137 K 4.8 Cr 3 Hgb 8.1 BNP 1653, PCT 2.2, WBC 16.8, INR 2.2  On iSTAT 4 hours later, K 7.2, Hgb 6.8   PCCM consulted in this setting  Past Medical History:  HTN Afib HLD DM2  Significant Hospital Events:  2/19 [facility] ED as code stroke. R MCA CVA. Transferred to [facility] for NIR. Brief asystolic arrest with spontaneous ROSC vs pause in NIR. Remains intubated 2/19 Mechanical thrombectomy right M2 occlusion with TICI-3 flow.  Consults:  NIR PCCM  Procedures:   2/19 ETT> 2/19 Art line>   Significant Diagnostic Tests:  2/19 CTA H> R MCA proximal occlusion   Micro Data:  COVID-19 neg Antimicrobials:    Interim History / Subjective:   No response to painful stimulation, unable to come off propofol for any duration because of hypertension.   Objective   Blood pressure 123/71, pulse 89, temperature (!) 97.34 F (36.3 C), resp. rate (!)  23, height '6\' 1"'$  (1.854 m), weight 113 kg, SpO2 98 %.    Vent Mode: PRVC FiO2 (%):  [40 %-100 %] 40 % Set Rate:  [22 bmp] 22 bmp Vt Set:  [630 mL] 630 mL PEEP:  [5 cmH20-8 cmH20] 8 cmH20 Plateau Pressure:  [16 cmH20-25 cmH20] 25 cmH20   Intake/Output Summary (Last 24 hours) at 04/26/2020 1114 Last data filed at 04/26/2020 1000 Gross per 24 hour  Intake 7037.37 ml  Output 1055 ml  Net 5982.37 ml   Filed Weights   05/04/2020 2200  Weight: 113 kg    Examination: General: obese man intubated.  HENT:ETT secure. Dried blood nasopharynx. R earlobe active bleeding from glucose stick.  Lungs: chest clear bilaterally. Tolerating SBT.  Cardiovascular: irregular rhythm reg rate s1s2 Abdomen: Obese soft round non-distended Extremities: BLE 1+ pitting edema. No obvious joint deformity. R fem site without hematoma.  Neuro: no response to painful stimuli. PERL.  GU: Foley in place.   Resolved Hospital Problem list     Assessment & Plan:   Critically ill due to acute ischemic stroke S/P s/p mechanical thrombectomy-- subsequent reocclusion x 3, stent placement with TICI3, requiring mechanical ventilation for airway protection and titration of cleviprex to keep BP 120-140 to prevent hypoperfusion or hyperemic hemorrhage. Hypertension AKI on CKD Anemia Coagulopathy in setting of Xarelto use DM2 with hypoglycemia likely due to prolonged effect of sulfonylurea in face of worsening renal function. Leukocytosis  Plan:  - Cleviprex for BP  control to allow sedation discontinuation - EEG to rule out seizures if remains unresponsive after sedation interruption. MRI showed no hemorrhage this morning -Continue IV fluids as minimal urine output and received contrast media - Start ASA and Brillinta for stent. - Continue D10W   Daily Goals Checklist  Pain/Anxiety/Delirium protocol (if indicated): stop propofol Neuro vitals: every 1 hours AED's: none VAP protocol (if indicated): bundle in  place Respiratory support goals: SBT and extubate as mental status allows.  Blood pressure target: 120-160 DVT prophylaxis: SCD's, start [place]ria heparin Nutrition Status: hold on EN pending possible extubation, continue D10W GI prophylaxis: protonix Fluid status goals: continue IV fluids watch urine output.  Urinary catheter: Assessment of intravascular volume Central lines: PIV only Glucose control: hypoglycaemia on D10W Mobility/therapy needs: bedrest  Antibiotic de-escalation: none Home medication reconciliation: on hold Daily labs: BMP, CBC Code Status: Full  Family Communication: will update family Disposition: ICU   Goals of Care:  Last date of multidisciplinary goals of care discussion: per primary  Family and staff present:  Summary of discussion:  Follow up goals of care discussion due:-- Code Status: Full   Labs   CBC: Recent Labs  Lab 04/16/2020 1417 05/01/2020 1609 04/23/2020 1809 04/24/2020 2010 04/26/2020 2043 04/26/20 0423  WBC 16.8*  --   --  16.1*  --  12.2*  NEUTROABS 15.3*  --   --   --   --   --   HGB 8.1* 8.2* 6.8* 7.9* 7.5* 8.6*  HCT 25.0* 24.0* 20.0* 25.3* 22.0* 27.5*  MCV 88.0  --   --  90.7  --  89.6  PLT 251  --   --  226  --  [id number]    Basic Metabolic Panel: Recent Labs  Lab 04/07/2020 1417 04/19/2020 1609 04/23/2020 1809 04/21/2020 2010 04/21/2020 2043 04/26/20 0423  NA 137 139 137 142 141 141  K 4.8 5.3* 7.2* 5.7* 5.5* 4.9  CL 104  --   --  109  --  109  CO2 18*  --   --  19*  --  17*  GLUCOSE 76  --   --  89  --  42*  BUN 84*  --   --  81*  --  85*  CREATININE 3.00*  --   --  3.17*  --  3.35*  CALCIUM 8.5*  --   --  9.9  --  8.5*   GFR: Estimated Creatinine Clearance: 23.2 mL/min (A) (by C-G formula based on SCr of 3.35 mg/dL (H)). Recent Labs  Lab 04/19/2020 1411 04/26/2020 1417 04/18/2020 2010 04/26/20 0423  PROCALCITON 2.21  --   --   --   WBC  --  16.8* 16.1* 12.2*    Liver Function Tests: Recent Labs  Lab 04/14/2020 1417  AST 37  ALT 28   ALKPHOS 66  BILITOT 0.7  PROT 6.1*  ALBUMIN 2.4*   No results for input(s): LIPASE, AMYLASE in the last 168 hours. No results for input(s): AMMONIA in the last 168 hours.  ABG    Component Value Date/Time   PHART 7.291 (L) 05/04/2020 2043   PCO2ART 36.3 04/28/2020 2043   PO2ART 103 04/16/2020 2043   HCO3 17.9 (L) 04/15/2020 2043   TCO2 19 (L) 04/11/2020 2043   ACIDBASEDEF 8.0 (H) 04/17/2020 2043   O2SAT 98.0 04/10/2020 2043     Coagulation Profile: Recent Labs  Lab 04/17/2020 1417 04/26/20 0206  INR 2.2* 2.4*    Cardiac Enzymes: No results for input(s): CKTOTAL,  CKMB, CKMBINDEX, TROPONINI in the last 168 hours.  HbA1C: Hemoglobin A1C  Date/Time Value Ref Range Status  01/28/2020 10:58 AM 7.3 (A) 4.0 - 5.6 % Final  08/23/2016 12:00 AM 7.6  Final  04/05/2016 12:00 AM 7.0  Final   Hgb A1c MFr Bld  Date/Time Value Ref Range Status  04/26/2020 04:23 AM 9.8 (H) 4.8 - 5.6 % Final    Comment:    (NOTE) Pre diabetes:          5.7%-6.4%  Diabetes:              >6.4%  Glycemic control for   <7.0% adults with diabetes   07/16/2019 07:51 AM 8.3 (H) <5.7 % of total Hgb Final    Comment:    For someone without known diabetes, a hemoglobin A1c value of 6.5% or greater indicates that they may have  diabetes and this should be confirmed with a follow-up  test. . For someone with known diabetes, a value <7% indicates  that their diabetes is well controlled and a value  greater than or equal to 7% indicates suboptimal  control. A1c targets should be individualized based on  duration of diabetes, age, comorbid conditions, and  other considerations. . Currently, no consensus exists regarding use of hemoglobin A1c for diagnosis of diabetes for children. .     CBG: Recent Labs  Lab 04/26/20 0032 04/26/20 0409 04/26/20 0447 04/26/20 0744 04/26/20 0829  GLUCAP 95 38* 81 45* 89   CRITICAL CARE Performed by: Kipp Brood   Total critical care time: 45  minutes  Critical care time was exclusive of separately billable procedures and treating other patients.  Critical care was necessary to treat or prevent imminent or life-threatening deterioration.  Critical care was time spent personally by me on the following activities: development of treatment plan with patient and/or surrogate as well as nursing, discussions with consultants, evaluation of patient's response to treatment, examination of patient, obtaining history from patient or surrogate, ordering and performing treatments and interventions, ordering and review of laboratory studies, ordering and review of radiographic studies, pulse oximetry, re-evaluation of patient's condition and participation in multidisciplinary rounds.  Kipp Brood, MD [facility] ICU Physician [place]  Pager: [phone] Mobile: [phone] After hours: [phone].   04/26/2020, 11:14 AM

## 2020-04-26 NOTE — Progress Notes (Signed)
PT Cancellation Note  Patient Details Name: Robert Daniel MRN: [id number] DOB: Feb 15, 1941   Cancelled Treatment:    Reason Eval/Treat Not Completed: Patient not medically ready. Pt on vent, intubated and sedated. PT will continue to follow-up acutely and await medical appropriateness for initiation of evaluation.    [place] 04/26/2020, 8:19 AM

## 2020-04-26 NOTE — Progress Notes (Signed)
RT at bedside to find pt on PS/CPAP. RT unaware of who or when pt was placed on this ventilator mode. Pt tolerating well at this time with SVS. RT will continue to monitor pt.

## 2020-04-26 NOTE — Progress Notes (Signed)
[place] Progress Note Patient Name: Robert Daniel DOB: Mar 23, 1940 MRN: [id number]   Date of Service  04/26/2020  HPI/Events of Note  Hypoglycemia - 2 episodes - Blood glucose = 44 and 38. Already treated with D 50.   eICU Interventions  Plan: 1. D10W to run IV at 40 mL/hour. 2. Decrease LR IV infusion to 75 mL/hour.      Intervention Category Major Interventions: Other:  Sommer,Steven Cornelia Copa 04/26/2020, 5:24 AM

## 2020-04-27 ENCOUNTER — Encounter (HOSPITAL_COMMUNITY): Payer: Self-pay | Admitting: Radiology

## 2020-04-27 ENCOUNTER — Inpatient Hospital Stay (HOSPITAL_COMMUNITY): Payer: PPO

## 2020-04-27 DIAGNOSIS — R0902 Hypoxemia: Secondary | ICD-10-CM

## 2020-04-27 DIAGNOSIS — J9601 Acute respiratory failure with hypoxia: Secondary | ICD-10-CM | POA: Diagnosis not present

## 2020-04-27 DIAGNOSIS — I6389 Other cerebral infarction: Secondary | ICD-10-CM | POA: Diagnosis not present

## 2020-04-27 DIAGNOSIS — I469 Cardiac arrest, cause unspecified: Secondary | ICD-10-CM | POA: Diagnosis not present

## 2020-04-27 DIAGNOSIS — I63411 Cerebral infarction due to embolism of right middle cerebral artery: Secondary | ICD-10-CM | POA: Diagnosis not present

## 2020-04-27 DIAGNOSIS — I639 Cerebral infarction, unspecified: Secondary | ICD-10-CM | POA: Diagnosis not present

## 2020-04-27 DIAGNOSIS — Z01818 Encounter for other preprocedural examination: Secondary | ICD-10-CM | POA: Diagnosis not present

## 2020-04-27 HISTORY — PX: IR INTRA CRAN STENT: IMG2345

## 2020-04-27 LAB — POCT I-STAT 7, (LYTES, BLD GAS, ICA,H+H)
Acid-base deficit: 10 mmol/L — ABNORMAL HIGH (ref 0.0–2.0)
Acid-base deficit: 13 mmol/L — ABNORMAL HIGH (ref 0.0–2.0)
Bicarbonate: 17.6 mmol/L — ABNORMAL LOW (ref 20.0–28.0)
Bicarbonate: 15.3 mmol/L — ABNORMAL LOW (ref 20.0–28.0)
Calcium, Ion: 1.1 mmol/L — ABNORMAL LOW (ref 1.15–1.40)
Calcium, Ion: 1.05 mmol/L — ABNORMAL LOW (ref 1.15–1.40)
HCT: 21 % — ABNORMAL LOW (ref 39.0–52.0)
HCT: 20 % — ABNORMAL LOW (ref 39.0–52.0)
Hemoglobin: 7.1 g/dL — ABNORMAL LOW (ref 13.0–17.0)
Hemoglobin: 6.8 g/dL — CL (ref 13.0–17.0)
O2 Saturation: 93 %
O2 Saturation: 96 %
Patient temperature: 33.6
Potassium: 5.9 mmol/L — ABNORMAL HIGH (ref 3.5–5.1)
Potassium: 7.2 mmol/L (ref 3.5–5.1)
Sodium: 139 mmol/L (ref 135–145)
Sodium: 137 mmol/L (ref 135–145)
TCO2: 19 mmol/L — ABNORMAL LOW (ref 22–32)
TCO2: 17 mmol/L — ABNORMAL LOW (ref 22–32)
pCO2 arterial: 45.8 mmHg (ref 32.0–48.0)
pCO2 arterial: 38.8 mmHg (ref 32.0–48.0)
pH, Arterial: 7.194 — CL (ref 7.350–7.450)
pH, Arterial: 7.182 — CL (ref 7.350–7.450)
pO2, Arterial: 83 mmHg (ref 83.0–108.0)
pO2, Arterial: 91 mmHg (ref 83.0–108.0)

## 2020-04-27 LAB — BASIC METABOLIC PANEL
Anion gap: 16 — ABNORMAL HIGH (ref 5–15)
BUN: 101 mg/dL — ABNORMAL HIGH (ref 8–23)
CO2: 16 mmol/L — ABNORMAL LOW (ref 22–32)
Calcium: 8 mg/dL — ABNORMAL LOW (ref 8.9–10.3)
Chloride: 108 mmol/L (ref 98–111)
Creatinine, Ser: 3.93 mg/dL — ABNORMAL HIGH (ref 0.61–1.24)
GFR, Estimated: 15 mL/min — ABNORMAL LOW (ref >60)
Glucose, Bld: 126 mg/dL — ABNORMAL HIGH (ref 70–99)
Potassium: 5.4 mmol/L — ABNORMAL HIGH (ref 3.5–5.1)
Sodium: 140 mmol/L (ref 135–145)

## 2020-04-27 LAB — CBC
HCT: 27.7 % — ABNORMAL LOW (ref 39.0–52.0)
Hemoglobin: 8.6 g/dL — ABNORMAL LOW (ref 13.0–17.0)
MCH: 27.8 pg (ref 26.0–34.0)
MCHC: 31 g/dL (ref 30.0–36.0)
MCV: 89.6 fL (ref 80.0–100.0)
Platelets: 217 10*3/uL (ref 150–400)
RBC: 3.09 MIL/uL — ABNORMAL LOW (ref 4.22–5.81)
RDW: 15.6 % — ABNORMAL HIGH (ref 11.5–15.5)
WBC: 12.2 10*3/uL — ABNORMAL HIGH (ref 4.0–10.5)
nRBC: 0.2 % (ref 0.0–0.2)

## 2020-04-27 LAB — ECHOCARDIOGRAM COMPLETE
Area-P 1/2: 5.38 cm2
Calc EF: 67.8 %
Height: 73 in
S' Lateral: 3.9 cm
Single Plane A2C EF: 75 %
Single Plane A4C EF: 58.4 %
Weight: 3985.92 oz

## 2020-04-27 LAB — GLUCOSE, CAPILLARY
Glucose-Capillary: 92 mg/dL (ref 70–99)
Glucose-Capillary: 125 mg/dL — ABNORMAL HIGH (ref 70–99)
Glucose-Capillary: 116 mg/dL — ABNORMAL HIGH (ref 70–99)
Glucose-Capillary: 118 mg/dL — ABNORMAL HIGH (ref 70–99)
Glucose-Capillary: 130 mg/dL — ABNORMAL HIGH (ref 70–99)
Glucose-Capillary: 133 mg/dL — ABNORMAL HIGH (ref 70–99)
Glucose-Capillary: 167 mg/dL — ABNORMAL HIGH (ref 70–99)
Glucose-Capillary: 197 mg/dL — ABNORMAL HIGH (ref 70–99)

## 2020-04-27 LAB — MAGNESIUM
Magnesium: 2.1 mg/dL (ref 1.7–2.4)
Magnesium: 2.3 mg/dL (ref 1.7–2.4)

## 2020-04-27 LAB — PHOSPHORUS
Phosphorus: 8.2 mg/dL — ABNORMAL HIGH (ref 2.5–4.6)
Phosphorus: 8.4 mg/dL — ABNORMAL HIGH (ref 2.5–4.6)

## 2020-04-27 LAB — LDL CHOLESTEROL, DIRECT: Direct LDL: <10 mg/dL (ref 0–99)

## 2020-04-27 MED ORDER — FUROSEMIDE 10 MG/ML IJ SOLN
120.0000 mg | Freq: Once | INTRAVENOUS | Status: AC
  Administered 2020-04-27: 120 mg via INTRAVENOUS
  Filled 2020-04-27: qty 10

## 2020-04-27 MED ORDER — OSMOLITE 1.5 CAL PO LIQD
1000.0000 mL | ORAL | Status: DC
  Administered 2020-04-27 – 2020-05-01 (×5): 1000 mL

## 2020-04-27 MED ORDER — METOLAZONE 5 MG PO TABS
5.0000 mg | ORAL_TABLET | Freq: Once | ORAL | Status: AC
  Administered 2020-04-27: 5 mg
  Filled 2020-04-27: qty 1

## 2020-04-27 MED ORDER — ATORVASTATIN CALCIUM 80 MG PO TABS
80.0000 mg | ORAL_TABLET | Freq: Every day | ORAL | Status: DC
  Filled 2020-04-27: qty 1

## 2020-04-27 MED ORDER — FUROSEMIDE 10 MG/ML IJ SOLN
20.0000 mg/h | INTRAVENOUS | Status: DC
  Administered 2020-04-27 – 2020-04-28 (×2): 10 mg/h via INTRAVENOUS
  Filled 2020-04-27 (×2): qty 20

## 2020-04-27 MED ORDER — ATORVASTATIN CALCIUM 80 MG PO TABS
80.0000 mg | ORAL_TABLET | Freq: Every day | ORAL | Status: DC
  Administered 2020-04-28 – 2020-05-01 (×4): 80 mg
  Filled 2020-04-27 (×4): qty 1

## 2020-04-27 MED ORDER — FUROSEMIDE 10 MG/ML IJ SOLN
60.0000 mg | Freq: Once | INTRAMUSCULAR | Status: AC
  Administered 2020-04-27: 60 mg via INTRAVENOUS
  Filled 2020-04-27: qty 6

## 2020-04-27 MED ORDER — PERFLUTREN LIPID MICROSPHERE
1.0000 mL | INTRAVENOUS | Status: AC | PRN
  Administered 2020-04-27: 2 mL via INTRAVENOUS
  Filled 2020-04-27: qty 10

## 2020-04-27 NOTE — Progress Notes (Signed)
NAME:  Robert Daniel, MRN:  [id number], DOB:  [birth year], LOS: 2 ADMISSION DATE:  04/29/2020, CONSULTATION DATE:  04/17/2020 REFERRING MD:  Erlinda Hong - neuro, CHIEF COMPLAINT:  R MCA CVA, respiratory failure  Brief History:  80 yo R MCA CVA, s/p stent in NIR, remains intubated  History of Present Illness:  80 yo M PMH Afib on xarelto, HTN, DM2, rpesented to [facility] 2/19 with weakness and slurred speech. Sx began 2/18, but resolved, until 2/19 approx noon when he had L arm weakness, slurred speech, L facial droop. Last took xarelto 2/18 evening. Found to have R MCA CVA Transferred to [facility], and taken urgently to NIR for mechanical thrombectomy of proximal R M2/MCA occlusion, with subsequent reocclusion x 3, followed by intracranial stent deployment with TICI3 recanalization. NIR course also c/b pt instability-- with hypotension,  pause vs asystole with spontaneous ROSC following a prop bolus. He received 2 PRBC in NIR for hypotension; as well as albumin and neo. Remains intubated, transferring to ICU. Received repeat paralytic prior to ICU transfer   Initial labs Na 137 K 4.8 Cr 3 Hgb 8.1 BNP 1653, PCT 2.2, WBC 16.8, INR 2.2  On iSTAT 4 hours later, K 7.2, Hgb 6.8   PCCM consulted in this setting  Past Medical History:  HTN Afib HLD DM2  Significant Hospital Events:  2/19 [facility] ED as code stroke. R MCA CVA. Transferred to [facility] for NIR. Brief asystolic arrest with spontaneous ROSC vs pause in NIR. Remains intubated 2/19 Mechanical thrombectomy right M2 occlusion with TICI-3 flow.  Consults:  NIR PCCM  Procedures:   2/19 ETT> 2/19 Art line>   Significant Diagnostic Tests:  2/19 CTA H> R MCA proximal occlusion   Micro Data:  COVID-19 neg Antimicrobials:    Interim History / Subjective:   Slow to arouse following thrombectomy. Will follow commands on the right but ventilator weaning has been hampered by tachypnea and dyssynchrony on SBT.   Objective   Blood pressure (!) 154/69, pulse (!)  102, temperature 99.86 F (37.7 C), resp. rate (!) 24, height '6\' 1"'$  (1.854 m), weight 113 kg, SpO2 99 %.    Vent Mode: PRVC FiO2 (%):  [40 %] 40 % Set Rate:  [22 bmp] 22 bmp Vt Set:  [630 mL] 630 mL PEEP:  [8 cmH20] 8 cmH20 Pressure Support:  [10 cmH20] 10 cmH20 Plateau Pressure:  [20 cmH20-30 cmH20] 27 cmH20   Intake/Output Summary (Last 24 hours) at 04/27/2020 0849 Last data filed at 04/27/2020 0700 Gross per 24 hour  Intake 4301.56 ml  Output 450 ml  Net 3851.56 ml   Filed Weights   04/10/2020 2200  Weight: 113 kg    Examination: General: obese man intubated.  HENT:ETT secure. Dried blood nasopharynx. Lungs: chest clear bilaterally.  Cardiovascular: irregular rhythm reg rate s1s2, JVP elevated.  Abdomen: Obese soft round non-distended Extremities: BLE 1+ pitting edema. No obvious joint deformity. R fem site without hematoma.  Neuro: no response to painful stimuli. PERL.  GU: Foley in place, poor urine output   Patient is +9L since admission.   Resolved Hospital Problem list     Assessment & Plan:   Critically ill due to acute ischemic stroke S/P s/p mechanical thrombectomy-- subsequent reocclusion x 3, stent placement with TICI3, requiring mechanical ventilation for airway protection and titration of cleviprex to keep BP 120-140 to prevent hypoperfusion or hyperemic hemorrhage. Hypertension requiring titration of clevidipine AKI on CKD - creatinine has doubled since 9/21 Possible decompensated HFpEF based on  EF from 2019, repeat echo is pending.  Anemia of acute illness.  Coagulopathy in setting of Xarelto use DM2 with hypoglycemia likely due to prolonged effect of sulfonylurea in face of worsening renal function. Leukocytosis  Plan:  - Patient is volume overloaded which likely accounts for difficulty weaning. Will diurese today - Creatinine appears to be leveling off - suspect can avoid RRT.  - Continue ASA and Brillinta for stent, restart statin. Will hold other  lipid therapy in face of worsening renal function.  -   Daily Goals Checklist  Pain/Anxiety/Delirium protocol (if indicated): wean precedex to off , keep RASS -1  Neuro vitals: every 2 hours AED's: none VAP protocol (if indicated): bundle in place Respiratory support goals: SBT and extubate as mental status allows.  Blood pressure target: 120-160 DVT prophylaxis: SCD's, start [place] heparin Nutrition Status: continue enteral nutrition.  GI prophylaxis: protonix Fluid status goals: Diuresis. Urinary catheter: Assessment of intravascular volume Central lines: PIV only Glucose control: improved on tube feeds.  Mobility/therapy needs: bedrest  Antibiotic de-escalation: none Home medication reconciliation: on hold Daily labs: BMP, CBC Code Status: Full  Family Communication: will update family Disposition: ICU   Goals of Care:  Last date of multidisciplinary goals of care discussion: per primary  Family and staff present:  Summary of discussion:  Follow up goals of care discussion due:-- Code Status: Full   Labs   CBC: Recent Labs  Lab 04/30/2020 1417 04/22/2020 1609 04/16/2020 1809 04/22/2020 2010 05/03/2020 2043 04/26/20 0423 04/27/20 0444  WBC 16.8*  --   --  16.1*  --  12.2* 12.2*  NEUTROABS 15.3*  --   --   --   --   --   --   HGB 8.1*   < > 6.8* 7.9* 7.5* 8.6* 8.6*  HCT 25.0*   < > 20.0* 25.3* 22.0* 27.5* 27.7*  MCV 88.0  --   --  90.7  --  89.6 89.6  PLT 251  --   --  226  --  264 217   < > = values in this interval not displayed.    Basic Metabolic Panel: Recent Labs  Lab 05/03/2020 1417 05/04/2020 1609 04/17/2020 2010 04/09/2020 2043 04/26/20 0423 04/26/20 1735 04/27/20 0444  NA 137   < > 142 141 141 139 140  K 4.8   < > 5.7* 5.5* 4.9 5.0 5.4*  CL 104  --  109  --  109 108 108  CO2 18*  --  19*  --  17* 16* 16*  GLUCOSE 76  --  89  --  42* 126* 126*  BUN 84*  --  81*  --  85* 90* 101*  CREATININE 3.00*  --  3.17*  --  3.35* 3.71* 3.93*  CALCIUM 8.5*  --  9.9  --   8.5* 8.1* 8.0*  MG  --   --   --   --   --  2.1 2.1  PHOS  --   --   --   --   --  7.3* 8.2*   < > = values in this interval not displayed.   GFR: Estimated Creatinine Clearance: 19.7 mL/min (A) (by C-G formula based on SCr of 3.93 mg/dL (H)). Recent Labs  Lab 05/01/2020 1411 05/03/2020 1417 04/30/2020 2010 04/26/20 0423 04/27/20 0444  PROCALCITON 2.21  --   --   --   --   WBC  --  16.8* 16.1* 12.2* 12.2*    Liver Function Tests:  Recent Labs  Lab 04/17/2020 1417  AST 37  ALT 28  ALKPHOS 66  BILITOT 0.7  PROT 6.1*  ALBUMIN 2.4*   No results for input(s): LIPASE, AMYLASE in the last 168 hours. No results for input(s): AMMONIA in the last 168 hours.  ABG    Component Value Date/Time   PHART 7.291 (L) 05/04/2020 2043   PCO2ART 36.3 04/30/2020 2043   PO2ART 103 04/17/2020 2043   HCO3 17.9 (L) 04/12/2020 2043   TCO2 19 (L) 05/04/2020 2043   ACIDBASEDEF 8.0 (H) 04/21/2020 2043   O2SAT 98.0 04/09/2020 2043     Coagulation Profile: Recent Labs  Lab 04/30/2020 1417 04/26/20 0206  INR 2.2* 2.4*    Cardiac Enzymes: No results for input(s): CKTOTAL, CKMB, CKMBINDEX, TROPONINI in the last 168 hours.  HbA1C: Hemoglobin A1C  Date/Time Value Ref Range Status  01/28/2020 10:58 AM 7.3 (A) 4.0 - 5.6 % Final  08/23/2016 12:00 AM 7.6  Final  04/05/2016 12:00 AM 7.0  Final   Hgb A1c MFr Bld  Date/Time Value Ref Range Status  04/26/2020 04:23 AM 9.8 (H) 4.8 - 5.6 % Final    Comment:    (NOTE) Pre diabetes:          5.7%-6.4%  Diabetes:              >6.4%  Glycemic control for   <7.0% adults with diabetes   07/16/2019 07:51 AM 8.3 (H) <5.7 % of total Hgb Final    Comment:    For someone without known diabetes, a hemoglobin A1c value of 6.5% or greater indicates that they may have  diabetes and this should be confirmed with a follow-up  test. . For someone with known diabetes, a value <7% indicates  that their diabetes is well controlled and a value  greater than or  equal to 7% indicates suboptimal  control. A1c targets should be individualized based on  duration of diabetes, age, comorbid conditions, and  other considerations. . Currently, no consensus exists regarding use of hemoglobin A1c for diagnosis of diabetes for children. .     CBG: Recent Labs  Lab 04/26/20 1521 04/26/20 1928 04/26/20 2342 04/27/20 0321 04/27/20 0733  GLUCAP 121* 99 123* 116* 118*   BNP    Component Value Date/Time   BNP 1,653.5 (H) 04/10/2020 1417     CRITICAL CARE Performed by: Kipp Brood   Total critical care time: 50 minutes  Critical care time was exclusive of separately billable procedures and treating other patients.  Critical care was necessary to treat or prevent imminent or life-threatening deterioration.  Critical care was time spent personally by me on the following activities: development of treatment plan with patient and/or surrogate as well as nursing, discussions with consultants, evaluation of patient's response to treatment, examination of patient, obtaining history from patient or surrogate, ordering and performing treatments and interventions, ordering and review of laboratory studies, ordering and review of radiographic studies, pulse oximetry, re-evaluation of patient's condition and participation in multidisciplinary rounds.  Kipp Brood, MD [facility] ICU Physician [place]  Pager: [phone] Mobile: [phone] After hours: [phone].   04/27/2020, 8:41 AM

## 2020-04-27 NOTE — Discharge Instructions (Signed)
Femoral Site Care This sheet gives you information about how to care for yourself after your procedure. Your health care provider may also give you more specific instructions. If you have problems or questions, contact your health care provider. What can I expect after the procedure? After the procedure, it is common to have:  Bruising that usually fades within 1-2 weeks.  Tenderness at the site. Follow these instructions at home: Wound care 1. Follow instructions from your health care provider about how to take care of your insertion site. Make sure you: ? Wash your hands with soap and water before you change your bandage (dressing). If soap and water are not available, use hand sanitizer. ? Change your dressing as directed- pressure dressing removed 24 hours post-procedure (and switch for bandaid), bandaid removed 72 hours post-procedure 2. Do not take baths, swim, or use a hot tub for 7 days post-procedure. 3. You may shower 48 hours after the procedure or as told by your health care provider. ? Gently wash the site with plain soap and water. ? Pat the area dry with a clean towel. ? Do not rub the site. This may cause bleeding. 4. Check your site every day for signs of infection. Check for: ? Redness, swelling, or pain. ? Fluid or blood. ? Warmth. ? Pus or a bad smell. Activity  Do not stoop, bend, or lift anything that is heavier than 10 lb (4.5 kg) for 2 weeks post-procedure.  Do not drive self for 2 weeks post-procedure. Contact a health care provider if you have:  A fever or chills.  You have redness, swelling, or pain around your insertion site. Get help right away if:  The catheter insertion area swells very fast.  You pass out.  You suddenly start to sweat or your skin gets clammy.  The catheter insertion area is bleeding, and the bleeding does not stop when you hold steady pressure on the area.  The area near or just beyond the catheter insertion site becomes  pale, cool, tingly, or numb. These symptoms may represent a serious problem that is an emergency. Do not wait to see if the symptoms will go away. Get medical help right away. Call your local emergency services (911 in the [place] Do not drive yourself to the hospital.  This information is not intended to replace advice given to you by your health care provider. Make sure you discuss any questions you have with your health care provider. Document Revised: 03/06/2017 Document Reviewed: 03/06/2017 Elsevier Patient Education  2020 [employer] 

## 2020-04-27 NOTE — Progress Notes (Signed)
[place] Progress Note Patient Name: Robert Daniel DOB: 12/13/1940 MRN: [id number]   Date of Service  04/27/2020  HPI/Events of Note  Oliguria - Urine output = 130 mL in last 10 hours. Received 1 liter of LR earlier in shift without response. Creatinine = 3.93. Positive 8970 mL since admission.   eICU Interventions  Plan: 1. Lasix 60 mg IV X 1 now.      Intervention Category Major Interventions: Other:  Lysle Dingwall 04/27/2020, 5:34 AM

## 2020-04-27 NOTE — Progress Notes (Addendum)
STROKE TEAM PROGRESS NOTE   SUBJECTIVE (INTERVAL HISTORY) Pt is evaluated at the bedside. On Neuro Exam, Pt is still intubated, follows commands. Pt is able to move fingers on Rt side but not on Left. Not able to lift extremities against gravity. Pt has fluid overload. Systolic BP between [id number] -[id number] and diastolic between 123XX123 mmHg. Pt had temp of 100.04 at 9 am and 100.4 at 11 am.  Na 140, K high at 5.4, creatinine elevated at 3.93, P at 8.2. WBC's 12.2. Pt is on ASA 81 mg and brilinta 90 mg due to stenting.  On Precedex now. ECHO done results pending. As per Dr. Abigail Butts, Pt will undergo more diuresis today with lasix and hope to wean off vent if able this pm.   OBJECTIVE Temp:  [97.16 F (36.2 C)-99.9 F (37.7 C)] 99.86 F (37.7 C) (02/21 0800) Pulse Rate:  [74-116] 102 (02/21 0800) Cardiac Rhythm: Atrial fibrillation (02/20 2000) Resp:  [20-33] 24 (02/21 0800) BP: (104-195)/(44-92) 154/69 (02/21 0800) SpO2:  [90 %-100 %] 99 % (02/21 0800) Arterial Line BP: (84-201)/(40-85) 114/49 (02/21 0800) FiO2 (%):  [40 %] 40 % (02/21 0740)  Recent Labs  Lab 04/26/20 1521 04/26/20 1928 04/26/20 2342 04/27/20 0321 04/27/20 0733  GLUCAP 121* 99 123* 116* 118*   Recent Labs  Lab 04/21/2020 1417 04/24/2020 1609 04/18/2020 2010 04/23/2020 2043 04/26/20 0423 04/26/20 1735 04/27/20 0444  NA 137   < > 142 141 141 139 140  K 4.8   < > 5.7* 5.5* 4.9 5.0 5.4*  CL 104  --  109  --  109 108 108  CO2 18*  --  19*  --  17* 16* 16*  GLUCOSE 76  --  89  --  42* 126* 126*  BUN 84*  --  81*  --  85* 90* 101*  CREATININE 3.00*  --  3.17*  --  3.35* 3.71* 3.93*  CALCIUM 8.5*  --  9.9  --  8.5* 8.1* 8.0*  MG  --   --   --   --   --  2.1 2.1  PHOS  --   --   --   --   --  7.3* 8.2*   < > = values in this interval not displayed.   Recent Labs  Lab 04/16/2020 1417  AST 37  ALT 28  ALKPHOS 66  BILITOT 0.7  PROT 6.1*  ALBUMIN 2.4*   Recent Labs  Lab 04/24/2020 1417 04/26/2020 1609 04/11/2020 1809  04/19/2020 2010 04/30/2020 2043 04/26/20 0423 04/27/20 0444  WBC 16.8*  --   --  16.1*  --  12.2* 12.2*  NEUTROABS 15.3*  --   --   --   --   --   --   HGB 8.1*   < > 6.8* 7.9* 7.5* 8.6* 8.6*  HCT 25.0*   < > 20.0* 25.3* 22.0* 27.5* 27.7*  MCV 88.0  --   --  90.7  --  89.6 89.6  PLT 251  --   --  226  --  264 217   < > = values in this interval not displayed.   No results for input(s): CKTOTAL, CKMB, CKMBINDEX, TROPONINI in the last 168 hours. Recent Labs    04/11/2020 1417 04/26/20 0206  LABPROT 23.8* 25.5*  INR 2.2* 2.4*   No results for input(s): COLORURINE, LABSPEC, PHURINE, GLUCOSEU, HGBUR, BILIRUBINUR, KETONESUR, PROTEINUR, UROBILINOGEN, NITRITE, LEUKOCYTESUR in the last 72 hours.  Invalid input(s): APPERANCEUR  Component Value Date/Time   CHOL 57 04/26/2020 0423   TRIG 180 (H) 04/26/2020 0423   HDL <10 (L) 04/26/2020 0423   CHOLHDL NOT CALCULATED 04/26/2020 0423   VLDL 36 04/26/2020 0423   LDLCALC NOT CALCULATED 04/26/2020 0423   LDLCALC 121 (H) 07/16/2019 0751   Lab Results  Component Value Date   HGBA1C 9.8 (H) 04/26/2020   No results found for: LABOPIA, COCAINSCRNUR, [employer], [place], [facility], [place]  Recent Labs  Lab 04/12/2020 [place] <10    I have personally reviewed the radiological images below and agree with the radiology interpretations.  DG Chest 1 View  Result Date: 04/20/2020 CLINICAL DATA:  Acute shortness of breath.  CVA. EXAM: CHEST  1 VIEW COMPARISON:  07/08/2019 chest radiograph FINDINGS: This is a low volume study. The cardiomediastinal silhouette is unchanged. Mild pulmonary vascular congestion noted. There is no evidence of focal airspace disease, pulmonary edema, suspicious pulmonary nodule/mass, pleural effusion, or pneumothorax. No acute bony abnormalities are identified. IMPRESSION: Mild pulmonary vascular congestion. Electronically Signed   By: Margarette Canada M.D.   On: 05/03/2020 15:02   MR ANGIO HEAD WO CONTRAST  Result Date:  04/26/2020 CLINICAL DATA:  Stroke follow-up. EXAM: MRI HEAD WITHOUT CONTRAST MRA HEAD WITHOUT CONTRAST TECHNIQUE: Multiplanar, multiecho pulse sequences of the brain and surrounding structures were obtained without intravenous contrast. Angiographic images of the head were obtained using MRA technique without contrast. COMPARISON:  CT a of the head neck from yesterday FINDINGS: MRI HEAD FINDINGS Brain: Patchy cortical infarction along the right MCA territory and reaching the right occipital cortex in this patient with large right posterior communicating artery. Two tiny cortical infarcts in the left MCA to distal ACA territory. No hemorrhage, mass effect, or hydrocephalus. Age normal brain volume. Mild pre-existing ischemic disease for age. Vascular: Arterial findings below. Skull and upper cervical spine: Normal marrow signal Sinuses/Orbits: Generalized mucosal thickening with fluid levels. MRA HEAD FINDINGS Right M2 stenting with expected associated artifact but visible internal flow on source images. On 3D reformats there is apparent high-grade stenosis with flow gap in the region of the M2 takeoff, but this is likely primarily from artifact based on source images. There is moderate atheromatous irregularity of bilateral MCA branches. Diffuse atheromatous irregularity of the left V4 segment and basilar with overall moderate stenosis except at the vertebrobasilar junction where narrowing appears high-grade. The right V4 segment is not seen until the patent PICA. IMPRESSION: 1. Scattered acute cortical infarcts in the right MCA territory and right occipital lobe. Two tiny cortical infarcts in the left cerebral hemisphere. 2. Present flow in the stented right M2 branch. 3. Advanced intracranial atherosclerosis as previously characterized. 4. Sinus inflammation. Electronically Signed   By: Monte Fantasia M.D.   On: 04/26/2020 04:34   MR BRAIN WO CONTRAST  Result Date: 04/26/2020 CLINICAL DATA:  Stroke follow-up.  EXAM: MRI HEAD WITHOUT CONTRAST MRA HEAD WITHOUT CONTRAST TECHNIQUE: Multiplanar, multiecho pulse sequences of the brain and surrounding structures were obtained without intravenous contrast. Angiographic images of the head were obtained using MRA technique without contrast. COMPARISON:  CT a of the head neck from yesterday FINDINGS: MRI HEAD FINDINGS Brain: Patchy cortical infarction along the right MCA territory and reaching the right occipital cortex in this patient with large right posterior communicating artery. Two tiny cortical infarcts in the left MCA to distal ACA territory. No hemorrhage, mass effect, or hydrocephalus. Age normal brain volume. Mild pre-existing ischemic disease for age. Vascular: Arterial findings below. Skull and  upper cervical spine: Normal marrow signal Sinuses/Orbits: Generalized mucosal thickening with fluid levels. MRA HEAD FINDINGS Right M2 stenting with expected associated artifact but visible internal flow on source images. On 3D reformats there is apparent high-grade stenosis with flow gap in the region of the M2 takeoff, but this is likely primarily from artifact based on source images. There is moderate atheromatous irregularity of bilateral MCA branches. Diffuse atheromatous irregularity of the left V4 segment and basilar with overall moderate stenosis except at the vertebrobasilar junction where narrowing appears high-grade. The right V4 segment is not seen until the patent PICA. IMPRESSION: 1. Scattered acute cortical infarcts in the right MCA territory and right occipital lobe. Two tiny cortical infarcts in the left cerebral hemisphere. 2. Present flow in the stented right M2 branch. 3. Advanced intracranial atherosclerosis as previously characterized. 4. Sinus inflammation. Electronically Signed   By: Monte Fantasia M.D.   On: 04/26/2020 04:34   CT CEREBRAL PERFUSION W CONTRAST  Result Date: 05/03/2020 CLINICAL DATA:  Slurred speech and left-sided weakness. EXAM: CT  ANGIOGRAPHY HEAD AND NECK CT PERFUSION BRAIN TECHNIQUE: Multidetector CT imaging of the head and neck was performed using the standard protocol during bolus administration of intravenous contrast. Multiplanar CT image reconstructions and MIPs were obtained to evaluate the vascular anatomy. Carotid stenosis measurements (when applicable) are obtained utilizing NASCET criteria, using the distal internal carotid diameter as the denominator. Multiphase CT imaging of the brain was performed following IV bolus contrast injection. Subsequent parametric perfusion maps were calculated using RAPID software. CONTRAST:  16m OMNIPAQUE IOHEXOL 350 MG/ML SOLN COMPARISON:  None. FINDINGS: CT HEAD FINDINGS Brain: There is no evidence of an acute infarct, intracranial hemorrhage, mass, midline shift, or extra-axial fluid collection. Mild cerebral atrophy is within normal limits for age. Patchy hypodensities in the cerebral white matter bilaterally are nonspecific but compatible with mild chronic small vessel ischemic disease. There is a chronic lacunar infarct at the inferior aspect of the right caudate head. Vascular: Calcified atherosclerosis at the skull base. No hyperdense vessel. Skull: No fracture or suspicious osseous lesion. Sinuses/Orbits: Mild scattered mucosal thickening in the paranasal sinuses. Clear mastoid air cells. Bilateral cataract extraction. Other: None. ASPECTS ([place] Early CT Score) - Ganglionic level infarction (caudate, lentiform nuclei, internal capsule, insula, M1-M3 cortex): 7 - Supraganglionic infarction (M4-M6 cortex): 3 Total score (0-10 with 10 being normal): 10 Review of the MIP images confirms the above findings CTA NECK FINDINGS Aortic arch: Normal variant aortic arch branching pattern with common origin of the brachiocephalic and left common carotid arteries. Mild calcified plaque in the aortic arch without evidence of a significant arch vessel origin stenosis. Right carotid  system: Patent with mild scattered predominantly calcified plaque throughout the common carotid artery and at the carotid bifurcation. No evidence of a significant stenosis or dissection. Left carotid system: Patent with mild plaque in the mid common carotid artery not resulting in significant stenosis. More extensive, heavily calcified plaque at the carotid bifurcation results in less than 50% proximal ICA stenosis. No evidence of dissection. Vertebral arteries: There is either severe stenosis or short segment occlusion of the proximal left vertebral artery at its origin with the vessel being patent throughout the remainder of the neck. There is a diffusely thready appearance of the right vertebral artery V1 and V2 segments with the right V3 segment being occluded. Skeleton: Advanced cervical disc and facet degeneration. Other neck: No evidence of cervical lymphadenopathy or mass. Upper chest: Partially visualized small right pleural effusion.  No apical lung consolidation. Review of the MIP images confirms the above findings CTA HEAD FINDINGS Anterior circulation: The internal carotid arteries are patent from skull base to carotid termini with calcified plaque resulting in mild cavernous and proximal supraclinoid stenosis bilaterally. The right M1 segment is patent with a mild stenosis proximally. There is a decreased number of distal right MCA branch vessels compared to the left, however a discrete proximal branch occlusion is not identified. The left MCA is patent with branch vessel irregularity as well as a mild-to-moderate proximal M2 stenosis. Both ACAs are patent without evidence of a significant proximal stenosis. No aneurysm is identified. Posterior circulation: The intracranial left vertebral artery is patent but diffusely irregular and narrowed with multiple moderate to severe stenoses. The right V4 segment is occluded proximally with likely retrograde filling distally supplying the right PICA. The  basilar artery is patent but diffusely small and irregular which is partly congenital though with suspected superimposed atherosclerotic narrowing as well. Patent SCAs are seen bilaterally. There is a fetal origin of the PCAs without evidence of a significant proximal PCA stenosis. No aneurysm is identified. Venous sinuses: Patent. Anatomic variants: Fetal origin of the PCAs. Review of the MIP images confirms the above findings CT Brain Perfusion Findings: ASPECTS: 10 CBF (<30%) Volume: 0 mL Perfusion (Tmax>6.0s) volume: 55 mL Mismatch Volume: 55 mL Infarction Location: No core infarct evident by CTP. Ischemic penumbra in the right MCA territory, greatest in the frontal lobe at and superior to the operculum though with parietal and temporal lobe involvement as well. IMPRESSION: CT HEAD: 1. No evidence of acute intracranial abnormality.  ASPECTS of 10. 2. Mild chronic small vessel ischemic disease. CTA HEAD AND NECK: 1. Decreased number of right MCA branch vessels without a discrete proximal occlusion identified. It is possible that this reflects a stump occlusion of a proximal to mid M2 branch which is not readily apparent by CTA. 2. Intracranial atherosclerosis with multiple anterior and posterior circulation stenoses as detailed above. 3. 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 4. Severe stenosis or short segment occlusion of the left vertebral artery at its origin with patency of the vessel more distally. Heavily diseased left V4 segment. 5. Cervical carotid atherosclerosis without significant stenosis. 6.  Aortic Atherosclerosis (ICD10-I70.0). CT BRAIN PERFUSION: 55 mL of ischemic penumbra in the right MCA territory without evidence of a core infarct. These results were called by telephone at the time of interpretation on 04/11/2020 at 2:15 pm to Dr. Donnetta Simpers, who verbally acknowledged these results. Electronically Signed   By: Logan Bores M.D.   On: 04/11/2020 14:58   DG CHEST PORT 1  VIEW  Result Date: 04/26/2020 CLINICAL DATA:  Hypoxia EXAM: PORTABLE CHEST 1 VIEW COMPARISON:  04/13/2020 FINDINGS: The endotracheal tube terminates above the carina. There is elevation of the right hemidiaphragm as before. There are persistent but improving perihilar airspace opacities. There is no pneumothorax. No definite acute displaced fracture. No large pleural effusion. IMPRESSION: 1. Improving bilateral perihilar airspace opacities. 2. Endotracheal tube as above. Electronically Signed   By: Constance Holster M.D.   On: 04/26/2020 15:20   DG CHEST PORT 1 VIEW  Result Date: 04/30/2020 CLINICAL DATA:  Endotracheal placement EXAM: PORTABLE CHEST 1 VIEW COMPARISON:  Earlier same day FINDINGS: Endotracheal tube tip 4 cm above the carina. There is worsened atelectasis in the perihilar regions both sides. Possible pulmonary venous hypertension without Ahmed edema. No visible effusion. IMPRESSION: Endotracheal tube tip 4 cm above the carina. Worsened  perihilar atelectasis. Possible pulmonary venous hypertension. No Pranay edema. No visible effusion or pneumothorax. Electronically Signed   By: Nelson Chimes M.D.   On: 04/29/2020 19:29   DG Abd Portable 1V  Result Date: 04/26/2020 CLINICAL DATA:  OG tube placement. EXAM: PORTABLE ABDOMEN - 1 VIEW COMPARISON:  None. FINDINGS: An enteric tube is identified with tip overlying the proximal-mid stomach. IMPRESSION: Enteric tube with tip overlying the proximal-mid stomach. Electronically Signed   By: Margarette Canada M.D.   On: 04/26/2020 09:28   CT ANGIO HEAD CODE STROKE  Result Date: 04/23/2020 CLINICAL DATA:  Slurred speech and left-sided weakness. EXAM: CT ANGIOGRAPHY HEAD AND NECK CT PERFUSION BRAIN TECHNIQUE: Multidetector CT imaging of the head and neck was performed using the standard protocol during bolus administration of intravenous contrast. Multiplanar CT image reconstructions and MIPs were obtained to evaluate the vascular anatomy. Carotid stenosis  measurements (when applicable) are obtained utilizing NASCET criteria, using the distal internal carotid diameter as the denominator. Multiphase CT imaging of the brain was performed following IV bolus contrast injection. Subsequent parametric perfusion maps were calculated using RAPID software. CONTRAST:  82m OMNIPAQUE IOHEXOL 350 MG/ML SOLN COMPARISON:  None. FINDINGS: CT HEAD FINDINGS Brain: There is no evidence of an acute infarct, intracranial hemorrhage, mass, midline shift, or extra-axial fluid collection. Mild cerebral atrophy is within normal limits for age. Patchy hypodensities in the cerebral white matter bilaterally are nonspecific but compatible with mild chronic small vessel ischemic disease. There is a chronic lacunar infarct at the inferior aspect of the right caudate head. Vascular: Calcified atherosclerosis at the skull base. No hyperdense vessel. Skull: No fracture or suspicious osseous lesion. Sinuses/Orbits: Mild scattered mucosal thickening in the paranasal sinuses. Clear mastoid air cells. Bilateral cataract extraction. Other: None. ASPECTS ([place]roke Program Early CT Score) - Ganglionic level infarction (caudate, lentiform nuclei, internal capsule, insula, M1-M3 cortex): 7 - Supraganglionic infarction (M4-M6 cortex): 3 Total score (0-10 with 10 being normal): 10 Review of the MIP images confirms the above findings CTA NECK FINDINGS Aortic arch: Normal variant aortic arch branching pattern with common origin of the brachiocephalic and left common carotid arteries. Mild calcified plaque in the aortic arch without evidence of a significant arch vessel origin stenosis. Right carotid system: Patent with mild scattered predominantly calcified plaque throughout the common carotid artery and at the carotid bifurcation. No evidence of a significant stenosis or dissection. Left carotid system: Patent with mild plaque in the mid common carotid artery not resulting in significant stenosis. More  extensive, heavily calcified plaque at the carotid bifurcation results in less than 50% proximal ICA stenosis. No evidence of dissection. Vertebral arteries: There is either severe stenosis or short segment occlusion of the proximal left vertebral artery at its origin with the vessel being patent throughout the remainder of the neck. There is a diffusely thready appearance of the right vertebral artery V1 and V2 segments with the right V3 segment being occluded. Skeleton: Advanced cervical disc and facet degeneration. Other neck: No evidence of cervical lymphadenopathy or mass. Upper chest: Partially visualized small right pleural effusion. No apical lung consolidation. Review of the MIP images confirms the above findings CTA HEAD FINDINGS Anterior circulation: The internal carotid arteries are patent from skull base to carotid termini with calcified plaque resulting in mild cavernous and proximal supraclinoid stenosis bilaterally. The right M1 segment is patent with a mild stenosis proximally. There is a decreased number of distal right MCA branch vessels compared to the left, however a discrete  proximal branch occlusion is not identified. The left MCA is patent with branch vessel irregularity as well as a mild-to-moderate proximal M2 stenosis. Both ACAs are patent without evidence of a significant proximal stenosis. No aneurysm is identified. Posterior circulation: The intracranial left vertebral artery is patent but diffusely irregular and narrowed with multiple moderate to severe stenoses. The right V4 segment is occluded proximally with likely retrograde filling distally supplying the right PICA. 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 Patent SCAs are seen bilaterally. There is a fetal origin of the PCAs without evidence of a significant proximal PCA stenosis. No aneurysm is identified. Venous  sinuses: Patent. Anatomic variants: Fetal origin of the PCAs. Review of the MIP images confirms the above findings CT Brain Perfusion Findings: ASPECTS: 10 CBF (<30%) Volume: 0 mL Perfusion (Tmax>6.0s) volume: 55 mL Mismatch Volume: 55 mL Infarction Location: No core infarct evident by CTP. Ischemic penumbra in the right MCA territory, greatest in the frontal lobe at and superior to the operculum though with parietal and temporal lobe involvement as well. IMPRESSION: CT HEAD: 1. No evidence of acute intracranial abnormality.  ASPECTS of 10. 2. Mild chronic small vessel ischemic disease. CTA HEAD AND NECK: 1. Decreased number of right MCA branch vessels without a discrete proximal occlusion identified. It is possible that this reflects a stump occlusion of a proximal to mid M2 branch which is not readily apparent by CTA. 2. Intracranial atherosclerosis with multiple anterior and posterior circulation stenoses as detailed above. 3. 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 4. Severe stenosis or short segment occlusion of the left vertebral artery at its origin with patency of the vessel more distally. Heavily diseased left V4 segment. 5. Cervical carotid atherosclerosis without significant stenosis. 6.  Aortic Atherosclerosis (ICD10-I70.0). CT BRAIN PERFUSION: 55 mL of ischemic penumbra in the right MCA territory without evidence of a core infarct. These results were called by telephone at the time of interpretation on 04/29/2020 at 2:15 pm to Dr. Donnetta Simpers, who verbally acknowledged these results. Electronically Signed   By: Logan Bores M.D.   On: 04/27/2020 14:58   CT ANGIO NECK CODE STROKE  Result Date: 04/09/2020 CLINICAL DATA:  Slurred speech and left-sided weakness. EXAM: CT ANGIOGRAPHY HEAD AND NECK CT PERFUSION BRAIN TECHNIQUE: Multidetector CT imaging of the head and neck was performed using the standard protocol during bolus administration of intravenous contrast. Multiplanar CT image  reconstructions and MIPs were obtained to evaluate the vascular anatomy. Carotid stenosis measurements (when applicable) are obtained utilizing NASCET criteria, using the distal internal carotid diameter as the denominator. Multiphase CT imaging of the brain was performed following IV bolus contrast injection. Subsequent parametric perfusion maps were calculated using RAPID software. CONTRAST:  4m OMNIPAQUE IOHEXOL 350 MG/ML SOLN COMPARISON:  None. FINDINGS: CT HEAD FINDINGS Brain: There is no evidence of an acute infarct, intracranial hemorrhage, mass, midline shift, or extra-axial fluid collection. Mild cerebral atrophy is within normal limits for age. Patchy hypodensities in the cerebral white matter bilaterally are nonspecific but compatible with mild chronic small vessel ischemic disease. There is a chronic lacunar infarct at the inferior aspect of the right caudate head. Vascular: Calcified atherosclerosis at the skull base. No hyperdense vessel. Skull: No fracture or suspicious osseous lesion. Sinuses/Orbits: Mild scattered mucosal thickening in the paranasal sinuses. Clear mastoid air cells. Bilateral cataract extraction. Other: None. ASPECTS ([facility]) - Ganglionic  level infarction (caudate, lentiform nuclei, internal capsule, insula, M1-M3 cortex): 7 - Supraganglionic infarction (M4-M6 cortex): 3 Total score (0-10 with 10 being normal): 10 Review of the MIP images confirms the above findings CTA NECK FINDINGS Aortic arch: Normal variant aortic arch branching pattern with common origin of the brachiocephalic and left common carotid arteries. Mild calcified plaque in the aortic arch without evidence of a significant arch vessel origin stenosis. Right carotid system: Patent with mild scattered predominantly calcified plaque throughout the common carotid artery and at the carotid bifurcation. No evidence of a significant stenosis or dissection. Left carotid system: Patent with  mild plaque in the mid common carotid artery not resulting in significant stenosis. More extensive, heavily calcified plaque at the carotid bifurcation results in less than 50% proximal ICA stenosis. No evidence of dissection. Vertebral arteries: There is either severe stenosis or short segment occlusion of the proximal left vertebral artery at its origin with the vessel being patent throughout the remainder of the neck. There is a diffusely thready appearance of the right vertebral artery V1 and V2 segments with the right V3 segment being occluded. Skeleton: Advanced cervical disc and facet degeneration. Other neck: No evidence of cervical lymphadenopathy or mass. Upper chest: Partially visualized small right pleural effusion. No apical lung consolidation. Review of the MIP images confirms the above findings CTA HEAD FINDINGS Anterior circulation: The internal carotid arteries are patent from skull base to carotid termini with calcified plaque resulting in mild cavernous and proximal supraclinoid stenosis bilaterally. The right M1 segment is patent with a mild stenosis proximally. There is a decreased number of distal right MCA branch vessels compared to the left, however a discrete proximal branch occlusion is not identified. The left MCA is patent with branch vessel irregularity as well as a mild-to-moderate proximal M2 stenosis. Both ACAs are patent without evidence of a significant proximal stenosis. No aneurysm is identified. Posterior circulation: The intracranial left vertebral artery is patent but diffusely irregular and narrowed with multiple moderate to severe stenoses. The right V4 segment is occluded proximally with likely retrograde filling distally supplying the right PICA. 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 Patent SCAs are seen bilaterally. There is a fetal origin of the PCAs  without evidence of a significant proximal PCA stenosis. No aneurysm is identified. Venous sinuses: Patent. Anatomic variants: Fetal origin of the PCAs. Review of the MIP images confirms the above findings CT Brain Perfusion Findings: ASPECTS: 10 CBF (<30%) Volume: 0 mL Perfusion (Tmax>6.0s) volume: 55 mL Mismatch Volume: 55 mL Infarction Location: No core infarct evident by CTP. Ischemic penumbra in the right MCA territory, greatest in the frontal lobe at and superior to the operculum though with parietal and temporal lobe involvement as well. IMPRESSION: CT HEAD: 1. No evidence of acute intracranial abnormality.  ASPECTS of 10. 2. Mild chronic small vessel ischemic disease. CTA HEAD AND NECK: 1. Decreased number of right MCA branch vessels without a discrete proximal occlusion identified. It is possible that this reflects a stump occlusion of a proximal to mid M2 branch which is not readily apparent by CTA. 2. Intracranial atherosclerosis with multiple anterior and posterior circulation stenoses as detailed above. 3. 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 4. Severe stenosis or short segment occlusion of the left vertebral artery at its origin with patency of the vessel more distally. Heavily diseased left V4 segment. 5. Cervical carotid atherosclerosis without significant  stenosis. 6.  Aortic Atherosclerosis (ICD10-I70.0). CT BRAIN PERFUSION: 55 mL of ischemic penumbra in the right MCA territory without evidence of a core infarct. These results were called by telephone at the time of interpretation on 04/16/2020 at 2:15 pm to Dr. Donnetta Simpers, who verbally acknowledged these results. Electronically Signed   By: Logan Bores M.D.   On: 04/14/2020 14:58    PHYSICAL EXAM  Temp:  [97.16 F (36.2 C)-99.9 F (37.7 C)] 99.86 F (37.7 C) (02/21 0800) Pulse Rate:  [74-116] 102 (02/21 0800) Resp:  [20-33] 24 (02/21 0800) BP: (104-195)/(44-92) 154/69 (02/21 0800) SpO2:  [90 %-100 %] 99 % (02/21  0800) Arterial Line BP: (84-201)/(40-85) 114/49 (02/21 0800) FiO2 (%):  [40 %] 40 % (02/21 0740)  General - Well nourished, well developed, intubated on sedation.  Ophthalmologic - fundi not visualized due to noncooperation.  Cardiovascular - irregularly irregular heart rate and rhythm.  Neuro - On exam, intubated on precedex, lethargic, eyes closed, but trying to open. His right eye can half way open briefly. Able to follow simple commands on the right hand and foot. With forced eye opening, eyes in mid position, not blinking to visual threat, doll's eyes sluggish, not tracking. Corneal reflex weakly present, gag and cough present. Breathing barely over the vent.  Facial symmetry not able to test due to ET tube.  Tongue protrusion not cooperative. Able to move right fingers and toes on request. Not able to raise against gravity. On pain stimulation, mild withdraw on left UE and LE but minimal movement RLE and no movement RUE. DTR diminished and right positive babinski. Sensation, coordination and gait not tested.  ASSESSMENT/PLAN Robert Daniel is a 80 y.o. male with history of PMHx of DM II, HTN, AF s/p cardioversion in 11/2019 on Xarelto, and HLD transfer from [facility] for significant left arm weakness, slurred speech and left facial droop. Time onset 1200pm. NIHSS = 4. CT no acute finding. CTA head and neck left ICA bulb and b/l ICA siphon severe athero with stenosis. Possible right M2 branch occlusion. CTP right MCA penumbra. Last dose Xarelto last night. Pt not tPA candidate given on Xarelto. He was transferred to [facility] for EVT.  ECHO done results pending.    Stroke:  right MCA scattered infarcts and two left ACA punctate infarcts due to left M2 occlusion s/p stenting, more likely large vessel stenosis given plaque occluding left M2. However, also possible due to AF despite on Xarelto  CT no acute finding.   CTA head and neck left ICA bulb and b/l ICA siphon severe athero with stenosis.  Questionable right M2 branch occlusion.   CTP right MCA penumbra.  IR - right M2 branch occlusion with plaque - s/p stenting  MRI  Scattered acute cortical infarcts in the right MCA territory and right occipital lobe. Two tiny cortical infarcts in the left cerebral hemisphere.  MRA  Present flow in the stented right M2 branch.  2D Echo  Done , results pending  LDL pending  HgbA1c 9.8  Heparin subq for VTE prophylaxis  Xarelto (rivaroxaban) daily prior to admission, now on aspirin 81 mg daily and Brilinta (ticagrelor) 90 mg bid due to stenting  Ongoing aggressive stroke risk factor management  Therapy recommendations:  pending  Disposition:  pending  Respiratory failure  Intubated for procedure  Not able to extubate after procedure  CCM on board  Plan to Wean off vent this pm.  Cardiac arrest  CHF  Cardiac arrest for 20-30s and  ROSC - no CPR needed  Concerning for CHF   CXR showed no Ghali edema  TTE pending  Tele monitoring  Chronic AF   s/p cardioversion in 11/2019   on Xarelto  Rate controlled  Need to resume AC once appropriate  Intracranial stenosis  CTA head and neck left ICA bulb and b/l ICA siphon severe athero with stenosis.  Avoid low BP  Long term BP goal 130-150  Diabetes  HgbA1c 9.8 goal < 7.0  Uncontrolled  CBG monitoring (130 today)   SSI  Hypoglycemia yesterday to 45  Consider tube feeding if not able to wean off vent  DM education and close PCP follow up  Hypertension . Fluctuate according to sedation status . BP goal 120-140 for 24h post IR  Long term BP goal 130-150 given intracranial stenosis  Hyperlipidemia  Home meds:  lipitor 80   LDL pending, goal < 70  Resume lipitor 80  Continue statin at discharge  AKI ? Vs. CKD ?  Cre 3.00->3.35->3.71>3.93  Hyperkalemia -5.4  CCM on board  Could be related to iodine contrast  On IVF  Other Stroke Risk Factors  Advanced age  Obesity, Body mass  index is 32.87 kg/m.   Other Active Problems  Hyperkalemia - 7.2->5.5->5.0>5.4  Leukocytosis - WBC 16.1->12.2  Hospital day # 2    ATTENDING NOTE: I reviewed above note and agree with the assessment and plan. Pt was seen and examined.   Pt no acute event overnight, but oligouria and still has fluid overload. Not able to wean off vent, Dr. Lynetta Mare will do more diuresis today and hopefully wean off vent. Pt is able to follow commands on the right but still has left hemiplegia. Now on precedex. BP fluctuate and difficult to control.   On exam, intubated on precedex, lethargic, eyes closed, but trying to open. His right eye can half way open briefly. Able to follow simple commands on the right hand and foot. With forced eye opening, eyes in mid position, not blinking to visual threat, doll's eyes sluggish, not tracking. Corneal reflex weakly present, gag and cough present. Breathing barely over the vent.  Facial symmetry not able to test due to ET tube.  Tongue protrusion not cooperative. Able to move right fingers and toes on request. Not able to raise against gravity. On pain stimulation, mild withdraw on left UE and LE but minimal movement RLE and no movement RUE. DTR diminished and right positive babinski. Sensation, coordination and gait not tested.  Pt will undergo diuresis today with lasix and hope to wean off vent if able this pm. Will continue ASA and brilinta and lipitor. CCM is working on BP control and AKI. I have discussed with Dr. Lynetta Mare.   Rosalin Hawking, MD PhD Stroke Neurology 04/27/2020 10:33 AM  This patient is critically ill due to right MCA infarcts, respiratory failure, cardiac arrest, severe intracranial stenosis, AKI and at significant risk of neurological worsening, death form recurrent stroke, hemorrhagic conversion, seizure. This patient's care requires constant monitoring of vital signs, hemodynamics, respiratory and cardiac monitoring, review of multiple databases,  neurological assessment, discussion with family, other specialists and medical decision making of high complexity. I spent 40 minutes of neurocritical care time in the care of this patient.   To contact Stroke Continuity provider, please refer to [email or web address] After hours, contact General Neurology

## 2020-04-27 NOTE — Progress Notes (Signed)
SLP Cancellation Note  Patient Details Name: Robert Daniel MRN: [id number] DOB: [birth year]   Cancelled treatment:       Reason Eval/Treat Not Completed: Medical issues which prohibited therapy (remains on vent this am). Will f/u as able.   Osie Bond., M.A. [place] [facility] Pager [phone] Office [phone]  04/27/2020, 7:53 AM

## 2020-04-27 NOTE — Progress Notes (Signed)
  Echocardiogram 2D Echocardiogram has been performed.  Robert Daniel 04/27/2020, 11:27 AM

## 2020-04-27 NOTE — Progress Notes (Signed)
Referring Physician(s): Code stroke- Donnetta Simpers (neurology)  Supervising Physician: Pedro Earls  Patient Status:  [facility] - In-pt  Chief Complaint: Stroke F/U  Subjective:  History of acute CVA s/p cerebral arteriogram with emergent mechanical thrombectomy and stent placement of right MCA M2 occlusion achieving a TICI 3 revascularization via right femoral approach 04/18/2020 by Dr. Karenann Cai. Patient laying in bed intubated with sedation. He responds to voice (follows simple commands to voice), eyes remain closed. Can wiggle bilateral toes and grip hand with RUE, no movements of LUE. Right femoral puncture site c/d/i.  MR/MRA brain/head 04/26/2020: 1. Scattered acute cortical infarcts in the right MCA territory and right occipital lobe. Two tiny cortical infarcts in the left cerebral hemisphere. 2. Present flow in the stented right M2 branch. 3. Advanced intracranial atherosclerosis as previously characterized. 4. Sinus inflammation.   Allergies: Patient has no known allergies.  Medications: Prior to Admission medications   Medication Sig Start Date End Date Taking? Authorizing Provider  rivaroxaban (XARELTO) 20 MG TABS tablet Take 1 tablet (20 mg total) by mouth daily with supper. 03/26/20  Yes Minna Merritts, MD  acetaminophen (TYLENOL) 500 MG tablet Take 500 mg by mouth every 6 (six) hours as needed.    [provider]  Ascorbic Acid (VITAMIN C) 1000 MG tablet Take 1,000 mg by mouth daily.    [provider]  aspirin EC 81 MG tablet Take 81 mg by mouth daily.    [provider]  atorvastatin (LIPITOR) 80 MG tablet Take 1 tablet (80 mg total) by mouth daily. 12/19/19 03/18/20  Marrianne Mood D, PA-C  benazepril (LOTENSIN) 40 MG tablet Take 1 tablet by mouth once daily 11/25/19   Loel Dubonnet, NP  Cinnamon 500 MG TABS Take 1,000 mg by mouth daily.    [provider]  cloNIDine (CATAPRES) 0.2 MG tablet  Take 1 tablet by mouth 4 times daily 01/28/20   Parks Ranger, Devonne Doughty, DO  co-enzyme Q-10 30 MG capsule Take 30 mg by mouth 3 (three) times daily.    [provider]  felodipine (PLENDIL) 10 MG 24 hr tablet Take 1 tablet (10 mg total) by mouth daily. 03/12/19   Minna Merritts, MD  gemfibrozil (LOPID) 600 MG tablet TAKE 1 TABLET BY MOUTH TWICE DAILY BEFORE A MEAL 07/30/19   Karamalegos, Devonne Doughty, DO  glimepiride (AMARYL) 4 MG tablet Take 1 tablet (4 mg total) by mouth daily with breakfast. 03/26/20   Karamalegos, Devonne Doughty, DO  hydrALAZINE (APRESOLINE) 100 MG tablet TAKE 1 TABLET BY MOUTH IN THE MORNING, AT NOON, IN THE EVENING, AND AT BEDTIME 02/25/20   Loel Dubonnet, NP  ibuprofen (ADVIL,MOTRIN) 200 MG tablet Take 800 mg by mouth every 6 (six) hours as needed.    [provider]  isosorbide mononitrate (IMDUR) 60 MG 24 hr tablet Take 1 tablet by mouth twice daily 02/24/20   Loel Dubonnet, NP  labetalol (NORMODYNE) 100 MG tablet Take 1 tablet (100 mg total) by mouth 2 (two) times daily. Take 1 tablet (100 mg total) by mouth 2 (two) times daily. May take an additional half tablet as needed for systolic blood pressure [id number]. 05/09/19   Loel Dubonnet, NP  metFORMIN (GLUCOPHAGE) 1000 MG tablet TAKE 1 TABLET BY MOUTH TWICE DAILY WITH A MEAL 01/28/20   Karamalegos, Devonne Doughty, DO  Multiple Vitamin (MULTIVITAMIN) tablet Take 1 tablet by mouth daily.    [provider]  nitroGLYCERIN (NITROSTAT)  0.4 MG SL tablet DISSOLVE ONE TABLET UNDER THE TONGUE EVERY 5 MINUTES AS NEEDED FOR CHEST PAIN.  DO NOT EXCEED A TOTAL OF 3 DOSES IN 15 MINUTES 12/31/19   Gollan, Kathlene November, MD  Omega-3 Fatty Acids (OMEGA-3 FISH OIL) 1200 MG CAPS Take 2,000 mg by mouth 2 (two) times daily with a meal.    [provider]  torsemide (DEMADEX) 20 MG tablet Take 1 tablet (20 mg total) by mouth every other day. 10/10/19 01/08/20  Marrianne Mood D, PA-C  Turmeric 500 MG TABS Take by mouth  daily.     [provider]     Vital Signs: BP 118/66   Pulse 95   Temp 100.04 F (37.8 C)   Resp (!) 25   Ht '6\' 1"'$  (1.854 m)   Wt 249 lb 1.9 oz (113 kg)   SpO2 99%   BMI 32.87 kg/m   Physical Exam Vitals and nursing note reviewed.  Constitutional:      General: He is not in acute distress.    Comments: Intubated with sedation.  Pulmonary:     Effort: Pulmonary effort is normal. No respiratory distress.     Comments: Intubated with sedation. Skin:    General: Skin is warm and dry.     Comments: Right femoral puncture site soft without active bleeding or hematoma.  Neurological:     Comments: Intubated with sedation. He responds to voice (follows simple commands to voice), eyes remain closed. PERRL bilaterally. Can wiggle bilateral toes and grip hand with RUE, no movements of LUE. Distal pulses (DPs) 1+ bilaterally.     Imaging: DG Chest 1 View  Result Date: 05/01/2020 CLINICAL DATA:  Acute shortness of breath.  CVA. EXAM: CHEST  1 VIEW COMPARISON:  07/08/2019 chest radiograph FINDINGS: This is a low volume study. The cardiomediastinal silhouette is unchanged. Mild pulmonary vascular congestion noted. There is no evidence of focal airspace disease, pulmonary edema, suspicious pulmonary nodule/mass, pleural effusion, or pneumothorax. No acute bony abnormalities are identified. IMPRESSION: Mild pulmonary vascular congestion. Electronically Signed   By: Margarette Canada M.D.   On: 05/01/2020 15:02   MR ANGIO HEAD WO CONTRAST  Result Date: 04/26/2020 CLINICAL DATA:  Stroke follow-up. EXAM: MRI HEAD WITHOUT CONTRAST MRA HEAD WITHOUT CONTRAST TECHNIQUE: Multiplanar, multiecho pulse sequences of the brain and surrounding structures were obtained without intravenous contrast. Angiographic images of the head were obtained using MRA technique without contrast. COMPARISON:  CT a of the head neck from yesterday FINDINGS: MRI HEAD FINDINGS Brain: Patchy cortical infarction along the  right MCA territory and reaching the right occipital cortex in this patient with large right posterior communicating artery. Two tiny cortical infarcts in the left MCA to distal ACA territory. No hemorrhage, mass effect, or hydrocephalus. Age normal brain volume. Mild pre-existing ischemic disease for age. Vascular: Arterial findings below. Skull and upper cervical spine: Normal marrow signal Sinuses/Orbits: Generalized mucosal thickening with fluid levels. MRA HEAD FINDINGS Right M2 stenting with expected associated artifact but visible internal flow on source images. On 3D reformats there is apparent high-grade stenosis with flow gap in the region of the M2 takeoff, but this is likely primarily from artifact based on source images. There is moderate atheromatous irregularity of bilateral MCA branches. Diffuse atheromatous irregularity of the left V4 segment and basilar with overall moderate stenosis except at the vertebrobasilar junction where narrowing appears high-grade. The right V4 segment is not seen until the patent PICA. IMPRESSION: 1. Scattered acute cortical infarcts in  the right MCA territory and right occipital lobe. Two tiny cortical infarcts in the left cerebral hemisphere. 2. Present flow in the stented right M2 branch. 3. Advanced intracranial atherosclerosis as previously characterized. 4. Sinus inflammation. Electronically Signed   By: Monte Fantasia M.D.   On: 04/26/2020 04:34   MR BRAIN WO CONTRAST  Result Date: 04/26/2020 CLINICAL DATA:  Stroke follow-up. EXAM: MRI HEAD WITHOUT CONTRAST MRA HEAD WITHOUT CONTRAST TECHNIQUE: Multiplanar, multiecho pulse sequences of the brain and surrounding structures were obtained without intravenous contrast. Angiographic images of the head were obtained using MRA technique without contrast. COMPARISON:  CT a of the head neck from yesterday FINDINGS: MRI HEAD FINDINGS Brain: Patchy cortical infarction along the right MCA territory and reaching the right  occipital cortex in this patient with large right posterior communicating artery. Two tiny cortical infarcts in the left MCA to distal ACA territory. No hemorrhage, mass effect, or hydrocephalus. Age normal brain volume. Mild pre-existing ischemic disease for age. Vascular: Arterial findings below. Skull and upper cervical spine: Normal marrow signal Sinuses/Orbits: Generalized mucosal thickening with fluid levels. MRA HEAD FINDINGS Right M2 stenting with expected associated artifact but visible internal flow on source images. On 3D reformats there is apparent high-grade stenosis with flow gap in the region of the M2 takeoff, but this is likely primarily from artifact based on source images. There is moderate atheromatous irregularity of bilateral MCA branches. Diffuse atheromatous irregularity of the left V4 segment and basilar with overall moderate stenosis except at the vertebrobasilar junction where narrowing appears high-grade. The right V4 segment is not seen until the patent PICA. IMPRESSION: 1. Scattered acute cortical infarcts in the right MCA territory and right occipital lobe. Two tiny cortical infarcts in the left cerebral hemisphere. 2. Present flow in the stented right M2 branch. 3. Advanced intracranial atherosclerosis as previously characterized. 4. Sinus inflammation. Electronically Signed   By: Monte Fantasia M.D.   On: 04/26/2020 04:34   CT CEREBRAL PERFUSION W CONTRAST  Result Date: 04/13/2020 CLINICAL DATA:  Slurred speech and left-sided weakness. EXAM: CT ANGIOGRAPHY HEAD AND NECK CT PERFUSION BRAIN TECHNIQUE: Multidetector CT imaging of the head and neck was performed using the standard protocol during bolus administration of intravenous contrast. Multiplanar CT image reconstructions and MIPs were obtained to evaluate the vascular anatomy. Carotid stenosis measurements (when applicable) are obtained utilizing NASCET criteria, using the distal internal carotid diameter as the denominator.  Multiphase CT imaging of the brain was performed following IV bolus contrast injection. Subsequent parametric perfusion maps were calculated using RAPID software. CONTRAST:  85m OMNIPAQUE IOHEXOL 350 MG/ML SOLN COMPARISON:  None. FINDINGS: CT HEAD FINDINGS Brain: There is no evidence of an acute infarct, intracranial hemorrhage, mass, midline shift, or extra-axial fluid collection. Mild cerebral atrophy is within normal limits for age. Patchy hypodensities in the cerebral white matter bilaterally are nonspecific but compatible with mild chronic small vessel ischemic disease. There is a chronic lacunar infarct at the inferior aspect of the right caudate head. Vascular: Calcified atherosclerosis at the skull base. No hyperdense vessel. Skull: No fracture or suspicious osseous lesion. Sinuses/Orbits: Mild scattered mucosal thickening in the paranasal sinuses. Clear mastoid air cells. Bilateral cataract extraction. Other: None. ASPECTS ([place]roke Program Early CT Score) - Ganglionic level infarction (caudate, lentiform nuclei, internal capsule, insula, M1-M3 cortex): 7 - Supraganglionic infarction (M4-M6 cortex): 3 Total score (0-10 with 10 being normal): 10 Review of the MIP images confirms the above findings CTA NECK FINDINGS Aortic arch: Normal variant aortic  arch branching pattern with common origin of the brachiocephalic and left common carotid arteries. Mild calcified plaque in the aortic arch without evidence of a significant arch vessel origin stenosis. Right carotid system: Patent with mild scattered predominantly calcified plaque throughout the common carotid artery and at the carotid bifurcation. No evidence of a significant stenosis or dissection. Left carotid system: Patent with mild plaque in the mid common carotid artery not resulting in significant stenosis. More extensive, heavily calcified plaque at the carotid bifurcation results in less than 50% proximal ICA stenosis. No evidence of dissection.  Vertebral arteries: There is either severe stenosis or short segment occlusion of the proximal left vertebral artery at its origin with the vessel being patent throughout the remainder of the neck. There is a diffusely thready appearance of the right vertebral artery V1 and V2 segments with the right V3 segment being occluded. Skeleton: Advanced cervical disc and facet degeneration. Other neck: No evidence of cervical lymphadenopathy or mass. Upper chest: Partially visualized small right pleural effusion. No apical lung consolidation. Review of the MIP images confirms the above findings CTA HEAD FINDINGS Anterior circulation: The internal carotid arteries are patent from skull base to carotid termini with calcified plaque resulting in mild cavernous and proximal supraclinoid stenosis bilaterally. The right M1 segment is patent with a mild stenosis proximally. There is a decreased number of distal right MCA branch vessels compared to the left, however a discrete proximal branch occlusion is not identified. The left MCA is patent with branch vessel irregularity as well as a mild-to-moderate proximal M2 stenosis. Both ACAs are patent without evidence of a significant proximal stenosis. No aneurysm is identified. Posterior circulation: The intracranial left vertebral artery is patent but diffusely irregular and narrowed with multiple moderate to severe stenoses. The right V4 segment is occluded proximally with likely retrograde filling distally supplying the right PICA. 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 Patent SCAs are seen bilaterally. There is a fetal origin of the PCAs without evidence of a significant proximal PCA stenosis. No aneurysm is identified. Venous sinuses: Patent. Anatomic variants: Fetal origin of the PCAs. Review of the MIP images confirms the above findings CT Brain Perfusion  Findings: ASPECTS: 10 CBF (<30%) Volume: 0 mL Perfusion (Tmax>6.0s) volume: 55 mL Mismatch Volume: 55 mL Infarction Location: No core infarct evident by CTP. Ischemic penumbra in the right MCA territory, greatest in the frontal lobe at and superior to the operculum though with parietal and temporal lobe involvement as well. IMPRESSION: CT HEAD: 1. No evidence of acute intracranial abnormality.  ASPECTS of 10. 2. Mild chronic small vessel ischemic disease. CTA HEAD AND NECK: 1. Decreased number of right MCA branch vessels without a discrete proximal occlusion identified. It is possible that this reflects a stump occlusion of a proximal to mid M2 branch which is not readily apparent by CTA. 2. Intracranial atherosclerosis with multiple anterior and posterior circulation stenoses as detailed above. 3. 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 4. Severe stenosis or short segment occlusion of the left vertebral artery at its origin with patency of the vessel more distally. Heavily diseased left V4 segment. 5. Cervical carotid atherosclerosis without significant stenosis. 6.  Aortic Atherosclerosis (ICD10-I70.0). CT BRAIN PERFUSION: 55 mL of ischemic penumbra in the right MCA territory without evidence of a core infarct. These results were called by telephone at the time of interpretation on 05/04/2020 at 2:15 pm to  Dr. Donnetta Simpers, who verbally acknowledged these results. Electronically Signed   By: Logan Bores M.D.   On: 04/10/2020 14:58   DG CHEST PORT 1 VIEW  Result Date: 04/26/2020 CLINICAL DATA:  Hypoxia EXAM: PORTABLE CHEST 1 VIEW COMPARISON:  04/24/2020 FINDINGS: The endotracheal tube terminates above the carina. There is elevation of the right hemidiaphragm as before. There are persistent but improving perihilar airspace opacities. There is no pneumothorax. No definite acute displaced fracture. No large pleural effusion. IMPRESSION: 1. Improving bilateral perihilar airspace opacities. 2.  Endotracheal tube as above. Electronically Signed   By: Constance Holster M.D.   On: 04/26/2020 15:20   DG CHEST PORT 1 VIEW  Result Date: 05/04/2020 CLINICAL DATA:  Endotracheal placement EXAM: PORTABLE CHEST 1 VIEW COMPARISON:  Earlier same day FINDINGS: Endotracheal tube tip 4 cm above the carina. There is worsened atelectasis in the perihilar regions both sides. Possible pulmonary venous hypertension without Theran edema. No visible effusion. IMPRESSION: Endotracheal tube tip 4 cm above the carina. Worsened perihilar atelectasis. Possible pulmonary venous hypertension. No Kaito edema. No visible effusion or pneumothorax. Electronically Signed   By: Nelson Chimes M.D.   On: 05/04/2020 19:29   DG Abd Portable 1V  Result Date: 04/26/2020 CLINICAL DATA:  OG tube placement. EXAM: PORTABLE ABDOMEN - 1 VIEW COMPARISON:  None. FINDINGS: An enteric tube is identified with tip overlying the proximal-mid stomach. IMPRESSION: Enteric tube with tip overlying the proximal-mid stomach. Electronically Signed   By: Margarette Canada M.D.   On: 04/26/2020 09:28   CT ANGIO HEAD CODE STROKE  Result Date: 04/24/2020 CLINICAL DATA:  Slurred speech and left-sided weakness. EXAM: CT ANGIOGRAPHY HEAD AND NECK CT PERFUSION BRAIN TECHNIQUE: Multidetector CT imaging of the head and neck was performed using the standard protocol during bolus administration of intravenous contrast. Multiplanar CT image reconstructions and MIPs were obtained to evaluate the vascular anatomy. Carotid stenosis measurements (when applicable) are obtained utilizing NASCET criteria, using the distal internal carotid diameter as the denominator. Multiphase CT imaging of the brain was performed following IV bolus contrast injection. Subsequent parametric perfusion maps were calculated using RAPID software. CONTRAST:  8m OMNIPAQUE IOHEXOL 350 MG/ML SOLN COMPARISON:  None. FINDINGS: CT HEAD FINDINGS Brain: There is no evidence of an acute infarct, intracranial  hemorrhage, mass, midline shift, or extra-axial fluid collection. Mild cerebral atrophy is within normal limits for age. Patchy hypodensities in the cerebral white matter bilaterally are nonspecific but compatible with mild chronic small vessel ischemic disease. There is a chronic lacunar infarct at the inferior aspect of the right caudate head. Vascular: Calcified atherosclerosis at the skull base. No hyperdense vessel. Skull: No fracture or suspicious osseous lesion. Sinuses/Orbits: Mild scattered mucosal thickening in the paranasal sinuses. Clear mastoid air cells. Bilateral cataract extraction. Other: None. ASPECTS ([place] Early CT Score) - Ganglionic level infarction (caudate, lentiform nuclei, internal capsule, insula, M1-M3 cortex): 7 - Supraganglionic infarction (M4-M6 cortex): 3 Total score (0-10 with 10 being normal): 10 Review of the MIP images confirms the above findings CTA NECK FINDINGS Aortic arch: Normal variant aortic arch branching pattern with common origin of the brachiocephalic and left common carotid arteries. Mild calcified plaque in the aortic arch without evidence of a significant arch vessel origin stenosis. Right carotid system: Patent with mild scattered predominantly calcified plaque throughout the common carotid artery and at the carotid bifurcation. No evidence of a significant stenosis or dissection. Left carotid system: Patent with mild plaque in the mid common carotid artery  not resulting in significant stenosis. More extensive, heavily calcified plaque at the carotid bifurcation results in less than 50% proximal ICA stenosis. No evidence of dissection. Vertebral arteries: There is either severe stenosis or short segment occlusion of the proximal left vertebral artery at its origin with the vessel being patent throughout the remainder of the neck. There is a diffusely thready appearance of the right vertebral artery V1 and V2 segments with the right V3 segment being  occluded. Skeleton: Advanced cervical disc and facet degeneration. Other neck: No evidence of cervical lymphadenopathy or mass. Upper chest: Partially visualized small right pleural effusion. No apical lung consolidation. Review of the MIP images confirms the above findings CTA HEAD FINDINGS Anterior circulation: The internal carotid arteries are patent from skull base to carotid termini with calcified plaque resulting in mild cavernous and proximal supraclinoid stenosis bilaterally. The right M1 segment is patent with a mild stenosis proximally. There is a decreased number of distal right MCA branch vessels compared to the left, however a discrete proximal branch occlusion is not identified. The left MCA is patent with branch vessel irregularity as well as a mild-to-moderate proximal M2 stenosis. Both ACAs are patent without evidence of a significant proximal stenosis. No aneurysm is identified. Posterior circulation: The intracranial left vertebral artery is patent but diffusely irregular and narrowed with multiple moderate to severe stenoses. The right V4 segment is occluded proximally with likely retrograde filling distally supplying the right PICA. 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 Patent SCAs are seen bilaterally. There is a fetal origin of the PCAs without evidence of a significant proximal PCA stenosis. No aneurysm is identified. Venous sinuses: Patent. Anatomic variants: Fetal origin of the PCAs. Review of the MIP images confirms the above findings CT Brain Perfusion Findings: ASPECTS: 10 CBF (<30%) Volume: 0 mL Perfusion (Tmax>6.0s) volume: 55 mL Mismatch Volume: 55 mL Infarction Location: No core infarct evident by CTP. Ischemic penumbra in the right MCA territory, greatest in the frontal lobe at and superior to the operculum though with parietal and temporal lobe involvement as  well. IMPRESSION: CT HEAD: 1. No evidence of acute intracranial abnormality.  ASPECTS of 10. 2. Mild chronic small vessel ischemic disease. CTA HEAD AND NECK: 1. Decreased number of right MCA branch vessels without a discrete proximal occlusion identified. It is possible that this reflects a stump occlusion of a proximal to mid M2 branch which is not readily apparent by CTA. 2. Intracranial atherosclerosis with multiple anterior and posterior circulation stenoses as detailed above. 3. 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 4. Severe stenosis or short segment occlusion of the left vertebral artery at its origin with patency of the vessel more distally. Heavily diseased left V4 segment. 5. Cervical carotid atherosclerosis without significant stenosis. 6.  Aortic Atherosclerosis (ICD10-I70.0). CT BRAIN PERFUSION: 55 mL of ischemic penumbra in the right MCA territory without evidence of a core infarct. These results were called by telephone at the time of interpretation on 04/20/2020 at 2:15 pm to Dr. Donnetta Simpers, who verbally acknowledged these results. Electronically Signed   By: Logan Bores M.D.   On: 04/11/2020 14:58   CT ANGIO NECK CODE STROKE  Result Date: 05/04/2020 CLINICAL DATA:  Slurred speech and left-sided weakness. EXAM: CT ANGIOGRAPHY HEAD AND NECK CT PERFUSION BRAIN TECHNIQUE: Multidetector CT imaging of the head and neck was performed using the standard protocol during bolus administration of intravenous contrast.  Multiplanar CT image reconstructions and MIPs were obtained to evaluate the vascular anatomy. Carotid stenosis measurements (when applicable) are obtained utilizing NASCET criteria, using the distal internal carotid diameter as the denominator. Multiphase CT imaging of the brain was performed following IV bolus contrast injection. Subsequent parametric perfusion maps were calculated using RAPID software. CONTRAST:  77m OMNIPAQUE IOHEXOL 350 MG/ML SOLN COMPARISON:  None.  FINDINGS: CT HEAD FINDINGS Brain: There is no evidence of an acute infarct, intracranial hemorrhage, mass, midline shift, or extra-axial fluid collection. Mild cerebral atrophy is within normal limits for age. Patchy hypodensities in the cerebral white matter bilaterally are nonspecific but compatible with mild chronic small vessel ischemic disease. There is a chronic lacunar infarct at the inferior aspect of the right caudate head. Vascular: Calcified atherosclerosis at the skull base. No hyperdense vessel. Skull: No fracture or suspicious osseous lesion. Sinuses/Orbits: Mild scattered mucosal thickening in the paranasal sinuses. Clear mastoid air cells. Bilateral cataract extraction. Other: None. ASPECTS ([place] Early CT Score) - Ganglionic level infarction (caudate, lentiform nuclei, internal capsule, insula, M1-M3 cortex): 7 - Supraganglionic infarction (M4-M6 cortex): 3 Total score (0-10 with 10 being normal): 10 Review of the MIP images confirms the above findings CTA NECK FINDINGS Aortic arch: Normal variant aortic arch branching pattern with common origin of the brachiocephalic and left common carotid arteries. Mild calcified plaque in the aortic arch without evidence of a significant arch vessel origin stenosis. Right carotid system: Patent with mild scattered predominantly calcified plaque throughout the common carotid artery and at the carotid bifurcation. No evidence of a significant stenosis or dissection. Left carotid system: Patent with mild plaque in the mid common carotid artery not resulting in significant stenosis. More extensive, heavily calcified plaque at the carotid bifurcation results in less than 50% proximal ICA stenosis. No evidence of dissection. Vertebral arteries: There is either severe stenosis or short segment occlusion of the proximal left vertebral artery at its origin with the vessel being patent throughout the remainder of the neck. There is a diffusely thready  appearance of the right vertebral artery V1 and V2 segments with the right V3 segment being occluded. Skeleton: Advanced cervical disc and facet degeneration. Other neck: No evidence of cervical lymphadenopathy or mass. Upper chest: Partially visualized small right pleural effusion. No apical lung consolidation. Review of the MIP images confirms the above findings CTA HEAD FINDINGS Anterior circulation: The internal carotid arteries are patent from skull base to carotid termini with calcified plaque resulting in mild cavernous and proximal supraclinoid stenosis bilaterally. The right M1 segment is patent with a mild stenosis proximally. There is a decreased number of distal right MCA branch vessels compared to the left, however a discrete proximal branch occlusion is not identified. The left MCA is patent with branch vessel irregularity as well as a mild-to-moderate proximal M2 stenosis. Both ACAs are patent without evidence of a significant proximal stenosis. No aneurysm is identified. Posterior circulation: The intracranial left vertebral artery is patent but diffusely irregular and narrowed with multiple moderate to severe stenoses. The right V4 segment is occluded proximally with likely retrograde filling distally supplying the right PICA. 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 Patent SCAs are seen bilaterally. There is a fetal origin of the PCAs without evidence of a significant proximal PCA stenosis. No aneurysm is identified. Venous sinuses: Patent. Anatomic variants: Fetal origin of the PCAs. Review of the MIP images confirms  the above findings CT Brain Perfusion Findings: ASPECTS: 10 CBF (<30%) Volume: 0 mL Perfusion (Tmax>6.0s) volume: 55 mL Mismatch Volume: 55 mL Infarction Location: No core infarct evident by CTP. Ischemic penumbra in the right MCA territory, greatest in the frontal  lobe at and superior to the operculum though with parietal and temporal lobe involvement as well. IMPRESSION: CT HEAD: 1. No evidence of acute intracranial abnormality.  ASPECTS of 10. 2. Mild chronic small vessel ischemic disease. CTA HEAD AND NECK: 1. Decreased number of right MCA branch vessels without a discrete proximal occlusion identified. It is possible that this reflects a stump occlusion of a proximal to mid M2 branch which is not readily apparent by CTA. 2. Intracranial atherosclerosis with multiple anterior and posterior circulation stenoses as detailed above. 3. 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 4. Severe stenosis or short segment occlusion of the left vertebral artery at its origin with patency of the vessel more distally. Heavily diseased left V4 segment. 5. Cervical carotid atherosclerosis without significant stenosis. 6.  Aortic Atherosclerosis (ICD10-I70.0). CT BRAIN PERFUSION: 55 mL of ischemic penumbra in the right MCA territory without evidence of a core infarct. These results were called by telephone at the time of interpretation on 04/12/2020 at 2:15 pm to Dr. Donnetta Simpers, who verbally acknowledged these results. Electronically Signed   By: Logan Bores M.D.   On: 05/01/2020 14:58    Labs:  CBC: Recent Labs    04/16/2020 1417 04/29/2020 1609 04/15/2020 2010 04/10/2020 2043 04/26/20 0423 04/27/20 0444  WBC 16.8*  --  16.1*  --  12.2* 12.2*  HGB 8.1*   < > 7.9* 7.5* 8.6* 8.6*  HCT 25.0*   < > 25.3* 22.0* 27.5* 27.7*  PLT 251  --  226  --  264 217   < > = values in this interval not displayed.    COAGS: Recent Labs    04/22/2020 1417 04/26/20 0206  INR 2.2* 2.4*  APTT 55*  --     BMP: Recent Labs    10/09/19 1029 10/15/19 1159 10/28/19 1017 12/04/19 1147 05/04/2020 1417 04/17/2020 2010 04/29/2020 2043 04/26/20 0423 04/26/20 1735 04/27/20 0444  NA 141 146* 142 139   < > 142 141 141 139 140  K 4.6 5.1 4.3 3.9   < > 5.7* 5.5* 4.9 5.0 5.4*  CL 107 105  106 104   < > 109  --  109 108 108  CO2 '22 20 22 23   '$ < > 19*  --  17* 16* 16*  GLUCOSE 176* 163* 170* 192*   < > 89  --  42* 126* 126*  BUN 36* 29* 39* 20   < > 81*  --  85* 90* 101*  CALCIUM 9.3 9.9 9.2 9.6   < > 9.9  --  8.5* 8.1* 8.0*  CREATININE 1.42* 1.41* 1.39* 1.23   < > 3.17*  --  3.35* 3.71* 3.93*  GFRNONAA 46* 47* 48* 55*   < > 19*  --  18* 16* 15*  GFRAA 54* 54* 55* >60  --   --   --   --   --   --    < > = values in this interval not displayed.    LIVER FUNCTION TESTS: Recent Labs    07/16/19 0751 10/01/19 1251 12/04/19 1147 04/14/2020 1417  BILITOT 0.4 0.3 0.7 0.7  AST '18 16 20 '$ 37  ALT '18 15 19 28  '$ ALKPHOS  --  36* 31*  66  PROT 7.2 6.9 7.5 6.1*  ALBUMIN  --  4.8* 4.3 2.4*    Assessment and Plan:  History of acute CVA s/p cerebral arteriogram with emergent mechanical thrombectomy and stent placement of right MCA M2 occlusion achieving a TICI 3 revascularization via right femoral approach 04/29/2020 by Dr. Karenann Cai. Patient's condition stable- remains intubated/sedated, responds to voice and follows simple commands, can wiggle bilateral toes and grip hand with RUE, no movements of LUE. Right femoral puncture site stable, distal pulses (DPs) 1+ bilaterally. Continue taking Brilinta 90 mg twice daily and Aspirin 81 mg once daily. Plan to follow-up with Dr. Karenann Cai in clinic 4 weeks after discharge ([facility] schedulers to call patient to set up this appointment). Further plans per neurology/CCM- appreciate and agree with management. Please call [facility] with questions/concerns.   Electronically Signed: Earley Abide, PA-C 04/27/2020, 9:38 AM   I spent a total of 15 Minutes at the the patient's bedside AND on the patient's hospital floor or unit, greater than 50% of which was counseling/coordinating care for CVA s/p revascularization.

## 2020-04-27 NOTE — Progress Notes (Signed)
OT Cancellation Note  Patient Details Name: Robert Daniel MRN: [id number] DOB: 21-Jan-1941   Cancelled Treatment:    Reason Eval/Treat Not Completed: Other (comment) (Intubated and sedated. Will return as schedule allows.)   [place], OTR/L Acute Rehab Pager: [phone] Office: [phone] 04/27/2020, 9:13 AM

## 2020-04-27 NOTE — Procedures (Signed)
Cortrak  Person Inserting Tube:  Nobuko Gsell, Creola Corn, RD Tube Type:  Cortrak - 43 inches Tube Location:  Right nare Initial Placement:  Stomach Secured by: Bridle Technique Used to Measure Tube Placement:  Documented cm marking at nare/ corner of mouth Cortrak Secured At:  80 cm    Cortrak Tube Team Note:  Consult received to place a Cortrak feeding tube.   No x-ray is required. RN may begin using tube.    If the tube becomes dislodged please keep the tube and contact the Cortrak team at [email or web address] (password [email or web address]) for replacement.  If after hours and replacement cannot be delayed, place a NG tube and confirm placement with an abdominal x-ray.    Larkin Ina, MS, RD, LDN RD pager number and weekend/on-call pager number located in [place].

## 2020-04-27 NOTE — Progress Notes (Signed)
PT Cancellation Note  Patient Details Name: Robert Daniel MRN: [id number] DOB: 1940/08/05   Cancelled Treatment:    Reason Eval/Treat Not Completed: Medical issues which prohibited therapy;Patient not medically ready - pt remains intubated, sedated. PT to check back as medically appropriate for eval.   Stacie Glaze, PT Acute Rehabilitation Services Pager [phone]  Office [phone]    Louis Matte 04/27/2020, 9:11 AM

## 2020-04-27 NOTE — Progress Notes (Signed)
Initial Nutrition Assessment  DOCUMENTATION CODES:   Not applicable  INTERVENTION:   Plan for Cortrak tube as able  Tube Feeding via OG:  Osmolite 1.5 at 55 ml/hr Pro-Source TF 45 mL BID Provides 2060 kcals, 105 g of protein and 1003 mL of free water Meets 100% estimated calorie and protein needs Follow electrolytes and adjust TF as appropriate  Additional kcals from cleviprex   NUTRITION DIAGNOSIS:   Inadequate oral intake related to acute illness as evidenced by NPO status.  GOAL:   Patient will meet greater than or equal to 90% of their needs  MONITOR:   Vent status,TF tolerance,Labs,Weight trends  REASON FOR ASSESSMENT:   Consult,Ventilator Enteral/tube feeding initiation and management  ASSESSMENT:   80 yo male admitted with R MCA stroke, brief asystolic arrest requiring intubation, AKI.  PMH includes HTN, DM, CKD   2/19 Code Stroke, brief asystolic arrest, intubation, mechanical thrombectomy  Patient is currently intubated on ventilator support MV: 17.5 L/min Temp (24hrs), Avg:99.4 F (37.4 C), Min:98.06 F (36.7 C), Max:100.58 F (38.1 C)  Propofol: NONE Cleiprex: 4 ml/hr  OG tube with tip in stomach . Adult TF protocol initiated yesterday with Vital High Protein infusing at 40 ml/hr  Unable to obtain diet and weight history at this time  Worsening AKI, liguric onver 24 hours with BUN >100, mild hyperkalemia, hyperphosphatemia. Noted diuresing today  Labs: potassium 5.4 (H), phosphorus 8.2 (H), BUN 101, Creatinine 3.93 Meds: ss novolog, miralax, lasix  Diet Order:   Diet Order            Diet NPO time specified  Diet effective now                 EDUCATION NEEDS:   Not appropriate for education at this time  Skin:  Skin Assessment: Reviewed RN Assessment  Last BM:  PTA  Height:   Ht Readings from Last 1 Encounters:  04/29/2020 '6\' 1"'$  (1.854 m)    Weight:   Wt Readings from Last 1 Encounters:  04/26/2020 113 kg    BMI:  Body  mass index is 32.87 kg/m.  Estimated Nutritional Needs:   Kcal:  2000-2200 kcals  Protein:  100-115 g  Fluid:  >/= 2 L   Kerman Passey MS, RDN, LDN, CNSC Registered Dietitian III Clinical Nutrition RD Pager and On-Call Pager Number Located in [place]

## 2020-04-27 NOTE — Progress Notes (Signed)
Urine output 130 ml all shift. CCM notified, RN awaiting orders

## 2020-04-27 NOTE — Progress Notes (Signed)
Unable to maintain goal of SBP 120-140, patient BP extremely labile. Ranging anywhere from SBP 90 to 200 within minutes. Titrating both cleviprex and neo interchangeably all night. Unable to reach and maintain goal. CCM notified via [place]. This RN will continue to monitor

## 2020-04-28 ENCOUNTER — Inpatient Hospital Stay (HOSPITAL_COMMUNITY): Payer: PPO

## 2020-04-28 ENCOUNTER — Telehealth (HOSPITAL_COMMUNITY): Payer: Self-pay

## 2020-04-28 DIAGNOSIS — Z452 Encounter for adjustment and management of vascular access device: Secondary | ICD-10-CM

## 2020-04-28 DIAGNOSIS — N179 Acute kidney failure, unspecified: Secondary | ICD-10-CM | POA: Diagnosis not present

## 2020-04-28 DIAGNOSIS — Z992 Dependence on renal dialysis: Secondary | ICD-10-CM

## 2020-04-28 DIAGNOSIS — Z8673 Personal history of transient ischemic attack (TIA), and cerebral infarction without residual deficits: Secondary | ICD-10-CM

## 2020-04-28 DIAGNOSIS — J9600 Acute respiratory failure, unspecified whether with hypoxia or hypercapnia: Secondary | ICD-10-CM

## 2020-04-28 DIAGNOSIS — I639 Cerebral infarction, unspecified: Secondary | ICD-10-CM | POA: Diagnosis not present

## 2020-04-28 DIAGNOSIS — R0902 Hypoxemia: Secondary | ICD-10-CM | POA: Diagnosis not present

## 2020-04-28 DIAGNOSIS — Z01818 Encounter for other preprocedural examination: Secondary | ICD-10-CM | POA: Diagnosis not present

## 2020-04-28 DIAGNOSIS — J9601 Acute respiratory failure with hypoxia: Secondary | ICD-10-CM | POA: Diagnosis not present

## 2020-04-28 DIAGNOSIS — I63411 Cerebral infarction due to embolism of right middle cerebral artery: Secondary | ICD-10-CM | POA: Diagnosis not present

## 2020-04-28 LAB — URINALYSIS, COMPLETE (UACMP) WITH MICROSCOPIC
Bilirubin Urine: NEGATIVE
Glucose, UA: NEGATIVE mg/dL
Ketones, ur: NEGATIVE mg/dL
Nitrite: NEGATIVE
Protein, ur: NEGATIVE mg/dL
RBC / HPF: >50 RBC/hpf (ref 0–5)
Specific Gravity, Urine: 1.02 (ref 1.005–1.030)
WBC, UA: >50 WBC/hpf (ref 0–5)
pH: 5 (ref 5.0–8.0)

## 2020-04-28 LAB — CBC
HCT: 28 % — ABNORMAL LOW (ref 39.0–52.0)
Hemoglobin: 8.6 g/dL — ABNORMAL LOW (ref 13.0–17.0)
MCH: 28 pg (ref 26.0–34.0)
MCHC: 30.7 g/dL (ref 30.0–36.0)
MCV: 91.2 fL (ref 80.0–100.0)
Platelets: 191 10*3/uL (ref 150–400)
RBC: 3.07 MIL/uL — ABNORMAL LOW (ref 4.22–5.81)
RDW: 15.9 % — ABNORMAL HIGH (ref 11.5–15.5)
WBC: 15.2 10*3/uL — ABNORMAL HIGH (ref 4.0–10.5)
nRBC: 0.3 % — ABNORMAL HIGH (ref 0.0–0.2)

## 2020-04-28 LAB — BASIC METABOLIC PANEL
Anion gap: 17 — ABNORMAL HIGH (ref 5–15)
BUN: 132 mg/dL — ABNORMAL HIGH (ref 8–23)
CO2: 15 mmol/L — ABNORMAL LOW (ref 22–32)
Calcium: 7.8 mg/dL — ABNORMAL LOW (ref 8.9–10.3)
Chloride: 109 mmol/L (ref 98–111)
Creatinine, Ser: 4.38 mg/dL — ABNORMAL HIGH (ref 0.61–1.24)
GFR, Estimated: 13 mL/min — ABNORMAL LOW (ref >60)
Glucose, Bld: 274 mg/dL — ABNORMAL HIGH (ref 70–99)
Potassium: 6.1 mmol/L — ABNORMAL HIGH (ref 3.5–5.1)
Sodium: 141 mmol/L (ref 135–145)

## 2020-04-28 LAB — HEPATITIS B SURFACE ANTIGEN: Hepatitis B Surface Ag: NONREACTIVE

## 2020-04-28 LAB — GLUCOSE, CAPILLARY
Glucose-Capillary: 229 mg/dL — ABNORMAL HIGH (ref 70–99)
Glucose-Capillary: 260 mg/dL — ABNORMAL HIGH (ref 70–99)
Glucose-Capillary: 242 mg/dL — ABNORMAL HIGH (ref 70–99)
Glucose-Capillary: 198 mg/dL — ABNORMAL HIGH (ref 70–99)
Glucose-Capillary: 169 mg/dL — ABNORMAL HIGH (ref 70–99)
Glucose-Capillary: 154 mg/dL — ABNORMAL HIGH (ref 70–99)

## 2020-04-28 LAB — POTASSIUM
Potassium: 6.2 mmol/L — ABNORMAL HIGH (ref 3.5–5.1)
Potassium: 5.8 mmol/L — ABNORMAL HIGH (ref 3.5–5.1)

## 2020-04-28 LAB — CK: Total CK: 124 U/L (ref 49–397)

## 2020-04-28 LAB — PHOSPHORUS: Phosphorus: 9.3 mg/dL — ABNORMAL HIGH (ref 2.5–4.6)

## 2020-04-28 LAB — PROTEIN / CREATININE RATIO, URINE
Creatinine, Urine: 44.96 mg/dL
Protein Creatinine Ratio: 1.22 mg/mg{Cre} — ABNORMAL HIGH (ref 0.00–0.15)
Total Protein, Urine: 55 mg/dL

## 2020-04-28 LAB — MAGNESIUM
Magnesium: 2.3 mg/dL (ref 1.7–2.4)
Magnesium: 2.2 mg/dL (ref 1.7–2.4)

## 2020-04-28 LAB — HEPATITIS B CORE ANTIBODY, TOTAL: Hep B Core Total Ab: NONREACTIVE

## 2020-04-28 IMAGING — DX DG CHEST 1V PORT
1 series · 1 of 1 positions shown · non-contrast
Comparison: Same day chest radiograph at [C0] hours.

CLINICAL DATA: Assess positioning of hemodialysis catheter.

EXAM:
PORTABLE CHEST 1 VIEW

[chest ap]
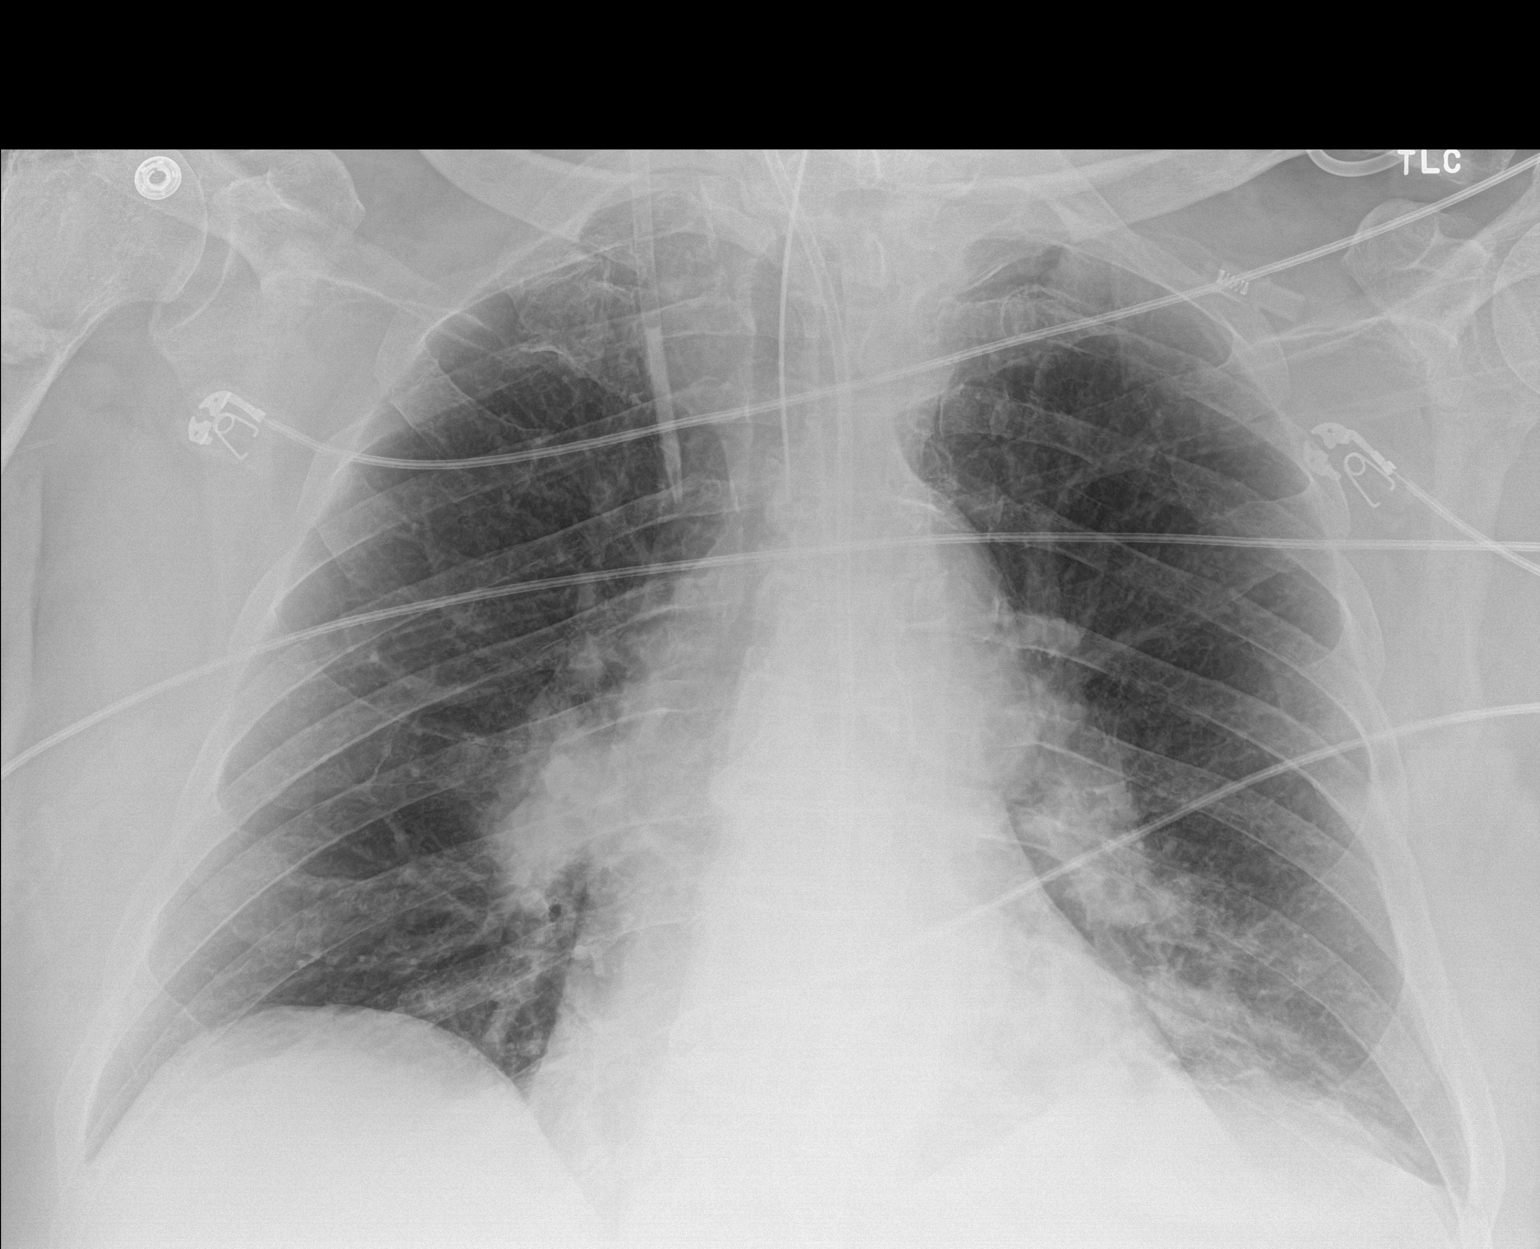

[1 of 1 positions shown; findings below may reference images not displayed]

FINDINGS: Right IJ central venous catheter with tip overlying the superior
vena cava. Endotracheal tube with tip overlying the distal thoracic
trachea. Enteric tube coursing below the diaphragm with tip obscured
by collimation. The heart size and mediastinal contours are
unchanged. Aortic atherosclerosis. Similar bilateral hilar airspace
opacities. No significant pleural effusion or visible pneumothorax.
The visualized skeletal structures are unchanged.
IMPRESSION: 1. Right IJ since venous catheter with tip overlying the superior
vena cava. No visible pneumothorax.

## 2020-04-28 IMAGING — US US RENAL
1 series · 14 of 25 positions shown · non-contrast
Comparison: None.

CLINICAL DATA: Acute kidney injury

EXAM:
RENAL / URINARY TRACT ULTRASOUND COMPLETE

[Series 1: us renal · 47 acquisitions, 14 frames shown]
[im 1/47]
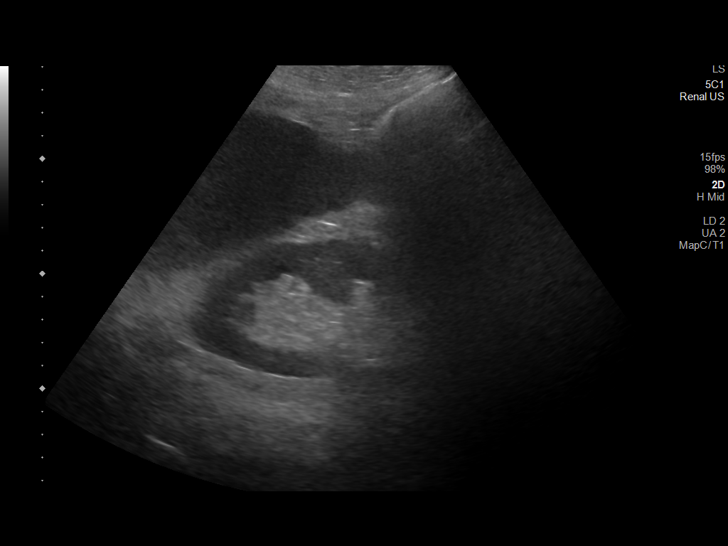
[im 4/47]
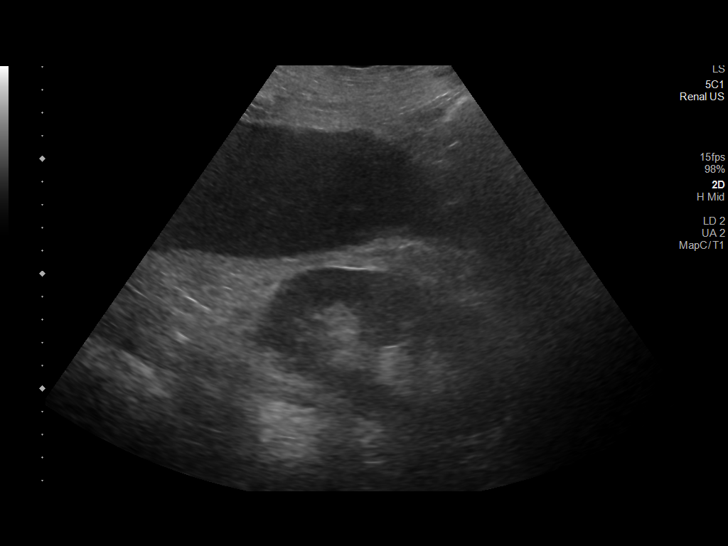
[im 8/47]
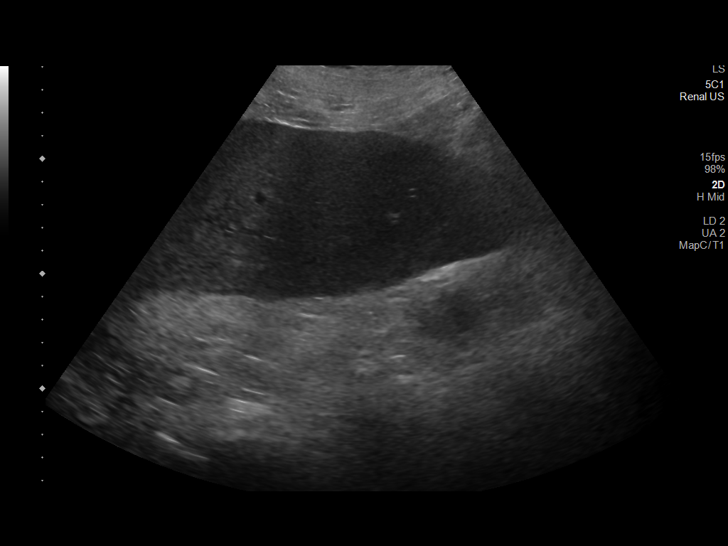
[im 12/47]
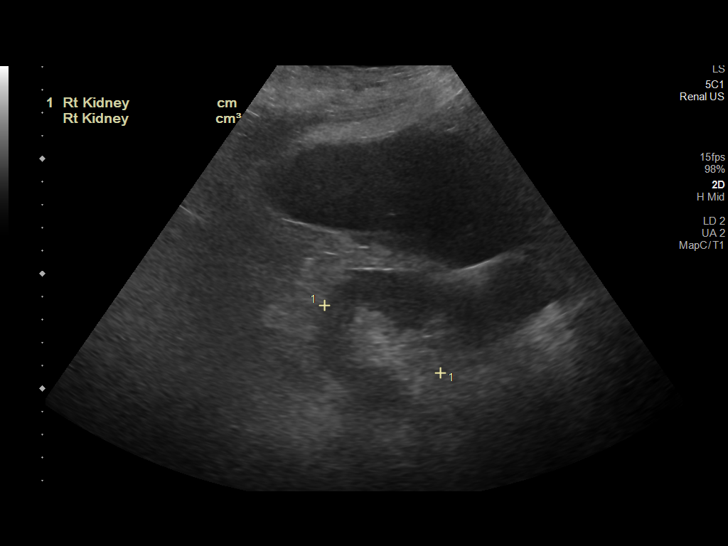
[im 16/47]
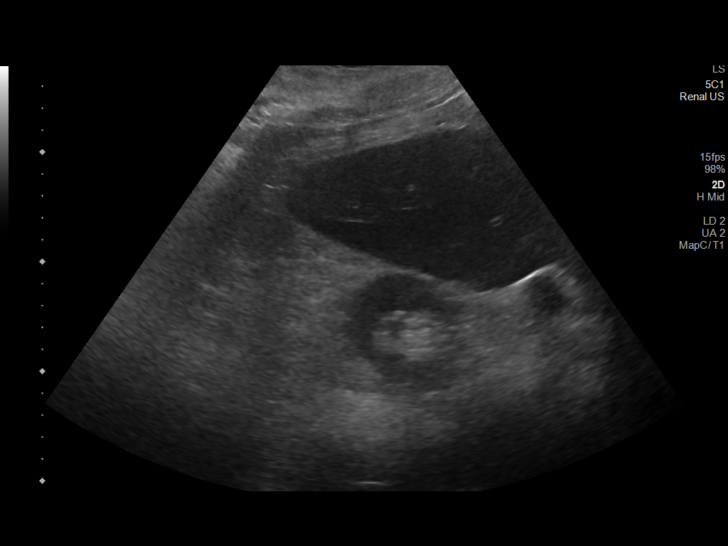
[im 18/47]
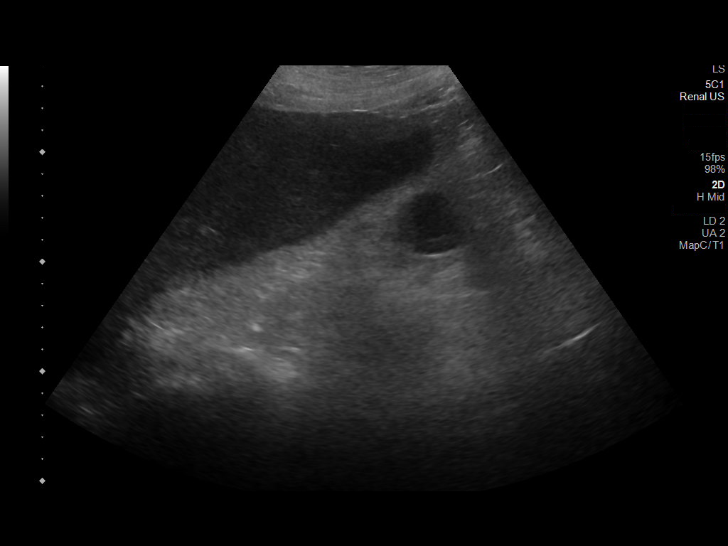
[im 22/47]
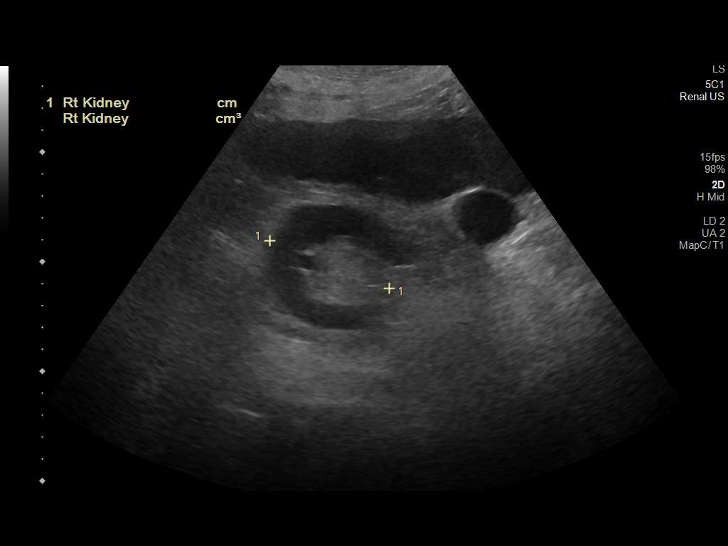
[im 25/47]
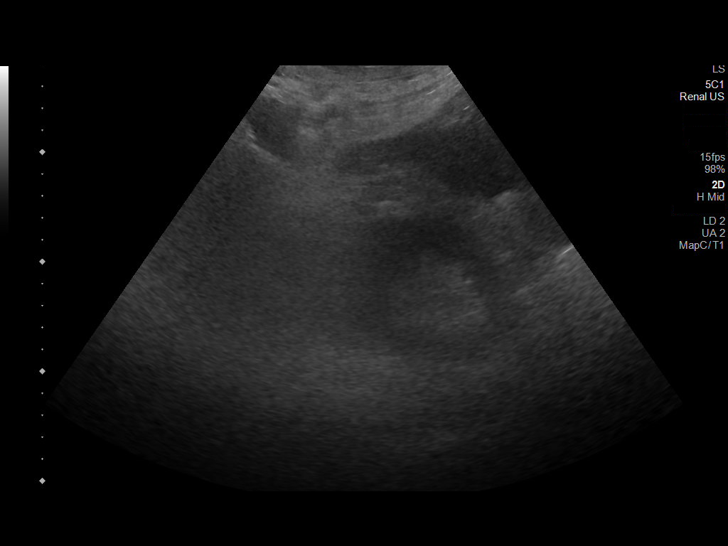
[im 29/47]
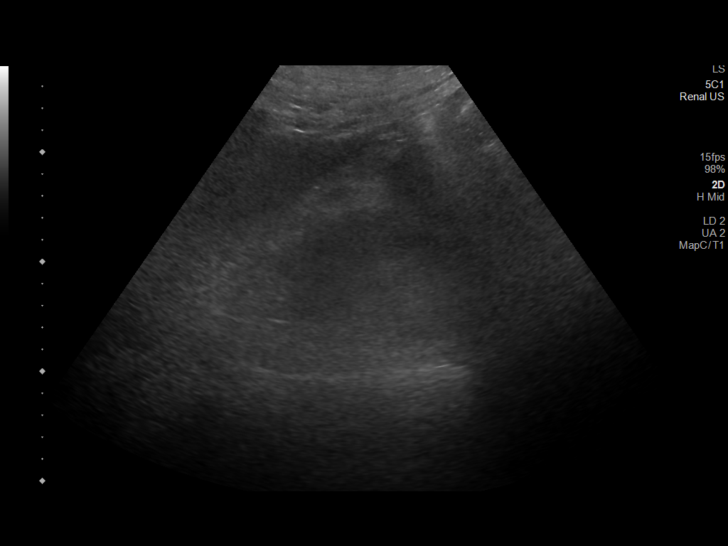
[im 31/47]
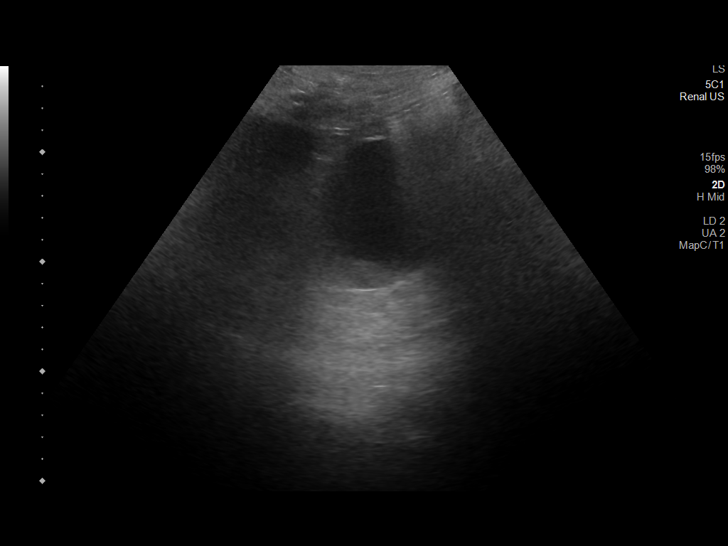
[im 35/47]
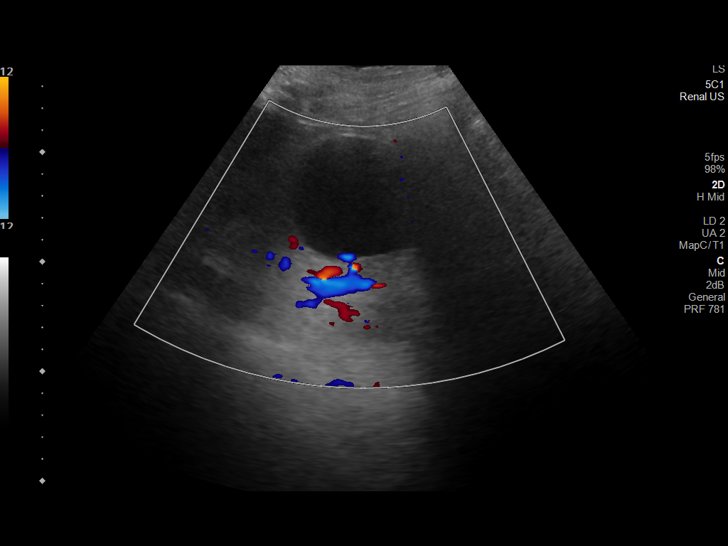
[im 39/47]
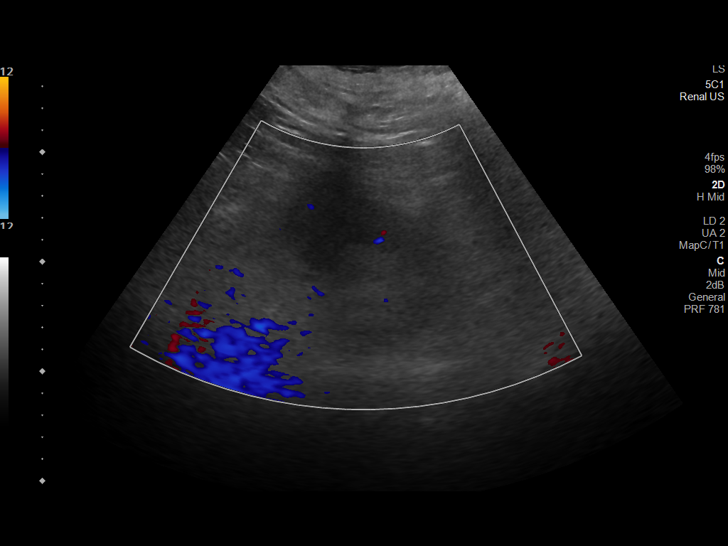
[im 43/47]
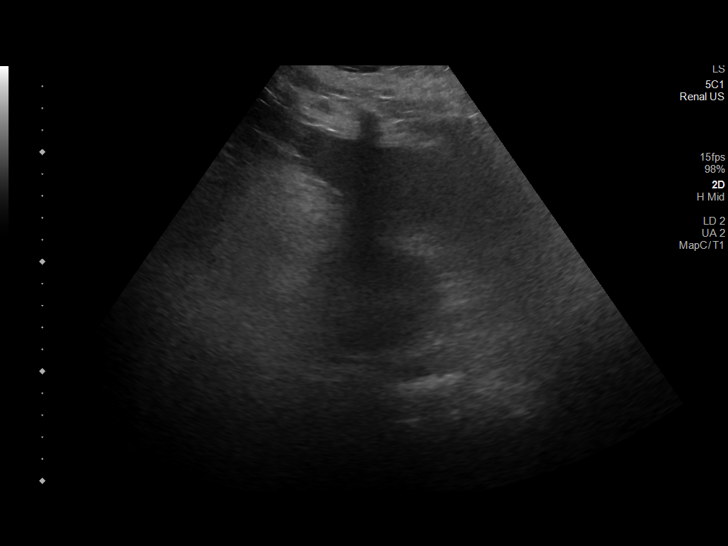
[im 47/47]
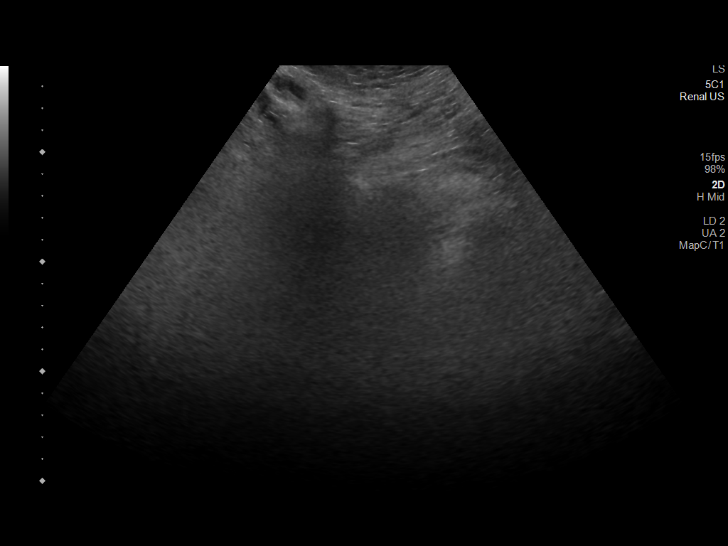

[14 of 25 positions shown; findings below may reference images not displayed]

FINDINGS: Right Kidney:

Renal measurements: 14.4 x 6 x 5.9 cm = volume: 265 mL. Cortical
echogenicity normal. No hydronephrosis. Possible exophytic cyst off
the midpole but poor visibility.

Left Kidney:

Renal measurements: 13.6 x 7.3 x 6.4 cm = volume: 332.5 mL.
Echogenicity within normal limits. No hydronephrosis. Cyst at the
lower pole measuring 6.3 cm.

Bladder:

Not well seen and presumably empty

Other:

None.
IMPRESSION: 1. Negative for hydronephrosis.
2. Simple appearing left renal cyst

## 2020-04-28 IMAGING — DX DG CHEST 1V PORT
1 series · 1 of 1 positions shown · non-contrast
Comparison: [DATE]

CLINICAL DATA: Hypoxia

EXAM:
PORTABLE CHEST 1 VIEW

[chest]
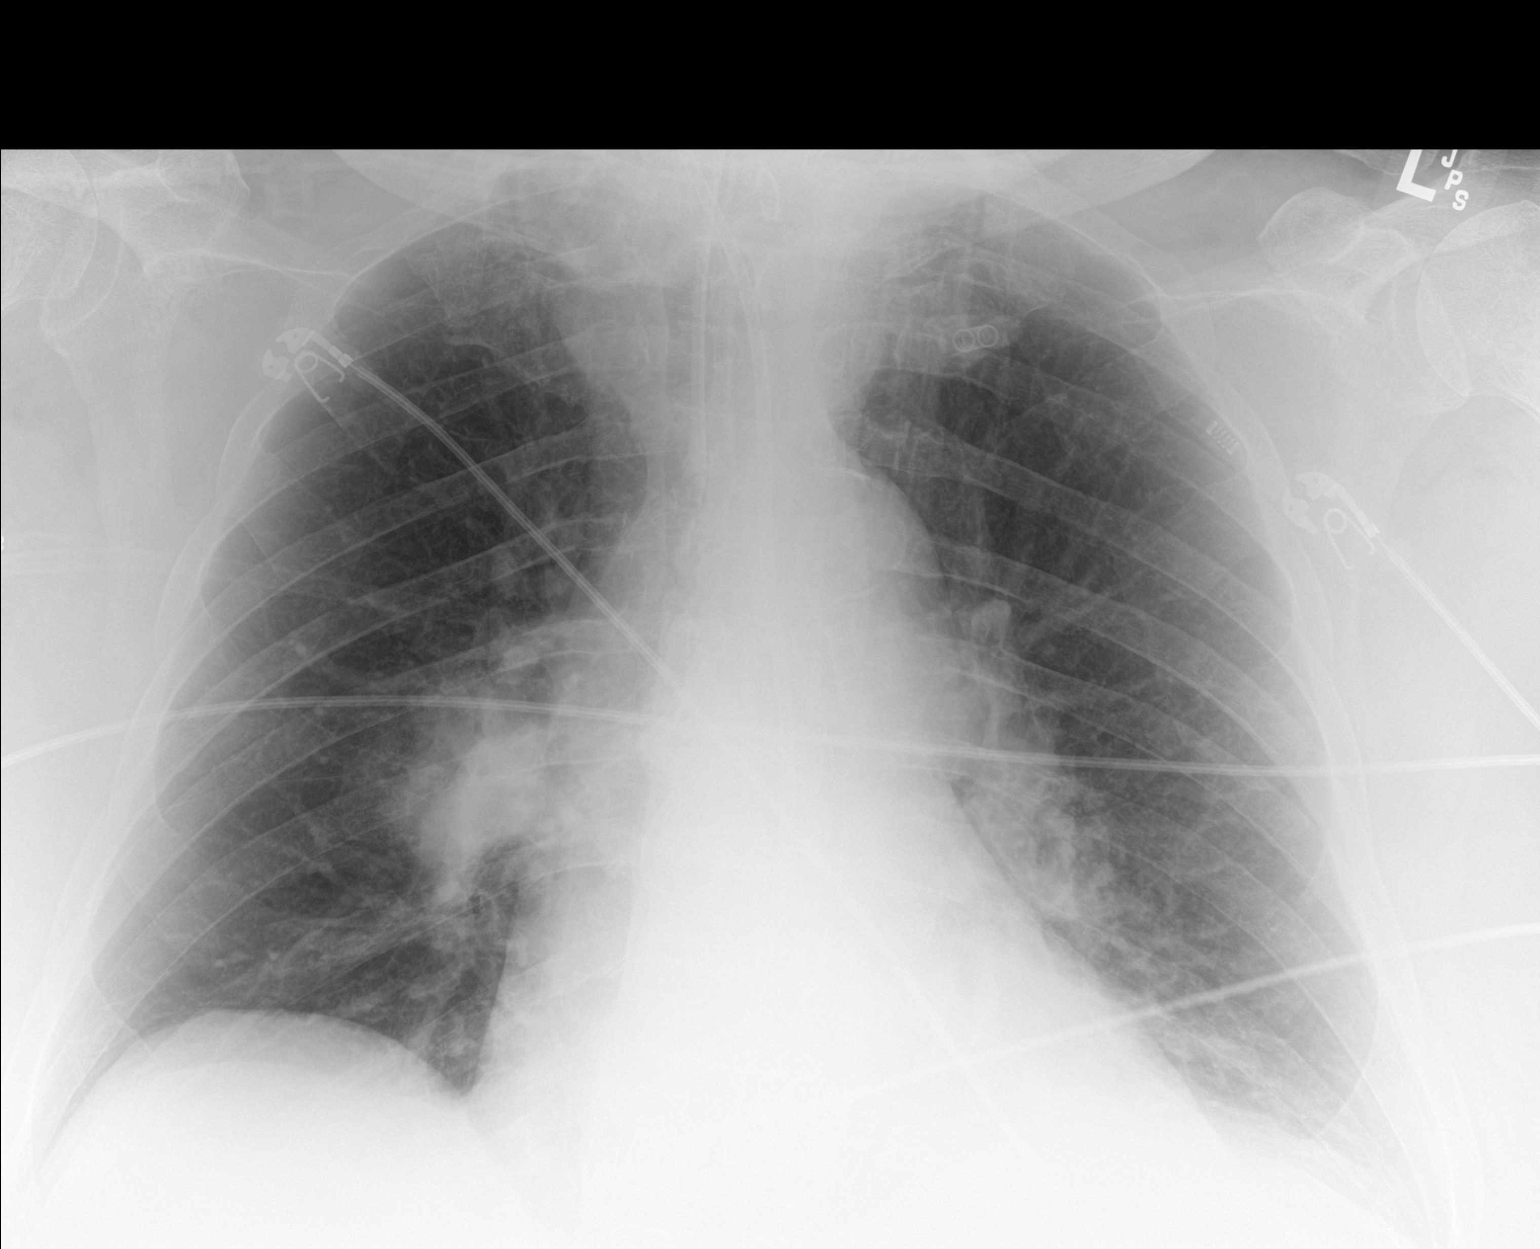

[1 of 1 positions shown; findings below may reference images not displayed]

FINDINGS: Endotracheal tube tip is 2.2 cm above the carina. Enteric tube tip
is below the diaphragm. No pneumothorax. There is airspace opacity
overlying the right hilum, a change from 2 days prior. There is
bibasilar atelectasis. Heart size and pulmonary vascularity are
normal. No adenopathy. No bone lesions. There is aortic
atherosclerosis.
IMPRESSION: Tube positions as described without pneumothorax. Suspect pneumonia
right perihilar region given new opacity in this area compared to 2
days prior. Bibasilar atelectasis. Stable cardiac silhouette. Aortic
Atherosclerosis ([8G]-[8G]).

## 2020-04-28 IMAGING — CT CT HEAD W/O CM
4 series · 15 of 47 positions shown, 17 images · non-contrast
Comparison: MRI [DATE].  CT exams [DATE].

CLINICAL DATA: Altered mental status.

EXAM:
CT HEAD WITHOUT CONTRAST
TECHNIQUE: Contiguous axial images were obtained from the base of the skull
through the vertex without intravenous contrast.

[Series 3: head wo · axial · 0.47mm/px · z∈[-116,+10]mm · 7 of 35 slices shown, 9 images]
[im 5/35  brain]
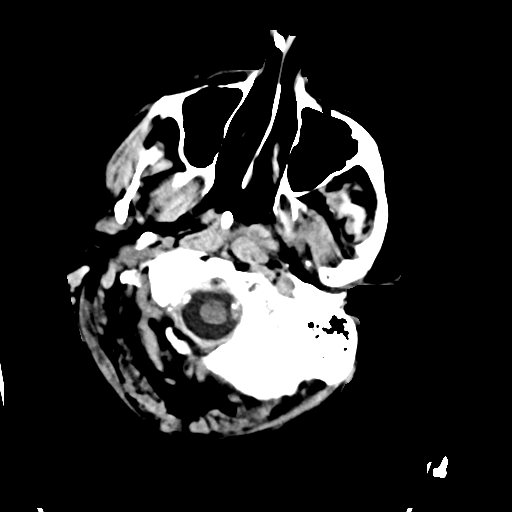
[im 5/35  bone]
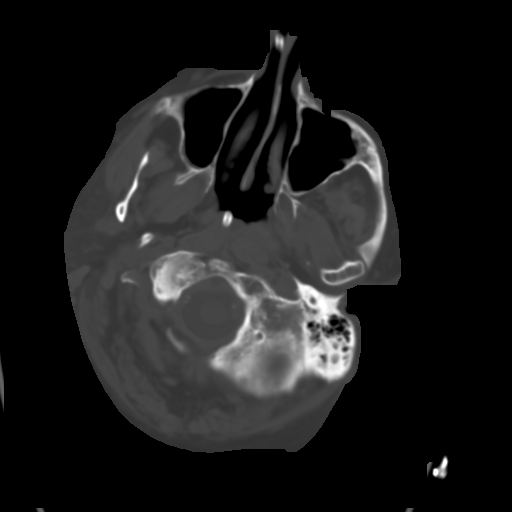
[im 9/35  brain]
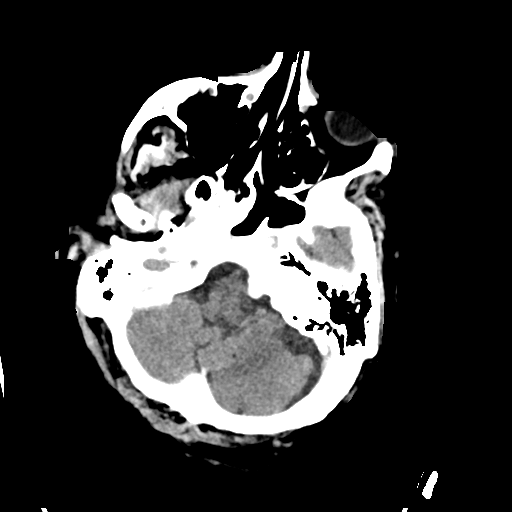
[im 13/35  brain]
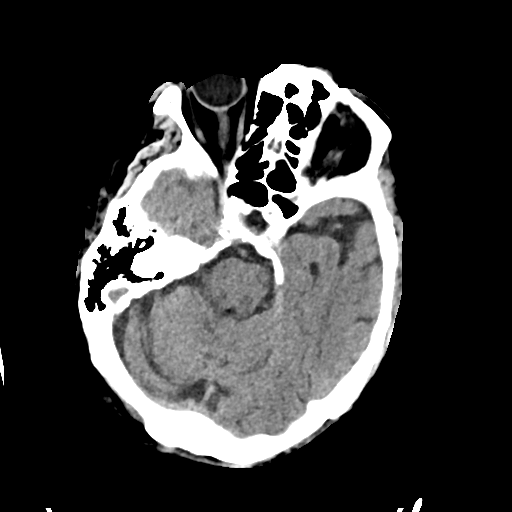
[im 18/35  brain]
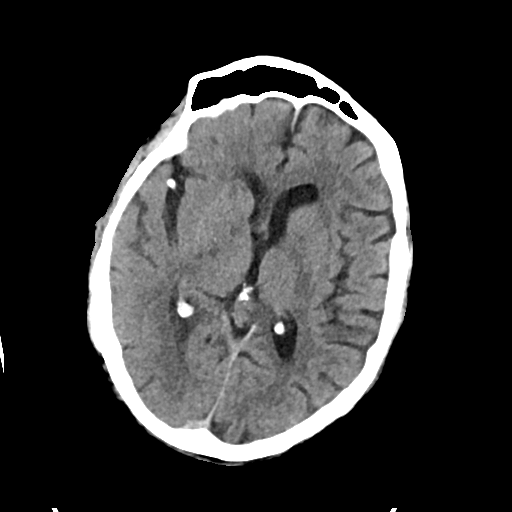
[im 22/35  brain]
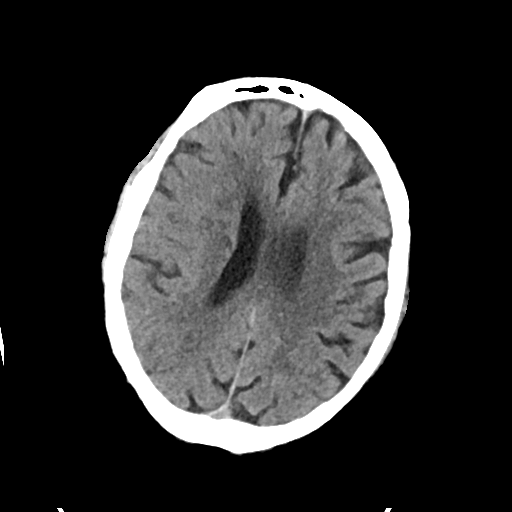
[im 22/35  bone]
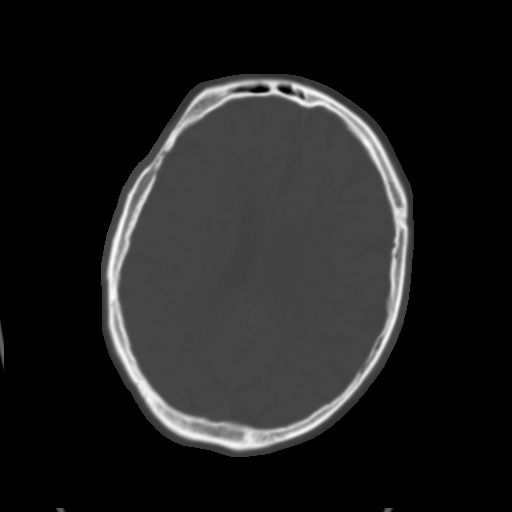
[im 26/35  brain]
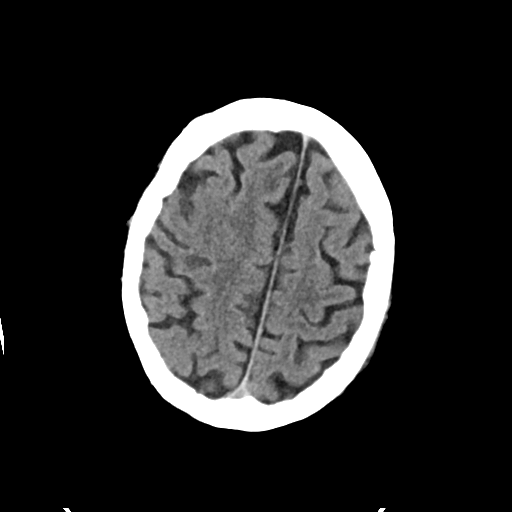
[im 30/35  brain]
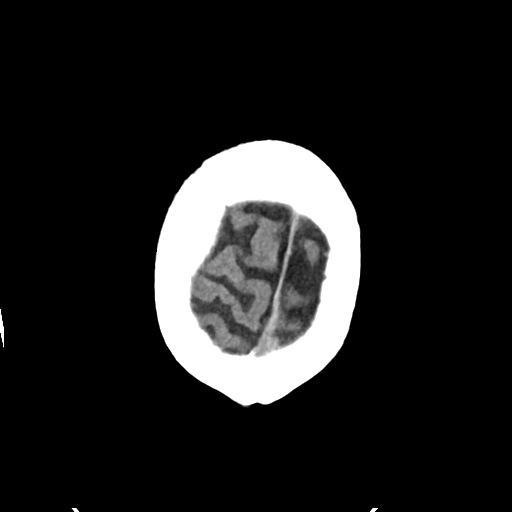

[Series 4: head bone · axial · 0.47mm/px · z∈[-120,-102]mm · 2 of 86 slices shown]
[im 9/86  bone]
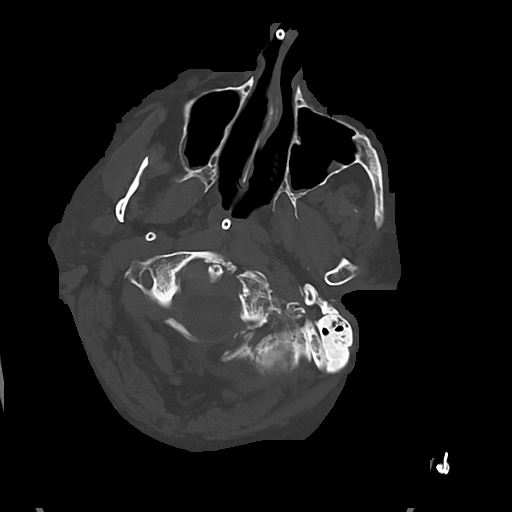
[im 18/86  bone]
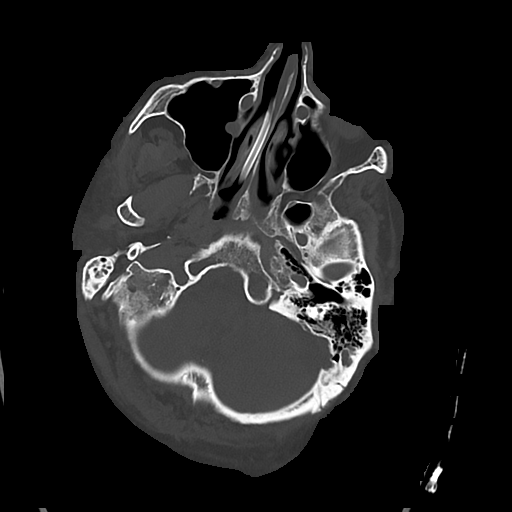

[Series 5: cor soft · coronal · 0.36mm/px · 3 of 94 slices shown]
[im 32/94  brain]
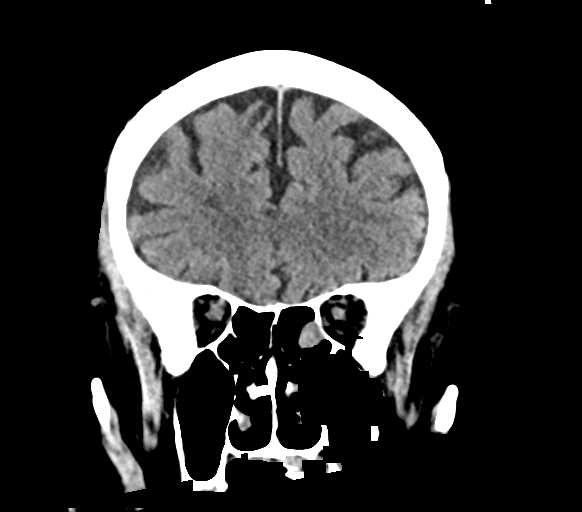
[im 42/94  brain]
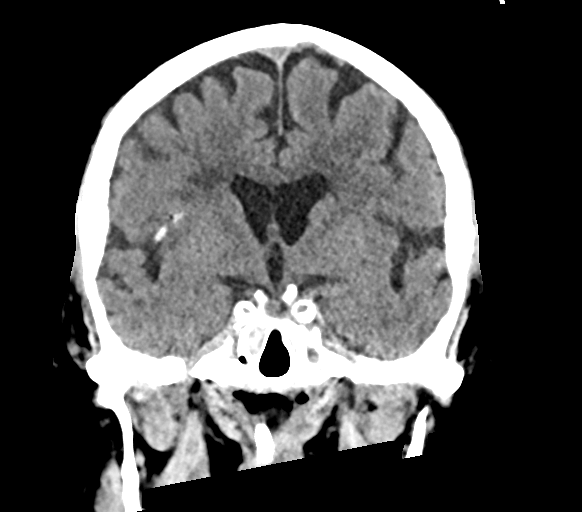
[im 52/94  brain]
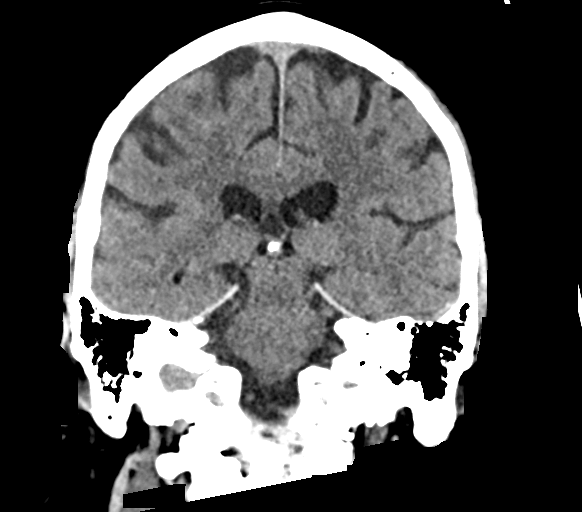

[Series 6: sag soft · sagittal · 0.36mm/px · 3 of 69 slices shown]
[im 29/69  brain]
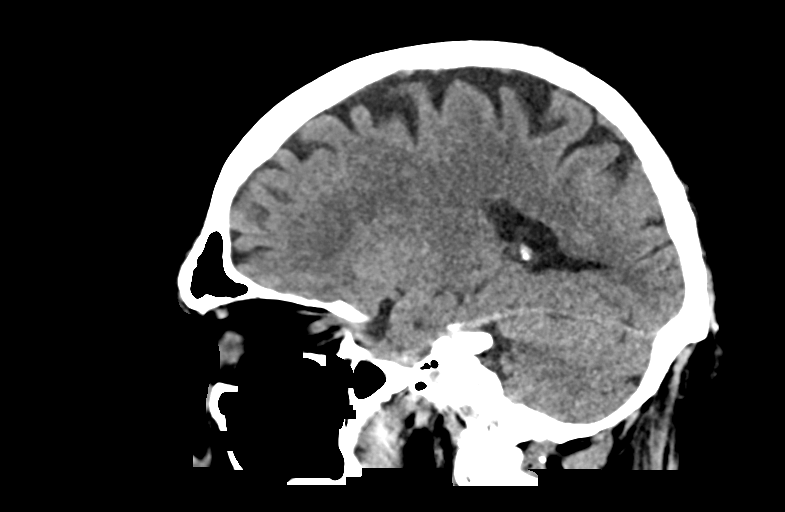
[im 35/69  brain]
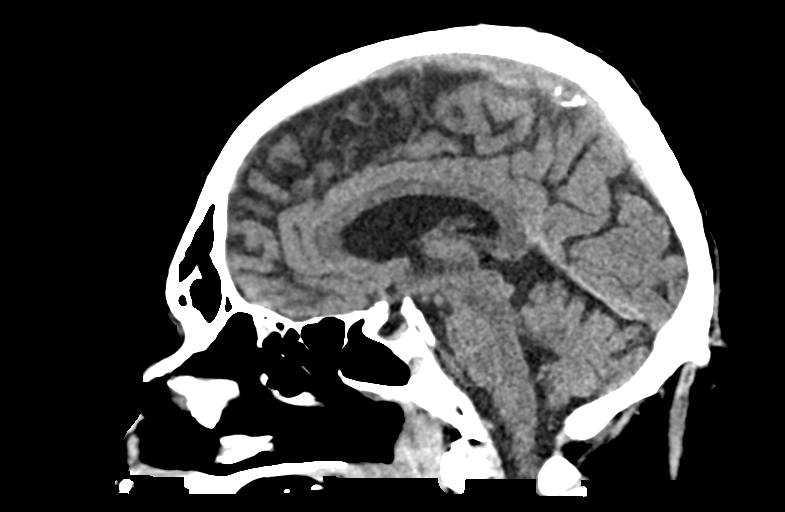
[im 40/69  brain]
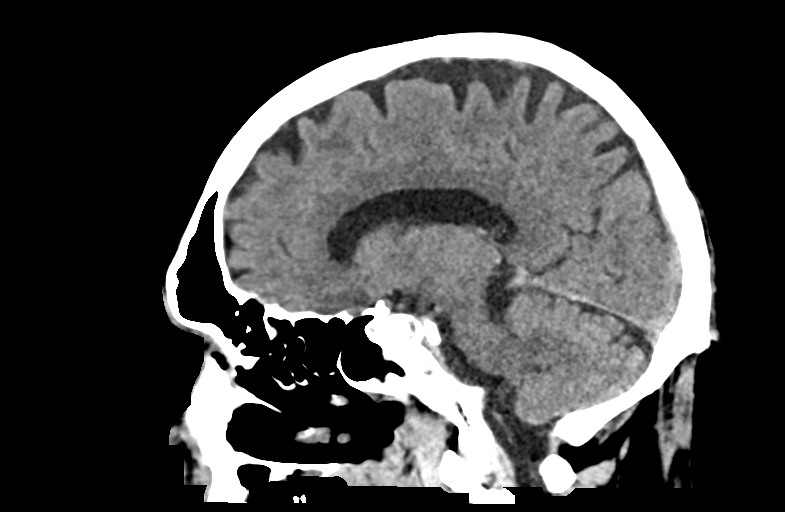

[15 of 47 positions shown; findings below may reference images not displayed]

FINDINGS: Brain: Scattered cortical and subcortical areas of hypoattenuation
within the right MCA distribution, compatible with evolving
infarcts. No progressive mass effect or midline shift. Subtle
cortical versus sulcal hyperdensity along the high right posterior
frontal convexity may represent petechial hemorrhage versus trace
subarachnoid hemorrhage. No mass occupying hemorrhagic
transformation. No mass lesion. Additional mild patchy white matter
hypoattenuation, likely related to chronic microvascular ischemic
disease.

Vascular: Right M2 MCA stent.  Calcific atherosclerosis.

Skull: No acute fracture.

Sinuses/Orbits: Scattered paranasal sinus mucosal thickening.

Other: No mastoid effusions.
IMPRESSION: 1. Scattered cortical and subcortical areas of hypoattenuation
within the right MCA distribution, compatible with evolving
infarcts. No progressive mass effect. If there is concern for
expansion of known infarcts, an MRI could better evaluate.
2. Subtle cortical versus sulcal hyperdensity along the high right
posterior frontal convexity may represent petechial hemorrhage
versus trace subarachnoid hemorrhage. No mass occupying hemorrhagic
transformation.
3. Paranasal sinus mucosal thickening.

These results will be called to the ordering clinician or
representative by the Radiologist Assistant, and communication
documented in the PACS or [REDACTED].

## 2020-04-28 MED ORDER — SODIUM BICARBONATE 8.4 % IV SOLN
50.0000 meq | Freq: Once | INTRAVENOUS | Status: AC
  Administered 2020-04-28: 50 meq via INTRAVENOUS
  Filled 2020-04-28: qty 50

## 2020-04-28 MED ORDER — SODIUM ZIRCONIUM CYCLOSILICATE 10 G PO PACK
10.0000 g | PACK | Freq: Once | ORAL | Status: AC
  Administered 2020-04-28: 10 g
  Filled 2020-04-28: qty 1

## 2020-04-28 MED ORDER — METOLAZONE 5 MG PO TABS
10.0000 mg | ORAL_TABLET | Freq: Once | ORAL | Status: AC
  Administered 2020-04-28: 10 mg
  Filled 2020-04-28: qty 2

## 2020-04-28 MED ORDER — CHLORHEXIDINE GLUCONATE CLOTH 2 % EX PADS
6.0000 | MEDICATED_PAD | Freq: Every day | CUTANEOUS | Status: DC
  Administered 2020-04-28: 6 via TOPICAL

## 2020-04-28 MED ORDER — SODIUM CHLORIDE 0.9 % IV SOLN
2.0000 g | INTRAVENOUS | Status: DC
  Administered 2020-04-28: 2 g via INTRAVENOUS
  Filled 2020-04-28: qty 20
  Filled 2020-04-28: qty 2

## 2020-04-28 MED ORDER — METOLAZONE 5 MG PO TABS
10.0000 mg | ORAL_TABLET | Freq: Once | ORAL | Status: DC
  Filled 2020-04-28: qty 2

## 2020-04-28 MED ORDER — LABETALOL HCL 100 MG PO TABS
100.0000 mg | ORAL_TABLET | Freq: Two times a day (BID) | ORAL | Status: DC
  Administered 2020-04-28 (×2): 100 mg
  Filled 2020-04-28 (×3): qty 1

## 2020-04-28 MED ORDER — SODIUM ZIRCONIUM CYCLOSILICATE 5 G PO PACK
5.0000 g | PACK | Freq: Once | ORAL | Status: DC
  Filled 2020-04-28: qty 1

## 2020-04-28 MED ORDER — HYDRALAZINE HCL 25 MG PO TABS
25.0000 mg | ORAL_TABLET | Freq: Three times a day (TID) | ORAL | Status: DC
  Filled 2020-04-28: qty 1

## 2020-04-28 MED ORDER — ACETAMINOPHEN 650 MG RE SUPP: 650.0000 mg | RECTAL | Status: DC

## 2020-04-28 MED ORDER — ALBUMIN HUMAN 25 % IV SOLN
25.0000 g | Freq: Once | INTRAVENOUS | Status: AC
  Administered 2020-04-28: 25 g via INTRAVENOUS
  Filled 2020-04-28: qty 100

## 2020-04-28 MED ORDER — INSULIN ASPART 100 UNIT/ML ~~LOC~~ SOLN
3.0000 [IU] | SUBCUTANEOUS | Status: DC
  Administered 2020-04-28 – 2020-04-29 (×5): 3 [IU] via SUBCUTANEOUS

## 2020-04-28 MED ORDER — ACETAMINOPHEN 160 MG/5ML PO SOLN
650.0000 mg | ORAL | Status: DC
  Administered 2020-04-28 – 2020-05-01 (×14): 650 mg
  Filled 2020-04-28 (×14): qty 20.3

## 2020-04-28 MED ORDER — SODIUM POLYSTYRENE SULFONATE 15 GM/60ML PO SUSP
30.0000 g | Freq: Once | ORAL | Status: AC
  Administered 2020-04-28: 30 g
  Filled 2020-04-28: qty 120

## 2020-04-28 MED ORDER — ACETAMINOPHEN 325 MG PO TABS
650.0000 mg | ORAL_TABLET | ORAL | Status: DC
  Filled 2020-04-28: qty 2

## 2020-04-28 MED ORDER — BUSPIRONE HCL 15 MG PO TABS
30.0000 mg | ORAL_TABLET | Freq: Three times a day (TID) | ORAL | Status: DC
  Administered 2020-04-28 – 2020-04-30 (×5): 30 mg
  Filled 2020-04-28 (×5): qty 2

## 2020-04-28 MED ORDER — CHLORHEXIDINE GLUCONATE CLOTH 2 % EX PADS
6.0000 | MEDICATED_PAD | Freq: Every day | CUTANEOUS | Status: DC
  Administered 2020-04-30 – 2020-05-02 (×3): 6 via TOPICAL

## 2020-04-28 MED ORDER — HYDRALAZINE HCL 50 MG PO TABS
100.0000 mg | ORAL_TABLET | Freq: Four times a day (QID) | ORAL | Status: DC
  Administered 2020-04-28: 100 mg
  Filled 2020-04-28: qty 2

## 2020-04-28 MED ORDER — BUSPIRONE HCL 15 MG PO TABS: 30.0000 mg | ORAL_TABLET | Freq: Three times a day (TID) | ORAL | Status: DC

## 2020-04-28 MED ORDER — HEPARIN SODIUM (PORCINE) 1000 UNIT/ML IJ SOLN
INTRAMUSCULAR | Status: AC
  Filled 2020-04-28: qty 4

## 2020-04-28 MED ORDER — SODIUM ZIRCONIUM CYCLOSILICATE 10 G PO PACK
10.0000 g | PACK | Freq: Once | ORAL | Status: DC
  Filled 2020-04-28: qty 1

## 2020-04-28 NOTE — Progress Notes (Signed)
SLP Cancellation Note  Patient Details Name: Robert Daniel MRN: [id number] DOB: 09/20/1940   Cancelled treatment:       Reason Eval/Treat Not Completed: Patient not medically ready (remains intubated, sedated). Will f/u as able.    Osie Bond., M.A. [place] Acute Rehabilitation Services Pager [phone] Office [phone]  04/28/2020, 7:21 AM

## 2020-04-28 NOTE — Consult Note (Signed)
[facility] Renal Consultation Note  Requesting MD: Kipp Brood, MD Indication for Consultation:  AKI   Chief complaint: weakness and slurred speech  HPI: Robert Daniel is a 80 y.o. male with a history of HTN, CKD, DM, and atrial fibrillation who presented with weakness and slurred speech, and left facial droop.  Found to have right MCA CVA and transferred to [facility].  He is s/p thrombectomy.  Course has been complicated by renal failure with hypotension, pause vs asystole per charting with spontaneous ROSC per charting.  Nephrology is consulted for hyperkalemia and volume overload - has been on lasix gtt augmented by metolazone.  Yesterday with 1.4 liters UOP over 2/21. Spoke to his sister, [name] via phone.  We discussed risks/benefits/indications for dialysis and she does consent to proceed with dialysis and dialysis catheter placement.  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  Critical care ok with HD.  Per nursing it's been harder to get him to follow commands today.    Creatinine  Date/Time Value Ref Range Status  04/05/2016 12:00 AM 1.1 0.6 - 1.3 Final  06/11/2014 12:00 AM 0.9 0.6 - 1.3 Final  10/31/2011 09:26 AM 1.05 0.60 - 1.30 mg/dL Final  12/18/2009 12:00 AM 0.9 0.6 - 1.3 Final  04/13/2007 12:00 AM 1.0 0.6 - 1.3 Final   Creat  Date/Time Value Ref Range Status  07/16/2019 07:51 AM 1.35 (H) 0.70 - 1.11 mg/dL Final    Comment:    For patients >63 years of age, the reference limit for Creatinine is approximately 13% higher for people identified as African-American. Marland Kitchen   08/28/2018 08:44 AM 1.10 0.70 - 1.18 mg/dL Final    Comment:    For patients >27 years of age, the reference limit for Creatinine is approximately 13% higher for people identified as African-American. Marland Kitchen   08/08/2017 09:15 AM 1.27 (H) 0.70 - 1.18 mg/dL Final    Comment:    For patients >34 years of age, the reference limit for Creatinine is approximately 13%  higher for people identified as African-American. Marland Kitchen   03/14/2017 08:13 AM 1.15 0.70 - 1.18 mg/dL Final    Comment:    For patients >43 years of age, the reference limit for Creatinine is approximately 13% higher for people identified as African-American. .   08/23/2016 12:00 AM 1.07  Final   Creatinine, Ser  Date/Time Value Ref Range Status  04/28/2020 05:00 AM 4.38 (H) 0.61 - 1.24 mg/dL Final  04/27/2020 04:44 AM 3.93 (H) 0.61 - 1.24 mg/dL Final  04/26/2020 05:35 PM 3.71 (H) 0.61 - 1.24 mg/dL Final  04/26/2020 04:23 AM 3.35 (H) 0.61 - 1.24 mg/dL Final  04/21/2020 08:10 PM 3.17 (H) 0.61 - 1.24 mg/dL Final  04/14/2020 02:17 PM 3.00 (H) 0.61 - 1.24 mg/dL Final  12/04/2019 11:47 AM 1.23 0.61 - 1.24 mg/dL Final  10/28/2019 10:17 AM 1.39 (H) 0.61 - 1.24 mg/dL Final  10/15/2019 11:59 AM 1.41 (H) 0.76 - 1.27 mg/dL Final  10/09/2019 10:29 AM 1.42 (H) 0.61 - 1.24 mg/dL Final  10/01/2019 12:51 PM 1.33 (H) 0.76 - 1.27 mg/dL Final     PMHx:   Past Medical History:  Diagnosis Date  . Hyperlipidemia 02/16/2017  . Hypertension     Past Surgical History:  Procedure Laterality Date  . CARDIAC CATHETERIZATION  2013  . CARDIOVERSION N/A 11/29/2019   Procedure: CARDIOVERSION;  Surgeon: Minna Merritts, MD;  Location: [facility] ORS;  Service: Cardiovascular;  Laterality: N/A;  . CATARACT  EXTRACTION Bilateral 04/21/2008   R eye 05-12-08  . IR CT HEAD LTD  04/12/2020  . IR INTRA CRAN STENT  04/27/2020  . IR PERCUTANEOUS ART THROMBECTOMY/INFUSION INTRACRANIAL INC DIAG ANGIO  04/23/2020  . IR US GUIDE VASC ACCESS RIGHT  04/16/2020  . RADIOLOGY WITH ANESTHESIA N/A 04/09/2020   Procedure: RADIOLOGY WITH ANESTHESIA;  Surgeon: Radiologist, Medication, MD;  Location: [place];  Service: Radiology;  Laterality: N/A;    Family Hx: No family history on file.  Pt intubated and not able to provide  Social History:  reports that he has never smoked. He has never used smokeless tobacco. He reports that he does  not drink alcohol and does not use drugs.  Allergies: No Known Allergies  Medications: Prior to Admission medications   Medication Sig Start Date End Date Taking? Authorizing Provider  rivaroxaban (XARELTO) 20 MG TABS tablet Take 1 tablet (20 mg total) by mouth daily with supper. 03/26/20  Yes Minna Merritts, MD  acetaminophen (TYLENOL) 500 MG tablet Take 500 mg by mouth every 6 (six) hours as needed.    [provider]  Ascorbic Acid (VITAMIN C) 1000 MG tablet Take 1,000 mg by mouth daily.    [provider]  aspirin EC 81 MG tablet Take 81 mg by mouth daily.    [provider]  atorvastatin (LIPITOR) 80 MG tablet Take 1 tablet (80 mg total) by mouth daily. 12/19/19 03/18/20  Marrianne Mood D, PA-C  benazepril (LOTENSIN) 40 MG tablet Take 1 tablet by mouth once daily 11/25/19   Loel Dubonnet, NP  Cinnamon 500 MG TABS Take 1,000 mg by mouth daily.    [provider]  cloNIDine (CATAPRES) 0.2 MG tablet Take 1 tablet by mouth 4 times daily 01/28/20   Parks Ranger, Devonne Doughty, DO  co-enzyme Q-10 30 MG capsule Take 30 mg by mouth 3 (three) times daily.    [provider]  felodipine (PLENDIL) 10 MG 24 hr tablet Take 1 tablet (10 mg total) by mouth daily. 03/12/19   Minna Merritts, MD  gemfibrozil (LOPID) 600 MG tablet TAKE 1 TABLET BY MOUTH TWICE DAILY BEFORE A MEAL 07/30/19   Karamalegos, Devonne Doughty, DO  glimepiride (AMARYL) 4 MG tablet Take 1 tablet (4 mg total) by mouth daily with breakfast. 03/26/20   Karamalegos, Devonne Doughty, DO  hydrALAZINE (APRESOLINE) 100 MG tablet TAKE 1 TABLET BY MOUTH IN THE MORNING, AT NOON, IN THE EVENING, AND AT BEDTIME 02/25/20   Loel Dubonnet, NP  ibuprofen (ADVIL,MOTRIN) 200 MG tablet Take 800 mg by mouth every 6 (six) hours as needed.    [provider]  isosorbide mononitrate (IMDUR) 60 MG 24 hr tablet Take 1 tablet by mouth twice daily 02/24/20   Loel Dubonnet, NP  labetalol (NORMODYNE) 100 MG  tablet Take 1 tablet (100 mg total) by mouth 2 (two) times daily. Take 1 tablet (100 mg total) by mouth 2 (two) times daily. May take an additional half tablet as needed for systolic blood pressure [id number]. 05/09/19   Loel Dubonnet, NP  metFORMIN (GLUCOPHAGE) 1000 MG tablet TAKE 1 TABLET BY MOUTH TWICE DAILY WITH A MEAL 01/28/20   Karamalegos, Devonne Doughty, DO  Multiple Vitamin (MULTIVITAMIN) tablet Take 1 tablet by mouth daily.    [provider]  nitroGLYCERIN (NITROSTAT) 0.4 MG SL tablet DISSOLVE ONE TABLET UNDER THE TONGUE EVERY 5 MINUTES AS NEEDED FOR CHEST PAIN.  DO NOT EXCEED A TOTAL OF 3 DOSES IN 15  MINUTES 12/31/19   Minna Merritts, MD  Omega-3 Fatty Acids (OMEGA-3 FISH OIL) 1200 MG CAPS Take 2,000 mg by mouth 2 (two) times daily with a meal.    [provider]  torsemide (DEMADEX) 20 MG tablet Take 1 tablet (20 mg total) by mouth every other day. 10/10/19 01/08/20  Marrianne Mood D, PA-C  Turmeric 500 MG TABS Take by mouth daily.     [provider]    I have reviewed the patient's current and reported prior to admission medications.  Labs:  BMP Latest Ref Rng & Units 04/28/2020 04/27/2020 04/26/2020  Glucose 70 - 99 mg/dL 274(H) 126(H) 126(H)  BUN 8 - 23 mg/dL 132(H) 101(H) 90(H)  Creatinine 0.61 - 1.24 mg/dL 4.38(H) 3.93(H) 3.71(H)  BUN/Creat Ratio 10 - 24 - - -  Sodium 135 - 145 mmol/L 141 140 139  Potassium 3.5 - 5.1 mmol/L 6.1(H) 5.4(H) 5.0  Chloride 98 - 111 mmol/L 109 108 108  CO2 22 - 32 mmol/L 15(L) 16(L) 16(L)  Calcium 8.9 - 10.3 mg/dL 7.8(L) 8.0(L) 8.1(L)    Urinalysis No results found for: COLORURINE, APPEARANCEUR, LABSPEC, PHURINE, GLUCOSEU, HGBUR, BILIRUBINUR, KETONESUR, PROTEINUR, UROBILINOGEN, NITRITE, LEUKOCYTESUR   ROS: Unable to obtain secondary to ams intubated   Physical Exam: Vitals:   04/28/20 0808 04/28/20 0900  BP:    Pulse:  (!) 117  Resp:  19  Temp:  (!) 102.02 F (38.9 C)  SpO2: 92% 94%     General: elderly male  intubated  HEENT: NCAT Eyes: closed; does not open eyes to exam Neck: trachea midline; increased neck circumference  Heart: tachy S1S2 no rub Lungs: coarse breath sounds on vent 40 fi02/10 PEEP Abdomen: soft/obese/nt with sedation Extremities: 1+ edema diffusely Skin: no rash on extremities exposed Neuro: sedation currently running  Assessment/Plan:  # AKI - secondary to ischemic ATN and pre-renal insults - stroke, contrast, pause vs asystole reported.  Note presented with AKI from last baseline - note home meds include ACE inhibitor, ibuprofen, torsemide  - Start HD today after nontunneled catheter with critical care - may be later this evening.  Appreciate critical care - stop lasix gtt - assess lasix on non-HD days for now - Check UA, up/cr ratio  - obtain renal ultrasound  # Hyperkalemia  - lokelma 10 gram  - bicarb once  - repeat potassium noon - notify Dr. Royce Macadamia if above 5.5  # Metabolic acidosis  - setting of AKI bicarb once to temporize - starting HD as above  # CVA - acute - s/p thrombectomy - per primary team   # HTN  - per primary in setting of stroke  # Anemia  - no ESA setting of acute CVA - PRBC's for goal of 7 (though 8 preferred given new AKI)  # CKD stage 3 - previous baseline Cr 1.4 last fall - presented with AKI from that baseline   Claudia Desanctis 04/28/2020, 10:02 AM

## 2020-04-28 NOTE — Progress Notes (Signed)
Inpatient Diabetes Program Recommendations  AACE/ADA: New Consensus Statement on Inpatient Glycemic Control   Target Ranges:  Prepandial:   less than 140 mg/dL      Peak postprandial:   less than 180 mg/dL (1-2 hours)      Critically ill patients:  140 - 180 mg/dL  Results for Robert Daniel, Robert Daniel (MRN [id number]) as of 04/28/2020 12:10  Ref. Range 04/27/2020 07:33 04/27/2020 11:22 04/27/2020 15:08 04/27/2020 19:23 04/27/2020 23:10 04/28/2020 03:15 04/28/2020 07:25 04/28/2020 11:23  Glucose-Capillary Latest Ref Range: 70 - 99 mg/dL 118 (H) 130 (H) 133 (H) 167 (H) 197 (H) 229 (H) 260 (H) 242 (H)    Review of Glycemic Control  Diabetes history: DM2 Outpatient Diabetes medications: Amaryl 4 mg QAM, Metformin 1000 mg BID Current orders for Inpatient glycemic control: Novolog 0-15 units Q4H, Novolog 3 units Q4H for tube feeding coverage; Osmolite @ 55 ml/hr  Inpatient Diabetes Program Recommendations:    Insulin: Noted Novolog 3 units Q4H ordered today for tube feeding coverage. If glucose remains consistently over 180 mg/dl with added tube feeding coverage, please consider increasing it to Novolog 6 units Q4H.  Thanks, Barnie Alderman, RN, MSN, CDE Diabetes Coordinator Inpatient Diabetes Program [phone] (Team Pager from 8am to 5pm)

## 2020-04-28 NOTE — Progress Notes (Signed)
Upon initial assessment, pt was not following any commands. No movement to painful stimuli x4. Pt was previously following commands on RUE and RLE. Neuro MD paged. Assessment completed with neurology MD at bedside. STAT head CT obtained.

## 2020-04-28 NOTE — Procedures (Signed)
Central Venous Catheter Insertion Procedure Note  Robert Daniel  [id number]  Oct 21, 1940  Date:04/28/20  Time:12:02 PM   Provider Performing:Pratyush Ammon Jerilynn Mages Ayesha Rumpf   Procedure: Insertion of Non-tunneled Central Venous Catheter(36556)with US guidance [id number])    Indication(s) Hemodialysis  Consent Risks of the procedure as well as the alternatives and risks of each were explained to the patient and/or caregiver.  Consent for the procedure was obtained and is signed in the bedside chart  Anesthesia Topical only with 1% lidocaine   Timeout Verified patient identification, verified procedure, site/side was marked, verified correct patient position, special equipment/implants available, medications/allergies/relevant history reviewed, required imaging and test results available.  Sterile Technique Maximal sterile technique including full sterile barrier drape, hand hygiene, sterile gown, sterile gloves, mask, hair covering, sterile ultrasound probe cover (if used).  Procedure Description Area of catheter insertion was cleaned with chlorhexidine and draped in sterile fashion.   With real-time ultrasound guidance a HD catheter was placed into the right internal jugular vein.  Nonpulsatile blood flow and easy flushing noted in all ports.  The catheter was sutured in place and sterile dressing applied.  Complications/Tolerance None; patient tolerated the procedure well. Chest X-ray is ordered to verify placement for internal jugular or subclavian cannulation.  Chest x-ray is not ordered for femoral cannulation.  EBL Minimal  Specimen(s) None  The entire procedure was supervised by/proctored by Montey Hora, PA-C.  Lestine Mount, PA-C [facility] 04/28/20 12:02 PM  Please see [email or web address] for pager details.

## 2020-04-28 NOTE — Progress Notes (Addendum)
NAME:  Robert Daniel, MRN:  [id number], DOB:  [birth year], LOS: 3 ADMISSION DATE:  04/23/2020, CONSULTATION DATE:  04/12/2020 REFERRING MD:  Erlinda Hong - neuro, CHIEF COMPLAINT:  R MCA CVA, respiratory failure  Brief History:  80 yo R MCA CVA, s/p stent in NIR, remains intubated  History of Present Illness:  80 yo M PMH Afib on xarelto, HTN, DM2, rpesented to [facility] 2/19 with weakness and slurred speech. Sx began 2/18, but resolved, until 2/19 approx noon when he had L arm weakness, slurred speech, L facial droop. Last took xarelto 2/18 evening. Found to have R MCA CVA Transferred to [facility], and taken urgently to NIR for mechanical thrombectomy of proximal R M2/MCA occlusion, with subsequent reocclusion x 3, followed by intracranial stent deployment with TICI3 recanalization. NIR course also c/b pt instability-- with hypotension,  pause vs asystole with spontaneous ROSC following a prop bolus. He received 2 PRBC in NIR for hypotension; as well as albumin and neo. Remains intubated, transferring to ICU. Received repeat paralytic prior to ICU transfer   Initial labs Na 137 K 4.8 Cr 3 Hgb 8.1 BNP 1653, PCT 2.2, WBC 16.8, INR 2.2  On iSTAT 4 hours later, K 7.2, Hgb 6.8   PCCM consulted in this setting  Past Medical History:  HTN Afib HLD DM2  Significant Hospital Events:  2/19 [facility] ED as code stroke. R MCA CVA. Transferred to [facility] for NIR. Brief asystolic arrest with spontaneous ROSC vs pause in NIR. Remains intubated 2/19 Mechanical thrombectomy right M2 occlusion with TICI-3 flow.  Consults:  NIR PCCM  Procedures:   2/19 ETT> 2/19 Art line>   Significant Diagnostic Tests:  2/19 CTA H> R MCA proximal occlusion   Micro Data:  COVID-19 neg  Antimicrobials:  none  Interim History / Subjective:   Slow to arouse following thrombectomy. Unable to wean due to volume overload, started on IV furosemide infusion, but minimal response to diuresis.   Objective   Blood pressure (!) 177/87, pulse (!)  107, temperature (!) 101.84 F (38.8 C), resp. rate (!) 24, height '6\' 1"'$  (1.854 m), weight 113 kg, SpO2 92 %.    Vent Mode: PRVC FiO2 (%):  [40 %] 40 % Set Rate:  [22 bmp] 22 bmp Vt Set:  [630 mL] 630 mL PEEP:  [8 cmH20] 8 cmH20 Plateau Pressure:  [18 cmH20-26 cmH20] 18 cmH20   Intake/Output Summary (Last 24 hours) at 04/28/2020 0750 Last data filed at 04/28/2020 0726 Gross per 24 hour  Intake 2201.8 ml  Output 1505 ml  Net 696.8 ml   Filed Weights   04/26/2020 2200  Weight: 113 kg    Examination: General: obese man intubated.  HENT:ETT secure - cuff leak corrected by re-inflation.  Lungs: chest clear bilaterally.  Tachypneic, Airway pressures adequate but oxygenation is marginal.  Mild air hunger Cardiovascular: irregular rhythm reg rate s1s2, JVP elevated.  Abdomen: Obese soft round non-distended Extremities: BLE 1+ pitting edema. No obvious joint deformity. R fem site without hematoma.  Neuro: no response to painful stimuli. PERL.  Did follow commands for RN GU: Foley in place, poor urine output   Patient is +10L since admission.   Resolved Hospital Problem list     Assessment & Plan:   Critically ill due to acute ischemic stroke S/P s/p mechanical thrombectomy-- subsequent reocclusion x 3, stent placement with TICI3, requiring mechanical ventilation for airway protection and titration of cleviprex to keep BP 120-140 to prevent hypoperfusion or hyperemic hemorrhage. Hypertension requiring titration of  clevidipine AKI on CKD - creatinine has doubled since 9/21 and continues to worsen Hyperkalemia due to AKI Uremia also may not be helping mental status.  Possible decompensated HFpEF based on EF from 2019, repeat echo is similar.  Anemia of acute illness.  Coagulopathy in setting of Xarelto use DM2 with hypoglycemia likely due to prolonged effect of sulfonylurea in face of worsening renal function - now resolved.  Leukocytosis  Plan:  - Patient is volume overloaded  which likely accounts for difficulty weaning. Will continue IV furosemide and metolazone - Continue ASA and Brillinta for stent, restart statin. Will hold other lipid therapy in face of worsening renal function.  - Nephrology consultation for worsening creatinine - Dr Royce Macadamia will see.    Daily Goals Checklist  Pain/Anxiety/Delirium protocol (if indicated): wean precedex to off , keep RASS -1  Neuro vitals: every 4 hours AED's: none VAP protocol (if indicated): bundle in place Respiratory support goals: Continue full ventilator support and increase PEEP Blood pressure target: 120-160 DVT prophylaxis: SCD's, start [place] heparin Nutrition Status: continue enteral nutrition.  GI prophylaxis: protonix Fluid status goals: Diuresis. May need HD Urinary catheter: Assessment of intravascular volume Central lines: PIV only Glucose control: improved on tube feeds.  Mobility/therapy needs: bedrest  Antibiotic de-escalation: none Home medication reconciliation: on hold Daily labs: BMP, CBC Code Status: Full  Family Communication: will update family once we have recommendations from nephrology. Anticipated duration of dialysis might be and important variable in [place] discussions. Disposition: ICU   Goals of Care:  Last date of multidisciplinary goals of care discussion: per primary  Family and staff present:  Summary of discussion:  Follow up goals of care discussion due:-- Code Status: Full   Labs   CBC: Recent Labs  Lab 04/19/2020 1417 05/01/2020 1609 04/17/2020 2010 04/16/2020 2043 04/26/20 0423 04/27/20 0444 04/28/20 0500  WBC 16.8*  --  16.1*  --  12.2* 12.2* 15.2*  NEUTROABS 15.3*  --   --   --   --   --   --   HGB 8.1*   < > 7.9* 7.5* 8.6* 8.6* 8.6*  HCT 25.0*   < > 25.3* 22.0* 27.5* 27.7* 28.0*  MCV 88.0  --  90.7  --  89.6 89.6 91.2  PLT 251  --  226  --  264 217 191   < > = values in this interval not displayed.    Basic Metabolic Panel: Recent Labs  Lab 04/09/2020 2010  04/15/2020 2043 04/26/20 0423 04/26/20 1735 04/27/20 0444 04/27/20 1632 04/28/20 0500  NA 142 141 141 139 140  --  141  K 5.7* 5.5* 4.9 5.0 5.4*  --  6.1*  CL 109  --  109 108 108  --  109  CO2 19*  --  17* 16* 16*  --  15*  GLUCOSE 89  --  42* 126* 126*  --  274*  BUN 81*  --  85* 90* 101*  --  132*  CREATININE 3.17*  --  3.35* 3.71* 3.93*  --  4.38*  CALCIUM 9.9  --  8.5* 8.1* 8.0*  --  7.8*  MG  --   --   --  2.1 2.1 2.3 2.3  PHOS  --   --   --  7.3* 8.2* 8.4* 9.3*   GFR: Estimated Creatinine Clearance: 17.7 mL/min (A) (by C-G formula based on SCr of 4.38 mg/dL (H)). Recent Labs  Lab 04/29/2020 1411 04/09/2020 1417 04/22/2020 2010 04/26/20 0423 04/27/20 0444  04/28/20 0500  PROCALCITON 2.21  --   --   --   --   --   WBC  --    < > 16.1* 12.2* 12.2* 15.2*   < > = values in this interval not displayed.    Liver Function Tests: Recent Labs  Lab 04/23/2020 1417  AST 37  ALT 28  ALKPHOS 66  BILITOT 0.7  PROT 6.1*  ALBUMIN 2.4*   No results for input(s): LIPASE, AMYLASE in the last 168 hours. No results for input(s): AMMONIA in the last 168 hours.  ABG    Component Value Date/Time   PHART 7.291 (L) 04/20/2020 2043   PCO2ART 36.3 04/14/2020 2043   PO2ART 103 04/13/2020 2043   HCO3 17.9 (L) 05/01/2020 2043   TCO2 19 (L) 04/20/2020 2043   ACIDBASEDEF 8.0 (H) 05/01/2020 2043   O2SAT 98.0 04/24/2020 2043     Coagulation Profile: Recent Labs  Lab 04/13/2020 1417 04/26/20 0206  INR 2.2* 2.4*    Cardiac Enzymes: No results for input(s): CKTOTAL, CKMB, CKMBINDEX, TROPONINI in the last 168 hours.  HbA1C: Hemoglobin A1C  Date/Time Value Ref Range Status  01/28/2020 10:58 AM 7.3 (A) 4.0 - 5.6 % Final  08/23/2016 12:00 AM 7.6  Final  04/05/2016 12:00 AM 7.0  Final   Hgb A1c MFr Bld  Date/Time Value Ref Range Status  04/26/2020 04:23 AM 9.8 (H) 4.8 - 5.6 % Final    Comment:    (NOTE) Pre diabetes:          5.7%-6.4%  Diabetes:              >6.4%  Glycemic  control for   <7.0% adults with diabetes   07/16/2019 07:51 AM 8.3 (H) <5.7 % of total Hgb Final    Comment:    For someone without known diabetes, a hemoglobin A1c value of 6.5% or greater indicates that they may have  diabetes and this should be confirmed with a follow-up  test. . For someone with known diabetes, a value <7% indicates  that their diabetes is well controlled and a value  greater than or equal to 7% indicates suboptimal  control. A1c targets should be individualized based on  duration of diabetes, age, comorbid conditions, and  other considerations. . Currently, no consensus exists regarding use of hemoglobin A1c for diagnosis of diabetes for children. .     CBG: Recent Labs  Lab 04/27/20 1508 04/27/20 1923 04/27/20 2310 04/28/20 0315 04/28/20 0725  GLUCAP 133* 167* 197* 229* 260*   BNP    Component Value Date/Time   BNP 1,653.5 (H) 04/14/2020 1417     CRITICAL CARE Performed by: Kipp Brood   Total critical care time: 45 minutes  Critical care time was exclusive of separately billable procedures and treating other patients.  Critical care was necessary to treat or prevent imminent or life-threatening deterioration.  Critical care was time spent personally by me on the following activities: development of treatment plan with patient and/or surrogate as well as nursing, discussions with consultants, evaluation of patient's response to treatment, examination of patient, obtaining history from patient or surrogate, ordering and performing treatments and interventions, ordering and review of laboratory studies, ordering and review of radiographic studies, pulse oximetry, re-evaluation of patient's condition and participation in multidisciplinary rounds.  Kipp Brood, MD [facility] ICU Physician [place]  Pager: [phone] Mobile: [phone] After hours: [phone].   04/28/2020, 7:50 AM

## 2020-04-28 NOTE — Progress Notes (Signed)
Pt. Transported to CT and back without any complications, RT will continue to monitor

## 2020-04-28 NOTE — Procedures (Signed)
Seen and examined on dialysis.  Procedure supervised.  Blood pressure 113/48 and HR 109.  RIJ nontunneled catheter.  Art line positional but has been hypotensive.  UF goal was decreased.  Try for 0.5 -1 kg as tolerated.  Will give albumin once now.  On clevidipine gtt earlier today - now off a few hours.  On scheduled labetalol and hydralazine is 100 mg every 6 hours.  Reduce hydralazine to 25 mg TID for now to allow for fluid removal - likely more successful for tomorrow's tx.   Claudia Desanctis, MD 04/28/2020 5:03 PM

## 2020-04-28 NOTE — Telephone Encounter (Signed)
error 

## 2020-04-28 NOTE — Progress Notes (Addendum)
STROKE TEAM PROGRESS NOTE   SUBJECTIVE (INTERVAL HISTORY) Pt is evaluated at the bedside. On Neuro Exam, Pt is still intubated, doesn't follows commands. Doesn't open eyes to noxious stimuli. Pt is only able to wiggle toes of Rt side on request. Not able to lift extremities against gravity. Pt has fluid overload. Systolic BP between 425-956 and diastolic between 38-75 mmHg. Pt is febrile at temp 102.38.  Na 141, K high at 6.1, creatinine elevated at 4.38, P at 9.3. WBC's increased to 15.2. Pt is on ASA 81 mg and brilinta 90 mg due to stenting.   ECHO showed Overall LVEF is probably normal to mildly decreased.  CXR done today showed Suspect pneumonia right perihilar region. Cultures pending. CCM following. Nephro consulted, advised HD today and stopping Lasix. Renal Usg pending.   OBJECTIVE  Temp:  [100.4 F (38 C)-102.2 F (39 C)] 102.2 F (39 C) (02/22 1000) Pulse Rate:  [70-123] 111 (02/22 1000) Cardiac Rhythm: Atrial fibrillation (02/22 0800) Resp:  [10-38] 16 (02/22 1000) BP: (126-177)/(62-87) 177/87 (02/21 1500) SpO2:  [89 %-100 %] 94 % (02/22 1000) Arterial Line BP: (100-176)/(47-74) 156/65 (02/22 1000) FiO2 (%):  [40 %] 40 % (02/22 0808)  Recent Labs  Lab 04/27/20 1508 04/27/20 1923 04/27/20 2310 04/28/20 0315 04/28/20 0725  GLUCAP 133* 167* 197* 229* 260*   Recent Labs  Lab 05/04/2020 2010 04/07/2020 2043 04/26/20 0423 04/26/20 1735 04/27/20 0444 04/27/20 1632 04/28/20 0500  NA 142 141 141 139 140  --  141  K 5.7* 5.5* 4.9 5.0 5.4*  --  6.1*  CL 109  --  109 108 108  --  109  CO2 19*  --  17* 16* 16*  --  15*  GLUCOSE 89  --  42* 126* 126*  --  274*  BUN 81*  --  85* 90* 101*  --  132*  CREATININE 3.17*  --  3.35* 3.71* 3.93*  --  4.38*  CALCIUM 9.9  --  8.5* 8.1* 8.0*  --  7.8*  MG  --   --   --  2.1 2.1 2.3 2.3  PHOS  --   --   --  7.3* 8.2* 8.4* 9.3*   Recent Labs  Lab 04/14/2020 1417  AST 37  ALT 28  ALKPHOS 66  BILITOT 0.7  PROT 6.1*  ALBUMIN 2.4*    Recent Labs  Lab 04/26/2020 1417 04/30/2020 1609 04/16/2020 2010 04/27/2020 2043 04/26/20 0423 04/27/20 0444 04/28/20 0500  WBC 16.8*  --  16.1*  --  12.2* 12.2* 15.2*  NEUTROABS 15.3*  --   --   --   --   --   --   HGB 8.1*   < > 7.9* 7.5* 8.6* 8.6* 8.6*  HCT 25.0*   < > 25.3* 22.0* 27.5* 27.7* 28.0*  MCV 88.0  --  90.7  --  89.6 89.6 91.2  PLT 251  --  226  --  264 217 191   < > = values in this interval not displayed.   No results for input(s): CKTOTAL, CKMB, CKMBINDEX, TROPONINI in the last 168 hours. Recent Labs    05/04/2020 1417 04/26/20 0206  LABPROT 23.8* 25.5*  INR 2.2* 2.4*   No results for input(s): COLORURINE, LABSPEC, PHURINE, GLUCOSEU, HGBUR, BILIRUBINUR, KETONESUR, PROTEINUR, UROBILINOGEN, NITRITE, LEUKOCYTESUR in the last 72 hours.  Invalid input(s): APPERANCEUR     Component Value Date/Time   CHOL 57 04/26/2020 0423   TRIG 180 (H) 04/26/2020 0423   HDL <  10 (L) 04/26/2020 0423   CHOLHDL NOT CALCULATED 04/26/2020 0423   VLDL 36 04/26/2020 0423   LDLCALC NOT CALCULATED 04/26/2020 0423   LDLCALC 121 (H) 07/16/2019 0751   Lab Results  Component Value Date   HGBA1C 9.8 (H) 04/26/2020   No results found for: LABOPIA, COCAINSCRNUR, [place], [place], THCU, [place]  Recent Labs  Lab 04/18/2020 [place] <10    I have personally reviewed the radiological images below and agree with the radiology interpretations.  DG Chest 1 View  Result Date: 04/15/2020 CLINICAL DATA:  Acute shortness of breath.  CVA. EXAM: CHEST  1 VIEW COMPARISON:  07/08/2019 chest radiograph FINDINGS: This is a low volume study. The cardiomediastinal silhouette is unchanged. Mild pulmonary vascular congestion noted. There is no evidence of focal airspace disease, pulmonary edema, suspicious pulmonary nodule/mass, pleural effusion, or pneumothorax. No acute bony abnormalities are identified. IMPRESSION: Mild pulmonary vascular congestion. Electronically Signed   By: Margarette Canada M.D.   On:  04/29/2020 15:02   MR ANGIO HEAD WO CONTRAST  Result Date: 04/26/2020 CLINICAL DATA:  Stroke follow-up. EXAM: MRI HEAD WITHOUT CONTRAST MRA HEAD WITHOUT CONTRAST TECHNIQUE: Multiplanar, multiecho pulse sequences of the brain and surrounding structures were obtained without intravenous contrast. Angiographic images of the head were obtained using MRA technique without contrast. COMPARISON:  CT a of the head neck from yesterday FINDINGS: MRI HEAD FINDINGS Brain: Patchy cortical infarction along the right MCA territory and reaching the right occipital cortex in this patient with large right posterior communicating artery. Two tiny cortical infarcts in the left MCA to distal ACA territory. No hemorrhage, mass effect, or hydrocephalus. Age normal brain volume. Mild pre-existing ischemic disease for age. Vascular: Arterial findings below. Skull and upper cervical spine: Normal marrow signal Sinuses/Orbits: Generalized mucosal thickening with fluid levels. MRA HEAD FINDINGS Right M2 stenting with expected associated artifact but visible internal flow on source images. On 3D reformats there is apparent high-grade stenosis with flow gap in the region of the M2 takeoff, but this is likely primarily from artifact based on source images. There is moderate atheromatous irregularity of bilateral MCA branches. Diffuse atheromatous irregularity of the left V4 segment and basilar with overall moderate stenosis except at the vertebrobasilar junction where narrowing appears high-grade. The right V4 segment is not seen until the patent PICA. IMPRESSION: 1. Scattered acute cortical infarcts in the right MCA territory and right occipital lobe. Two tiny cortical infarcts in the left cerebral hemisphere. 2. Present flow in the stented right M2 branch. 3. Advanced intracranial atherosclerosis as previously characterized. 4. Sinus inflammation. Electronically Signed   By: Monte Fantasia M.D.   On: 04/26/2020 04:34   MR BRAIN WO  CONTRAST  Result Date: 04/26/2020 CLINICAL DATA:  Stroke follow-up. EXAM: MRI HEAD WITHOUT CONTRAST MRA HEAD WITHOUT CONTRAST TECHNIQUE: Multiplanar, multiecho pulse sequences of the brain and surrounding structures were obtained without intravenous contrast. Angiographic images of the head were obtained using MRA technique without contrast. COMPARISON:  CT a of the head neck from yesterday FINDINGS: MRI HEAD FINDINGS Brain: Patchy cortical infarction along the right MCA territory and reaching the right occipital cortex in this patient with large right posterior communicating artery. Two tiny cortical infarcts in the left MCA to distal ACA territory. No hemorrhage, mass effect, or hydrocephalus. Age normal brain volume. Mild pre-existing ischemic disease for age. Vascular: Arterial findings below. Skull and upper cervical spine: Normal marrow signal Sinuses/Orbits: Generalized mucosal thickening with fluid levels. MRA HEAD FINDINGS Right M2 stenting  with expected associated artifact but visible internal flow on source images. On 3D reformats there is apparent high-grade stenosis with flow gap in the region of the M2 takeoff, but this is likely primarily from artifact based on source images. There is moderate atheromatous irregularity of bilateral MCA branches. Diffuse atheromatous irregularity of the left V4 segment and basilar with overall moderate stenosis except at the vertebrobasilar junction where narrowing appears high-grade. The right V4 segment is not seen until the patent PICA. IMPRESSION: 1. Scattered acute cortical infarcts in the right MCA territory and right occipital lobe. Two tiny cortical infarcts in the left cerebral hemisphere. 2. Present flow in the stented right M2 branch. 3. Advanced intracranial atherosclerosis as previously characterized. 4. Sinus inflammation. Electronically Signed   By: Monte Fantasia M.D.   On: 04/26/2020 04:34   IR Intra Cran Stent  Result Date:  04/27/2020 INDICATION: 80 year old male with past medical history significant for diabetes mellitus type 2, hypertension, hyperlipidemia and atrial fibrillation status post cardioversion in November 2021 on Xarelto. Patient had an episode of transient slurred speech and mild left arm weakness on 04/24/2020. Around noon on 04/10/2020, he developed significant left arm weakness, slurred speech and left facial droop. He was taken to outside hospital where he was found to have diminished opacification of the right MCA branches on CTA with area of increased T-max in the right MCA territory. He was taken to our service for a diagnostic cerebral angiogram and possible mechanical thrombectomy. EXAM: Ultrasound-guided vascular access Diagnostic cerebral angiogram Mechanical thrombectomy Intracranial stenting Flat panel head CT COMPARISON:  CT/CT angiogram of the head and neck April 25, 2020. MEDICATIONS: Refer to anesthesia documentation. ANESTHESIA/SEDATION: The procedure was performed under general anesthesia. CONTRAST:  75 mL of Omnipaque 240 milligrams/mL FLUOROSCOPY TIME:  Fluoroscopy Time: 113 minutes 6 seconds (2,379 mGy). COMPLICATIONS: None immediate. TECHNIQUE: Informed written consent was obtained from the patient's sister after a thorough discussion of the procedural risks, benefits and alternatives. All questions were addressed. Maximal Sterile Barrier Technique was utilized including caps, mask, sterile gowns, sterile gloves, sterile drape, hand hygiene and skin antiseptic. A timeout was performed prior to the initiation of the procedure. The right groin was prepped and draped in the usual sterile fashion. Using a micropuncture kit and the modified Seldinger technique, access was gained to the right common femoral artery and an 8 French sheath was placed. Real-time ultrasound guidance was utilized for vascular access including the acquisition of a permanent ultrasound image documenting patency of the  accessed vessel. Under fluoroscopy, a 6 [place] Berenstein 2 catheter was navigated over a 0.035" Terumo Glidewire into the aortic arch. The catheter was placed into the right common carotid artery and then advanced into the right internal carotid artery. Frontal and lateral angiograms of the head were obtained. FINDINGS: 1. Near occlusion of a proximal right M2/MCA middle division branch with minimal delayed contrast penetration of distal branches. 2. Atherosclerotic changes of the right carotid siphon without hemodynamically significant stenosis. 3. Atherosclerotic changes of the right common femoral artery without hemodynamically significant stenosis. PROCEDURE: Under biplane roadmap, the Berenstein 2 catheter was exchanged over the wire for a zoom 88 guide catheter. The catheter was placed in the upper cervical segment of the right ICA. A zoom 71 catheter was then navigated through the guide catheter into the petrous segment of the right ICA. Magnified frontal and lateral views of the head were obtained. Then a phenom 21 microcatheter was navigated over a synchro support microguidewire into the right  M1/MCA. Multiple attempts to gain access to the right M2 branch proved unsuccessful. The synchro support micro guidewire was then exchanged for a 0.016" headliner micro guidewire which was advanced into the distal right M2/MCA branch. The microcatheter was then advanced over the wire. The microwire was removed and a 4 x 40 solitaire thrombectomy device was deployed spanning the right M2/MCA middle division branch and distal M1 segment. The device was allowed to intercalated with the clot for 4 minutes. The aspiration catheter was advanced to the level of occlusion. The microcatheter was removed. The aspiration catheter and thrombectomy device were removed under constant aspiration. Follow-up right ICA angiograms with magnified frontal and lateral views of the head showed brisk opacification of the right M2/MCA middle  division branch with irregularity noted at the branch origin [facility]). Delayed follow-up angiogram showed branch reocclusion. Under biplane roadmap, the zoom 71 aspiration catheter was again navigated over a phenom 21 microcatheter and a headliner microguidewire into the cavernous segment of the right ICA. The microcatheter was then navigated over the wire into the right M2/MCA middle division branch. Then, a 4 x 40 mm solitaire stent retriever was deployed spanning the M2 and distal M1 segments. The device was allowed to intercalated with the clot for 4 minutes. The microcatheter was removed. The aspiration catheter was advanced to the level of occlusion and connected to a penumbra aspiration pump. The thrombectomy device and aspiration catheter were removed under constant aspiration. Follow-up right ICA angiograms with magnified frontal and lateral views of the head showed brisk opacification of the right M2/MCA middle division branch with irregularity noted at the branch origin [facility]). Delayed follow-up angiogram showed branch reocclusion. Under biplane roadmap, the zoom 71 aspiration catheter was again navigated over a phenom 21 microcatheter and a headliner microguidewire into the cavernous segment of the right ICA. The microcatheter was then navigated over the wire into the right M2/MCA middle division branch. Then, a 5 x 37 mm embotrap stent retriever was deployed spanning the M2 and distal M1 segments. The device was allowed to intercalated with the clot for 4 minutes. The microcatheter was removed. The aspiration catheter was advanced to the level of occlusion and connected to a penumbra aspiration pump. The thrombectomy device and aspiration catheter were removed under constant aspiration. Follow-up right ICA angiograms with magnified frontal and lateral views of the head showed recanalization of the right M2/MCA middle division branch with hairline stenosis at its origin due to atherosclerotic plaque  and stenosis at the mid M2 segment with very delayed opacification of distal branches. Flat panel CT of the head was obtained and post processed in a separate workstation with concurrent attending physician supervision. Selected images were sent to PACS. No evidence of hemorrhagic complication noted. Under biplane roadmap, the zoom 71 aspiration catheter was again navigated over a phenom 21 microcatheter and a Aristotle 14 microguidewire into the cavernous segment of the right ICA. The microcatheter was then navigated over the wire into the right M2/MCA middle division branch. The Aristotle microguidewire was exchanged for a an exchange length synchro microguidewire. At this point, patient developed severe bradycardia and eventually asystole. The microwire and microcatheter were removed. The table was lowered to start chest compressions. However, patient had spontaneous return of circulation before the start of resuscitation efforts. Follow-up right ICA angiograms with magnified frontal and lateral views of the head again showed recanalization of the right M2/MCA middle division branch with with hairline stenosis. No evidence of contrast extravasation. Under biplane roadmap, an SL  10 microcatheter was navigated over Aristotle 14 microguidewire into the right M2/MCA middle division branch. Subsequently, a 4.5 x 30 mm neuroform atlas stent was deployed spanning the distal right M1, proximal and mid M2/MCA middle division branch. Follow-up angiogram showed brisk flow through the entire right MCA territory. The catheter construct was subsequently withdrawn. A right common femoral angiogram was performed in right anterior oblique view. The puncture is at the common femoral artery level. Atherosclerotic plaques are noted without hemodynamically significant stenosis. The sheath was exchanged for an 8 [place] Angio-Seal which was utilized for access closure. Immediate hemostasis was achieved. IMPRESSION: Mechanical  thrombectomy and intracranial stenting performed for treatment of a proximal right M2/MCA middle division branch near occlusion with superimposed atherosclerotic plaque. A total of 3 passes performed with stent retriever followed by intracranial stenting. Intracranial stenting performed after bolus infusion of cangrelor followed by low-dose protocol drip. PLAN: Patient remained intubated and transferred to ICU given hemodynamic instability. Continue cangrelor drip until transition to aspirin and Brilinta. Family updated by telephone immediately after the end of the procedure. Electronically Signed   By: Pedro Earls M.D.   On: 04/27/2020 10:36   IR CT Head Ltd  Result Date: 04/27/2020 INDICATION: 80 year old male with past medical history significant for diabetes mellitus type 2, hypertension, hyperlipidemia and atrial fibrillation status post cardioversion in November 2021 on Xarelto. Patient had an episode of transient slurred speech and mild left arm weakness on 04/24/2020. Around noon on 05/04/2020, he developed significant left arm weakness, slurred speech and left facial droop. He was taken to outside hospital where he was found to have diminished opacification of the right MCA branches on CTA with area of increased T-max in the right MCA territory. He was taken to our service for a diagnostic cerebral angiogram and possible mechanical thrombectomy. EXAM: Ultrasound-guided vascular access Diagnostic cerebral angiogram Mechanical thrombectomy Intracranial stenting Flat panel head CT COMPARISON:  CT/CT angiogram of the head and neck April 25, 2020. MEDICATIONS: Refer to anesthesia documentation. ANESTHESIA/SEDATION: The procedure was performed under general anesthesia. CONTRAST:  75 mL of Omnipaque 240 milligrams/mL FLUOROSCOPY TIME:  Fluoroscopy Time: 113 minutes 6 seconds (2,379 mGy). COMPLICATIONS: None immediate. TECHNIQUE: Informed written consent was obtained from the patient's  sister after a thorough discussion of the procedural risks, benefits and alternatives. All questions were addressed. Maximal Sterile Barrier Technique was utilized including caps, mask, sterile gowns, sterile gloves, sterile drape, hand hygiene and skin antiseptic. A timeout was performed prior to the initiation of the procedure. The right groin was prepped and draped in the usual sterile fashion. Using a micropuncture kit and the modified Seldinger technique, access was gained to the right common femoral artery and an 8 French sheath was placed. Real-time ultrasound guidance was utilized for vascular access including the acquisition of a permanent ultrasound image documenting patency of the accessed vessel. Under fluoroscopy, a 6 [place] Berenstein 2 catheter was navigated over a 0.035" Terumo Glidewire into the aortic arch. The catheter was placed into the right common carotid artery and then advanced into the right internal carotid artery. Frontal and lateral angiograms of the head were obtained. FINDINGS: 1. Near occlusion of a proximal right M2/MCA middle division branch with minimal delayed contrast penetration of distal branches. 2. Atherosclerotic changes of the right carotid siphon without hemodynamically significant stenosis. 3. Atherosclerotic changes of the right common femoral artery without hemodynamically significant stenosis. PROCEDURE: Under biplane roadmap, the Berenstein 2 catheter was exchanged over the wire for a zoom 88  guide catheter. The catheter was placed in the upper cervical segment of the right ICA. A zoom 71 catheter was then navigated through the guide catheter into the petrous segment of the right ICA. Magnified frontal and lateral views of the head were obtained. Then a phenom 21 microcatheter was navigated over a synchro support microguidewire into the right M1/MCA. Multiple attempts to gain access to the right M2 branch proved unsuccessful. The synchro support micro guidewire was  then exchanged for a 0.016" headliner micro guidewire which was advanced into the distal right M2/MCA branch. The microcatheter was then advanced over the wire. The microwire was removed and a 4 x 40 solitaire thrombectomy device was deployed spanning the right M2/MCA middle division branch and distal M1 segment. The device was allowed to intercalated with the clot for 4 minutes. The aspiration catheter was advanced to the level of occlusion. The microcatheter was removed. The aspiration catheter and thrombectomy device were removed under constant aspiration. Follow-up right ICA angiograms with magnified frontal and lateral views of the head showed brisk opacification of the right M2/MCA middle division branch with irregularity noted at the branch origin [facility]). Delayed follow-up angiogram showed branch reocclusion. Under biplane roadmap, the zoom 71 aspiration catheter was again navigated over a phenom 21 microcatheter and a headliner microguidewire into the cavernous segment of the right ICA. The microcatheter was then navigated over the wire into the right M2/MCA middle division branch. Then, a 4 x 40 mm solitaire stent retriever was deployed spanning the M2 and distal M1 segments. The device was allowed to intercalated with the clot for 4 minutes. The microcatheter was removed. The aspiration catheter was advanced to the level of occlusion and connected to a penumbra aspiration pump. The thrombectomy device and aspiration catheter were removed under constant aspiration. Follow-up right ICA angiograms with magnified frontal and lateral views of the head showed brisk opacification of the right M2/MCA middle division branch with irregularity noted at the branch origin [facility]). Delayed follow-up angiogram showed branch reocclusion. Under biplane roadmap, the zoom 71 aspiration catheter was again navigated over a phenom 21 microcatheter and a headliner microguidewire into the cavernous segment of the right ICA.  The microcatheter was then navigated over the wire into the right M2/MCA middle division branch. Then, a 5 x 37 mm embotrap stent retriever was deployed spanning the M2 and distal M1 segments. The device was allowed to intercalated with the clot for 4 minutes. The microcatheter was removed. The aspiration catheter was advanced to the level of occlusion and connected to a penumbra aspiration pump. The thrombectomy device and aspiration catheter were removed under constant aspiration. Follow-up right ICA angiograms with magnified frontal and lateral views of the head showed recanalization of the right M2/MCA middle division branch with hairline stenosis at its origin due to atherosclerotic plaque and stenosis at the mid M2 segment with very delayed opacification of distal branches. Flat panel CT of the head was obtained and post processed in a separate workstation with concurrent attending physician supervision. Selected images were sent to PACS. No evidence of hemorrhagic complication noted. Under biplane roadmap, the zoom 71 aspiration catheter was again navigated over a phenom 21 microcatheter and a Aristotle 14 microguidewire into the cavernous segment of the right ICA. The microcatheter was then navigated over the wire into the right M2/MCA middle division branch. The Aristotle microguidewire was exchanged for a an exchange length synchro microguidewire. At this point, patient developed severe bradycardia and eventually asystole. The microwire and microcatheter were  removed. The table was lowered to start chest compressions. However, patient had spontaneous return of circulation before the start of resuscitation efforts. Follow-up right ICA angiograms with magnified frontal and lateral views of the head again showed recanalization of the right M2/MCA middle division branch with with hairline stenosis. No evidence of contrast extravasation. Under biplane roadmap, an SL 10 microcatheter was navigated over Aristotle  14 microguidewire into the right M2/MCA middle division branch. Subsequently, a 4.5 x 30 mm neuroform atlas stent was deployed spanning the distal right M1, proximal and mid M2/MCA middle division branch. Follow-up angiogram showed brisk flow through the entire right MCA territory. The catheter construct was subsequently withdrawn. A right common femoral angiogram was performed in right anterior oblique view. The puncture is at the common femoral artery level. Atherosclerotic plaques are noted without hemodynamically significant stenosis. The sheath was exchanged for an 8 [place] Angio-Seal which was utilized for access closure. Immediate hemostasis was achieved. IMPRESSION: Mechanical thrombectomy and intracranial stenting performed for treatment of a proximal right M2/MCA middle division branch near occlusion with superimposed atherosclerotic plaque. A total of 3 passes performed with stent retriever followed by intracranial stenting. Intracranial stenting performed after bolus infusion of cangrelor followed by low-dose protocol drip. PLAN: Patient remained intubated and transferred to ICU given hemodynamic instability. Continue cangrelor drip until transition to aspirin and Brilinta. Family updated by telephone immediately after the end of the procedure. Electronically Signed   By: Pedro Earls M.D.   On: 04/27/2020 10:36   IR US Guide Vasc Access Right  Result Date: 04/27/2020 INDICATION: 80 year old male with past medical history significant for diabetes mellitus type 2, hypertension, hyperlipidemia and atrial fibrillation status post cardioversion in November 2021 on Xarelto. Patient had an episode of transient slurred speech and mild left arm weakness on 04/24/2020. Around noon on 04/12/2020, he developed significant left arm weakness, slurred speech and left facial droop. He was taken to outside hospital where he was found to have diminished opacification of the right MCA branches on CTA  with area of increased T-max in the right MCA territory. He was taken to our service for a diagnostic cerebral angiogram and possible mechanical thrombectomy. EXAM: Ultrasound-guided vascular access Diagnostic cerebral angiogram Mechanical thrombectomy Intracranial stenting Flat panel head CT COMPARISON:  CT/CT angiogram of the head and neck April 25, 2020. MEDICATIONS: Refer to anesthesia documentation. ANESTHESIA/SEDATION: The procedure was performed under general anesthesia. CONTRAST:  75 mL of Omnipaque 240 milligrams/mL FLUOROSCOPY TIME:  Fluoroscopy Time: 113 minutes 6 seconds (2,379 mGy). COMPLICATIONS: None immediate. TECHNIQUE: Informed written consent was obtained from the patient's sister after a thorough discussion of the procedural risks, benefits and alternatives. All questions were addressed. Maximal Sterile Barrier Technique was utilized including caps, mask, sterile gowns, sterile gloves, sterile drape, hand hygiene and skin antiseptic. A timeout was performed prior to the initiation of the procedure. The right groin was prepped and draped in the usual sterile fashion. Using a micropuncture kit and the modified Seldinger technique, access was gained to the right common femoral artery and an 8 French sheath was placed. Real-time ultrasound guidance was utilized for vascular access including the acquisition of a permanent ultrasound image documenting patency of the accessed vessel. Under fluoroscopy, a 6 [place] Berenstein 2 catheter was navigated over a 0.035" Terumo Glidewire into the aortic arch. The catheter was placed into the right common carotid artery and then advanced into the right internal carotid artery. Frontal and lateral angiograms of the head were obtained. FINDINGS:  1. Near occlusion of a proximal right M2/MCA middle division branch with minimal delayed contrast penetration of distal branches. 2. Atherosclerotic changes of the right carotid siphon without hemodynamically significant  stenosis. 3. Atherosclerotic changes of the right common femoral artery without hemodynamically significant stenosis. PROCEDURE: Under biplane roadmap, the Berenstein 2 catheter was exchanged over the wire for a zoom 88 guide catheter. The catheter was placed in the upper cervical segment of the right ICA. A zoom 71 catheter was then navigated through the guide catheter into the petrous segment of the right ICA. Magnified frontal and lateral views of the head were obtained. Then a phenom 21 microcatheter was navigated over a synchro support microguidewire into the right M1/MCA. Multiple attempts to gain access to the right M2 branch proved unsuccessful. The synchro support micro guidewire was then exchanged for a 0.016" headliner micro guidewire which was advanced into the distal right M2/MCA branch. The microcatheter was then advanced over the wire. The microwire was removed and a 4 x 40 solitaire thrombectomy device was deployed spanning the right M2/MCA middle division branch and distal M1 segment. The device was allowed to intercalated with the clot for 4 minutes. The aspiration catheter was advanced to the level of occlusion. The microcatheter was removed. The aspiration catheter and thrombectomy device were removed under constant aspiration. Follow-up right ICA angiograms with magnified frontal and lateral views of the head showed brisk opacification of the right M2/MCA middle division branch with irregularity noted at the branch origin [facility]). Delayed follow-up angiogram showed branch reocclusion. Under biplane roadmap, the zoom 71 aspiration catheter was again navigated over a phenom 21 microcatheter and a headliner microguidewire into the cavernous segment of the right ICA. The microcatheter was then navigated over the wire into the right M2/MCA middle division branch. Then, a 4 x 40 mm solitaire stent retriever was deployed spanning the M2 and distal M1 segments. The device was allowed to intercalated  with the clot for 4 minutes. The microcatheter was removed. The aspiration catheter was advanced to the level of occlusion and connected to a penumbra aspiration pump. The thrombectomy device and aspiration catheter were removed under constant aspiration. Follow-up right ICA angiograms with magnified frontal and lateral views of the head showed brisk opacification of the right M2/MCA middle division branch with irregularity noted at the branch origin [facility]). Delayed follow-up angiogram showed branch reocclusion. Under biplane roadmap, the zoom 71 aspiration catheter was again navigated over a phenom 21 microcatheter and a headliner microguidewire into the cavernous segment of the right ICA. The microcatheter was then navigated over the wire into the right M2/MCA middle division branch. Then, a 5 x 37 mm embotrap stent retriever was deployed spanning the M2 and distal M1 segments. The device was allowed to intercalated with the clot for 4 minutes. The microcatheter was removed. The aspiration catheter was advanced to the level of occlusion and connected to a penumbra aspiration pump. The thrombectomy device and aspiration catheter were removed under constant aspiration. Follow-up right ICA angiograms with magnified frontal and lateral views of the head showed recanalization of the right M2/MCA middle division branch with hairline stenosis at its origin due to atherosclerotic plaque and stenosis at the mid M2 segment with very delayed opacification of distal branches. Flat panel CT of the head was obtained and post processed in a separate workstation with concurrent attending physician supervision. Selected images were sent to PACS. No evidence of hemorrhagic complication noted. Under biplane roadmap, the zoom 71 aspiration catheter was again  navigated over a phenom 21 microcatheter and a Aristotle 14 microguidewire into the cavernous segment of the right ICA. The microcatheter was then navigated over the wire into  the right M2/MCA middle division branch. The Aristotle microguidewire was exchanged for a an exchange length synchro microguidewire. At this point, patient developed severe bradycardia and eventually asystole. The microwire and microcatheter were removed. The table was lowered to start chest compressions. However, patient had spontaneous return of circulation before the start of resuscitation efforts. Follow-up right ICA angiograms with magnified frontal and lateral views of the head again showed recanalization of the right M2/MCA middle division branch with with hairline stenosis. No evidence of contrast extravasation. Under biplane roadmap, an SL 10 microcatheter was navigated over Aristotle 14 microguidewire into the right M2/MCA middle division branch. Subsequently, a 4.5 x 30 mm neuroform atlas stent was deployed spanning the distal right M1, proximal and mid M2/MCA middle division branch. Follow-up angiogram showed brisk flow through the entire right MCA territory. The catheter construct was subsequently withdrawn. A right common femoral angiogram was performed in right anterior oblique view. The puncture is at the common femoral artery level. Atherosclerotic plaques are noted without hemodynamically significant stenosis. The sheath was exchanged for an 8 [place] Angio-Seal which was utilized for access closure. Immediate hemostasis was achieved. IMPRESSION: Mechanical thrombectomy and intracranial stenting performed for treatment of a proximal right M2/MCA middle division branch near occlusion with superimposed atherosclerotic plaque. A total of 3 passes performed with stent retriever followed by intracranial stenting. Intracranial stenting performed after bolus infusion of cangrelor followed by low-dose protocol drip. PLAN: Patient remained intubated and transferred to ICU given hemodynamic instability. Continue cangrelor drip until transition to aspirin and Brilinta. Family updated by telephone immediately  after the end of the procedure. Electronically Signed   By: Pedro Earls M.D.   On: 04/27/2020 10:36   CT CEREBRAL PERFUSION W CONTRAST  Result Date: 04/11/2020 CLINICAL DATA:  Slurred speech and left-sided weakness. EXAM: CT ANGIOGRAPHY HEAD AND NECK CT PERFUSION BRAIN TECHNIQUE: Multidetector CT imaging of the head and neck was performed using the standard protocol during bolus administration of intravenous contrast. Multiplanar CT image reconstructions and MIPs were obtained to evaluate the vascular anatomy. Carotid stenosis measurements (when applicable) are obtained utilizing NASCET criteria, using the distal internal carotid diameter as the denominator. Multiphase CT imaging of the brain was performed following IV bolus contrast injection. Subsequent parametric perfusion maps were calculated using RAPID software. CONTRAST:  57m OMNIPAQUE IOHEXOL 350 MG/ML SOLN COMPARISON:  None. FINDINGS: CT HEAD FINDINGS Brain: There is no evidence of an acute infarct, intracranial hemorrhage, mass, midline shift, or extra-axial fluid collection. Mild cerebral atrophy is within normal limits for age. Patchy hypodensities in the cerebral white matter bilaterally are nonspecific but compatible with mild chronic small vessel ischemic disease. There is a chronic lacunar infarct at the inferior aspect of the right caudate head. Vascular: Calcified atherosclerosis at the skull base. No hyperdense vessel. Skull: No fracture or suspicious osseous lesion. Sinuses/Orbits: Mild scattered mucosal thickening in the paranasal sinuses. Clear mastoid air cells. Bilateral cataract extraction. Other: None. ASPECTS (ASpickardStroke Program Early CT Score) - Ganglionic level infarction (caudate, lentiform nuclei, internal capsule, insula, M1-M3 cortex): 7 - Supraganglionic infarction (M4-M6 cortex): 3 Total score (0-10 with 10 being normal): 10 Review of the MIP images confirms the above findings CTA NECK FINDINGS Aortic  arch: Normal variant aortic arch branching pattern with common origin of the brachiocephalic and left common carotid  arteries. Mild calcified plaque in the aortic arch without evidence of a significant arch vessel origin stenosis. Right carotid system: Patent with mild scattered predominantly calcified plaque throughout the common carotid artery and at the carotid bifurcation. No evidence of a significant stenosis or dissection. Left carotid system: Patent with mild plaque in the mid common carotid artery not resulting in significant stenosis. More extensive, heavily calcified plaque at the carotid bifurcation results in less than 50% proximal ICA stenosis. No evidence of dissection. Vertebral arteries: There is either severe stenosis or short segment occlusion of the proximal left vertebral artery at its origin with the vessel being patent throughout the remainder of the neck. There is a diffusely thready appearance of the right vertebral artery V1 and V2 segments with the right V3 segment being occluded. Skeleton: Advanced cervical disc and facet degeneration. Other neck: No evidence of cervical lymphadenopathy or mass. Upper chest: Partially visualized small right pleural effusion. No apical lung consolidation. Review of the MIP images confirms the above findings CTA HEAD FINDINGS Anterior circulation: The internal carotid arteries are patent from skull base to carotid termini with calcified plaque resulting in mild cavernous and proximal supraclinoid stenosis bilaterally. The right M1 segment is patent with a mild stenosis proximally. There is a decreased number of distal right MCA branch vessels compared to the left, however a discrete proximal branch occlusion is not identified. The left MCA is patent with branch vessel irregularity as well as a mild-to-moderate proximal M2 stenosis. Both ACAs are patent without evidence of a significant proximal stenosis. No aneurysm is identified. Posterior circulation: The  intracranial left vertebral artery is patent but diffusely irregular and narrowed with multiple moderate to severe stenoses. The right V4 segment is occluded proximally with likely retrograde filling distally supplying the right PICA. 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 Patent SCAs are seen bilaterally. There is a fetal origin of the PCAs without evidence of a significant proximal PCA stenosis. No aneurysm is identified. Venous sinuses: Patent. Anatomic variants: Fetal origin of the PCAs. Review of the MIP images confirms the above findings CT Brain Perfusion Findings: ASPECTS: 10 CBF (<30%) Volume: 0 mL Perfusion (Tmax>6.0s) volume: 55 mL Mismatch Volume: 55 mL Infarction Location: No core infarct evident by CTP. Ischemic penumbra in the right MCA territory, greatest in the frontal lobe at and superior to the operculum though with parietal and temporal lobe involvement as well. IMPRESSION: CT HEAD: 1. No evidence of acute intracranial abnormality.  ASPECTS of 10. 2. Mild chronic small vessel ischemic disease. CTA HEAD AND NECK: 1. Decreased number of right MCA branch vessels without a discrete proximal occlusion identified. It is possible that this reflects a stump occlusion of a proximal to mid M2 branch which is not readily apparent by CTA. 2. Intracranial atherosclerosis with multiple anterior and posterior circulation stenoses as detailed above. 3. 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 4. Severe stenosis or short segment occlusion of the left vertebral artery at its origin with patency of the vessel more distally. Heavily diseased left V4 segment. 5. Cervical carotid atherosclerosis without significant stenosis. 6.  Aortic Atherosclerosis (ICD10-I70.0). CT BRAIN PERFUSION: 55 mL of ischemic penumbra in the right MCA territory without evidence of a core infarct. These results were  called by telephone at the time of interpretation on 04/26/2020 at 2:15 pm to Dr. Donnetta Simpers, who verbally acknowledged these results. Electronically Signed   By:  Logan Bores M.D.   On: 04/19/2020 14:58   DG CHEST PORT 1 VIEW  Result Date: 04/28/2020 CLINICAL DATA:  Hypoxia EXAM: PORTABLE CHEST 1 VIEW COMPARISON:  April 26, 2020 FINDINGS: Endotracheal tube tip is 2.2 cm above the carina. Enteric tube tip is below the diaphragm. No pneumothorax. There is airspace opacity overlying the right hilum, a change from 2 days prior. There is bibasilar atelectasis. Heart size and pulmonary vascularity are normal. No adenopathy. No bone lesions. There is aortic atherosclerosis. IMPRESSION: Tube positions as described without pneumothorax. Suspect pneumonia right perihilar region given new opacity in this area compared to 2 days prior. Bibasilar atelectasis. Stable cardiac silhouette. Aortic Atherosclerosis (ICD10-I70.0). Electronically Signed   By: Lowella Grip III M.D.   On: 04/28/2020 09:47   DG CHEST PORT 1 VIEW  Result Date: 04/26/2020 CLINICAL DATA:  Hypoxia EXAM: PORTABLE CHEST 1 VIEW COMPARISON:  04/09/2020 FINDINGS: The endotracheal tube terminates above the carina. There is elevation of the right hemidiaphragm as before. There are persistent but improving perihilar airspace opacities. There is no pneumothorax. No definite acute displaced fracture. No large pleural effusion. IMPRESSION: 1. Improving bilateral perihilar airspace opacities. 2. Endotracheal tube as above. Electronically Signed   By: Constance Holster M.D.   On: 04/26/2020 15:20   DG CHEST PORT 1 VIEW  Result Date: 04/12/2020 CLINICAL DATA:  Endotracheal placement EXAM: PORTABLE CHEST 1 VIEW COMPARISON:  Earlier same day FINDINGS: Endotracheal tube tip 4 cm above the carina. There is worsened atelectasis in the perihilar regions both sides. Possible pulmonary venous hypertension without [place] edema. No visible effusion.  IMPRESSION: Endotracheal tube tip 4 cm above the carina. Worsened perihilar atelectasis. Possible pulmonary venous hypertension. No Jashad edema. No visible effusion or pneumothorax. Electronically Signed   By: Nelson Chimes M.D.   On: 04/23/2020 19:29   DG Abd Portable 1V  Result Date: 04/26/2020 CLINICAL DATA:  OG tube placement. EXAM: PORTABLE ABDOMEN - 1 VIEW COMPARISON:  None. FINDINGS: An enteric tube is identified with tip overlying the proximal-mid stomach. IMPRESSION: Enteric tube with tip overlying the proximal-mid stomach. Electronically Signed   By: Margarette Canada M.D.   On: 04/26/2020 09:28   ECHOCARDIOGRAM COMPLETE  Result Date: 04/27/2020    ECHOCARDIOGRAM REPORT   Patient Name:   CASH DUCE Date of Exam: 04/27/2020 Medical Rec #:  [id number]       Height:       73.0 in Accession #:    [id number]      Weight:       249.1 lb Date of Birth:  04-04-1940       BSA:          2.362 m Patient Age:    45 years        BP:           118/66 mmHg Patient Gender: M               HR:           81 bpm. Exam Location:  Inpatient Procedure: 2D Echo, Cardiac Doppler, Color Doppler and Intracardiac            Opacification Agent Indications:    Stroke  History:        Patient has prior history of Echocardiogram examinations, most                 recent 03/14/2017. Abnormal ECG, Stroke, Arrythmias:Atrial  Fibrillation, Signs/Symptoms:Dyspnea and Shortness of Breath;                 Risk Factors:Hypertension, Diabetes and Dyslipidemia.  Sonographer:    Roseanna Rainbow RDCS Referring Phys: [id number] [facility]  Sonographer Comments: Technically difficult study due to poor echo windows, patient is morbidly obese and echo performed with patient supine and on artificial respirator. Image acquisition challenging due to patient body habitus. IMPRESSIONS  1. Poor acoustic windows and ectopy make accurate evaluation of LVEF difficult Definity used Overall LVEF is probably normal to mildly decreased Consider repeat when  patient extubated, able to turn . There is moderate left ventricular hypertrophy. Left ventricular diastolic function could not be evaluated.  2. Right ventricular systolic function is mildly reduced. The right ventricular size is normal. Mildly increased right ventricular wall thickness.  3. The mitral valve is normal in structure. No evidence of mitral valve regurgitation.  4. The aortic valve is tricuspid. Aortic valve regurgitation is not visualized. Mild to moderate aortic valve sclerosis/calcification is present, without any evidence of aortic stenosis.  5. The inferior vena cava is dilated in size with <50% respiratory variability, suggesting right atrial pressure of 15 mmHg. FINDINGS  Left Ventricle: Poor acoustic windows and ectopy make accurate evaluation of LVEF difficult Definity used Overall LVEF is probably normal to mildly decreased Consider repeat when patient extubated, able to turn. Definity contrast agent was given IV to delineate the left ventricular endocardial borders. The left ventricular internal cavity size was normal in size. There is moderate left ventricular hypertrophy. Left ventricular diastolic function could not be evaluated. Right Ventricle: The right ventricular size is normal. Mildly increased right ventricular wall thickness. Right ventricular systolic function is mildly reduced. Left Atrium: Left atrial size was normal in size. Right Atrium: Right atrial size was normal in size. Pericardium: There is no evidence of pericardial effusion. Presence of pericardial fat pad. Mitral Valve: The mitral valve is normal in structure. No evidence of mitral valve regurgitation. Tricuspid Valve: The tricuspid valve is normal in structure. Tricuspid valve regurgitation is trivial. Aortic Valve: The aortic valve is tricuspid. Aortic valve regurgitation is not visualized. Mild to moderate aortic valve sclerosis/calcification is present, without any evidence of aortic stenosis. Pulmonic Valve: The  pulmonic valve was not well visualized. Pulmonic valve regurgitation is not visualized. Aorta: The aortic root is normal in size and structure. Venous: The inferior vena cava is dilated in size with less than 50% respiratory variability, suggesting right atrial pressure of 15 mmHg. IAS/Shunts: The interatrial septum was not assessed.  LEFT VENTRICLE PLAX 2D LVIDd:         4.80 cm LVIDs:         3.90 cm LV PW:         2.00 cm LV IVS:        1.40 cm LVOT diam:     2.10 cm LV SV:         51 LV SV Index:   21 LVOT Area:     3.46 cm  LV Volumes (MOD) LV vol d, MOD A2C: 103.0 ml LV vol d, MOD A4C: 169.0 ml LV vol s, MOD A2C: 25.8 ml LV vol s, MOD A4C: 70.3 ml LV SV MOD A2C:     77.2 ml LV SV MOD A4C:     169.0 ml LV SV MOD BP:      92.2 ml RIGHT VENTRICLE            IVC RV S  prime:     7.29 cm/s  IVC diam: 3.20 cm TAPSE (M-mode): 1.6 cm LEFT ATRIUM             Index       RIGHT ATRIUM           Index LA diam:        5.30 cm 2.24 cm/m  RA Area:     17.00 cm LA Vol (A2C):   69.6 ml 29.46 ml/m RA Volume:   48.20 ml  20.41 ml/m LA Vol (A4C):   94.2 ml 39.88 ml/m LA Biplane Vol: 83.1 ml 35.18 ml/m  AORTIC VALVE LVOT Vmax:   90.80 cm/s LVOT Vmean:  65.000 cm/s LVOT VTI:    0.146 m  AORTA Ao Root diam: 3.70 cm Ao Asc diam:  3.60 cm MITRAL VALVE MV Area (PHT): 5.38 cm     SHUNTS MV Decel Time: 141 msec     Systemic VTI:  0.15 m MV E velocity: 107.90 cm/s  Systemic Diam: 2.10 cm Dorris Carnes MD Electronically signed by Dorris Carnes MD Signature Date/Time: 04/27/2020/7:51:44 PM    Final    IR PERCUTANEOUS ART THROMBECTOMY/INFUSION INTRACRANIAL INC DIAG ANGIO  Result Date: 04/27/2020 INDICATION: 80 year old male with past medical history significant for diabetes mellitus type 2, hypertension, hyperlipidemia and atrial fibrillation status post cardioversion in November 2021 on Xarelto. Patient had an episode of transient slurred speech and mild left arm weakness on 04/24/2020. Around noon on 04/12/2020, he developed  significant left arm weakness, slurred speech and left facial droop. He was taken to outside hospital where he was found to have diminished opacification of the right MCA branches on CTA with area of increased T-max in the right MCA territory. He was taken to our service for a diagnostic cerebral angiogram and possible mechanical thrombectomy. EXAM: Ultrasound-guided vascular access Diagnostic cerebral angiogram Mechanical thrombectomy Intracranial stenting Flat panel head CT COMPARISON:  CT/CT angiogram of the head and neck April 25, 2020. MEDICATIONS: Refer to anesthesia documentation. ANESTHESIA/SEDATION: The procedure was performed under general anesthesia. CONTRAST:  75 mL of Omnipaque 240 milligrams/mL FLUOROSCOPY TIME:  Fluoroscopy Time: 113 minutes 6 seconds (2,379 mGy). COMPLICATIONS: None immediate. TECHNIQUE: Informed written consent was obtained from the patient's sister after a thorough discussion of the procedural risks, benefits and alternatives. All questions were addressed. Maximal Sterile Barrier Technique was utilized including caps, mask, sterile gowns, sterile gloves, sterile drape, hand hygiene and skin antiseptic. A timeout was performed prior to the initiation of the procedure. The right groin was prepped and draped in the usual sterile fashion. Using a micropuncture kit and the modified Seldinger technique, access was gained to the right common femoral artery and an 8 French sheath was placed. Real-time ultrasound guidance was utilized for vascular access including the acquisition of a permanent ultrasound image documenting patency of the accessed vessel. Under fluoroscopy, a 6 [place] Berenstein 2 catheter was navigated over a 0.035" Terumo Glidewire into the aortic arch. The catheter was placed into the right common carotid artery and then advanced into the right internal carotid artery. Frontal and lateral angiograms of the head were obtained. FINDINGS: 1. Near occlusion of a proximal  right M2/MCA middle division branch with minimal delayed contrast penetration of distal branches. 2. Atherosclerotic changes of the right carotid siphon without hemodynamically significant stenosis. 3. Atherosclerotic changes of the right common femoral artery without hemodynamically significant stenosis. PROCEDURE: Under biplane roadmap, the Berenstein 2 catheter was exchanged over the wire for a zoom 88 guide  catheter. The catheter was placed in the upper cervical segment of the right ICA. A zoom 71 catheter was then navigated through the guide catheter into the petrous segment of the right ICA. Magnified frontal and lateral views of the head were obtained. Then a phenom 21 microcatheter was navigated over a synchro support microguidewire into the right M1/MCA. Multiple attempts to gain access to the right M2 branch proved unsuccessful. The synchro support micro guidewire was then exchanged for a 0.016" headliner micro guidewire which was advanced into the distal right M2/MCA branch. The microcatheter was then advanced over the wire. The microwire was removed and a 4 x 40 solitaire thrombectomy device was deployed spanning the right M2/MCA middle division branch and distal M1 segment. The device was allowed to intercalated with the clot for 4 minutes. The aspiration catheter was advanced to the level of occlusion. The microcatheter was removed. The aspiration catheter and thrombectomy device were removed under constant aspiration. Follow-up right ICA angiograms with magnified frontal and lateral views of the head showed brisk opacification of the right M2/MCA middle division branch with irregularity noted at the branch origin [facility]). Delayed follow-up angiogram showed branch reocclusion. Under biplane roadmap, the zoom 71 aspiration catheter was again navigated over a phenom 21 microcatheter and a headliner microguidewire into the cavernous segment of the right ICA. The microcatheter was then navigated over the  wire into the right M2/MCA middle division branch. Then, a 4 x 40 mm solitaire stent retriever was deployed spanning the M2 and distal M1 segments. The device was allowed to intercalated with the clot for 4 minutes. The microcatheter was removed. The aspiration catheter was advanced to the level of occlusion and connected to a penumbra aspiration pump. The thrombectomy device and aspiration catheter were removed under constant aspiration. Follow-up right ICA angiograms with magnified frontal and lateral views of the head showed brisk opacification of the right M2/MCA middle division branch with irregularity noted at the branch origin [facility]). Delayed follow-up angiogram showed branch reocclusion. Under biplane roadmap, the zoom 71 aspiration catheter was again navigated over a phenom 21 microcatheter and a headliner microguidewire into the cavernous segment of the right ICA. The microcatheter was then navigated over the wire into the right M2/MCA middle division branch. Then, a 5 x 37 mm embotrap stent retriever was deployed spanning the M2 and distal M1 segments. The device was allowed to intercalated with the clot for 4 minutes. The microcatheter was removed. The aspiration catheter was advanced to the level of occlusion and connected to a penumbra aspiration pump. The thrombectomy device and aspiration catheter were removed under constant aspiration. Follow-up right ICA angiograms with magnified frontal and lateral views of the head showed recanalization of the right M2/MCA middle division branch with hairline stenosis at its origin due to atherosclerotic plaque and stenosis at the mid M2 segment with very delayed opacification of distal branches. Flat panel CT of the head was obtained and post processed in a separate workstation with concurrent attending physician supervision. Selected images were sent to PACS. No evidence of hemorrhagic complication noted. Under biplane roadmap, the zoom 71 aspiration catheter  was again navigated over a phenom 21 microcatheter and a Aristotle 14 microguidewire into the cavernous segment of the right ICA. The microcatheter was then navigated over the wire into the right M2/MCA middle division branch. The Aristotle microguidewire was exchanged for a an exchange length synchro microguidewire. At this point, patient developed severe bradycardia and eventually asystole. The microwire and microcatheter were removed.  The table was lowered to start chest compressions. However, patient had spontaneous return of circulation before the start of resuscitation efforts. Follow-up right ICA angiograms with magnified frontal and lateral views of the head again showed recanalization of the right M2/MCA middle division branch with with hairline stenosis. No evidence of contrast extravasation. Under biplane roadmap, an SL 10 microcatheter was navigated over Aristotle 14 microguidewire into the right M2/MCA middle division branch. Subsequently, a 4.5 x 30 mm neuroform atlas stent was deployed spanning the distal right M1, proximal and mid M2/MCA middle division branch. Follow-up angiogram showed brisk flow through the entire right MCA territory. The catheter construct was subsequently withdrawn. A right common femoral angiogram was performed in right anterior oblique view. The puncture is at the common femoral artery level. Atherosclerotic plaques are noted without hemodynamically significant stenosis. The sheath was exchanged for an 8 [place] Angio-Seal which was utilized for access closure. Immediate hemostasis was achieved. IMPRESSION: Mechanical thrombectomy and intracranial stenting performed for treatment of a proximal right M2/MCA middle division branch near occlusion with superimposed atherosclerotic plaque. A total of 3 passes performed with stent retriever followed by intracranial stenting. Intracranial stenting performed after bolus infusion of cangrelor followed by low-dose protocol drip. PLAN:  Patient remained intubated and transferred to ICU given hemodynamic instability. Continue cangrelor drip until transition to aspirin and Brilinta. Family updated by telephone immediately after the end of the procedure. Electronically Signed   By: Pedro Earls M.D.   On: 04/27/2020 10:36   CT ANGIO HEAD CODE STROKE  Result Date: 04/16/2020 CLINICAL DATA:  Slurred speech and left-sided weakness. EXAM: CT ANGIOGRAPHY HEAD AND NECK CT PERFUSION BRAIN TECHNIQUE: Multidetector CT imaging of the head and neck was performed using the standard protocol during bolus administration of intravenous contrast. Multiplanar CT image reconstructions and MIPs were obtained to evaluate the vascular anatomy. Carotid stenosis measurements (when applicable) are obtained utilizing NASCET criteria, using the distal internal carotid diameter as the denominator. Multiphase CT imaging of the brain was performed following IV bolus contrast injection. Subsequent parametric perfusion maps were calculated using RAPID software. CONTRAST:  27m OMNIPAQUE IOHEXOL 350 MG/ML SOLN COMPARISON:  None. FINDINGS: CT HEAD FINDINGS Brain: There is no evidence of an acute infarct, intracranial hemorrhage, mass, midline shift, or extra-axial fluid collection. Mild cerebral atrophy is within normal limits for age. Patchy hypodensities in the cerebral white matter bilaterally are nonspecific but compatible with mild chronic small vessel ischemic disease. There is a chronic lacunar infarct at the inferior aspect of the right caudate head. Vascular: Calcified atherosclerosis at the skull base. No hyperdense vessel. Skull: No fracture or suspicious osseous lesion. Sinuses/Orbits: Mild scattered mucosal thickening in the paranasal sinuses. Clear mastoid air cells. Bilateral cataract extraction. Other: None. ASPECTS ([place] - Ganglionic level infarction (caudate, lentiform nuclei, internal capsule, insula, M1-M3  cortex): 7 - Supraganglionic infarction (M4-M6 cortex): 3 Total score (0-10 with 10 being normal): 10 Review of the MIP images confirms the above findings CTA NECK FINDINGS Aortic arch: Normal variant aortic arch branching pattern with common origin of the brachiocephalic and left common carotid arteries. Mild calcified plaque in the aortic arch without evidence of a significant arch vessel origin stenosis. Right carotid system: Patent with mild scattered predominantly calcified plaque throughout the common carotid artery and at the carotid bifurcation. No evidence of a significant stenosis or dissection. Left carotid system: Patent with mild plaque in the mid common carotid artery not resulting in significant stenosis.  More extensive, heavily calcified plaque at the carotid bifurcation results in less than 50% proximal ICA stenosis. No evidence of dissection. Vertebral arteries: There is either severe stenosis or short segment occlusion of the proximal left vertebral artery at its origin with the vessel being patent throughout the remainder of the neck. There is a diffusely thready appearance of the right vertebral artery V1 and V2 segments with the right V3 segment being occluded. Skeleton: Advanced cervical disc and facet degeneration. Other neck: No evidence of cervical lymphadenopathy or mass. Upper chest: Partially visualized small right pleural effusion. No apical lung consolidation. Review of the MIP images confirms the above findings CTA HEAD FINDINGS Anterior circulation: The internal carotid arteries are patent from skull base to carotid termini with calcified plaque resulting in mild cavernous and proximal supraclinoid stenosis bilaterally. The right M1 segment is patent with a mild stenosis proximally. There is a decreased number of distal right MCA branch vessels compared to the left, however a discrete proximal branch occlusion is not identified. The left MCA is patent with branch vessel irregularity  as well as a mild-to-moderate proximal M2 stenosis. Both ACAs are patent without evidence of a significant proximal stenosis. No aneurysm is identified. Posterior circulation: The intracranial left vertebral artery is patent but diffusely irregular and narrowed with multiple moderate to severe stenoses. The right V4 segment is occluded proximally with likely retrograde filling distally supplying the right PICA. 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 Patent SCAs are seen bilaterally. There is a fetal origin of the PCAs without evidence of a significant proximal PCA stenosis. No aneurysm is identified. Venous sinuses: Patent. Anatomic variants: Fetal origin of the PCAs. Review of the MIP images confirms the above findings CT Brain Perfusion Findings: ASPECTS: 10 CBF (<30%) Volume: 0 mL Perfusion (Tmax>6.0s) volume: 55 mL Mismatch Volume: 55 mL Infarction Location: No core infarct evident by CTP. Ischemic penumbra in the right MCA territory, greatest in the frontal lobe at and superior to the operculum though with parietal and temporal lobe involvement as well. IMPRESSION: CT HEAD: 1. No evidence of acute intracranial abnormality.  ASPECTS of 10. 2. Mild chronic small vessel ischemic disease. CTA HEAD AND NECK: 1. Decreased number of right MCA branch vessels without a discrete proximal occlusion identified. It is possible that this reflects a stump occlusion of a proximal to mid M2 branch which is not readily apparent by CTA. 2. Intracranial atherosclerosis with multiple anterior and posterior circulation stenoses as detailed above. 3. 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 4. Severe stenosis or short segment occlusion of the left vertebral artery at its origin with patency of the vessel more distally. Heavily diseased left V4 segment. 5. Cervical carotid atherosclerosis without significant  stenosis. 6.  Aortic Atherosclerosis (ICD10-I70.0). CT BRAIN PERFUSION: 55 mL of ischemic penumbra in the right MCA territory without evidence of a core infarct. These results were called by telephone at the time of interpretation on 04/12/2020 at 2:15 pm to Dr. Donnetta Simpers, who verbally acknowledged these results. Electronically Signed   By: Logan Bores M.D.   On: 04/19/2020 14:58   CT ANGIO NECK CODE STROKE  Result Date: 04/14/2020 CLINICAL DATA:  Slurred speech and left-sided weakness. EXAM: CT ANGIOGRAPHY HEAD AND NECK CT PERFUSION BRAIN TECHNIQUE: Multidetector CT imaging of the head and neck was performed using the standard protocol during bolus administration of intravenous contrast. Multiplanar CT image reconstructions and  MIPs were obtained to evaluate the vascular anatomy. Carotid stenosis measurements (when applicable) are obtained utilizing NASCET criteria, using the distal internal carotid diameter as the denominator. Multiphase CT imaging of the brain was performed following IV bolus contrast injection. Subsequent parametric perfusion maps were calculated using RAPID software. CONTRAST:  44m OMNIPAQUE IOHEXOL 350 MG/ML SOLN COMPARISON:  None. FINDINGS: CT HEAD FINDINGS Brain: There is no evidence of an acute infarct, intracranial hemorrhage, mass, midline shift, or extra-axial fluid collection. Mild cerebral atrophy is within normal limits for age. Patchy hypodensities in the cerebral white matter bilaterally are nonspecific but compatible with mild chronic small vessel ischemic disease. There is a chronic lacunar infarct at the inferior aspect of the right caudate head. Vascular: Calcified atherosclerosis at the skull base. No hyperdense vessel. Skull: No fracture or suspicious osseous lesion. Sinuses/Orbits: Mild scattered mucosal thickening in the paranasal sinuses. Clear mastoid air cells. Bilateral cataract extraction. Other: None. ASPECTS ([place] Early [place] Score) -  Ganglionic level infarction (caudate, lentiform nuclei, internal capsule, insula, M1-M3 cortex): 7 - Supraganglionic infarction (M4-M6 cortex): 3 Total score (0-10 with 10 being normal): 10 Review of the MIP images confirms the above findings CTA NECK FINDINGS Aortic arch: Normal variant aortic arch branching pattern with common origin of the brachiocephalic and left common carotid arteries. Mild calcified plaque in the aortic arch without evidence of a significant arch vessel origin stenosis. Right carotid system: Patent with mild scattered predominantly calcified plaque throughout the common carotid artery and at the carotid bifurcation. No evidence of a significant stenosis or dissection. Left carotid system: Patent with mild plaque in the mid common carotid artery not resulting in significant stenosis. More extensive, heavily calcified plaque at the carotid bifurcation results in less than 50% proximal ICA stenosis. No evidence of dissection. Vertebral arteries: There is either severe stenosis or short segment occlusion of the proximal left vertebral artery at its origin with the vessel being patent throughout the remainder of the neck. There is a diffusely thready appearance of the right vertebral artery V1 and V2 segments with the right V3 segment being occluded. Skeleton: Advanced cervical disc and facet degeneration. Other neck: No evidence of cervical lymphadenopathy or mass. Upper chest: Partially visualized small right pleural effusion. No apical lung consolidation. Review of the MIP images confirms the above findings CTA HEAD FINDINGS Anterior circulation: The internal carotid arteries are patent from skull base to carotid termini with calcified plaque resulting in mild cavernous and proximal supraclinoid stenosis bilaterally. The right M1 segment is patent with a mild stenosis proximally. There is a decreased number of distal right MCA branch vessels compared to the left, however a discrete proximal  branch occlusion is not identified. The left MCA is patent with branch vessel irregularity as well as a mild-to-moderate proximal M2 stenosis. Both ACAs are patent without evidence of a significant proximal stenosis. No aneurysm is identified. Posterior circulation: The intracranial left vertebral artery is patent but diffusely irregular and narrowed with multiple moderate to severe stenoses. The right V4 segment is occluded proximally with likely retrograde filling distally supplying the right PICA. 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 Patent SCAs are seen bilaterally. There is a fetal origin of the PCAs without evidence of a significant proximal PCA stenosis. No aneurysm is identified. Venous sinuses: Patent. Anatomic variants: Fetal origin of the PCAs. Review of the MIP images confirms the above findings CT Brain  Perfusion Findings: ASPECTS: 10 CBF (<30%) Volume: 0 mL Perfusion (Tmax>6.0s) volume: 55 mL Mismatch Volume: 55 mL Infarction Location: No core infarct evident by CTP. Ischemic penumbra in the right MCA territory, greatest in the frontal lobe at and superior to the operculum though with parietal and temporal lobe involvement as well. IMPRESSION: CT HEAD: 1. No evidence of acute intracranial abnormality.  ASPECTS of 10. 2. Mild chronic small vessel ischemic disease. CTA HEAD AND NECK: 1. Decreased number of right MCA branch vessels without a discrete proximal occlusion identified. It is possible that this reflects a stump occlusion of a proximal to mid M2 branch which is not readily apparent by CTA. 2. Intracranial atherosclerosis with multiple anterior and posterior circulation stenoses as detailed above. 3. 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 4. Severe stenosis or short segment occlusion of the left vertebral artery at its origin with patency of the vessel more distally.  Heavily diseased left V4 segment. 5. Cervical carotid atherosclerosis without significant stenosis. 6.  Aortic Atherosclerosis (ICD10-I70.0). CT BRAIN PERFUSION: 55 mL of ischemic penumbra in the right MCA territory without evidence of a core infarct. These results were called by telephone at the time of interpretation on 04/26/2020 at 2:15 pm to Dr. Donnetta Simpers, who verbally acknowledged these results. Electronically Signed   By: Logan Bores M.D.   On: 04/17/2020 14:58    PHYSICAL EXAM  Temp:  [100.4 F (38 C)-102.2 F (39 C)] 102.2 F (39 C) (02/22 1000) Pulse Rate:  [70-123] 111 (02/22 1000) Resp:  [10-38] 16 (02/22 1000) BP: (126-177)/(62-87) 177/87 (02/21 1500) SpO2:  [89 %-100 %] 94 % (02/22 1000) Arterial Line BP: (100-176)/(47-74) 156/65 (02/22 1000) FiO2 (%):  [40 %] 40 % (02/22 0808)  General - Well nourished, well developed, intubated on sedation.  Ophthalmologic - fundi not visualized due to noncooperation.  Cardiovascular - irregularly irregular heart rate and rhythm.  Neuro - On exam, intubated on precedex, stuperous, eyes closed, doesn't open eyes on noxious stimuli . Not  able to follow simple commands except mild wiggling of toes in Rt LE. With forced eye opening, eyes in mid position, not blinking to visual threat, doll's eyes sluggish, not tracking. Corneal reflex weakly present, gag and cough present. Breathing barely over the vent.  Facial symmetry not able to test due to ET tube.  Tongue protrusion not cooperative. Able to wiggle toes Rt LE only. Not able to raise against gravity. Doesn't withdraws on noxious stimuli. DTR diminished and right positive babinski. Sensation, coordination and gait not tested.  ASSESSMENT/PLAN Mr. Denali Becvar is a 80 y.o. male with history of PMHx of DM II, HTN, AF s/p cardioversion in 11/2019 on Xarelto, and HLD transfer from [facility] for significant left arm weakness, slurred speech and left facial droop. Time onset 1200pm. NIHSS = 4.  CT no acute finding. CTA head and neck left ICA bulb and b/l ICA siphon severe athero with stenosis. Possible right M2 branch occlusion. CTP right MCA penumbra. Last dose Xarelto last night. Pt not tPA candidate given on Xarelto. He was transferred to [facility] for EVT.  ECHO showed Overall LVEF is probably normal to mildly decreased. CXR done showed Suspect pneumonia right perihilar region.  Nephro consulted, advised HD Today.  Stroke:  right MCA scattered infarcts and two left ACA punctate infarcts due to left M2 occlusion s/p stenting, more likely large vessel stenosis given plaque occluding left M2. However, also possible due to AF despite on Xarelto  CT no acute finding.  CTA head and neck left ICA bulb and b/l ICA siphon severe athero with stenosis. Questionable right M2 branch occlusion.   CTP right MCA penumbra.  IR - right M2 branch occlusion with plaque - s/p stenting  MRI  Scattered acute cortical infarcts in the right MCA territory and right occipital lobe. Two tiny cortical infarcts in the left cerebral hemisphere.  MRA  Present flow in the stented right M2 branch.  CT head 2/22 pending  2D Echo- Overall LVEF is probably normal to mildly decreased. Need to repeat once stable  LDL < 10  HgbA1c 9.8  Heparin subq for VTE prophylaxis  Xarelto (rivaroxaban) daily prior to admission, now on aspirin 81 mg daily and Brilinta (ticagrelor) 90 mg bid due to stenting. Will resume AC once stable  Ongoing aggressive stroke risk factor management  Therapy recommendations:  pending  Disposition:  pending  Respiratory failure  Intubated for procedure  Not able to extubate after procedure  CCM on board  Cardiac arrest  CHF  Cardiac arrest for 20-30s and ROSC - no CPR needed  Concerning for CHF   CXR showed no Carol edema  TTE Overall LVEF is probably normal to mildly decreased. Need to repeat once stale.  Tele monitoring  Chronic AF   s/p cardioversion in 11/2019   on  Xarelto  Rate controlled  Need to resume AC once stable  Intracranial stenosis  CTA head and neck left ICA bulb and b/l ICA siphon severe athero with stenosis.  Avoid low BP  Long term BP goal 130-150  Diabetes  HgbA1c 9.8 goal < 7.0  Uncontrolled  CBG monitoring  SSI  Hypoglycemia on 2/20- BG-45  On tube feeding now  DM education and close PCP follow up  Hypertension . Fluctuate according to sedation status . BP fluctuate  . Off cleviprex and neo  Long term BP goal 130-150 given intracranial stenosis  Hyperlipidemia  Home meds:  lipitor 80   LDL < 10, goal < 70  on lipitor 80  Continue statin at discharge  AKI ? Vs. CKD ?  Cre 3.00->3.35->3.71>3.93>4.38  Hyperkalemia - 7.2->5.5->5.0>5.4>6.1>Lokelma given  CCM on board  Nephro on Board  Pt will have HD today  Could be related to iodine contrast  On IVF  Fluid Overload  Likely due to worsening renal function  Started on Lasix on 2/21  Lasix Stopped per Nephrology  Pt will have HD Today  Fever UTI pneumonia  100.4>101.8>102.38  Leukocytosis - WBC 16.1->12.2>15.2  CXR- suspected pneumonia right perihilar region.  UA WBC > 50  UA C/s and Bld C/s pending  Put on Rocephin.  Monitor Temp  CCM Following  Other Stroke Risk Factors  Advanced age  Obesity, Body mass index is 32.87 kg/m.   Other Active Problems    Hospital day # 3  ATTENDING NOTE: I reviewed above note and agree with the assessment and plan. Pt was seen and examined.   No family at bedside. CCM NP is planing for HD catheter. Pt overnight developed high grade fever, worsening mental status, worsening renal function with hyperkalemia, nephrology on board for HD. Still has leukocytosis, CXR concerning for pneumonia, will put on empiric rocephin. Still intubated, not candidate for extubation.   General - Well nourished, well developed, intubated on precedex.  Ophthalmologic - fundi not visualized due to  noncooperation.  Cardiovascular - irregularly irregular heart rate and rhythm..  Neuro - intubated on precedex, eyes closed, not following commands. With forced eye opening, eyes in  mid position, not blinking to visual threat, doll's eyes absent, not tracking, pupil 2.63m bilaterally, very sluggish to light. Corneal reflex weakly present on the right but absent on the left, gag and cough present. Breathing over the vent.  Facial symmetry not able to test due to ET tube.  Tongue protrusion not cooperative. On pain stimulation, no movement of all extremities. DTR diminished and no babinski. Sensation, coordination and gait not tested.  Pt less responsive than yesterday, likely more encephalopathic due to fever, worsening renal failure, hyperkalemia, leukocytosis, UTI and pneumonia. Currently still intubated on precedex. Nephrology on board for HD. CCM started empiric Abx rocephin. Appreciate help.   For detailed assessment and plan, please refer to above as I have made changes wherever appropriate.   JRosalin Hawking MD PhD Stroke Neurology 04/28/2020 9:34 PM  This patient is critically ill due to left MCA infarct s/p IR and stenting, fever, UTI, pneumonia, renal failure, respiratory failure, afib and at significant risk of neurological worsening, death form recurrent stroke, hemorrhagic conversion, septic shock, heart failure, cardiac arrest. This patient's care requires constant monitoring of vital signs, hemodynamics, respiratory and cardiac monitoring, review of multiple databases, neurological assessment, discussion with family, other specialists and medical decision making of high complexity. I spent 35 minutes of neurocritical care time in the care of this patient. I discussed with Dr. ALynetta Mare    To contact Stroke Continuity provider, please refer to [email or web address] After hours, contact General Neurology

## 2020-04-28 NOTE — Progress Notes (Signed)
PT Cancellation Note  Patient Details Name: Robert Daniel MRN: [id number] DOB: 08-08-1940   Cancelled Treatment:    Reason Eval/Treat Not Completed: Medical issues which prohibited therapy;Patient not medically ready - As per protocol, PT to sign off after x3 medical cancels in a row. Please re-consult PT when pt is medically appropriate for mobility. Thank you.  Stacie Glaze, PT Acute Rehabilitation Services Pager [phone]  Office [phone]    Louis Matte 04/28/2020, 11:07 AM

## 2020-04-28 NOTE — Care Plan (Addendum)
Paged regarding change in neuro status, patient not following commands.  On my evaluation, examination similar to documented by Dr. Rosita Kea earlier today General: ET tube in place, febrile to 38.4, irregularly irregular rhythm, diffuse anasarca Mental status: Does not follow any commands (per prior examinations best was wiggling the right foot to command earlier today, per nursing was briskly following commands with the right upper extremity yesterday evening) Cranial nerves: Pupils 2 to 1.5 mm bilaterally, no clear blink to threat, sluggish VOR, intact corneals, intact cough, intact gag Sensory/motor: Withdraws the right upper extremity to noxious stimulation, no movement in any of the other extremities  Of note patient has been started on dialysis due to worsening renal function and his temperature has continued to climb since yesterday, with infectious work-up pending.  He is on dual antiplatelet therapy but no anticoagulation at this time given stent for right MCA stenosis.  Suspect toxic/medical encephalopathy, but will obtain repeat head CT to rule out acute intracranial process.  21:00 addendum  Head CT personally reviewed.  Right MCA stroke appears to be demonstrating expected evolution, however there is a subtle hyperdensity concerning for petechial hemorrhage versus trace subarachnoid hemorrhage in the right parietal region, may also represent cortical laminar necrosis.  At this time cannot stop dual antiplatelet therapy due to his stent, and will therefore continue these agents.  Will obtain a follow-up MRI brain at a 6-hour interval to confirm stability.  Additionally will order routine EEG for the morning to assess for potential seizure activity.  1:29 AM addendum:  Blood cultures resulted positive for MSSA x 2 bottles.  Ordering TEE to evaluate for endocarditis  40 minutes spent in the critical care of this patient as documented above, billing per Dr. Erlinda Hong who is primary

## 2020-04-28 NOTE — Progress Notes (Signed)
Occupational Therapy Discharge Patient Details Name: Robert Daniel MRN: [id number] DOB: 1940/09/01 Today's Date: 04/28/2020 Time:  -     Patient discharged from OT services secondary to orders d/c at this time. Ot to sign off.  Please see latest therapy progress note for current level of functioning and progress toward goals.    Progress and discharge plan discussed with patient and/or caregiver: Patient unable to participate in discharge planning and no caregivers available  GO     Jeri Modena 04/28/2020, 9:12 AM

## 2020-04-29 ENCOUNTER — Inpatient Hospital Stay (HOSPITAL_COMMUNITY): Payer: PPO

## 2020-04-29 DIAGNOSIS — R4182 Altered mental status, unspecified: Secondary | ICD-10-CM

## 2020-04-29 DIAGNOSIS — R0902 Hypoxemia: Secondary | ICD-10-CM | POA: Diagnosis not present

## 2020-04-29 DIAGNOSIS — N179 Acute kidney failure, unspecified: Secondary | ICD-10-CM | POA: Diagnosis not present

## 2020-04-29 DIAGNOSIS — I63411 Cerebral infarction due to embolism of right middle cerebral artery: Secondary | ICD-10-CM | POA: Diagnosis not present

## 2020-04-29 DIAGNOSIS — Z978 Presence of other specified devices: Secondary | ICD-10-CM | POA: Diagnosis not present

## 2020-04-29 DIAGNOSIS — I639 Cerebral infarction, unspecified: Secondary | ICD-10-CM

## 2020-04-29 DIAGNOSIS — J9601 Acute respiratory failure with hypoxia: Secondary | ICD-10-CM | POA: Diagnosis not present

## 2020-04-29 DIAGNOSIS — I340 Nonrheumatic mitral (valve) insufficiency: Secondary | ICD-10-CM

## 2020-04-29 LAB — BASIC METABOLIC PANEL
Anion gap: 17 — ABNORMAL HIGH (ref 5–15)
BUN: 127 mg/dL — ABNORMAL HIGH (ref 8–23)
CO2: 19 mmol/L — ABNORMAL LOW (ref 22–32)
Calcium: 7.3 mg/dL — ABNORMAL LOW (ref 8.9–10.3)
Chloride: 108 mmol/L (ref 98–111)
Creatinine, Ser: 4.46 mg/dL — ABNORMAL HIGH (ref 0.61–1.24)
GFR, Estimated: 13 mL/min — ABNORMAL LOW (ref >60)
Glucose, Bld: 73 mg/dL (ref 70–99)
Potassium: 5 mmol/L (ref 3.5–5.1)
Sodium: 144 mmol/L (ref 135–145)

## 2020-04-29 LAB — BPAM RBC
Blood Product Expiration Date: 202203072359
Blood Product Expiration Date: 202203132359
ISSUE DATE / TIME: 202202191717
ISSUE DATE / TIME: 202202191717
Unit Type and Rh: 6200
Unit Type and Rh: 6200

## 2020-04-29 LAB — ECHO TEE
AR max vel: 2.84 cm2
AV Area VTI: 3.02 cm2
AV Area mean vel: 2.71 cm2
AV Mean grad: 4 mmHg
AV Peak grad: 7.2 mmHg
Ao pk vel: 1.34 m/s

## 2020-04-29 LAB — BLOOD CULTURE ID PANEL (REFLEXED) - BCID2

## 2020-04-29 LAB — CBC
HCT: 27.1 % — ABNORMAL LOW (ref 39.0–52.0)
Hemoglobin: 8.3 g/dL — ABNORMAL LOW (ref 13.0–17.0)
MCH: 27.4 pg (ref 26.0–34.0)
MCHC: 30.6 g/dL (ref 30.0–36.0)
MCV: 89.4 fL (ref 80.0–100.0)
Platelets: 146 10*3/uL — ABNORMAL LOW (ref 150–400)
RBC: 3.03 MIL/uL — ABNORMAL LOW (ref 4.22–5.81)
RDW: 15.9 % — ABNORMAL HIGH (ref 11.5–15.5)
WBC: 16.4 10*3/uL — ABNORMAL HIGH (ref 4.0–10.5)
nRBC: 0.3 % — ABNORMAL HIGH (ref 0.0–0.2)

## 2020-04-29 LAB — TYPE AND SCREEN
ABO/RH(D): A POS
Antibody Screen: NEGATIVE
Unit division: 0
Unit division: 0

## 2020-04-29 LAB — GLUCOSE, CAPILLARY
Glucose-Capillary: 127 mg/dL — ABNORMAL HIGH (ref 70–99)
Glucose-Capillary: 108 mg/dL — ABNORMAL HIGH (ref 70–99)
Glucose-Capillary: 45 mg/dL — ABNORMAL LOW (ref 70–99)
Glucose-Capillary: 30 mg/dL — CL (ref 70–99)
Glucose-Capillary: 28 mg/dL — CL (ref 70–99)
Glucose-Capillary: 56 mg/dL — ABNORMAL LOW (ref 70–99)
Glucose-Capillary: 77 mg/dL (ref 70–99)
Glucose-Capillary: 71 mg/dL (ref 70–99)
Glucose-Capillary: 67 mg/dL — ABNORMAL LOW (ref 70–99)

## 2020-04-29 LAB — TRIGLYCERIDES: Triglycerides: 96 mg/dL (ref <150)

## 2020-04-29 LAB — HEPATITIS B E ANTIBODY: Hep B E Ab: NEGATIVE

## 2020-04-29 IMAGING — DX DG ABD PORTABLE 1V
1 series · 1 of 1 positions shown · non-contrast
Comparison: [DATE].

CLINICAL DATA: Nasogastric tube placement.

EXAM:
PORTABLE ABDOMEN - 1 VIEW

[abdomen]
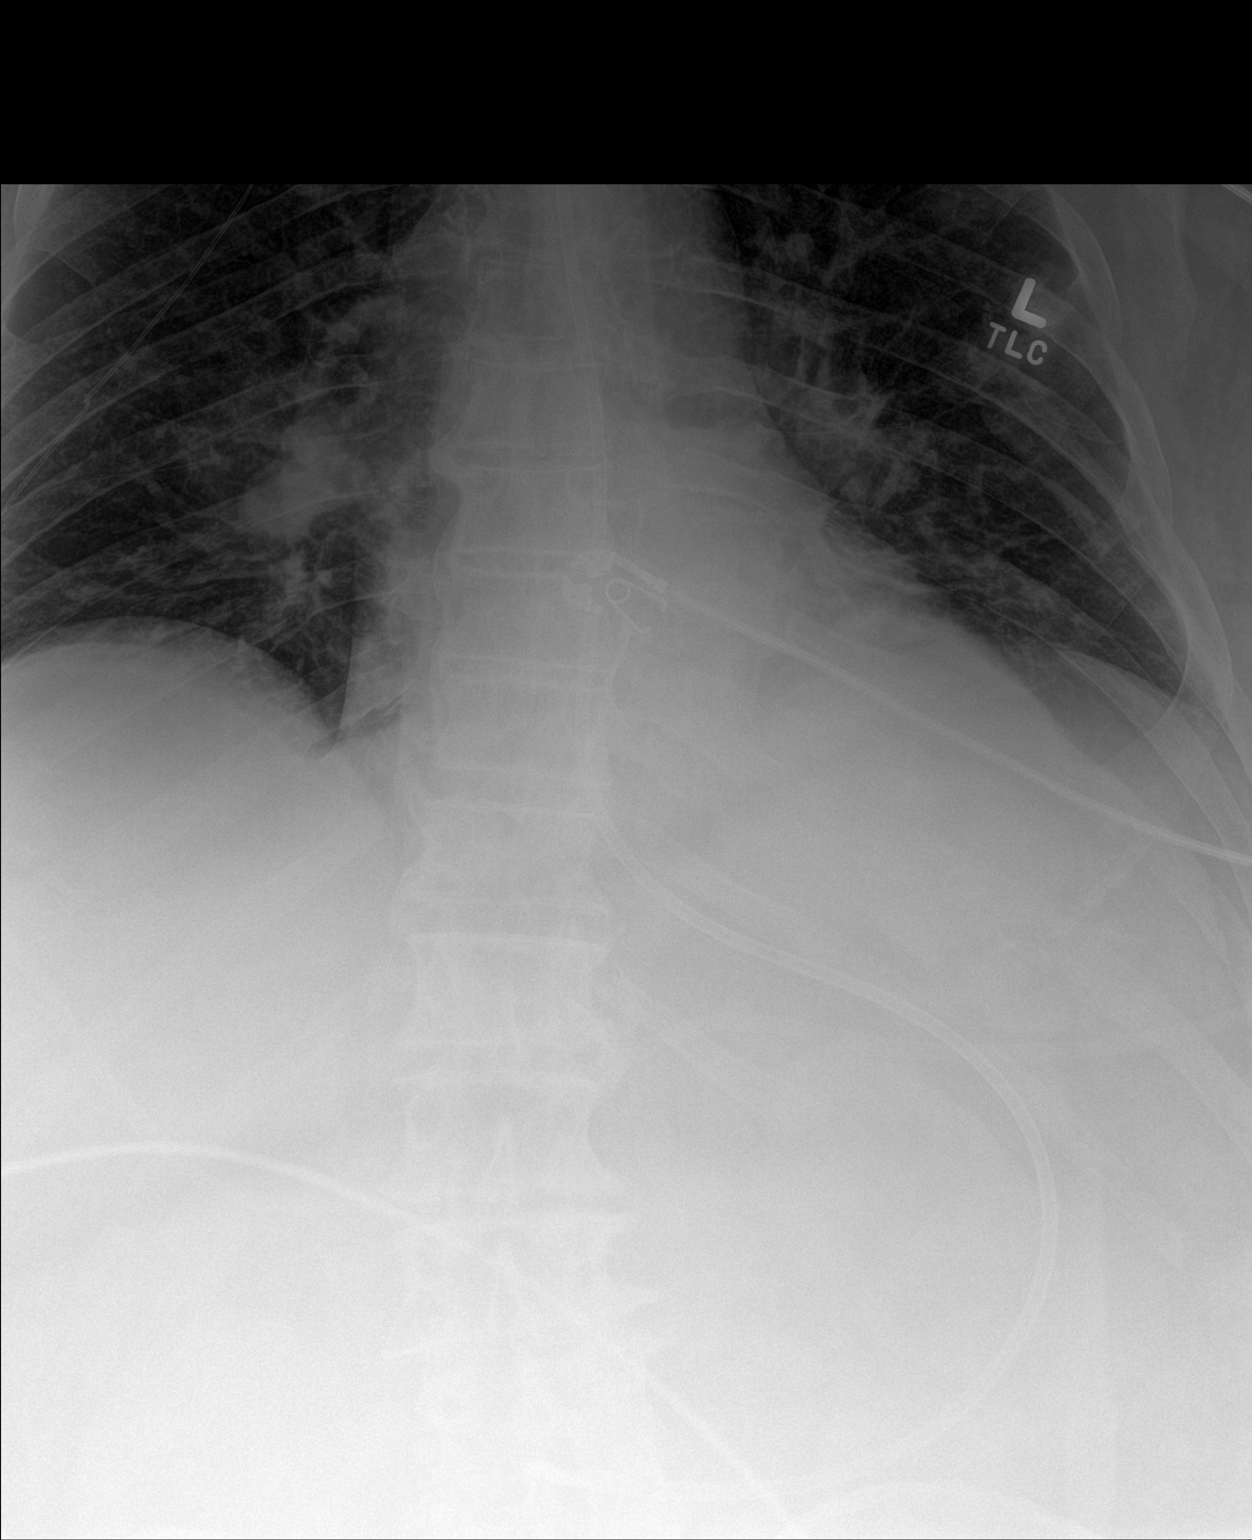

[1 of 1 positions shown; findings below may reference images not displayed]

FINDINGS: The bowel gas pattern is normal. Feeding tube tip is seen in
expected position of distal stomach. No radio-opaque calculi or
other significant radiographic abnormality are seen.
IMPRESSION: Feeding tube tip seen in expected position of distal stomach. No
evidence of bowel obstruction or ileus.

## 2020-04-29 IMAGING — DX DG CHEST 1V PORT
1 series · 1 of 1 positions shown · non-contrast
Comparison: [DATE].

CLINICAL DATA: Renal failure.

EXAM:
PORTABLE CHEST 1 VIEW

[chest]
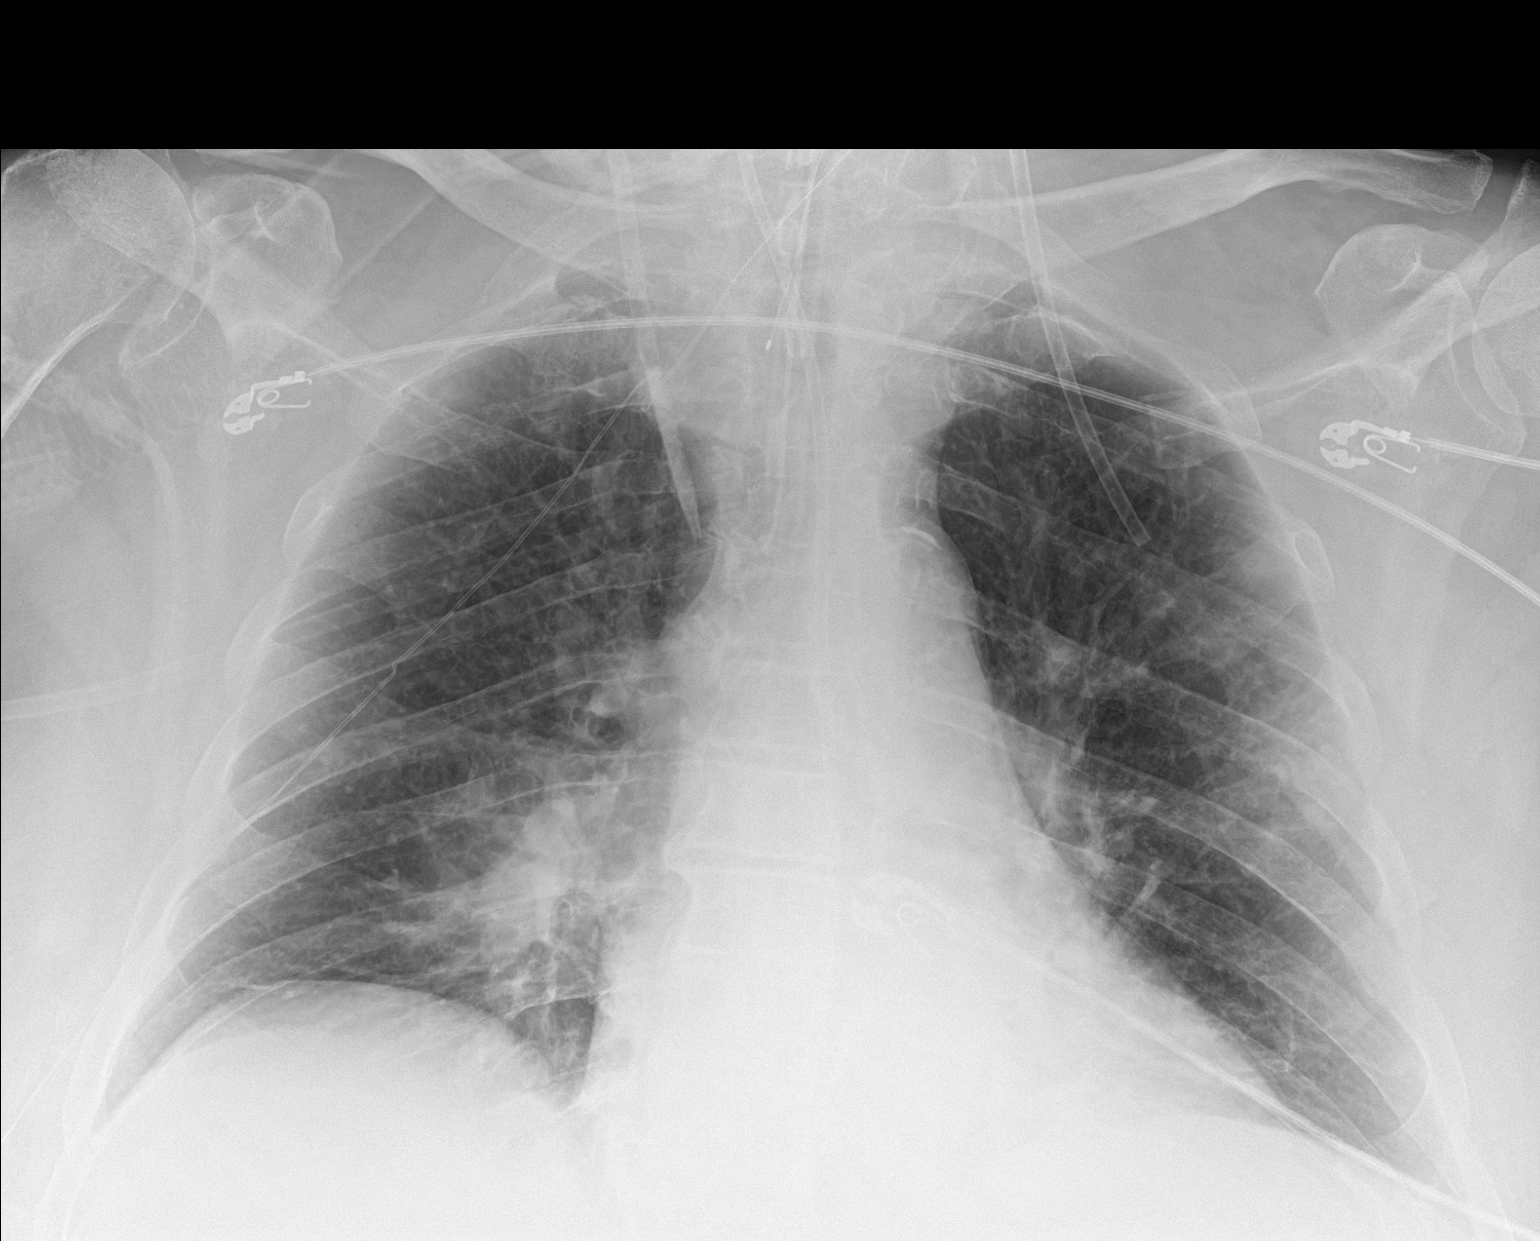

[1 of 1 positions shown; findings below may reference images not displayed]

FINDINGS: Stable cardiomediastinal silhouette. Endotracheal and feeding tubes
are unchanged in position. Right internal jugular catheter is
unchanged. No pneumothorax or pleural effusion is noted. New mild
opacity is seen involving peripheral left midlung concerning for
possible pneumonia. Mild right basilar subsegmental atelectasis is
noted. Bony thorax is unremarkable.
IMPRESSION: Stable support apparatus. New mild opacity is seen involving
peripheral left midlung concerning for possible pneumonia.

## 2020-04-29 IMAGING — MR MR MRA HEAD W/O CM
9 of 15 series · 25 of 48 positions shown · non-contrast
Comparison: Brain MRI and MRA [DATE].  Head CT yesterday.

CLINICAL DATA: 79-year-old male with altered mental status.
Scattered infarcts in the right hemisphere, occasionally in the left
hemisphere on [DATE]. History of right MCA stent. Questionable
petechial or subarachnoid hemorrhage along the right superior
convexity on head CT yesterday.

EXAM:
MRI HEAD WITHOUT CONTRAST
MRA HEAD WITHOUT CONTRAST
TECHNIQUE: Multiplanar, multiecho pulse sequences of the brain and surrounding
structures were obtained without intravenous contrast. Angiographic
images of the head were obtained using MRA technique without
contrast.

[Series 2: DWI · axial · 3.0mm · 0.94mm/px · z∈[-53,+89]mm · 5 of 100 slices shown (1 of 4)]
[im 1/100]
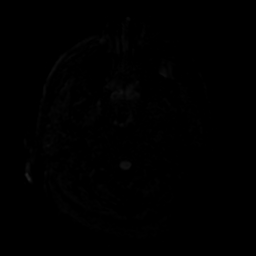
[im 25/100]
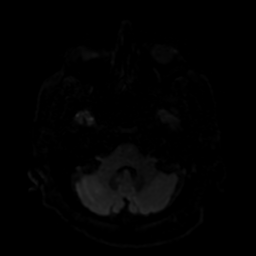
[im 50/100]
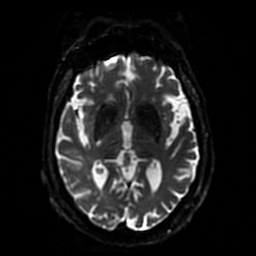
[im 75/100]
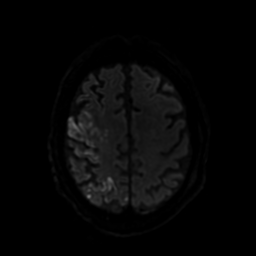
[im 100/100]
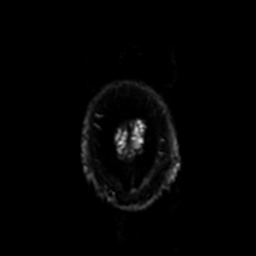

[Series 3: DWI · coronal · 4.0mm · 0.94mm/px · 4 of 72 slices shown (2 of 4)]
[im 1/72]
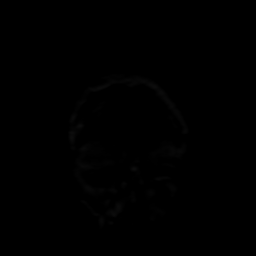
[im 24/72]
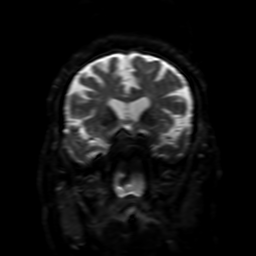
[im 48/72]
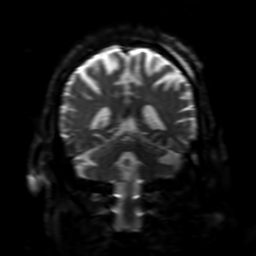
[im 72/72]
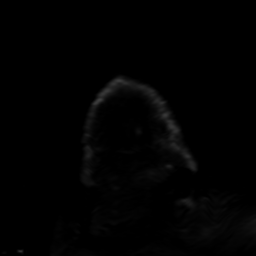

[Series 5: FLAIR · sagittal · 5.0mm · 0.23mm/px · 1 of 24 slices shown (1 of 2)]
[im 1/24]
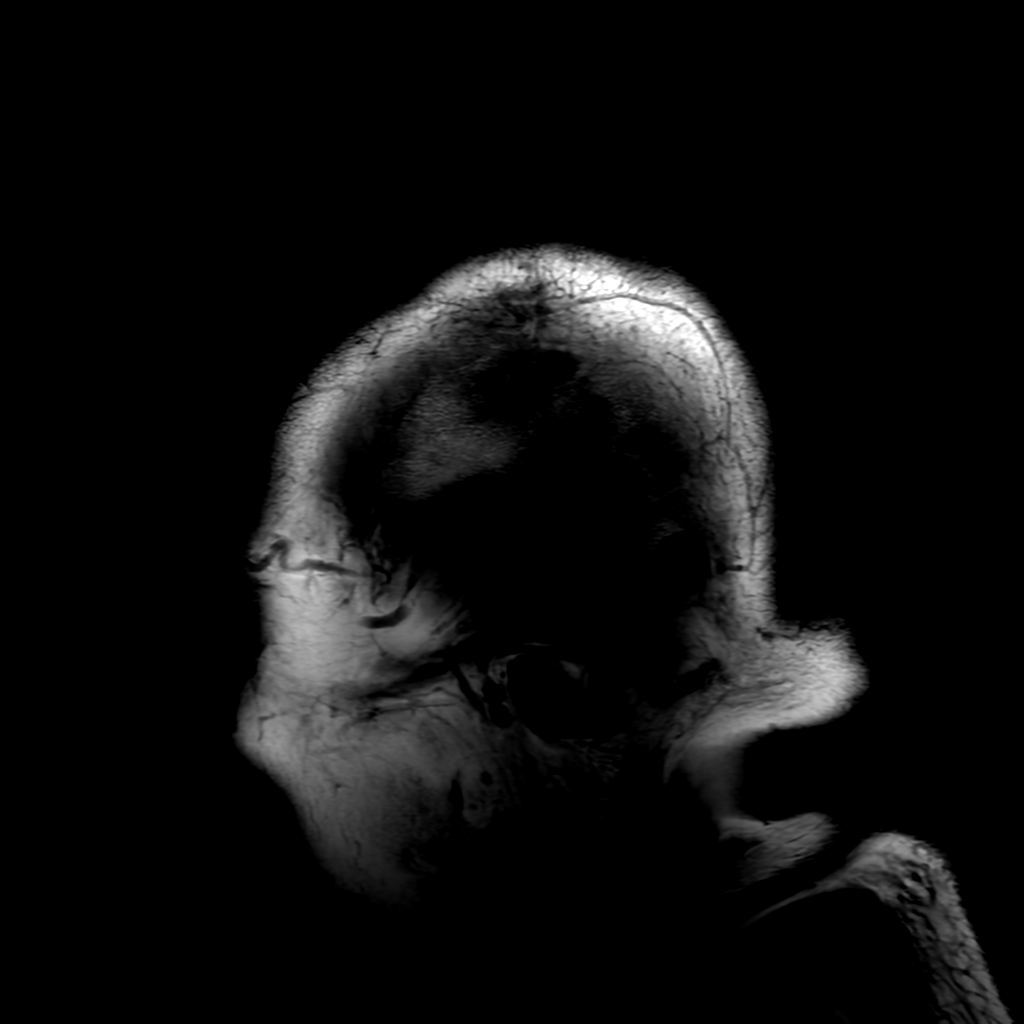

[Series 6: T2 · axial · 5.0mm · 0.23mm/px · 1 of 25 slices shown]
[im 1/25]
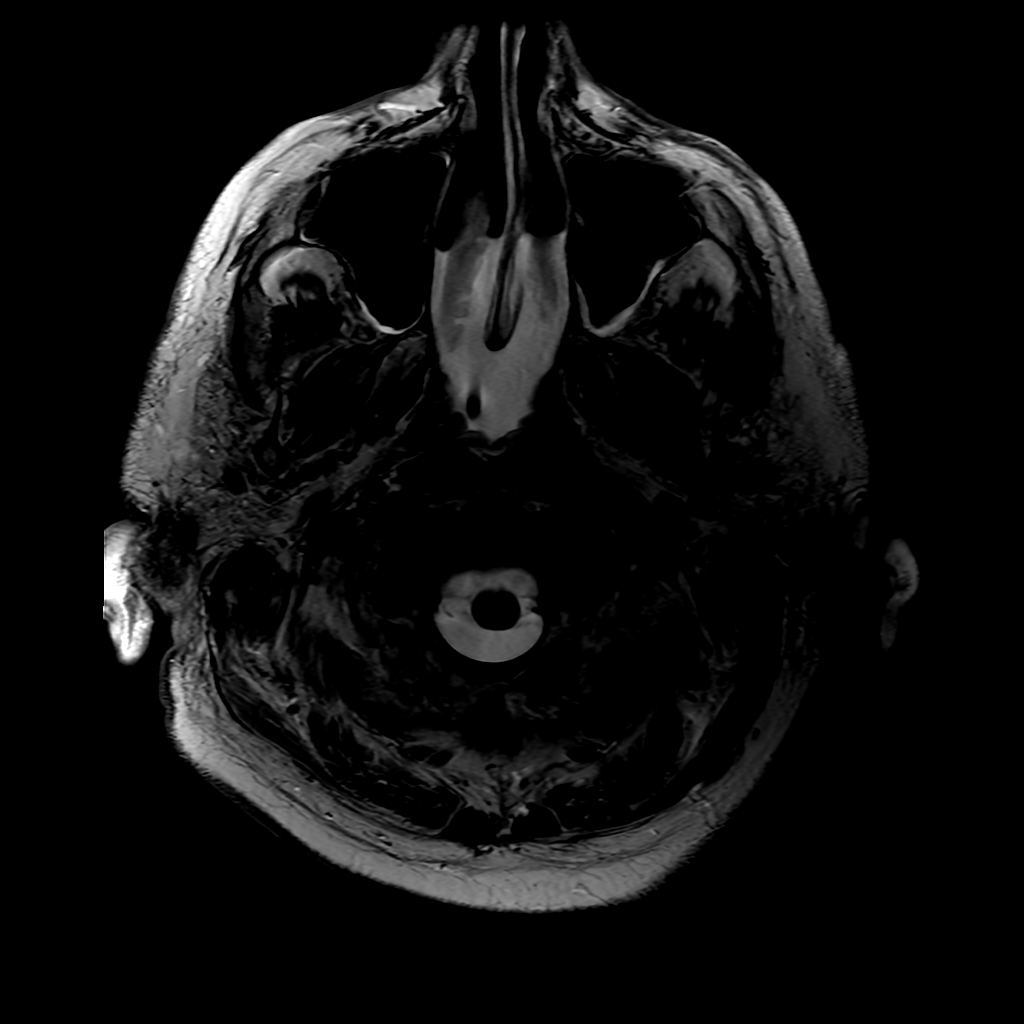

[Series 7: FLAIR · axial · 3.0mm · 0.47mm/px · 1 of 25 slices shown (2 of 2)]
[im 1/25]
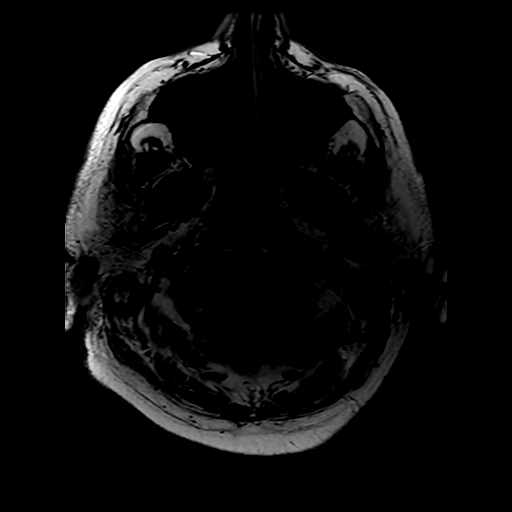

[Series 210: DWI · axial · 3.0mm · 0.94mm/px · z∈[-53,+89]mm · 5 of 100 slices shown (3 of 4)]
[im 1/100]
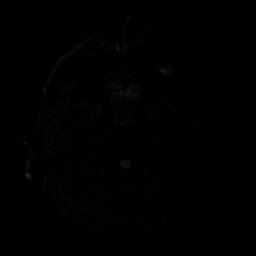
[im 25/100]
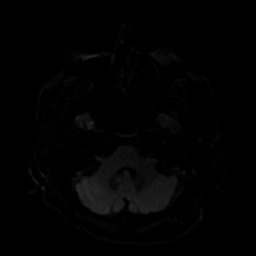
[im 50/100]
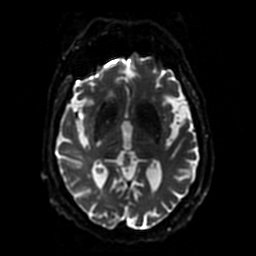
[im 75/100]
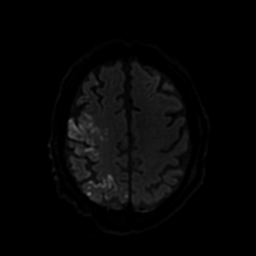
[im 100/100]
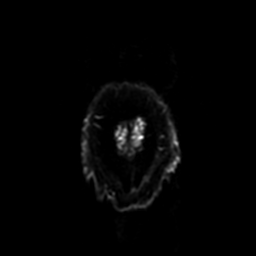

[Series 250: ADC · axial · 3.0mm · 0.94mm/px · z∈[-53,+89]mm · 2 of 50 slices shown (1 of 2)]
[im 1/50]
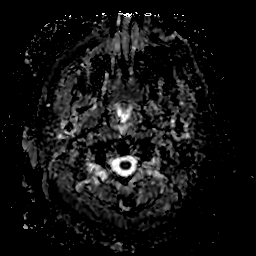
[im 50/50]
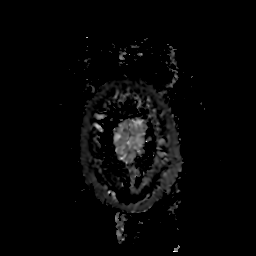

[Series 310: DWI · coronal · 4.0mm · 0.94mm/px · 4 of 72 slices shown (4 of 4)]
[im 1/72]
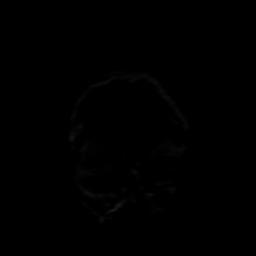
[im 24/72]
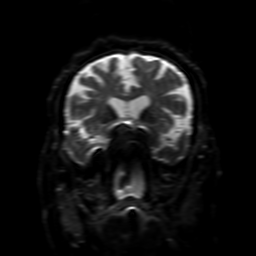
[im 48/72]
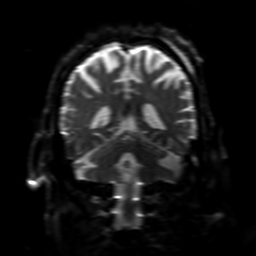
[im 72/72]
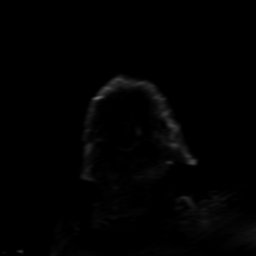

[Series 350: ADC · coronal · 4.0mm · 0.94mm/px · 2 of 36 slices shown (2 of 2)]
[im 1/36]
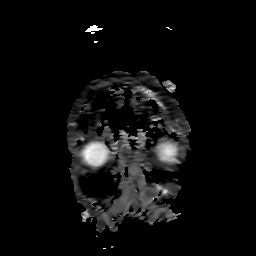
[im 36/36]
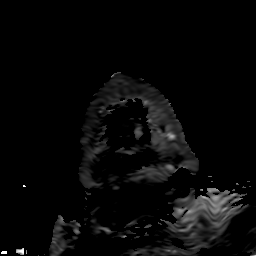

[25 of 48 positions shown; findings below may reference images not displayed]

FINDINGS: MRI HEAD FINDINGS

Brain: Increased size and confluence of multifocal restricted
diffusion, now moderately affecting the right hemisphere in both
right MCA and right PCA vascular territories. Confluent right corona
radiata and frontal operculum progression (series 2, image 34).
Similar increased conspicuity of small foci along the right internal
capsule. More extensive cortical involvement elsewhere including at
the left occipital pole, junction of the right temporal and
occipital lobes.

A few small scattered areas of cortical restricted diffusion in the
left superior and posterior frontal lobe are also mildly increased
(e.g. Series 2, image 40).

There is also a small round new focus of restricted diffusion in the
central midbrain to the right (series 2, image 21). And furthermore
increased conspicuity of a similar small focus of restriction in the
left cerebellar vermis (image 12). No other new areas of involvement
identified.

Progressed cytotoxic edema associated with the above. Mild new mass
effect on the right lateral ventricle. No midline shift. Basilar
cisterns remain patent.

There is punctate but increased petechial or subarachnoid hemorrhage
along the right central sulcus as suspected by CT (series 8, image
70 on SWI). Stable several chronic microhemorrhages in the opposite
left frontal lobe. Susceptibility artifact related to posterior
right MCA branch stent.

No intraventricular hemorrhage or ventriculomegaly. Cervicomedullary
junction and pituitary are within normal limits.

Vascular: Major intracranial vascular flow voids remain stable.

Skull and upper cervical spine: Stable, negative for age.

Sinuses/Orbits: Stable and negative orbits. Fluid in the nasopharynx
with right nasoenteric tube in place now. Stable fluid layering in
the sphenoid sinuses. Mild paranasal sinus mucosal thickening
elsewhere. New left greater than right mastoid fluid.

Other: None.

MRA HEAD FINDINGS

Source images demonstrate stable antegrade flow signal compared to
[DATE] throughout the anterior and posterior circulation.

Diminutive vertebrobasilar system, somewhat dominant left vertebral
artery. Less of the V4 segment is visible today. Distal vertebral
and basilar artery irregularity but only mild associated stenosis.
Basilar functionally terminates in the SCA is. Fetal type bilateral
PCA origins and bilateral PCAs appears stable and within normal
limits.

Stable ICA siphons, mild tortuosity and irregularity without ICA
stenosis. Ophthalmic and posterior communicating artery origins
remain patent. Patent carotid termini. ACAs are stable and within
normal limits, left A1 mildly dominant. Normal anterior
communicating artery. Left MCA M1 segment, left MCA bifurcation and
visible left MCA branches are stable and within normal limits.

Right MCA M1 remains patent. Patent right MCA bifurcation and
proximal M2 branches. Right MCA M2 vascular stent susceptibility and
visible right MCA branches appear stable.
IMPRESSION: 1. Progressed size and extent of infarcts widely scattered in the
right MCA and PCA territories since [DATE]. Associated petechial
hemorrhage as suspected by CT. No malignant hemorrhagic
transformation. Mild mass effect with no midline shift at this time.

2. Small new lacunar type infarcts in the left cerebellar vermis and
right midbrain. Several small left MCA territory cortical infarcts
again noted.

3. Stable intracranial MRA from 2 days ago. No large vessel
occlusion. Stable MRA appearance of right MCA M2 stent.

## 2020-04-29 MED ORDER — PROPOFOL 500 MG/50ML IV EMUL
INTRAVENOUS | Status: AC
  Administered 2020-04-29: 30 mg via INTRAVENOUS
  Filled 2020-04-29: qty 50

## 2020-04-29 MED ORDER — FENTANYL CITRATE (PF) 100 MCG/2ML IJ SOLN: 100.0000 ug | Freq: Once | INTRAMUSCULAR | Status: AC

## 2020-04-29 MED ORDER — PROPOFOL 10 MG/ML IV BOLUS: 30.0000 mg | Freq: Once | INTRAVENOUS | Status: AC

## 2020-04-29 MED ORDER — NOREPINEPHRINE 4 MG/250ML-% IV SOLN
0.0000 ug/min | INTRAVENOUS | Status: DC
  Administered 2020-04-30: 10 ug/min via INTRAVENOUS
  Administered 2020-04-30: 14 ug/min via INTRAVENOUS
  Administered 2020-04-30: 8 ug/min via INTRAVENOUS
  Administered 2020-04-30: 10 ug/min via INTRAVENOUS
  Administered 2020-04-30: 15 ug/min via INTRAVENOUS
  Administered 2020-05-01: 14 ug/min via INTRAVENOUS
  Administered 2020-05-01: 10 ug/min via INTRAVENOUS
  Administered 2020-05-01: 15 ug/min via INTRAVENOUS
  Filled 2020-04-29 (×8): qty 250

## 2020-04-29 MED ORDER — HEPARIN SODIUM (PORCINE) 1000 UNIT/ML DIALYSIS
1000.0000 [IU] | INTRAMUSCULAR | Status: DC | PRN
Start: 2020-04-29 — End: 2020-04-30
  Administered 2020-04-29: 3000 [IU]
  Filled 2020-04-29: qty 3
  Filled 2020-04-29 (×3): qty 6

## 2020-04-29 MED ORDER — NAFCILLIN SODIUM 2 G IJ SOLR: 2.0000 g | INTRAMUSCULAR | Status: DC

## 2020-04-29 MED ORDER — DEXTROSE 50 % IV SOLN
1.0000 | Freq: Once | INTRAVENOUS | Status: AC
  Administered 2020-04-29: 50 mL via INTRAVENOUS

## 2020-04-29 MED ORDER — HEPARIN SODIUM (PORCINE) 1000 UNIT/ML IJ SOLN
INTRAMUSCULAR | Status: AC
  Administered 2020-04-29: 2500 [IU]
  Filled 2020-04-29: qty 3

## 2020-04-29 MED ORDER — NOREPINEPHRINE 4 MG/250ML-% IV SOLN
INTRAVENOUS | Status: AC
  Administered 2020-04-29: 2 ug/min via INTRAVENOUS
  Filled 2020-04-29: qty 250

## 2020-04-29 MED ORDER — DEXTROSE 50 % IV SOLN: 1.0000 | Freq: Once | INTRAVENOUS | Status: AC

## 2020-04-29 MED ORDER — FUROSEMIDE 10 MG/ML IJ SOLN
80.0000 mg | Freq: Once | INTRAMUSCULAR | Status: AC
  Administered 2020-04-29: 80 mg via INTRAVENOUS
  Filled 2020-04-29: qty 8

## 2020-04-29 MED ORDER — PHENYLEPHRINE 200 MCG/ML (NON-ED) FOR PRIAPISM
160.0000 ug | Freq: Once | INTRAMUSCULAR | Status: AC
  Administered 2020-04-29: 160 ug via INTRACAVERNOUS

## 2020-04-29 MED ORDER — SODIUM CHLORIDE 0.9 % IV SOLN
12.0000 g | INTRAVENOUS | Status: DC
  Administered 2020-04-29 – 2020-05-01 (×3): 12 g via INTRAVENOUS
  Filled 2020-04-29 (×4): qty 12000

## 2020-04-29 MED ORDER — DEXTROSE 50 % IV SOLN
INTRAVENOUS | Status: AC
  Filled 2020-04-29: qty 50

## 2020-04-29 MED ORDER — DEXTROSE 50 % IV SOLN
INTRAVENOUS | Status: AC
  Administered 2020-04-29: 50 mL via INTRAVENOUS
  Filled 2020-04-29: qty 50

## 2020-04-29 MED ORDER — DEXTROSE 10 % IV SOLN: INTRAVENOUS | Status: DC

## 2020-04-29 MED ORDER — DEXTROSE 50 % IV SOLN
12.5000 g | Freq: Once | INTRAVENOUS | Status: AC
  Administered 2020-04-29: 12.5 g via INTRAVENOUS

## 2020-04-29 MED ORDER — FENTANYL CITRATE (PF) 100 MCG/2ML IJ SOLN
INTRAMUSCULAR | Status: AC
  Administered 2020-04-29: 100 ug
  Filled 2020-04-29: qty 2

## 2020-04-29 NOTE — Progress Notes (Signed)
[facility] Progress Note  Name: Robert Daniel MRN: [id number] DOB: [birth year]  Chief Complaint:  weakness  Subjective:  Started on HD on 2/22 after nontunneled catheter with critical care.   found to have bacteremia.  Net UF was zero despite trying to gently UF. Had 690 mL UOP over 2/22.   Review of systems:  Unable to obtain 2/2 intubated   -------------- Background on consult:  Samual Daniel is a 80 y.o. male with a history of HTN, CKD, DM, and atrial fibrillation who presented with weakness and slurred speech, and left facial droop.  Found to have right MCA CVA and transferred to [facility].  He is s/p thrombectomy.  Course has been complicated by renal failure with hypotension, pause vs asystole per charting with spontaneous ROSC per charting.  Nephrology is consulted for hyperkalemia and volume overload - has been on lasix gtt augmented by metolazone.  Yesterday with 1.4 liters UOP over 2/21. Spoke to his sister, [name] via phone.  We discussed risks/benefits/indications for dialysis and she does consent to proceed with dialysis and dialysis catheter placement.  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  Critical care ok with HD.  Per nursing it's been harder to get him to follow commands today.    Intake/Output Summary (Last 24 hours) at 04/29/2020 0908 Last data filed at 04/29/2020 0800 Gross per 24 hour  Intake 1803.28 ml  Output 440 ml  Net 1363.28 ml    Vitals:  Vitals:   04/29/20 0600 04/29/20 0700 04/29/20 0732 04/29/20 0800  BP:   (!) 125/53   Pulse: (!) 112 (!) 104 (!) 120 (!) 107  Resp: (!) 21 (!) 22 (!) 22 16  Temp:  98.78 F (37.1 C)  99.14 F (37.3 C)  TempSrc:      SpO2: 96% 97% 100% 96%  Weight:      Height:         Physical Exam:  General: elderly male intubated  HEENT: NCAT Neck: trachea midline; increased neck circumference  Heart: tachy S1S2 no rub Lungs: coarse breath sounds on vent 40 fi02/10  PEEP Abdomen: soft/obese/nt with sedation Extremities: 1+ edema diffusely Skin: no rash on extremities exposed Neuro: sedation currently running Access RIJ nontunneled catheter  Medications reviewed    Labs:  BMP Latest Ref Rng & Units 04/29/2020 04/28/2020 04/28/2020  Glucose 70 - 99 mg/dL 73 - -  BUN 8 - 23 mg/dL 127(H) - -  Creatinine 0.61 - 1.24 mg/dL 4.46(H) - -  BUN/Creat Ratio 10 - 24 - - -  Sodium 135 - 145 mmol/L 144 - -  Potassium 3.5 - 5.1 mmol/L 5.0 5.8(H) 6.2(H)  Chloride 98 - 111 mmol/L 108 - -  CO2 22 - 32 mmol/L 19(L) - -  Calcium 8.9 - 10.3 mg/dL 7.3(L) - -     Assessment/Plan:   # AKI - secondary to ischemic ATN and pre-renal insults - stroke, contrast, pause vs asystole reported.  Note presented with AKI from last baseline - note home meds include ACE inhibitor, ibuprofen, torsemide.  UA neg protein and > 50 RBC's noted. Up/cr ratio 1220 mg/g.  Renal US large kidneys no hydro  - HD again today. Then plan for next tx on 2/25 - lasix as well while awaiting HD   # Hyperkalemia  - improved with HD   # Metabolic acidosis  - setting of AKI - for HD  # CVA - acute - s/p thrombectomy - per primary team   #  Staph aureus bacteremia  - nontunneled catheter just placed - abx per primary team  # HTN  - discussed with critical care.  Stopping hydralazine to allow UF with HD  # Anemia acute illness - no ESA setting of acute CVA - PRBC's for goal of 7 (though 8 preferred given new AKI)  # CKD stage 3 - previous baseline Cr 1.4 last fall - presented with AKI from that baseline  # Urine leuks  - urine culture was ordered  Claudia Desanctis, MD 04/29/2020 9:21 AM

## 2020-04-29 NOTE — CV Procedure (Addendum)
INDICATIONS: Stroke, septic emboli  PROCEDURE:   Informed consent was obtained prior to the procedure. The risks, benefits and alternatives for the procedure were discussed and the patient comprehended these risks.  Risks include, but are not limited to, cough, sore throat, vomiting, nausea, somnolence, esophageal and stomach trauma or perforation, bleeding, low blood pressure, aspiration, pneumonia, infection, trauma to the teeth and death.    After a procedural time-out, the oropharynx was anesthetized with 20% benzocaine spray.   During this procedure the patient was administered propofol per Dr. Lynetta Mare PCCM.  The patient's heart rate, blood pressure, and oxygen saturation were monitored continuously during the procedure. The period of conscious sedation was 30 minutes, of which I was present face-to-face 100% of this time.  The transesophageal probe was inserted in the esophagus and stomach without difficulty and multiple views were obtained.  The patient was kept under observation until the patient left the procedure room.  The patient left the procedure room in stable condition.   Agitated microbubble saline contrast was administered.  COMPLICATIONS:    There were no immediate complications.  FINDINGS:   FORMAL ECHOCARDIOGRAM REPORT PENDING Tricuspid valve endocarditis. 1.8 x 0.9 cm lesions likely on anterior tricuspid valve leaflet, likely adherent to atrial aspect of valve.   LVEF 45%  RV mildly decreased and mildly enlarged.   No LA appendage thrombus. Severe atherosclerosis in descending aorta and aortic arch.  No definite PFO or atrial level shunt.   RECOMMENDATIONS:    Consider ID involvement for tricuspid valve endocarditis.   Time Spent Directly with the Patient:  45 minutes   Robert Daniel 04/29/2020, 2:26 PM

## 2020-04-29 NOTE — Progress Notes (Signed)
Hypoglycemic Event  CBG: 23  Treatment: D50 50 mL (25 gm)  Follow-up CBG: Time:1240 CBG Result:  Possible Reasons for Event: Other  Comments/MD notified: Dr. Lynetta Mare notified    Ninfa Meeker

## 2020-04-29 NOTE — Progress Notes (Signed)
NAME:  Robert Daniel, MRN:  [id number], DOB:  09-27-1940, LOS: 4 ADMISSION DATE:  05/03/2020, CONSULTATION DATE:  04/27/2020 REFERRING MD:  Erlinda Hong - neuro, CHIEF COMPLAINT:  R MCA CVA, respiratory failure  Brief History:  80 yo R MCA CVA, s/p stent in NIR, remains intubated  History of Present Illness:  80 yo M PMH Afib on xarelto, HTN, DM2, rpesented to [facility] 2/19 with weakness and slurred speech. Sx began 2/18, but resolved, until 2/19 approx noon when he had L arm weakness, slurred speech, L facial droop. Last took xarelto 2/18 evening. Found to have R MCA CVA Transferred to [facility], and taken urgently to NIR for mechanical thrombectomy of proximal R M2/MCA occlusion, with subsequent reocclusion x 3, followed by intracranial stent deployment with TICI3 recanalization. NIR course also c/b pt instability-- with hypotension,  pause vs asystole with spontaneous ROSC following a prop bolus. He received 2 PRBC in NIR for hypotension; as well as albumin and neo. Remains intubated, transferring to ICU. Received repeat paralytic prior to ICU transfer   Initial labs Na 137 K 4.8 Cr 3 Hgb 8.1 BNP 1653, PCT 2.2, WBC 16.8, INR 2.2  On iSTAT 4 hours later, K 7.2, Hgb 6.8   PCCM consulted in this setting  Past Medical History:  HTN Afib HLD DM2  Significant Hospital Events:  2/19 [facility] ED as code stroke. R MCA CVA. Transferred to [facility] for NIR. Brief asystolic arrest with spontaneous ROSC vs pause in NIR. Remains intubated 2/19 Mechanical thrombectomy right M2 occlusion with TICI-3 flow.  Consults:  NIR PCCM  Procedures:  2/19 ETT> 2/19 Art line>  2/22 temporary right HD catheter.  Significant Diagnostic Tests:  2/19 CTA H> R MCA proximal occlusion   2/23 Progressed size and extent of infarcts widely scattered in the right MCA and PCA territories since 04/26/2020. Associated petechial hemorrhage as suspected by CT. No malignant hemorrhagic transformation. Mild mass effect with no midline shift at this  time.  Small new lacunar type infarcts in the left cerebellar vermis and right midbrain. Several small left MCA territory cortical infarcts again noted.  Stable intracranial MRA from 2 days ago. No large vessel occlusion. Stable MRA appearance of right MCA M2 stent.  2/23 TEE: normal LV, RV function, trace AI, mild MR, mild TR. Tricuspid valve endocarditis.  Micro Data:  COVID-19 neg 2/22 - Blood and urine cultures positive for MSSA  Antimicrobials:  2/22 ceftriaxone >> 2/23 nafcillin   Interim History / Subjective:   Slow to arouse following thrombectomy. Started on HD yesterday but wasn't able to tolerate fluid removal. Antihypertensives decreased by Nephrology. Now growing MSSA in blood cultures. Follow up MRI  Objective   Blood pressure (!) 125/50, pulse (!) 112, temperature 99.32 F (37.4 C), resp. rate (!) 23, height '6\' 3"'$  (1.905 m), weight (!) 144 kg, SpO2 98 %.    Vent Mode: PRVC FiO2 (%):  [40 %] 40 % Set Rate:  [22 bmp] 22 bmp Vt Set:  [630 mL] 630 mL PEEP:  [10 cmH20] 10 cmH20 Plateau Pressure:  [18 cmH20-24 cmH20] 20 cmH20   Intake/Output Summary (Last 24 hours) at 04/29/2020 1336 Last data filed at 04/29/2020 1200 Gross per 24 hour  Intake 1602.72 ml  Output 355 ml  Net 1247.72 ml   Filed Weights   04/28/20 1600 04/28/20 1700 04/28/20 1810  Weight: 113 kg (!) 144 kg (!) 144 kg    Examination: General: obese man intubated.  HENT:ETT secure, SBFT in place  Lungs: chest clear bilaterally.  Tachypneic, Airway pressures adequate but oxygenation is marginal.  Mild air hunger Cardiovascular: irregular rhythm reg rate s1s2, JVP elevated. No murmur Abdomen: Obese soft round non-distended Extremities: BLE 2+ pitting edema. No obvious joint deformity. R fem site without hematoma.  Neuro: minimal response to painful stimuli. PERL.  No longer following commands - Roving eye movements but unable to cross midline to the left.  GU: Foley in place, poor urine output    Patient is +10L since admission.   Resolved Hospital Problem list     Assessment & Plan:   Critically ill due to acute ischemic stroke S/P s/p mechanical thrombectomy-- subsequent reocclusion x 3, stent placement with TICI3, requiring mechanical ventilation for airway protection  Critically ill due to right sided endocarditis due to S.aureus requiring TTM for fever control to prevent secondary neurological injury.  AKI on CKD - now on HD Toxic metabolic encephalopathy.  Uremia also may not be helping mental status.  Anemia of acute illness.  DM2 with hypoglycemia likely due to prolonged effect of sulfonylurea in face of worsening renal function - now resolved.  Leukocytosis Hypertension  Plan:  - ID to see regarding bacteremia/ TV endocarditis.  - For hemodialysis today.  - D10W while NPO to prevent hypoglycemia, resume feeds following TEE - Reduce antihypertensive medications to allow for fluid removal on HD - EEG to rule seizures as contributor to ongoing depressed LOC, if negative, expect that mental status should improve over time with further dialysis and treatment of infection.   Daily Goals Checklist  Pain/Anxiety/Delirium protocol (if indicated): wean precedex to off , keep RASS 0  Neuro vitals: every 4 hours AED's: none VAP protocol (if indicated): bundle in place Respiratory support goals: Continue full ventilator at current levels, start to wean once fluid removed with HD Blood pressure target: 120-160 DVT prophylaxis: SCD's, start [place] heparin Nutrition Status: continue enteral nutrition.  GI prophylaxis: protonix Fluid status goals: Fluid removal with HD Urinary catheter: Assessment of intravascular volume Central lines: PIV only Glucose control: improved on tube feeds.  Mobility/therapy needs: bedrest  Antibiotic de-escalation: none Home medication reconciliation: on hold Daily labs: BMP, CBC Code Status: Full  Family Communication: Spoke to sister  2/23 Disposition: ICU   Goals of Care:  Last date of multidisciplinary goals of care discussion: per primary  Family and staff present:  Summary of discussion:  Follow up goals of care discussion due:-- Code Status: Full   Labs   CBC: Recent Labs  Lab 04/26/2020 1417 04/17/2020 1609 04/24/2020 2010 04/19/2020 2043 04/26/20 0423 04/27/20 0444 04/28/20 0500 04/29/20 0604  WBC 16.8*  --  16.1*  --  12.2* 12.2* 15.2* 16.4*  NEUTROABS 15.3*  --   --   --   --   --   --   --   HGB 8.1*   < > 7.9* 7.5* 8.6* 8.6* 8.6* 8.3*  HCT 25.0*   < > 25.3* 22.0* 27.5* 27.7* 28.0* 27.1*  MCV 88.0  --  90.7  --  89.6 89.6 91.2 89.4  PLT 251  --  226  --  264 217 191 146*   < > = values in this interval not displayed.    Basic Metabolic Panel: Recent Labs  Lab 04/26/20 0423 04/26/20 1735 04/27/20 0444 04/27/20 1632 04/28/20 0500 04/28/20 1108 04/28/20 1521 04/28/20 2100 04/29/20 0604  NA 141 139 140  --  141  --   --   --  144  K 4.9  5.0 5.4*  --  6.1* 6.2* 5.8*  --  5.0  CL 109 108 108  --  109  --   --   --  108  CO2 17* 16* 16*  --  15*  --   --   --  19*  GLUCOSE 42* 126* 126*  --  274*  --   --   --  73  BUN 85* 90* 101*  --  132*  --   --   --  127*  CREATININE 3.35* 3.71* 3.93*  --  4.38*  --   --   --  4.46*  CALCIUM 8.5* 8.1* 8.0*  --  7.8*  --   --   --  7.3*  MG  --  2.1 2.1 2.3 2.3  --   --  2.2  --   PHOS  --  7.3* 8.2* 8.4* 9.3*  --   --   --   --    GFR: Estimated Creatinine Clearance: 20.2 mL/min (A) (by C-G formula based on SCr of 4.46 mg/dL (H)). Recent Labs  Lab 04/10/2020 1411 04/24/2020 1417 04/26/20 0423 04/27/20 0444 04/28/20 0500 04/29/20 0604  PROCALCITON 2.21  --   --   --   --   --   WBC  --    < > 12.2* 12.2* 15.2* 16.4*   < > = values in this interval not displayed.    Liver Function Tests: Recent Labs  Lab 04/15/2020 1417  AST 37  ALT 28  ALKPHOS 66  BILITOT 0.7  PROT 6.1*  ALBUMIN 2.4*   No results for input(s): LIPASE, AMYLASE in the last  168 hours. No results for input(s): AMMONIA in the last 168 hours.  ABG    Component Value Date/Time   PHART 7.291 (L) 04/26/2020 2043   PCO2ART 36.3 04/09/2020 2043   PO2ART 103 04/26/2020 2043   HCO3 17.9 (L) 04/15/2020 2043   TCO2 19 (L) 04/21/2020 2043   ACIDBASEDEF 8.0 (H) 04/26/2020 2043   O2SAT 98.0 04/14/2020 2043     Coagulation Profile: Recent Labs  Lab 04/22/2020 1417 04/26/20 0206  INR 2.2* 2.4*    Cardiac Enzymes: Recent Labs  Lab 04/28/20 1721  CKTOTAL 124    HbA1C: Hemoglobin A1C  Date/Time Value Ref Range Status  01/28/2020 10:58 AM 7.3 (A) 4.0 - 5.6 % Final  08/23/2016 12:00 AM 7.6  Final  04/05/2016 12:00 AM 7.0  Final   Hgb A1c MFr Bld  Date/Time Value Ref Range Status  04/26/2020 04:23 AM 9.8 (H) 4.8 - 5.6 % Final    Comment:    (NOTE) Pre diabetes:          5.7%-6.4%  Diabetes:              >6.4%  Glycemic control for   <7.0% adults with diabetes   07/16/2019 07:51 AM 8.3 (H) <5.7 % of total Hgb Final    Comment:    For someone without known diabetes, a hemoglobin A1c value of 6.5% or greater indicates that they may have  diabetes and this should be confirmed with a follow-up  test. . For someone with known diabetes, a value <7% indicates  that their diabetes is well controlled and a value  greater than or equal to 7% indicates suboptimal  control. A1c targets should be individualized based on  duration of diabetes, age, comorbid conditions, and  other considerations. . Currently, no consensus exists regarding use of hemoglobin A1c for diagnosis  of diabetes for children. .     CBG: Recent Labs  Lab 04/29/20 0323 04/29/20 0745 04/29/20 0807 04/29/20 1116 04/29/20 1120  GLUCAP 127* 45* 108* 23* 30*   BNP    Component Value Date/Time   BNP 1,653.5 (H) 04/23/2020 1417     CRITICAL CARE Performed by: Kipp Brood   Total critical care time: 45 minutes  Critical care time was exclusive of separately billable  procedures and treating other patients.  Critical care was necessary to treat or prevent imminent or life-threatening deterioration.  Critical care was time spent personally by me on the following activities: development of treatment plan with patient and/or surrogate as well as nursing, discussions with consultants, evaluation of patient's response to treatment, examination of patient, obtaining history from patient or surrogate, ordering and performing treatments and interventions, ordering and review of laboratory studies, ordering and review of radiographic studies, pulse oximetry, re-evaluation of patient's condition and participation in multidisciplinary rounds.  Kipp Brood, MD [facility] ICU Physician [place]  Pager: [phone] Mobile: [phone] After hours: [phone].   04/29/2020, 1:36 PM

## 2020-04-29 NOTE — Consult Note (Signed)
[facility]  Total days of antibiotics 2/ceftriaxone               Reason for Consult: staphylococcus aureus bacteremia    Referring Physician: xu  Active Problems:   Stroke (cerebrum) ([place])   AKI (acute kidney injury) ([place])   Encounter for central line placement    HPI: Robert Daniel is a 80 y.o. male with HTN, HLD, T2DM, hx of AF on xarelto who was admiited on for slurred speech and left arm weakness, and left facial drop intermittently on FEb 14th. For unclear reason, EMS was not called at that time. When he had televisit with provider on 2/17, they instructed to call EMS. When EMS evaluated patient at home, he only complained of dizziness and unclear if he had neuro deficits on exam-where they did not take him into the hospital. The patient was admitted on 2/19 for evaluation for possible right MCA stroke.  Labs revealed mild leukocytosis of 12K, but had aki with cr 3 up from BL of 1.3.He was hypothermic in early hospitalization but no blood cx taken on admit. he underwent cerebral angiogram and intracranial stenting and mechanical thrombectomy but reoccluded. Due to hypotension he required neo-synephrine and remained intubated since procedure. Due to having fever of 102F with leukocytosis of 16K, blood cx & urine cx & respiratory cx were drawn MSSA found in blood and urine cultures. Due to non-responsiveness to neuro exam, he had repeat MRI on 2/23 showing progression of size and extent of infarct to right MCA nad PCA with some petechial hemorrhage. As well as small new lacunar infarct to left cerebellar vermis and right midbrain and left MCA territory. He was started on hemodialysis due to hyperkalemia 2/2 aki, through nontunneled catheter. Also has arterial line  Past Medical History:  Diagnosis Date  . Hyperlipidemia 02/16/2017  . Hypertension     Allergies: No Known Allergies  MEDICATIONS: . acetaminophen  650 mg Oral Q4H   Or  . acetaminophen  (TYLENOL) oral liquid 160 mg/5 mL  650 mg Per Tube Q4H   Or  . acetaminophen  650 mg Rectal Q4H  . aspirin  81 mg Per Tube Daily  . atorvastatin  80 mg Per Tube Daily  . busPIRone  30 mg Oral Q8H   Or  . busPIRone  30 mg Per Tube Q8H  . chlorhexidine gluconate (MEDLINE KIT)  15 mL Mouth Rinse BID  . Chlorhexidine Gluconate Cloth  6 each Topical Q0600  . docusate  100 mg Per Tube BID  . feeding supplement (PROSource TF)  45 mL Per Tube BID  . heparin injection (subcutaneous)  5,000 Units Subcutaneous Q8H  . insulin aspart  0-15 Units Subcutaneous Q4H  . labetalol  100 mg Per Tube BID  . mouth rinse  15 mL Mouth Rinse 10 times per day  . pantoprazole sodium  40 mg Per Tube Q2000  . polyethylene glycol  17 g Per Tube Daily  . ticagrelor  90 mg Per Tube BID    Social History   Tobacco Use  . Smoking status: Never Smoker  . Smokeless tobacco: Never Used  Vaping Use  . Vaping Use: Never used  Substance Use Topics  . Alcohol use: No    Comment: wine occasionaly   . Drug use: No    No family history on file.  Review of Systems -  Unable to obtain since intubated/sedated  OBJECTIVE: Temp:  [98.24 F (36.8 C)-102.74 F (39.3  C)] 99.32 F (37.4 C) (02/23 1200) Pulse Rate:  [97-122] 112 (02/23 1200) Resp:  [7-31] 23 (02/23 1200) BP: (86-125)/(40-59) 125/50 (02/23 1141) SpO2:  [93 %-100 %] 98 % (02/23 1200) Arterial Line BP: (92-157)/(43-61) 138/53 (02/23 1200) FiO2 (%):  [40 %] 40 % (02/23 1141) Weight:  [962 kg-144 kg] 144 kg (02/22 1810) Physical Exam  Constitutional: non responsive. . He appears well-developed and well-nourished. No distress.  HENT:  Mouth/Throat: OETT in place. Right IJ Cardiovascular: Normal rate, regular rhythm and normal heart sounds. Exam reveals no gallop and no friction rub.  No murmur heard.  Pulmonary/Chest: Effort normal and breath sounds normal. No respiratory distress. He has no wheezes.  Abdominal: Soft. Bowel sounds are decreasedHe  exhibits no distension. There is no tenderness.  Neurological: does not follow commands, nor open eyes to noxious stimuli Skin: Skin is warm and dry. No rash noted. No erythema.   Lines: right IJ, left Art Line, foley and rectal pouch LABS: Results for orders placed or performed during the hospital encounter of 04/15/2020 (from the past 48 hour(s))  Glucose, capillary     Status: Abnormal   Collection Time: 04/27/20  3:08 PM  Result Value Ref Range   Glucose-Capillary 133 (H) 70 - 99 mg/dL    Comment: Glucose reference range applies only to samples taken after fasting for at least 8 hours.  Magnesium     Status: None   Collection Time: 04/27/20  4:32 PM  Result Value Ref Range   Magnesium 2.3 1.7 - 2.4 mg/dL    Comment: Performed at [facility] [address] [place], [place] [address]  Phosphorus     Status: Abnormal   Collection Time: 04/27/20  4:32 PM  Result Value Ref Range   Phosphorus 8.4 (H) 2.5 - 4.6 mg/dL    Comment: Performed at [facility] [address] [place], [place] [address]  Glucose, capillary     Status: Abnormal   Collection Time: 04/27/20  7:23 PM  Result Value Ref Range   Glucose-Capillary 167 (H) 70 - 99 mg/dL    Comment: Glucose reference range applies only to samples taken after fasting for at least 8 hours.  Glucose, capillary     Status: Abnormal   Collection Time: 04/27/20 11:10 PM  Result Value Ref Range   Glucose-Capillary 197 (H) 70 - 99 mg/dL    Comment: Glucose reference range applies only to samples taken after fasting for at least 8 hours.  Glucose, capillary     Status: Abnormal   Collection Time: 04/28/20  3:15 AM  Result Value Ref Range   Glucose-Capillary 229 (H) 70 - 99 mg/dL    Comment: Glucose reference range applies only to samples taken after fasting for at least 8 hours.  CBC     Status: Abnormal   Collection Time: 04/28/20  5:00 AM  Result Value Ref Range   WBC 15.2 (H) 4.0 - 10.5 K/uL   RBC 3.07 (L) 4.22 - 5.81  MIL/uL   Hemoglobin 8.6 (L) 13.0 - 17.0 g/dL   HCT 28.0 (L) 39.0 - 52.0 %   MCV 91.2 80.0 - 100.0 fL   MCH 28.0 26.0 - 34.0 pg   MCHC 30.7 30.0 - 36.0 g/dL   RDW 15.9 (H) 11.5 - 15.5 %   Platelets 191 150 - 400 K/uL   nRBC 0.3 (H) 0.0 - 0.2 %    Comment: Performed at [facility] [address],  [place], [place] [address]  Basic metabolic panel     Status: Abnormal   Collection Time: 04/28/20  5:00 AM  Result Value Ref Range   Sodium 141 135 - 145 mmol/L   Potassium 6.1 (H) 3.5 - 5.1 mmol/L   Chloride 109 98 - 111 mmol/L   CO2 15 (L) 22 - 32 mmol/L   Glucose, Bld 274 (H) 70 - 99 mg/dL    Comment: Glucose reference range applies only to samples taken after fasting for at least 8 hours.   BUN 132 (H) 8 - 23 mg/dL   Creatinine, Ser 4.38 (H) 0.61 - 1.24 mg/dL   Calcium 7.8 (L) 8.9 - 10.3 mg/dL   GFR, Estimated 13 (L) >60 mL/min    Comment: (NOTE) Calculated using the CKD-EPI Creatinine Equation (2021)    Anion gap 17 (H) 5 - 15    Comment: Performed at [facility] [address], [place], [place] [address]  Magnesium     Status: None   Collection Time: 04/28/20  5:00 AM  Result Value Ref Range   Magnesium 2.3 1.7 - 2.4 mg/dL    Comment: Performed at [facility], [place] [address], [place], [place] [address]  Phosphorus     Status: Abnormal   Collection Time: 04/28/20  5:00 AM  Result Value Ref Range   Phosphorus 9.3 (H) 2.5 - 4.6 mg/dL    Comment: Performed at [facility] [address] [place], [place] [address]  Glucose, capillary     Status: Abnormal   Collection Time: 04/28/20  7:25 AM  Result Value Ref Range   Glucose-Capillary 260 (H) 70 - 99 mg/dL    Comment: Glucose reference range applies only to samples taken after fasting for at least 8 hours.  Urinalysis, Complete w Microscopic Urine, Catheterized     Status: Abnormal   Collection Time: 04/28/20  9:41 AM  Result Value Ref Range   Color, Urine YELLOW YELLOW   APPearance CLOUDY (A)  CLEAR   Specific Gravity, Urine 1.020 1.005 - 1.030   pH 5.0 5.0 - 8.0   Glucose, UA NEGATIVE NEGATIVE mg/dL   Hgb urine dipstick LARGE (A) NEGATIVE   Bilirubin Urine NEGATIVE NEGATIVE   Ketones, ur NEGATIVE NEGATIVE mg/dL   Protein, ur NEGATIVE NEGATIVE mg/dL   Nitrite NEGATIVE NEGATIVE   Leukocytes,Ua LARGE (A) NEGATIVE   Squamous Epithelial / LPF 0-5 0 - 5   WBC, UA >50 0 - 5 WBC/hpf   RBC / HPF >50 0 - 5 RBC/hpf   Bacteria, UA FEW (A) NONE SEEN    Comment: Performed at [facility], [address], [place], [place] [address]  Protein / creatinine ratio, urine     Status: Abnormal   Collection Time: 04/28/20  9:41 AM  Result Value Ref Range   Creatinine, Urine 44.96 mg/dL   Total Protein, Urine 55 mg/dL    Comment: NO NORMAL RANGE ESTABLISHED FOR THIS TEST   Protein Creatinine Ratio 1.22 (H) 0.00 - 0.15 mg/mg[Cre]    Comment: Performed at [facility] [address] [place], [place] [address]  Culture, Respiratory w Gram Stain     Status: None (Preliminary result)   Collection Time: 04/28/20 11:02 AM   Specimen: Tracheal Aspirate; Respiratory  Result Value Ref Range   Specimen Description TRACHEAL ASPIRATE    Special Requests NONE    Gram Stain      ABUNDANT WBC PRESENT,BOTH PMN AND MONONUCLEAR ABUNDANT  GRAM NEGATIVE RODS MODERATE GRAM POSITIVE COCCI RARE YEAST    Culture      CULTURE REINCUBATED FOR BETTER GROWTH Performed at [facility], [place] [address], [place], [place] [address]    Report Status PENDING   Potassium     Status: Abnormal   Collection Time: 04/28/20 11:08 AM  Result Value Ref Range   Potassium 6.2 (H) 3.5 - 5.1 mmol/L    Comment: Performed at [facility] [address], [place], [place] [address]  Glucose, capillary     Status: Abnormal   Collection Time: 04/28/20 11:23 AM  Result Value Ref Range   Glucose-Capillary 242 (H) 70 - 99 mg/dL    Comment: Glucose reference range applies only to samples taken after fasting for  at least 8 hours.  Culture, blood (routine x 2)     Status: Abnormal (Preliminary result)   Collection Time: 04/28/20 11:31 AM   Specimen: BLOOD RIGHT HAND  Result Value Ref Range   Specimen Description BLOOD RIGHT HAND    Special Requests      BOTTLES DRAWN AEROBIC ONLY Blood Culture adequate volume   Culture  Setup Time      AEROBIC BOTTLE ONLY GRAM POSITIVE COCCI CRITICAL RESULT CALLED TO, READ BACK BY AND VERIFIED WITH: V BRYK PHARMD 04/29/20 0049 JDW    Culture (A)     STAPHYLOCOCCUS AUREUS SUSCEPTIBILITIES TO FOLLOW Performed at [facility], [place] [address] [place], [place] [address]    Report Status PENDING   Blood Culture ID Panel (Reflexed)     Status: Abnormal   Collection Time: 04/28/20 11:31 AM  Result Value Ref Range   Enterococcus faecalis NOT DETECTED NOT DETECTED   Enterococcus Faecium NOT DETECTED NOT DETECTED   Listeria monocytogenes NOT DETECTED NOT DETECTED   Staphylococcus species DETECTED (A) NOT DETECTED    Comment: CRITICAL RESULT CALLED TO, READ BACK BY AND VERIFIED WITH: V BRYK PHARMD 04/29/20 0049 JDW    Staphylococcus aureus (BCID) DETECTED (A) NOT DETECTED    Comment: CRITICAL RESULT CALLED TO, READ BACK BY AND VERIFIED WITH: V BRYK PHARMD 04/29/20 0049 JDW    Staphylococcus epidermidis NOT DETECTED NOT DETECTED   Staphylococcus lugdunensis NOT DETECTED NOT DETECTED   Streptococcus species NOT DETECTED NOT DETECTED   Streptococcus agalactiae NOT DETECTED NOT DETECTED   Streptococcus pneumoniae NOT DETECTED NOT DETECTED   Streptococcus pyogenes NOT DETECTED NOT DETECTED   A.calcoaceticus-baumannii NOT DETECTED NOT DETECTED   Bacteroides fragilis NOT DETECTED NOT DETECTED   Enterobacterales NOT DETECTED NOT DETECTED   Enterobacter cloacae complex NOT DETECTED NOT DETECTED   Escherichia coli NOT DETECTED NOT DETECTED   Klebsiella aerogenes NOT DETECTED NOT DETECTED   Klebsiella oxytoca NOT DETECTED NOT DETECTED   Klebsiella pneumoniae NOT  DETECTED NOT DETECTED   Proteus species NOT DETECTED NOT DETECTED   Salmonella species NOT DETECTED NOT DETECTED   Serratia marcescens NOT DETECTED NOT DETECTED   Haemophilus influenzae NOT DETECTED NOT DETECTED   Neisseria meningitidis NOT DETECTED NOT DETECTED   Pseudomonas aeruginosa NOT DETECTED NOT DETECTED   Stenotrophomonas maltophilia NOT DETECTED NOT DETECTED   Candida albicans NOT DETECTED NOT DETECTED   Candida auris NOT DETECTED NOT DETECTED   Candida glabrata NOT DETECTED NOT DETECTED   Candida krusei NOT DETECTED NOT DETECTED   Candida parapsilosis NOT DETECTED NOT DETECTED   Candida tropicalis NOT DETECTED NOT DETECTED   Cryptococcus neoformans/gattii NOT DETECTED NOT DETECTED   Meth resistant mecA/C and  MREJ NOT DETECTED NOT DETECTED    Comment: Performed at [facility], [place] [address] [place], [place] [address]  Culture, blood (routine x 2)     Status: Abnormal (Preliminary result)   Collection Time: 04/28/20 11:33 AM   Specimen: BLOOD RIGHT HAND  Result Value Ref Range   Specimen Description BLOOD RIGHT HAND    Special Requests      BOTTLES DRAWN AEROBIC ONLY Blood Culture adequate volume   Culture  Setup Time      AEROBIC BOTTLE ONLY GRAM POSITIVE COCCI CRITICAL VALUE NOTED.  VALUE IS CONSISTENT WITH PREVIOUSLY REPORTED AND CALLED VALUE. Performed at [facility], [place] [address], [place], [place] [address]    Culture STAPHYLOCOCCUS AUREUS (A)    Report Status PENDING   Urine Culture     Status: Abnormal (Preliminary result)   Collection Time: 04/28/20 12:23 PM   Specimen: Urine, Random  Result Value Ref Range   Specimen Description URINE, RANDOM    Special Requests NONE    Culture (A)     >=100,000 COLONIES/mL STAPHYLOCOCCUS AUREUS SUSCEPTIBILITIES TO FOLLOW Performed at [facility], [place] [address] [place], [place] [address]    Report Status PENDING   Glucose, capillary     Status: Abnormal   Collection Time: 04/28/20  3:08 PM   Result Value Ref Range   Glucose-Capillary 198 (H) 70 - 99 mg/dL    Comment: Glucose reference range applies only to samples taken after fasting for at least 8 hours.  Potassium     Status: Abnormal   Collection Time: 04/28/20  3:21 PM  Result Value Ref Range   Potassium 5.8 (H) 3.5 - 5.1 mmol/L    Comment: SLIGHT HEMOLYSIS Performed at [facility] [address], [place], [place] [address]   Hepatitis B surface antigen     Status: None   Collection Time: 04/28/20  3:34 PM  Result Value Ref Range   Hepatitis B Surface Ag NON REACTIVE NON REACTIVE    Comment: Performed at [facility] [address], [place], [place] [address]  Hepatitis B core antibody, total     Status: None   Collection Time: 04/28/20  3:34 PM  Result Value Ref Range   Hep B Core Total Ab NON REACTIVE NON REACTIVE    Comment: Performed at [facility] [address] [place],  [address]  Hepatitis B e antibody     Status: None   Collection Time: 04/28/20  3:34 PM  Result Value Ref Range   Hep B E Ab Negative Negative    Comment: (NOTE) Performed At: [facility] [place], [place] [id number] Rush Farmer MD [id number]   CK     Status: None   Collection Time: 04/28/20  5:21 PM  Result Value Ref Range   Total CK 124 49 - 397 U/L    Comment: Performed at [facility], [place] [address], [place], [place] [address]  Glucose, capillary     Status: Abnormal   Collection Time: 04/28/20  7:26 PM  Result Value Ref Range   Glucose-Capillary 169 (H) 70 - 99 mg/dL    Comment: Glucose reference range applies only to samples taken after fasting for at least 8 hours.  Magnesium     Status: None   Collection Time: 04/28/20  9:00 PM  Result Value Ref Range   Magnesium 2.2 1.7 - 2.4 mg/dL    Comment:  Performed at [facility], [place] [address], [place], [place] [address]  Glucose, capillary     Status: Abnormal   Collection Time: 04/28/20 11:09 PM  Result  Value Ref Range   Glucose-Capillary 154 (H) 70 - 99 mg/dL    Comment: Glucose reference range applies only to samples taken after fasting for at least 8 hours.  Glucose, capillary     Status: Abnormal   Collection Time: 04/29/20  3:23 AM  Result Value Ref Range   Glucose-Capillary 127 (H) 70 - 99 mg/dL    Comment: Glucose reference range applies only to samples taken after fasting for at least 8 hours.  CBC     Status: Abnormal   Collection Time: 04/29/20  6:04 AM  Result Value Ref Range   WBC 16.4 (H) 4.0 - 10.5 K/uL   RBC 3.03 (L) 4.22 - 5.81 MIL/uL   Hemoglobin 8.3 (L) 13.0 - 17.0 g/dL   HCT 27.1 (L) 39.0 - 52.0 %   MCV 89.4 80.0 - 100.0 fL   MCH 27.4 26.0 - 34.0 pg   MCHC 30.6 30.0 - 36.0 g/dL   RDW 15.9 (H) 11.5 - 15.5 %   Platelets 146 (L) 150 - 400 K/uL   nRBC 0.3 (H) 0.0 - 0.2 %    Comment: Performed at [facility] [address] [place], [place] [address]  Basic metabolic panel     Status: Abnormal   Collection Time: 04/29/20  6:04 AM  Result Value Ref Range   Sodium 144 135 - 145 mmol/L   Potassium 5.0 3.5 - 5.1 mmol/L   Chloride 108 98 - 111 mmol/L   CO2 19 (L) 22 - 32 mmol/L   Glucose, Bld 73 70 - 99 mg/dL    Comment: Glucose reference range applies only to samples taken after fasting for at least 8 hours.   BUN 127 (H) 8 - 23 mg/dL   Creatinine, Ser 4.46 (H) 0.61 - 1.24 mg/dL   Calcium 7.3 (L) 8.9 - 10.3 mg/dL   GFR, Estimated 13 (L) >60 mL/min    Comment: (NOTE) Calculated using the CKD-EPI Creatinine Equation (2021)    Anion gap 17 (H) 5 - 15    Comment: Performed at [facility] [address] [place], [place] [address]  Triglycerides     Status: None   Collection Time: 04/29/20  6:04 AM  Result Value Ref Range   Triglycerides 96 <150 mg/dL    Comment: Performed at [facility] [address] [place], [place] [address]  Glucose, capillary     Status: Abnormal   Collection Time: 04/29/20  7:45 AM  Result Value Ref Range    Glucose-Capillary 45 (L) 70 - 99 mg/dL    Comment: Glucose reference range applies only to samples taken after fasting for at least 8 hours.  Glucose, capillary     Status: Abnormal   Collection Time: 04/29/20  8:07 AM  Result Value Ref Range   Glucose-Capillary 108 (H) 70 - 99 mg/dL    Comment: Glucose reference range applies only to samples taken after fasting for at least 8 hours.  Glucose, capillary     Status: Abnormal   Collection Time: 04/29/20 11:16 AM  Result Value Ref Range   Glucose-Capillary 23 (LL) 70 - 99 mg/dL    Comment: Glucose reference range applies only to samples taken after fasting for at least 8 hours.   Comment 1 Notify RN  Comment 2 Repeat Test   Glucose, capillary     Status: Abnormal   Collection Time: 04/29/20 11:20 AM  Result Value Ref Range   Glucose-Capillary 30 (LL) 70 - 99 mg/dL    Comment: Glucose reference range applies only to samples taken after fasting for at least 8 hours.   Comment 1 Notify RN    Comment 2 Document in Chart     MICRO:  IMAGING: CT HEAD WO CONTRAST  Result Date: 04/28/2020 CLINICAL DATA:  Altered mental status. EXAM: CT HEAD WITHOUT CONTRAST TECHNIQUE: Contiguous axial images were obtained from the base of the skull through the vertex without intravenous contrast. COMPARISON:  MRI April 26, 2020.  CT exams April 25, 2020. FINDINGS: Brain: Scattered cortical and subcortical areas of hypoattenuation within the right MCA distribution, compatible with evolving infarcts. No progressive mass effect or midline shift. Subtle cortical versus sulcal hyperdensity along the high right posterior frontal convexity may represent petechial hemorrhage versus trace subarachnoid hemorrhage. No mass occupying hemorrhagic transformation. No mass lesion. Additional mild patchy white matter hypoattenuation, likely related to chronic microvascular ischemic disease. Vascular: Right M2 MCA stent.  Calcific atherosclerosis. Skull: No acute fracture.  Sinuses/Orbits: Scattered paranasal sinus mucosal thickening. Other: No mastoid effusions. IMPRESSION: 1. Scattered cortical and subcortical areas of hypoattenuation within the right MCA distribution, compatible with evolving infarcts. No progressive mass effect. If there is concern for expansion of known infarcts, an MRI could better evaluate. 2. Subtle cortical versus sulcal hyperdensity along the high right posterior frontal convexity may represent petechial hemorrhage versus trace subarachnoid hemorrhage. No mass occupying hemorrhagic transformation. 3. Paranasal sinus mucosal thickening. These results will be called to the ordering clinician or representative by the Radiologist Assistant, and communication documented in the PACS or [employer]. Electronically Signed   By: Margaretha Sheffield MD   On: 04/28/2020 21:24   MR ANGIO HEAD WO CONTRAST  Result Date: 04/29/2020 CLINICAL DATA:  79 year old male with altered mental status. Scattered infarcts in the right hemisphere, occasionally in the left hemisphere on 04/26/2020. History of right MCA stent. Questionable petechial or subarachnoid hemorrhage along the right superior convexity on head CT yesterday. EXAM: MRI HEAD WITHOUT CONTRAST MRA HEAD WITHOUT CONTRAST TECHNIQUE: Multiplanar, multiecho pulse sequences of the brain and surrounding structures were obtained without intravenous contrast. Angiographic images of the head were obtained using MRA technique without contrast. COMPARISON:  Brain MRI and MRA 04/26/2020.  Head CT yesterday. FINDINGS: MRI HEAD FINDINGS Brain: Increased size and confluence of multifocal restricted diffusion, now moderately affecting the right hemisphere in both right MCA and right PCA vascular territories. Confluent right corona radiata and frontal operculum progression (series 2, image 34). Similar increased conspicuity of small foci along the right internal capsule. More extensive cortical involvement elsewhere including at  the left occipital pole, junction of the right temporal and occipital lobes. A few small scattered areas of cortical restricted diffusion in the left superior and posterior frontal lobe are also mildly increased (e.g. Series 2, image 40). There is also a small round new focus of restricted diffusion in the central midbrain to the right (series 2, image 21). And furthermore increased conspicuity of a similar small focus of restriction in the left cerebellar vermis (image 12). No other new areas of involvement identified. Progressed cytotoxic edema associated with the above. Mild new mass effect on the right lateral ventricle. No midline shift. Basilar cisterns remain patent. There is punctate but increased petechial or subarachnoid hemorrhage along the right central sulcus as  suspected by CT (series 8, image 70 on SWI). Stable several chronic microhemorrhages in the opposite left frontal lobe. Susceptibility artifact related to posterior right MCA branch stent. No intraventricular hemorrhage or ventriculomegaly. Cervicomedullary junction and pituitary are within normal limits. Vascular: Major intracranial vascular flow voids remain stable. Skull and upper cervical spine: Stable, negative for age. Sinuses/Orbits: Stable and negative orbits. Fluid in the nasopharynx with right nasoenteric tube in place now. Stable fluid layering in the sphenoid sinuses. Mild paranasal sinus mucosal thickening elsewhere. New left greater than right mastoid fluid. Other: None. MRA HEAD FINDINGS Source images demonstrate stable antegrade flow signal compared to 04/26/20 throughout the anterior and posterior circulation. Diminutive vertebrobasilar system, somewhat dominant left vertebral artery. Less of the V4 segment is visible today. Distal vertebral and basilar artery irregularity but only mild associated stenosis. Basilar functionally terminates in the SCA is. Fetal type bilateral PCA origins and bilateral PCAs appears stable and within  normal limits. Stable ICA siphons, mild tortuosity and irregularity without ICA stenosis. Ophthalmic and posterior communicating artery origins remain patent. Patent carotid termini. ACAs are stable and within normal limits, left A1 mildly dominant. Normal anterior communicating artery. Left MCA M1 segment, left MCA bifurcation and visible left MCA branches are stable and within normal limits. Right MCA M1 remains patent. Patent right MCA bifurcation and proximal M2 branches. Right MCA M2 vascular stent susceptibility and visible right MCA branches appear stable. IMPRESSION: 1. Progressed size and extent of infarcts widely scattered in the right MCA and PCA territories since 04/26/2020. Associated petechial hemorrhage as suspected by CT. No malignant hemorrhagic transformation. Mild mass effect with no midline shift at this time. 2. Small new lacunar type infarcts in the left cerebellar vermis and right midbrain. Several small left MCA territory cortical infarcts again noted. 3. Stable intracranial MRA from 2 days ago. No large vessel occlusion. Stable MRA appearance of right MCA M2 stent. Electronically Signed   By: Genevie Ann M.D.   On: 04/29/2020 09:07   MR BRAIN WO CONTRAST  Result Date: 04/29/2020 CLINICAL DATA:  80 year old male with altered mental status. Scattered infarcts in the right hemisphere, occasionally in the left hemisphere on 04/26/2020. History of right MCA stent. Questionable petechial or subarachnoid hemorrhage along the right superior convexity on head CT yesterday. EXAM: MRI HEAD WITHOUT CONTRAST MRA HEAD WITHOUT CONTRAST TECHNIQUE: Multiplanar, multiecho pulse sequences of the brain and surrounding structures were obtained without intravenous contrast. Angiographic images of the head were obtained using MRA technique without contrast. COMPARISON:  Brain MRI and MRA 04/26/2020.  Head CT yesterday. FINDINGS: MRI HEAD FINDINGS Brain: Increased size and confluence of multifocal restricted  diffusion, now moderately affecting the right hemisphere in both right MCA and right PCA vascular territories. Confluent right corona radiata and frontal operculum progression (series 2, image 34). Similar increased conspicuity of small foci along the right internal capsule. More extensive cortical involvement elsewhere including at the left occipital pole, junction of the right temporal and occipital lobes. A few small scattered areas of cortical restricted diffusion in the left superior and posterior frontal lobe are also mildly increased (e.g. Series 2, image 40). There is also a small round new focus of restricted diffusion in the central midbrain to the right (series 2, image 21). And furthermore increased conspicuity of a similar small focus of restriction in the left cerebellar vermis (image 12). No other new areas of involvement identified. Progressed cytotoxic edema associated with the above. Mild new mass effect on the right lateral ventricle.  No midline shift. Basilar cisterns remain patent. There is punctate but increased petechial or subarachnoid hemorrhage along the right central sulcus as suspected by CT (series 8, image 70 on [facility]). Stable several chronic microhemorrhages in the opposite left frontal lobe. Susceptibility artifact related to posterior right MCA branch stent. No intraventricular hemorrhage or ventriculomegaly. Cervicomedullary junction and pituitary are within normal limits. Vascular: Major intracranial vascular flow voids remain stable. Skull and upper cervical spine: Stable, negative for age. Sinuses/Orbits: Stable and negative orbits. Fluid in the nasopharynx with right nasoenteric tube in place now. Stable fluid layering in the sphenoid sinuses. Mild paranasal sinus mucosal thickening elsewhere. New left greater than right mastoid fluid. Other: None. MRA HEAD FINDINGS Source images demonstrate stable antegrade flow signal compared to 04/26/20 throughout the anterior and posterior  circulation. Diminutive vertebrobasilar system, somewhat dominant left vertebral artery. Less of the V4 segment is visible today. Distal vertebral and basilar artery irregularity but only mild associated stenosis. Basilar functionally terminates in the SCA is. Fetal type bilateral PCA origins and bilateral PCAs appears stable and within normal limits. Stable ICA siphons, mild tortuosity and irregularity without ICA stenosis. Ophthalmic and posterior communicating artery origins remain patent. Patent carotid termini. ACAs are stable and within normal limits, left A1 mildly dominant. Normal anterior communicating artery. Left MCA M1 segment, left MCA bifurcation and visible left MCA branches are stable and within normal limits. Right MCA M1 remains patent. Patent right MCA bifurcation and proximal M2 branches. Right MCA M2 vascular stent susceptibility and visible right MCA branches appear stable. IMPRESSION: 1. Progressed size and extent of infarcts widely scattered in the right MCA and PCA territories since 04/26/2020. Associated petechial hemorrhage as suspected by CT. No malignant hemorrhagic transformation. Mild mass effect with no midline shift at this time. 2. Small new lacunar type infarcts in the left cerebellar vermis and right midbrain. Several small left MCA territory cortical infarcts again noted. 3. Stable intracranial MRA from 2 days ago. No large vessel occlusion. Stable MRA appearance of right MCA M2 stent. Electronically Signed   By: Genevie Ann M.D.   On: 04/29/2020 09:07   US RENAL  Result Date: 04/29/2020 CLINICAL DATA:  Acute kidney injury EXAM: RENAL / URINARY TRACT ULTRASOUND COMPLETE COMPARISON:  None. FINDINGS: Right Kidney: Renal measurements: 14.4 x 6 x 5.9 cm = volume: 265 mL. Cortical echogenicity normal. No hydronephrosis. Possible exophytic cyst off the midpole but poor visibility. Left Kidney: Renal measurements: 13.6 x 7.3 x 6.4 cm = volume: 332.5 mL. Echogenicity within normal  limits. No hydronephrosis. Cyst at the lower pole measuring 6.3 cm. Bladder: Not well seen and presumably empty Other: None. IMPRESSION: 1. Negative for hydronephrosis. 2. Simple appearing left renal cyst Electronically Signed   By: Donavan Foil M.D.   On: 04/29/2020 00:11   DG CHEST PORT 1 VIEW  Result Date: 04/28/2020 CLINICAL DATA:  Assess positioning of hemodialysis catheter. EXAM: PORTABLE CHEST 1 VIEW COMPARISON:  Same day chest radiograph at 0900 hours. FINDINGS: Right IJ central venous catheter with tip overlying the superior vena cava. Endotracheal tube with tip overlying the distal thoracic trachea. Enteric tube coursing below the diaphragm with tip obscured by collimation. The heart size and mediastinal contours are unchanged. Aortic atherosclerosis. Similar bilateral hilar airspace opacities. No significant pleural effusion or visible pneumothorax. The visualized skeletal structures are unchanged. IMPRESSION: 1. Right IJ since venous catheter with tip overlying the superior vena cava. No visible pneumothorax. Electronically Signed   By: Dahlia Bailiff MD  On: 04/28/2020 12:27   DG CHEST PORT 1 VIEW  Result Date: 04/28/2020 CLINICAL DATA:  Hypoxia EXAM: PORTABLE CHEST 1 VIEW COMPARISON:  April 26, 2020 FINDINGS: Endotracheal tube tip is 2.2 cm above the carina. Enteric tube tip is below the diaphragm. No pneumothorax. There is airspace opacity overlying the right hilum, a change from 2 days prior. There is bibasilar atelectasis. Heart size and pulmonary vascularity are normal. No adenopathy. No bone lesions. There is aortic atherosclerosis. IMPRESSION: Tube positions as described without pneumothorax. Suspect pneumonia right perihilar region given new opacity in this area compared to 2 days prior. Bibasilar atelectasis. Stable cardiac silhouette. Aortic Atherosclerosis (ICD10-I70.0). Electronically Signed   By: Lowella Grip III M.D.   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 CNS process continues to worsen with new areas of involvement on recent brain MRI. Stroke throught to be due to intracranial stenosis. He had fever and leukocytosis onHD#4 and found to have staph aureus bacteremia and UTI. The question is whether he was bacteremia caused endocarditis which led to his stroke. Difficult to tell since he did not have blood cx on admit. Awaiting TEE results  - will place him on nafcillin to have CNS penetration.  - treat for minimum of 4 wks for complicated bacteremia - will dose adjust nafcillin due to being on HD   - overall condition is guarded given extensive stroke

## 2020-04-29 NOTE — Progress Notes (Signed)
Pt. Transported to MRI and back without any complications. RT will continue to monitor

## 2020-04-29 NOTE — Progress Notes (Signed)
Indication: stroke and septic emboli  After careful review of history and examination, the risks and benefits of transesophageal echocardiogram have been explained including risks of esophageal damage, perforation (1:10,000 risk), bleeding, pharyngeal hematoma as well as other potential complications associated with conscious sedation including aspiration, arrhythmia, respiratory failure and death. Alternatives to treatment were discussed, questions were answered. Patient's family is willing to proceed.

## 2020-04-29 NOTE — Procedures (Signed)
Patient Name: Robert Daniel  MRN: [id number]  Epilepsy Attending: Lora Havens  Referring Physician/Provider: Dr Lesleigh Noe Date: 04/29/2020 Duration: 32.42 mins  Patient history: 80 year old male with worsening mental status.  EEG to evaluate for seizures.  Level of alertness:  comatose  AEDs during EEG study: None  Technical aspects: This EEG study was done with scalp electrodes positioned according to the 10-20 International system of electrode placement. Electrical activity was acquired at a sampling rate of '500Hz'$  and reviewed with a high frequency filter of '70Hz'$  and a low frequency filter of '1Hz'$ . EEG data were recorded continuously and digitally stored.   Description: EEG showed continuous 5 to 6 Hz theta slowing in left hemisphere as well as 2-'3Hz'$  low amplitude delta slowing in right hemisphere.  Hyperventilation and photic stimulation were not performed.     ABNORMALITY -Continuous slow, generalized and lateralized right hemisphere   IMPRESSION: This study is suggestive of cortical dysfunction in right hemisphere due to underlying structural abnormality as well as moderate to severe diffuse encephalopathy, nonspecific etiology. No seizures or epileptiform discharges were seen throughout the recording.  [name] [name]

## 2020-04-29 NOTE — Progress Notes (Signed)
  Echocardiogram Echocardiogram Transesophageal has been performed.  Darlina Sicilian M 04/29/2020, 2:24 PM

## 2020-04-29 NOTE — Progress Notes (Addendum)
EEG complete - results pending 

## 2020-04-29 NOTE — Progress Notes (Signed)
PHARMACY - PHYSICIAN COMMUNICATION CRITICAL VALUE ALERT - BLOOD CULTURE IDENTIFICATION (BCID)  Robert Daniel is an 80 y.o. male who presented to [facility] on 04/19/2020 with a chief complaint of slurred speech and left-sided weakness >> admitted for stroke.  Assessment:  In ICU for neuro care s/p thrombectomy, had leukocytosis and fever with abnormal UA on 2/22 >> started on Rocephin for presumed UTI, now w/ blood cx growing MSSA in 2 of 2 bottles.  Name of physician (or Provider) Contacted: SBhagat MD  Current antibiotics: Rocephin 2g Q24H  Changes to prescribed antibiotics recommended:  Could consider changing to Ancef but will continue Rocephin for now since it covers MSSA while awaiting urine cx results; neuro team to consider possibility of bacterial endocarditis.  Results for orders placed or performed during the hospital encounter of 04/19/2020  Blood Culture ID Panel (Reflexed) (Collected: 04/28/2020 11:31 AM)  Result Value Ref Range   Enterococcus faecalis NOT DETECTED NOT DETECTED   Enterococcus Faecium NOT DETECTED NOT DETECTED   Listeria monocytogenes NOT DETECTED NOT DETECTED   Staphylococcus species DETECTED (A) NOT DETECTED   Staphylococcus aureus (BCID) DETECTED (A) NOT DETECTED   Staphylococcus epidermidis NOT DETECTED NOT DETECTED   Staphylococcus lugdunensis NOT DETECTED NOT DETECTED   Streptococcus species NOT DETECTED NOT DETECTED   Streptococcus agalactiae NOT DETECTED NOT DETECTED   Streptococcus pneumoniae NOT DETECTED NOT DETECTED   Streptococcus pyogenes NOT DETECTED NOT DETECTED   A.calcoaceticus-baumannii NOT DETECTED NOT DETECTED   Bacteroides fragilis NOT DETECTED NOT DETECTED   Enterobacterales NOT DETECTED NOT DETECTED   Enterobacter cloacae complex NOT DETECTED NOT DETECTED   Escherichia coli NOT DETECTED NOT DETECTED   Klebsiella aerogenes NOT DETECTED NOT DETECTED   Klebsiella oxytoca NOT DETECTED NOT DETECTED   Klebsiella pneumoniae NOT DETECTED  NOT DETECTED   Proteus species NOT DETECTED NOT DETECTED   Salmonella species NOT DETECTED NOT DETECTED   Serratia marcescens NOT DETECTED NOT DETECTED   Haemophilus influenzae NOT DETECTED NOT DETECTED   Neisseria meningitidis NOT DETECTED NOT DETECTED   Pseudomonas aeruginosa NOT DETECTED NOT DETECTED   Stenotrophomonas maltophilia NOT DETECTED NOT DETECTED   Candida albicans NOT DETECTED NOT DETECTED   Candida auris NOT DETECTED NOT DETECTED   Candida glabrata NOT DETECTED NOT DETECTED   Candida krusei NOT DETECTED NOT DETECTED   Candida parapsilosis NOT DETECTED NOT DETECTED   Candida tropicalis NOT DETECTED NOT DETECTED   Cryptococcus neoformans/gattii NOT DETECTED NOT DETECTED   Meth resistant mecA/C and MREJ NOT DETECTED NOT DETECTED    Wynona Neat, PharmD, BCPS  04/29/2020  1:29 AM

## 2020-04-29 NOTE — Progress Notes (Addendum)
STROKE TEAM PROGRESS NOTE   SUBJECTIVE (INTERVAL HISTORY) Pt is evaluated at the bedside. RN present in the room. Pt is critically ill. On Neuro Exam, Pt is still intubated, doesn't follows commands. Doesn't open eyes to noxious stimuli.  Can only see mild flicker in rt arm with noxious stimuli. Not able to lift extremities against gravity. Pt has fluid overload. BP stable. Pt was febrile at temp 100.04 at 2300 yesterday. Now Temp 99.14. PR - 97-122/min. Pt had HD yesterday and Nephro planning to do another HD today. Na 144, K 5, creatinine elevated at 4.46, WBC's increased to 16.4. Pt is on ASA 81 mg and brilinta 90 mg due to stenting. Blood culture positive for Staph aureus. Pt was started on Nafcilllin today. CCM following. Ordered TEE. Will need EEG to rule our Seizures. Per CCM Pt may need LP.   OBJECTIVE  Temp:  [98.24 F (36.8 C)-102.74 F (39.3 C)] 99.14 F (37.3 C) (02/23 1000) Pulse Rate:  [97-122] 108 (02/23 1000) Cardiac Rhythm: Sinus tachycardia (02/23 0800) Resp:  [7-31] 24 (02/23 1000) BP: (86-125)/(40-59) 125/53 (02/23 0732) SpO2:  [93 %-100 %] 98 % (02/23 1000) Arterial Line BP: (92-157)/(43-61) 140/54 (02/23 1000) FiO2 (%):  [40 %] 40 % (02/23 0732) Weight:  [962 kg-144 kg] 144 kg (02/22 1810)  Recent Labs  Lab 04/28/20 1926 04/28/20 2309 04/29/20 0323 04/29/20 0745 04/29/20 0807  GLUCAP 169* 154* 127* 45* 108*   Recent Labs  Lab 04/26/20 0423 04/26/20 1735 04/27/20 0444 04/27/20 1632 04/28/20 0500 04/28/20 1108 04/28/20 1521 04/28/20 2100 04/29/20 0604  NA 141 139 140  --  141  --   --   --  144  K 4.9 5.0 5.4*  --  6.1* 6.2* 5.8*  --  5.0  CL 109 108 108  --  109  --   --   --  108  CO2 17* 16* 16*  --  15*  --   --   --  19*  GLUCOSE 42* 126* 126*  --  274*  --   --   --  73  BUN 85* 90* 101*  --  132*  --   --   --  127*  CREATININE 3.35* 3.71* 3.93*  --  4.38*  --   --   --  4.46*  CALCIUM 8.5* 8.1* 8.0*  --  7.8*  --   --   --  7.3*  MG  --  2.1  2.1 2.3 2.3  --   --  2.2  --   PHOS  --  7.3* 8.2* 8.4* 9.3*  --   --   --   --    Recent Labs  Lab 04/30/2020 1417  AST 37  ALT 28  ALKPHOS 66  BILITOT 0.7  PROT 6.1*  ALBUMIN 2.4*   Recent Labs  Lab 04/16/2020 1417 05/03/2020 1609 04/30/2020 2010 04/15/2020 2043 04/26/20 0423 04/27/20 0444 04/28/20 0500 04/29/20 0604  WBC 16.8*  --  16.1*  --  12.2* 12.2* 15.2* 16.4*  NEUTROABS 15.3*  --   --   --   --   --   --   --   HGB 8.1*   < > 7.9* 7.5* 8.6* 8.6* 8.6* 8.3*  HCT 25.0*   < > 25.3* 22.0* 27.5* 27.7* 28.0* 27.1*  MCV 88.0  --  90.7  --  89.6 89.6 91.2 89.4  PLT 251  --  226  --  264 217 191 146*   < > =  values in this interval not displayed.   Recent Labs  Lab 04/28/20 1721  CKTOTAL 124   No results for input(s): LABPROT, INR in the last 72 hours. Recent Labs    04/28/20 0941  COLORURINE YELLOW  LABSPEC 1.020  PHURINE 5.0  GLUCOSEU NEGATIVE  HGBUR LARGE*  BILIRUBINUR NEGATIVE  KETONESUR NEGATIVE  PROTEINUR NEGATIVE  NITRITE NEGATIVE  LEUKOCYTESUR LARGE*       Component Value Date/Time   CHOL 57 04/26/2020 0423   TRIG 96 04/29/2020 0604   HDL <10 (L) 04/26/2020 0423   CHOLHDL NOT CALCULATED 04/26/2020 0423   VLDL 36 04/26/2020 0423   LDLCALC NOT CALCULATED 04/26/2020 0423   LDLCALC 121 (H) 07/16/2019 0751   Lab Results  Component Value Date   HGBA1C 9.8 (H) 04/26/2020   No results found for: LABOPIA, [place], [place], [place], [facility], [place]  Recent Labs  Lab 04/22/2020 [place] <10    I have personally reviewed the radiological images below and agree with the radiology interpretations.  DG Chest 1 View  Result Date: 04/19/2020 CLINICAL DATA:  Acute shortness of breath.  CVA. EXAM: CHEST  1 VIEW COMPARISON:  07/08/2019 chest radiograph FINDINGS: This is a low volume study. The cardiomediastinal silhouette is unchanged. Mild pulmonary vascular congestion noted. There is no evidence of focal airspace disease, pulmonary edema, suspicious  pulmonary nodule/mass, pleural effusion, or pneumothorax. No acute bony abnormalities are identified. IMPRESSION: Mild pulmonary vascular congestion. Electronically Signed   By: Margarette Canada M.D.   On: 05/01/2020 15:02   CT HEAD WO CONTRAST  Result Date: 04/28/2020 CLINICAL DATA:  Altered mental status. EXAM: CT HEAD WITHOUT CONTRAST TECHNIQUE: Contiguous axial images were obtained from the base of the skull through the vertex without intravenous contrast. COMPARISON:  MRI April 26, 2020.  CT exams April 25, 2020. FINDINGS: Brain: Scattered cortical and subcortical areas of hypoattenuation within the right MCA distribution, compatible with evolving infarcts. No progressive mass effect or midline shift. Subtle cortical versus sulcal hyperdensity along the high right posterior frontal convexity may represent petechial hemorrhage versus trace subarachnoid hemorrhage. No mass occupying hemorrhagic transformation. No mass lesion. Additional mild patchy white matter hypoattenuation, likely related to chronic microvascular ischemic disease. Vascular: Right M2 MCA stent.  Calcific atherosclerosis. Skull: No acute fracture. Sinuses/Orbits: Scattered paranasal sinus mucosal thickening. Other: No mastoid effusions. IMPRESSION: 1. Scattered cortical and subcortical areas of hypoattenuation within the right MCA distribution, compatible with evolving infarcts. No progressive mass effect. If there is concern for expansion of known infarcts, an MRI could better evaluate. 2. Subtle cortical versus sulcal hyperdensity along the high right posterior frontal convexity may represent petechial hemorrhage versus trace subarachnoid hemorrhage. No mass occupying hemorrhagic transformation. 3. Paranasal sinus mucosal thickening. These results will be called to the ordering clinician or representative by the Radiologist Assistant, and communication documented in the PACS or [employer]. Electronically Signed   By: Margaretha Sheffield MD   On: 04/28/2020 21:24   MR ANGIO HEAD WO CONTRAST  Result Date: 04/29/2020 CLINICAL DATA:  80 year old male with altered mental status. Scattered infarcts in the right hemisphere, occasionally in the left hemisphere on 04/26/2020. History of right MCA stent. Questionable petechial or subarachnoid hemorrhage along the right superior convexity on head CT yesterday. EXAM: MRI HEAD WITHOUT CONTRAST MRA HEAD WITHOUT CONTRAST TECHNIQUE: Multiplanar, multiecho pulse sequences of the brain and surrounding structures were obtained without intravenous contrast. Angiographic images of the head were obtained using MRA technique without contrast. COMPARISON:  Brain  MRI and MRA 04/26/2020.  Head CT yesterday. FINDINGS: MRI HEAD FINDINGS Brain: Increased size and confluence of multifocal restricted diffusion, now moderately affecting the right hemisphere in both right MCA and right PCA vascular territories. Confluent right corona radiata and frontal operculum progression (series 2, image 34). Similar increased conspicuity of small foci along the right internal capsule. More extensive cortical involvement elsewhere including at the left occipital pole, junction of the right temporal and occipital lobes. A few small scattered areas of cortical restricted diffusion in the left superior and posterior frontal lobe are also mildly increased (e.g. Series 2, image 40). There is also a small round new focus of restricted diffusion in the central midbrain to the right (series 2, image 21). And furthermore increased conspicuity of a similar small focus of restriction in the left cerebellar vermis (image 12). No other new areas of involvement identified. Progressed cytotoxic edema associated with the above. Mild new mass effect on the right lateral ventricle. No midline shift. Basilar cisterns remain patent. There is punctate but increased petechial or subarachnoid hemorrhage along the right central sulcus as suspected by CT  (series 8, image 70 on [facility]). Stable several chronic microhemorrhages in the opposite left frontal lobe. Susceptibility artifact related to posterior right MCA branch stent. No intraventricular hemorrhage or ventriculomegaly. Cervicomedullary junction and pituitary are within normal limits. Vascular: Major intracranial vascular flow voids remain stable. Skull and upper cervical spine: Stable, negative for age. Sinuses/Orbits: Stable and negative orbits. Fluid in the nasopharynx with right nasoenteric tube in place now. Stable fluid layering in the sphenoid sinuses. Mild paranasal sinus mucosal thickening elsewhere. New left greater than right mastoid fluid. Other: None. MRA HEAD FINDINGS Source images demonstrate stable antegrade flow signal compared to 04/26/20 throughout the anterior and posterior circulation. Diminutive vertebrobasilar system, somewhat dominant left vertebral artery. Less of the V4 segment is visible today. Distal vertebral and basilar artery irregularity but only mild associated stenosis. Basilar functionally terminates in the SCA is. Fetal type bilateral PCA origins and bilateral PCAs appears stable and within normal limits. Stable ICA siphons, mild tortuosity and irregularity without ICA stenosis. Ophthalmic and posterior communicating artery origins remain patent. Patent carotid termini. ACAs are stable and within normal limits, left A1 mildly dominant. Normal anterior communicating artery. Left MCA M1 segment, left MCA bifurcation and visible left MCA branches are stable and within normal limits. Right MCA M1 remains patent. Patent right MCA bifurcation and proximal M2 branches. Right MCA M2 vascular stent susceptibility and visible right MCA branches appear stable. IMPRESSION: 1. Progressed size and extent of infarcts widely scattered in the right MCA and PCA territories since 04/26/2020. Associated petechial hemorrhage as suspected by CT. No malignant hemorrhagic transformation. Mild mass  effect with no midline shift at this time. 2. Small new lacunar type infarcts in the left cerebellar vermis and right midbrain. Several small left MCA territory cortical infarcts again noted. 3. Stable intracranial MRA from 2 days ago. No large vessel occlusion. Stable MRA appearance of right MCA M2 stent. Electronically Signed   By: Genevie Ann M.D.   On: 04/29/2020 09:07   MR ANGIO HEAD WO CONTRAST  Result Date: 04/26/2020 CLINICAL DATA:  Stroke follow-up. EXAM: MRI HEAD WITHOUT CONTRAST MRA HEAD WITHOUT CONTRAST TECHNIQUE: Multiplanar, multiecho pulse sequences of the brain and surrounding structures were obtained without intravenous contrast. Angiographic images of the head were obtained using MRA technique without contrast. COMPARISON:  CT a of the head neck from yesterday FINDINGS: MRI HEAD FINDINGS Brain: Patchy  cortical infarction along the right MCA territory and reaching the right occipital cortex in this patient with large right posterior communicating artery. Two tiny cortical infarcts in the left MCA to distal ACA territory. No hemorrhage, mass effect, or hydrocephalus. Age normal brain volume. Mild pre-existing ischemic disease for age. Vascular: Arterial findings below. Skull and upper cervical spine: Normal marrow signal Sinuses/Orbits: Generalized mucosal thickening with fluid levels. MRA HEAD FINDINGS Right M2 stenting with expected associated artifact but visible internal flow on source images. On 3D reformats there is apparent high-grade stenosis with flow gap in the region of the M2 takeoff, but this is likely primarily from artifact based on source images. There is moderate atheromatous irregularity of bilateral MCA branches. Diffuse atheromatous irregularity of the left V4 segment and basilar with overall moderate stenosis except at the vertebrobasilar junction where narrowing appears high-grade. The right V4 segment is not seen until the patent PICA. IMPRESSION: 1. Scattered acute cortical  infarcts in the right MCA territory and right occipital lobe. Two tiny cortical infarcts in the left cerebral hemisphere. 2. Present flow in the stented right M2 branch. 3. Advanced intracranial atherosclerosis as previously characterized. 4. Sinus inflammation. Electronically Signed   By: Monte Fantasia M.D.   On: 04/26/2020 04:34   MR BRAIN WO CONTRAST  Result Date: 04/29/2020 CLINICAL DATA:  80 year old male with altered mental status. Scattered infarcts in the right hemisphere, occasionally in the left hemisphere on 04/26/2020. History of right MCA stent. Questionable petechial or subarachnoid hemorrhage along the right superior convexity on head CT yesterday. EXAM: MRI HEAD WITHOUT CONTRAST MRA HEAD WITHOUT CONTRAST TECHNIQUE: Multiplanar, multiecho pulse sequences of the brain and surrounding structures were obtained without intravenous contrast. Angiographic images of the head were obtained using MRA technique without contrast. COMPARISON:  Brain MRI and MRA 04/26/2020.  Head CT yesterday. FINDINGS: MRI HEAD FINDINGS Brain: Increased size and confluence of multifocal restricted diffusion, now moderately affecting the right hemisphere in both right MCA and right PCA vascular territories. Confluent right corona radiata and frontal operculum progression (series 2, image 34). Similar increased conspicuity of small foci along the right internal capsule. More extensive cortical involvement elsewhere including at the left occipital pole, junction of the right temporal and occipital lobes. A few small scattered areas of cortical restricted diffusion in the left superior and posterior frontal lobe are also mildly increased (e.g. Series 2, image 40). There is also a small round new focus of restricted diffusion in the central midbrain to the right (series 2, image 21). And furthermore increased conspicuity of a similar small focus of restriction in the left cerebellar vermis (image 12). No other new areas of  involvement identified. Progressed cytotoxic edema associated with the above. Mild new mass effect on the right lateral ventricle. No midline shift. Basilar cisterns remain patent. There is punctate but increased petechial or subarachnoid hemorrhage along the right central sulcus as suspected by CT (series 8, image 70 on [facility]). Stable several chronic microhemorrhages in the opposite left frontal lobe. Susceptibility artifact related to posterior right MCA branch stent. No intraventricular hemorrhage or ventriculomegaly. Cervicomedullary junction and pituitary are within normal limits. Vascular: Major intracranial vascular flow voids remain stable. Skull and upper cervical spine: Stable, negative for age. Sinuses/Orbits: Stable and negative orbits. Fluid in the nasopharynx with right nasoenteric tube in place now. Stable fluid layering in the sphenoid sinuses. Mild paranasal sinus mucosal thickening elsewhere. New left greater than right mastoid fluid. Other: None. MRA HEAD FINDINGS Source images demonstrate stable  antegrade flow signal compared to 04/26/20 throughout the anterior and posterior circulation. Diminutive vertebrobasilar system, somewhat dominant left vertebral artery. Less of the V4 segment is visible today. Distal vertebral and basilar artery irregularity but only mild associated stenosis. Basilar functionally terminates in the SCA is. Fetal type bilateral PCA origins and bilateral PCAs appears stable and within normal limits. Stable ICA siphons, mild tortuosity and irregularity without ICA stenosis. Ophthalmic and posterior communicating artery origins remain patent. Patent carotid termini. ACAs are stable and within normal limits, left A1 mildly dominant. Normal anterior communicating artery. Left MCA M1 segment, left MCA bifurcation and visible left MCA branches are stable and within normal limits. Right MCA M1 remains patent. Patent right MCA bifurcation and proximal M2 branches. Right MCA M2  vascular stent susceptibility and visible right MCA branches appear stable. IMPRESSION: 1. Progressed size and extent of infarcts widely scattered in the right MCA and PCA territories since 04/26/2020. Associated petechial hemorrhage as suspected by CT. No malignant hemorrhagic transformation. Mild mass effect with no midline shift at this time. 2. Small new lacunar type infarcts in the left cerebellar vermis and right midbrain. Several small left MCA territory cortical infarcts again noted. 3. Stable intracranial MRA from 2 days ago. No large vessel occlusion. Stable MRA appearance of right MCA M2 stent. Electronically Signed   By: Genevie Ann M.D.   On: 04/29/2020 09:07   MR BRAIN WO CONTRAST  Result Date: 04/26/2020 CLINICAL DATA:  Stroke follow-up. EXAM: MRI HEAD WITHOUT CONTRAST MRA HEAD WITHOUT CONTRAST TECHNIQUE: Multiplanar, multiecho pulse sequences of the brain and surrounding structures were obtained without intravenous contrast. Angiographic images of the head were obtained using MRA technique without contrast. COMPARISON:  CT a of the head neck from yesterday FINDINGS: MRI HEAD FINDINGS Brain: Patchy cortical infarction along the right MCA territory and reaching the right occipital cortex in this patient with large right posterior communicating artery. Two tiny cortical infarcts in the left MCA to distal ACA territory. No hemorrhage, mass effect, or hydrocephalus. Age normal brain volume. Mild pre-existing ischemic disease for age. Vascular: Arterial findings below. Skull and upper cervical spine: Normal marrow signal Sinuses/Orbits: Generalized mucosal thickening with fluid levels. MRA HEAD FINDINGS Right M2 stenting with expected associated artifact but visible internal flow on source images. On 3D reformats there is apparent high-grade stenosis with flow gap in the region of the M2 takeoff, but this is likely primarily from artifact based on source images. There is moderate atheromatous irregularity  of bilateral MCA branches. Diffuse atheromatous irregularity of the left V4 segment and basilar with overall moderate stenosis except at the vertebrobasilar junction where narrowing appears high-grade. The right V4 segment is not seen until the patent PICA. IMPRESSION: 1. Scattered acute cortical infarcts in the right MCA territory and right occipital lobe. Two tiny cortical infarcts in the left cerebral hemisphere. 2. Present flow in the stented right M2 branch. 3. Advanced intracranial atherosclerosis as previously characterized. 4. Sinus inflammation. Electronically Signed   By: Monte Fantasia M.D.   On: 04/26/2020 04:34   US RENAL  Result Date: 04/29/2020 CLINICAL DATA:  Acute kidney injury EXAM: RENAL / URINARY TRACT ULTRASOUND COMPLETE COMPARISON:  None. FINDINGS: Right Kidney: Renal measurements: 14.4 x 6 x 5.9 cm = volume: 265 mL. Cortical echogenicity normal. No hydronephrosis. Possible exophytic cyst off the midpole but poor visibility. Left Kidney: Renal measurements: 13.6 x 7.3 x 6.4 cm = volume: 332.5 mL. Echogenicity within normal limits. No hydronephrosis. Cyst at the lower pole  measuring 6.3 cm. Bladder: Not well seen and presumably empty Other: None. IMPRESSION: 1. Negative for hydronephrosis. 2. Simple appearing left renal cyst Electronically Signed   By: Donavan Foil M.D.   On: 04/29/2020 00:11   IR Intra Cran Stent  Result Date: 04/27/2020 INDICATION: 80 year old male with past medical history significant for diabetes mellitus type 2, hypertension, hyperlipidemia and atrial fibrillation status post cardioversion in November 2021 on Xarelto. Patient had an episode of transient slurred speech and mild left arm weakness on 04/24/2020. Around noon on 04/09/2020, he developed significant left arm weakness, slurred speech and left facial droop. He was taken to outside hospital where he was found to have diminished opacification of the right MCA branches on CTA with area of increased T-max in  the right MCA territory. He was taken to our service for a diagnostic cerebral angiogram and possible mechanical thrombectomy. EXAM: Ultrasound-guided vascular access Diagnostic cerebral angiogram Mechanical thrombectomy Intracranial stenting Flat panel head CT COMPARISON:  CT/CT angiogram of the head and neck April 25, 2020. MEDICATIONS: Refer to anesthesia documentation. ANESTHESIA/SEDATION: The procedure was performed under general anesthesia. CONTRAST:  75 mL of Omnipaque 240 milligrams/mL FLUOROSCOPY TIME:  Fluoroscopy Time: 113 minutes 6 seconds (2,379 mGy). COMPLICATIONS: None immediate. TECHNIQUE: Informed written consent was obtained from the patient's sister after a thorough discussion of the procedural risks, benefits and alternatives. All questions were addressed. Maximal Sterile Barrier Technique was utilized including caps, mask, sterile gowns, sterile gloves, sterile drape, hand hygiene and skin antiseptic. A timeout was performed prior to the initiation of the procedure. The right groin was prepped and draped in the usual sterile fashion. Using a micropuncture kit and the modified Seldinger technique, access was gained to the right common femoral artery and an 8 French sheath was placed. Real-time ultrasound guidance was utilized for vascular access including the acquisition of a permanent ultrasound image documenting patency of the accessed vessel. Under fluoroscopy, a 6 [place] Berenstein 2 catheter was navigated over a 0.035" Terumo Glidewire into the aortic arch. The catheter was placed into the right common carotid artery and then advanced into the right internal carotid artery. Frontal and lateral angiograms of the head were obtained. FINDINGS: 1. Near occlusion of a proximal right M2/MCA middle division branch with minimal delayed contrast penetration of distal branches. 2. Atherosclerotic changes of the right carotid siphon without hemodynamically significant stenosis. 3. Atherosclerotic  changes of the right common femoral artery without hemodynamically significant stenosis. PROCEDURE: Under biplane roadmap, the Berenstein 2 catheter was exchanged over the wire for a zoom 88 guide catheter. The catheter was placed in the upper cervical segment of the right ICA. A zoom 71 catheter was then navigated through the guide catheter into the petrous segment of the right ICA. Magnified frontal and lateral views of the head were obtained. Then a phenom 21 microcatheter was navigated over a synchro support microguidewire into the right M1/MCA. Multiple attempts to gain access to the right M2 branch proved unsuccessful. The synchro support micro guidewire was then exchanged for a 0.016" headliner micro guidewire which was advanced into the distal right M2/MCA branch. The microcatheter was then advanced over the wire. The microwire was removed and a 4 x 40 solitaire thrombectomy device was deployed spanning the right M2/MCA middle division branch and distal M1 segment. The device was allowed to intercalated with the clot for 4 minutes. The aspiration catheter was advanced to the level of occlusion. The microcatheter was removed. The aspiration catheter and thrombectomy device were removed  under constant aspiration. Follow-up right ICA angiograms with magnified frontal and lateral views of the head showed brisk opacification of the right M2/MCA middle division branch with irregularity noted at the branch origin [facility]). Delayed follow-up angiogram showed branch reocclusion. Under biplane roadmap, the zoom 71 aspiration catheter was again navigated over a phenom 21 microcatheter and a headliner microguidewire into the cavernous segment of the right ICA. The microcatheter was then navigated over the wire into the right M2/MCA middle division branch. Then, a 4 x 40 mm solitaire stent retriever was deployed spanning the M2 and distal M1 segments. The device was allowed to intercalated with the clot for 4 minutes.  The microcatheter was removed. The aspiration catheter was advanced to the level of occlusion and connected to a penumbra aspiration pump. The thrombectomy device and aspiration catheter were removed under constant aspiration. Follow-up right ICA angiograms with magnified frontal and lateral views of the head showed brisk opacification of the right M2/MCA middle division branch with irregularity noted at the branch origin [facility]). Delayed follow-up angiogram showed branch reocclusion. Under biplane roadmap, the zoom 71 aspiration catheter was again navigated over a phenom 21 microcatheter and a headliner microguidewire into the cavernous segment of the right ICA. The microcatheter was then navigated over the wire into the right M2/MCA middle division branch. Then, a 5 x 37 mm embotrap stent retriever was deployed spanning the M2 and distal M1 segments. The device was allowed to intercalated with the clot for 4 minutes. The microcatheter was removed. The aspiration catheter was advanced to the level of occlusion and connected to a penumbra aspiration pump. The thrombectomy device and aspiration catheter were removed under constant aspiration. Follow-up right ICA angiograms with magnified frontal and lateral views of the head showed recanalization of the right M2/MCA middle division branch with hairline stenosis at its origin due to atherosclerotic plaque and stenosis at the mid M2 segment with very delayed opacification of distal branches. Flat panel CT of the head was obtained and post processed in a separate workstation with concurrent attending physician supervision. Selected images were sent to PACS. No evidence of hemorrhagic complication noted. Under biplane roadmap, the zoom 71 aspiration catheter was again navigated over a phenom 21 microcatheter and a Aristotle 14 microguidewire into the cavernous segment of the right ICA. The microcatheter was then navigated over the wire into the right M2/MCA middle  division branch. The Aristotle microguidewire was exchanged for a an exchange length synchro microguidewire. At this point, patient developed severe bradycardia and eventually asystole. The microwire and microcatheter were removed. The table was lowered to start chest compressions. However, patient had spontaneous return of circulation before the start of resuscitation efforts. Follow-up right ICA angiograms with magnified frontal and lateral views of the head again showed recanalization of the right M2/MCA middle division branch with with hairline stenosis. No evidence of contrast extravasation. Under biplane roadmap, an SL 10 microcatheter was navigated over Aristotle 14 microguidewire into the right M2/MCA middle division branch. Subsequently, a 4.5 x 30 mm neuroform atlas stent was deployed spanning the distal right M1, proximal and mid M2/MCA middle division branch. Follow-up angiogram showed brisk flow through the entire right MCA territory. The catheter construct was subsequently withdrawn. A right common femoral angiogram was performed in right anterior oblique view. The puncture is at the common femoral artery level. Atherosclerotic plaques are noted without hemodynamically significant stenosis. The sheath was exchanged for an 8 [place] Angio-Seal which was utilized for access closure. Immediate hemostasis was achieved.  IMPRESSION: Mechanical thrombectomy and intracranial stenting performed for treatment of a proximal right M2/MCA middle division branch near occlusion with superimposed atherosclerotic plaque. A total of 3 passes performed with stent retriever followed by intracranial stenting. Intracranial stenting performed after bolus infusion of cangrelor followed by low-dose protocol drip. PLAN: Patient remained intubated and transferred to ICU given hemodynamic instability. Continue cangrelor drip until transition to aspirin and Brilinta. Family updated by telephone immediately after the end of the  procedure. Electronically Signed   By: Pedro Earls M.D.   On: 04/27/2020 10:36   IR CT Head Ltd  Result Date: 04/27/2020 INDICATION: 80 year old male with past medical history significant for diabetes mellitus type 2, hypertension, hyperlipidemia and atrial fibrillation status post cardioversion in November 2021 on Xarelto. Patient had an episode of transient slurred speech and mild left arm weakness on 04/24/2020. Around noon on 05/03/2020, he developed significant left arm weakness, slurred speech and left facial droop. He was taken to outside hospital where he was found to have diminished opacification of the right MCA branches on CTA with area of increased T-max in the right MCA territory. He was taken to our service for a diagnostic cerebral angiogram and possible mechanical thrombectomy. EXAM: Ultrasound-guided vascular access Diagnostic cerebral angiogram Mechanical thrombectomy Intracranial stenting Flat panel head CT COMPARISON:  CT/CT angiogram of the head and neck April 25, 2020. MEDICATIONS: Refer to anesthesia documentation. ANESTHESIA/SEDATION: The procedure was performed under general anesthesia. CONTRAST:  75 mL of Omnipaque 240 milligrams/mL FLUOROSCOPY TIME:  Fluoroscopy Time: 113 minutes 6 seconds (2,379 mGy). COMPLICATIONS: None immediate. TECHNIQUE: Informed written consent was obtained from the patient's sister after a thorough discussion of the procedural risks, benefits and alternatives. All questions were addressed. Maximal Sterile Barrier Technique was utilized including caps, mask, sterile gowns, sterile gloves, sterile drape, hand hygiene and skin antiseptic. A timeout was performed prior to the initiation of the procedure. The right groin was prepped and draped in the usual sterile fashion. Using a micropuncture kit and the modified Seldinger technique, access was gained to the right common femoral artery and an 8 French sheath was placed. Real-time ultrasound  guidance was utilized for vascular access including the acquisition of a permanent ultrasound image documenting patency of the accessed vessel. Under fluoroscopy, a 6 [place] Berenstein 2 catheter was navigated over a 0.035" Terumo Glidewire into the aortic arch. The catheter was placed into the right common carotid artery and then advanced into the right internal carotid artery. Frontal and lateral angiograms of the head were obtained. FINDINGS: 1. Near occlusion of a proximal right M2/MCA middle division branch with minimal delayed contrast penetration of distal branches. 2. Atherosclerotic changes of the right carotid siphon without hemodynamically significant stenosis. 3. Atherosclerotic changes of the right common femoral artery without hemodynamically significant stenosis. PROCEDURE: Under biplane roadmap, the Berenstein 2 catheter was exchanged over the wire for a zoom 88 guide catheter. The catheter was placed in the upper cervical segment of the right ICA. A zoom 71 catheter was then navigated through the guide catheter into the petrous segment of the right ICA. Magnified frontal and lateral views of the head were obtained. Then a phenom 21 microcatheter was navigated over a synchro support microguidewire into the right M1/MCA. Multiple attempts to gain access to the right M2 branch proved unsuccessful. The synchro support micro guidewire was then exchanged for a 0.016" headliner micro guidewire which was advanced into the distal right M2/MCA branch. The microcatheter was then advanced over the wire.  The microwire was removed and a 4 x 40 solitaire thrombectomy device was deployed spanning the right M2/MCA middle division branch and distal M1 segment. The device was allowed to intercalated with the clot for 4 minutes. The aspiration catheter was advanced to the level of occlusion. The microcatheter was removed. The aspiration catheter and thrombectomy device were removed under constant aspiration. Follow-up  right ICA angiograms with magnified frontal and lateral views of the head showed brisk opacification of the right M2/MCA middle division branch with irregularity noted at the branch origin [facility]). Delayed follow-up angiogram showed branch reocclusion. Under biplane roadmap, the zoom 71 aspiration catheter was again navigated over a phenom 21 microcatheter and a headliner microguidewire into the cavernous segment of the right ICA. The microcatheter was then navigated over the wire into the right M2/MCA middle division branch. Then, a 4 x 40 mm solitaire stent retriever was deployed spanning the M2 and distal M1 segments. The device was allowed to intercalated with the clot for 4 minutes. The microcatheter was removed. The aspiration catheter was advanced to the level of occlusion and connected to a penumbra aspiration pump. The thrombectomy device and aspiration catheter were removed under constant aspiration. Follow-up right ICA angiograms with magnified frontal and lateral views of the head showed brisk opacification of the right M2/MCA middle division branch with irregularity noted at the branch origin [facility]). Delayed follow-up angiogram showed branch reocclusion. Under biplane roadmap, the zoom 71 aspiration catheter was again navigated over a phenom 21 microcatheter and a headliner microguidewire into the cavernous segment of the right ICA. The microcatheter was then navigated over the wire into the right M2/MCA middle division branch. Then, a 5 x 37 mm embotrap stent retriever was deployed spanning the M2 and distal M1 segments. The device was allowed to intercalated with the clot for 4 minutes. The microcatheter was removed. The aspiration catheter was advanced to the level of occlusion and connected to a penumbra aspiration pump. The thrombectomy device and aspiration catheter were removed under constant aspiration. Follow-up right ICA angiograms with magnified frontal and lateral views of the head showed  recanalization of the right M2/MCA middle division branch with hairline stenosis at its origin due to atherosclerotic plaque and stenosis at the mid M2 segment with very delayed opacification of distal branches. Flat panel CT of the head was obtained and post processed in a separate workstation with concurrent attending physician supervision. Selected images were sent to PACS. No evidence of hemorrhagic complication noted. Under biplane roadmap, the zoom 71 aspiration catheter was again navigated over a phenom 21 microcatheter and a Aristotle 14 microguidewire into the cavernous segment of the right ICA. The microcatheter was then navigated over the wire into the right M2/MCA middle division branch. The Aristotle microguidewire was exchanged for a an exchange length synchro microguidewire. At this point, patient developed severe bradycardia and eventually asystole. The microwire and microcatheter were removed. The table was lowered to start chest compressions. However, patient had spontaneous return of circulation before the start of resuscitation efforts. Follow-up right ICA angiograms with magnified frontal and lateral views of the head again showed recanalization of the right M2/MCA middle division branch with with hairline stenosis. No evidence of contrast extravasation. Under biplane roadmap, an SL 10 microcatheter was navigated over Aristotle 14 microguidewire into the right M2/MCA middle division branch. Subsequently, a 4.5 x 30 mm neuroform atlas stent was deployed spanning the distal right M1, proximal and mid M2/MCA middle division branch. Follow-up angiogram showed brisk flow through  the entire right MCA territory. The catheter construct was subsequently withdrawn. A right common femoral angiogram was performed in right anterior oblique view. The puncture is at the common femoral artery level. Atherosclerotic plaques are noted without hemodynamically significant stenosis. The sheath was exchanged for an 8  [place] Angio-Seal which was utilized for access closure. Immediate hemostasis was achieved. IMPRESSION: Mechanical thrombectomy and intracranial stenting performed for treatment of a proximal right M2/MCA middle division branch near occlusion with superimposed atherosclerotic plaque. A total of 3 passes performed with stent retriever followed by intracranial stenting. Intracranial stenting performed after bolus infusion of cangrelor followed by low-dose protocol drip. PLAN: Patient remained intubated and transferred to ICU given hemodynamic instability. Continue cangrelor drip until transition to aspirin and Brilinta. Family updated by telephone immediately after the end of the procedure. Electronically Signed   By: Pedro Earls M.D.   On: 04/27/2020 10:36   IR US Guide Vasc Access Right  Result Date: 04/27/2020 INDICATION: 80 year old male with past medical history significant for diabetes mellitus type 2, hypertension, hyperlipidemia and atrial fibrillation status post cardioversion in November 2021 on Xarelto. Patient had an episode of transient slurred speech and mild left arm weakness on 04/24/2020. Around noon on 04/09/2020, he developed significant left arm weakness, slurred speech and left facial droop. He was taken to outside hospital where he was found to have diminished opacification of the right MCA branches on CTA with area of increased T-max in the right MCA territory. He was taken to our service for a diagnostic cerebral angiogram and possible mechanical thrombectomy. EXAM: Ultrasound-guided vascular access Diagnostic cerebral angiogram Mechanical thrombectomy Intracranial stenting Flat panel head CT COMPARISON:  CT/CT angiogram of the head and neck April 25, 2020. MEDICATIONS: Refer to anesthesia documentation. ANESTHESIA/SEDATION: The procedure was performed under general anesthesia. CONTRAST:  75 mL of Omnipaque 240 milligrams/mL FLUOROSCOPY TIME:  Fluoroscopy Time: 113  minutes 6 seconds (2,379 mGy). COMPLICATIONS: None immediate. TECHNIQUE: Informed written consent was obtained from the patient's sister after a thorough discussion of the procedural risks, benefits and alternatives. All questions were addressed. Maximal Sterile Barrier Technique was utilized including caps, mask, sterile gowns, sterile gloves, sterile drape, hand hygiene and skin antiseptic. A timeout was performed prior to the initiation of the procedure. The right groin was prepped and draped in the usual sterile fashion. Using a micropuncture kit and the modified Seldinger technique, access was gained to the right common femoral artery and an 8 French sheath was placed. Real-time ultrasound guidance was utilized for vascular access including the acquisition of a permanent ultrasound image documenting patency of the accessed vessel. Under fluoroscopy, a 6 [place] Berenstein 2 catheter was navigated over a 0.035" Terumo Glidewire into the aortic arch. The catheter was placed into the right common carotid artery and then advanced into the right internal carotid artery. Frontal and lateral angiograms of the head were obtained. FINDINGS: 1. Near occlusion of a proximal right M2/MCA middle division branch with minimal delayed contrast penetration of distal branches. 2. Atherosclerotic changes of the right carotid siphon without hemodynamically significant stenosis. 3. Atherosclerotic changes of the right common femoral artery without hemodynamically significant stenosis. PROCEDURE: Under biplane roadmap, the Berenstein 2 catheter was exchanged over the wire for a zoom 88 guide catheter. The catheter was placed in the upper cervical segment of the right ICA. A zoom 71 catheter was then navigated through the guide catheter into the petrous segment of the right ICA. Magnified frontal and lateral views of the head  were obtained. Then a phenom 21 microcatheter was navigated over a synchro support microguidewire into the  right M1/MCA. Multiple attempts to gain access to the right M2 branch proved unsuccessful. The synchro support micro guidewire was then exchanged for a 0.016" headliner micro guidewire which was advanced into the distal right M2/MCA branch. The microcatheter was then advanced over the wire. The microwire was removed and a 4 x 40 solitaire thrombectomy device was deployed spanning the right M2/MCA middle division branch and distal M1 segment. The device was allowed to intercalated with the clot for 4 minutes. The aspiration catheter was advanced to the level of occlusion. The microcatheter was removed. The aspiration catheter and thrombectomy device were removed under constant aspiration. Follow-up right ICA angiograms with magnified frontal and lateral views of the head showed brisk opacification of the right M2/MCA middle division branch with irregularity noted at the branch origin [facility]). Delayed follow-up angiogram showed branch reocclusion. Under biplane roadmap, the zoom 71 aspiration catheter was again navigated over a phenom 21 microcatheter and a headliner microguidewire into the cavernous segment of the right ICA. The microcatheter was then navigated over the wire into the right M2/MCA middle division branch. Then, a 4 x 40 mm solitaire stent retriever was deployed spanning the M2 and distal M1 segments. The device was allowed to intercalated with the clot for 4 minutes. The microcatheter was removed. The aspiration catheter was advanced to the level of occlusion and connected to a penumbra aspiration pump. The thrombectomy device and aspiration catheter were removed under constant aspiration. Follow-up right ICA angiograms with magnified frontal and lateral views of the head showed brisk opacification of the right M2/MCA middle division branch with irregularity noted at the branch origin [facility]). Delayed follow-up angiogram showed branch reocclusion. Under biplane roadmap, the zoom 71 aspiration  catheter was again navigated over a phenom 21 microcatheter and a headliner microguidewire into the cavernous segment of the right ICA. The microcatheter was then navigated over the wire into the right M2/MCA middle division branch. Then, a 5 x 37 mm embotrap stent retriever was deployed spanning the M2 and distal M1 segments. The device was allowed to intercalated with the clot for 4 minutes. The microcatheter was removed. The aspiration catheter was advanced to the level of occlusion and connected to a penumbra aspiration pump. The thrombectomy device and aspiration catheter were removed under constant aspiration. Follow-up right ICA angiograms with magnified frontal and lateral views of the head showed recanalization of the right M2/MCA middle division branch with hairline stenosis at its origin due to atherosclerotic plaque and stenosis at the mid M2 segment with very delayed opacification of distal branches. Flat panel CT of the head was obtained and post processed in a separate workstation with concurrent attending physician supervision. Selected images were sent to PACS. No evidence of hemorrhagic complication noted. Under biplane roadmap, the zoom 71 aspiration catheter was again navigated over a phenom 21 microcatheter and a Aristotle 14 microguidewire into the cavernous segment of the right ICA. The microcatheter was then navigated over the wire into the right M2/MCA middle division branch. The Aristotle microguidewire was exchanged for a an exchange length synchro microguidewire. At this point, patient developed severe bradycardia and eventually asystole. The microwire and microcatheter were removed. The table was lowered to start chest compressions. However, patient had spontaneous return of circulation before the start of resuscitation efforts. Follow-up right ICA angiograms with magnified frontal and lateral views of the head again showed recanalization of the right M2/MCA  middle division branch with  with hairline stenosis. No evidence of contrast extravasation. Under biplane roadmap, an SL 10 microcatheter was navigated over Aristotle 14 microguidewire into the right M2/MCA middle division branch. Subsequently, a 4.5 x 30 mm neuroform atlas stent was deployed spanning the distal right M1, proximal and mid M2/MCA middle division branch. Follow-up angiogram showed brisk flow through the entire right MCA territory. The catheter construct was subsequently withdrawn. A right common femoral angiogram was performed in right anterior oblique view. The puncture is at the common femoral artery level. Atherosclerotic plaques are noted without hemodynamically significant stenosis. The sheath was exchanged for an 8 [place] Angio-Seal which was utilized for access closure. Immediate hemostasis was achieved. IMPRESSION: Mechanical thrombectomy and intracranial stenting performed for treatment of a proximal right M2/MCA middle division branch near occlusion with superimposed atherosclerotic plaque. A total of 3 passes performed with stent retriever followed by intracranial stenting. Intracranial stenting performed after bolus infusion of cangrelor followed by low-dose protocol drip. PLAN: Patient remained intubated and transferred to ICU given hemodynamic instability. Continue cangrelor drip until transition to aspirin and Brilinta. Family updated by telephone immediately after the end of the procedure. Electronically Signed   By: Pedro Earls M.D.   On: 04/27/2020 10:36   CT CEREBRAL PERFUSION W CONTRAST  Result Date: 04/24/2020 CLINICAL DATA:  Slurred speech and left-sided weakness. EXAM: CT ANGIOGRAPHY HEAD AND NECK CT PERFUSION BRAIN TECHNIQUE: Multidetector CT imaging of the head and neck was performed using the standard protocol during bolus administration of intravenous contrast. Multiplanar CT image reconstructions and MIPs were obtained to evaluate the vascular anatomy. Carotid stenosis  measurements (when applicable) are obtained utilizing NASCET criteria, using the distal internal carotid diameter as the denominator. Multiphase CT imaging of the brain was performed following IV bolus contrast injection. Subsequent parametric perfusion maps were calculated using RAPID software. CONTRAST:  4m OMNIPAQUE IOHEXOL 350 MG/ML SOLN COMPARISON:  None. FINDINGS: CT HEAD FINDINGS Brain: There is no evidence of an acute infarct, intracranial hemorrhage, mass, midline shift, or extra-axial fluid collection. Mild cerebral atrophy is within normal limits for age. Patchy hypodensities in the cerebral white matter bilaterally are nonspecific but compatible with mild chronic small vessel ischemic disease. There is a chronic lacunar infarct at the inferior aspect of the right caudate head. Vascular: Calcified atherosclerosis at the skull base. No hyperdense vessel. Skull: No fracture or suspicious osseous lesion. Sinuses/Orbits: Mild scattered mucosal thickening in the paranasal sinuses. Clear mastoid air cells. Bilateral cataract extraction. Other: None. ASPECTS (AJensenStroke Program Early CT Score) - Ganglionic level infarction (caudate, lentiform nuclei, internal capsule, insula, M1-M3 cortex): 7 - Supraganglionic infarction (M4-M6 cortex): 3 Total score (0-10 with 10 being normal): 10 Review of the MIP images confirms the above findings CTA NECK FINDINGS Aortic arch: Normal variant aortic arch branching pattern with common origin of the brachiocephalic and left common carotid arteries. Mild calcified plaque in the aortic arch without evidence of a significant arch vessel origin stenosis. Right carotid system: Patent with mild scattered predominantly calcified plaque throughout the common carotid artery and at the carotid bifurcation. No evidence of a significant stenosis or dissection. Left carotid system: Patent with mild plaque in the mid common carotid artery not resulting in significant stenosis. More  extensive, heavily calcified plaque at the carotid bifurcation results in less than 50% proximal ICA stenosis. No evidence of dissection. Vertebral arteries: There is either severe stenosis or short segment occlusion of the proximal left vertebral artery at its  origin with the vessel being patent throughout the remainder of the neck. There is a diffusely thready appearance of the right vertebral artery V1 and V2 segments with the right V3 segment being occluded. Skeleton: Advanced cervical disc and facet degeneration. Other neck: No evidence of cervical lymphadenopathy or mass. Upper chest: Partially visualized small right pleural effusion. No apical lung consolidation. Review of the MIP images confirms the above findings CTA HEAD FINDINGS Anterior circulation: The internal carotid arteries are patent from skull base to carotid termini with calcified plaque resulting in mild cavernous and proximal supraclinoid stenosis bilaterally. The right M1 segment is patent with a mild stenosis proximally. There is a decreased number of distal right MCA branch vessels compared to the left, however a discrete proximal branch occlusion is not identified. The left MCA is patent with branch vessel irregularity as well as a mild-to-moderate proximal M2 stenosis. Both ACAs are patent without evidence of a significant proximal stenosis. No aneurysm is identified. Posterior circulation: The intracranial left vertebral artery is patent but diffusely irregular and narrowed with multiple moderate to severe stenoses. The right V4 segment is occluded proximally with likely retrograde filling distally supplying the right PICA. 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 Patent SCAs are seen bilaterally. There is a fetal origin of the PCAs without evidence of a significant proximal PCA stenosis. No aneurysm is identified. Venous  sinuses: Patent. Anatomic variants: Fetal origin of the PCAs. Review of the MIP images confirms the above findings CT Brain Perfusion Findings: ASPECTS: 10 CBF (<30%) Volume: 0 mL Perfusion (Tmax>6.0s) volume: 55 mL Mismatch Volume: 55 mL Infarction Location: No core infarct evident by CTP. Ischemic penumbra in the right MCA territory, greatest in the frontal lobe at and superior to the operculum though with parietal and temporal lobe involvement as well. IMPRESSION: CT HEAD: 1. No evidence of acute intracranial abnormality.  ASPECTS of 10. 2. Mild chronic small vessel ischemic disease. CTA HEAD AND NECK: 1. Decreased number of right MCA branch vessels without a discrete proximal occlusion identified. It is possible that this reflects a stump occlusion of a proximal to mid M2 branch which is not readily apparent by CTA. 2. Intracranial atherosclerosis with multiple anterior and posterior circulation stenoses as detailed above. 3. 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 4. Severe stenosis or short segment occlusion of the left vertebral artery at its origin with patency of the vessel more distally. Heavily diseased left V4 segment. 5. Cervical carotid atherosclerosis without significant stenosis. 6.  Aortic Atherosclerosis (ICD10-I70.0). CT BRAIN PERFUSION: 55 mL of ischemic penumbra in the right MCA territory without evidence of a core infarct. These results were called by telephone at the time of interpretation on 04/09/2020 at 2:15 pm to Dr. Donnetta Simpers, who verbally acknowledged these results. Electronically Signed   By: Logan Bores M.D.   On: 04/17/2020 14:58   DG CHEST PORT 1 VIEW  Result Date: 04/28/2020 CLINICAL DATA:  Assess positioning of hemodialysis catheter. EXAM: PORTABLE CHEST 1 VIEW COMPARISON:  Same day chest radiograph at 0900 hours. FINDINGS: Right IJ central venous catheter with tip overlying the superior vena cava. Endotracheal tube with tip overlying the distal thoracic  trachea. Enteric tube coursing below the diaphragm with tip obscured by collimation. The heart size and mediastinal contours are unchanged. Aortic atherosclerosis. Similar bilateral hilar airspace opacities. No significant pleural effusion or visible pneumothorax. The visualized skeletal structures are unchanged.  IMPRESSION: 1. Right IJ since venous catheter with tip overlying the superior vena cava. No visible pneumothorax. Electronically Signed   By: Dahlia Bailiff MD   On: 04/28/2020 12:27   DG CHEST PORT 1 VIEW  Result Date: 04/28/2020 CLINICAL DATA:  Hypoxia EXAM: PORTABLE CHEST 1 VIEW COMPARISON:  April 26, 2020 FINDINGS: Endotracheal tube tip is 2.2 cm above the carina. Enteric tube tip is below the diaphragm. No pneumothorax. There is airspace opacity overlying the right hilum, a change from 2 days prior. There is bibasilar atelectasis. Heart size and pulmonary vascularity are normal. No adenopathy. No bone lesions. There is aortic atherosclerosis. IMPRESSION: Tube positions as described without pneumothorax. Suspect pneumonia right perihilar region given new opacity in this area compared to 2 days prior. Bibasilar atelectasis. Stable cardiac silhouette. Aortic Atherosclerosis (ICD10-I70.0). Electronically Signed   By: Lowella Grip III M.D.   On: 04/28/2020 09:47   DG CHEST PORT 1 VIEW  Result Date: 04/26/2020 CLINICAL DATA:  Hypoxia EXAM: PORTABLE CHEST 1 VIEW COMPARISON:  04/14/2020 FINDINGS: The endotracheal tube terminates above the carina. There is elevation of the right hemidiaphragm as before. There are persistent but improving perihilar airspace opacities. There is no pneumothorax. No definite acute displaced fracture. No large pleural effusion. IMPRESSION: 1. Improving bilateral perihilar airspace opacities. 2. Endotracheal tube as above. Electronically Signed   By: Constance Holster M.D.   On: 04/26/2020 15:20   DG CHEST PORT 1 VIEW  Result Date: 04/19/2020 CLINICAL DATA:   Endotracheal placement EXAM: PORTABLE CHEST 1 VIEW COMPARISON:  Earlier same day FINDINGS: Endotracheal tube tip 4 cm above the carina. There is worsened atelectasis in the perihilar regions both sides. Possible pulmonary venous hypertension without Zykeem edema. No visible effusion. IMPRESSION: Endotracheal tube tip 4 cm above the carina. Worsened perihilar atelectasis. Possible pulmonary venous hypertension. No Leoncio edema. No visible effusion or pneumothorax. Electronically Signed   By: Nelson Chimes M.D.   On: 04/30/2020 19:29   DG Abd Portable 1V  Result Date: 04/26/2020 CLINICAL DATA:  OG tube placement. EXAM: PORTABLE ABDOMEN - 1 VIEW COMPARISON:  None. FINDINGS: An enteric tube is identified with tip overlying the proximal-mid stomach. IMPRESSION: Enteric tube with tip overlying the proximal-mid stomach. Electronically Signed   By: Margarette Canada M.D.   On: 04/26/2020 09:28   ECHOCARDIOGRAM COMPLETE  Result Date: 04/27/2020    ECHOCARDIOGRAM REPORT   Patient Name:   GEVON MARKUS Date of Exam: 04/27/2020 Medical Rec #:  [id number]       Height:       73.0 in Accession #:    [id number]      Weight:       249.1 lb Date of Birth:  Aug 31, 1940       BSA:          2.362 m Patient Age:    80 years        BP:           118/66 mmHg Patient Gender: M               HR:           81 bpm. Exam Location:  Inpatient Procedure: 2D Echo, Cardiac Doppler, Color Doppler and Intracardiac            Opacification Agent Indications:    Stroke  History:        Patient has prior history of Echocardiogram examinations, most  recent 03/14/2017. Abnormal ECG, Stroke, Arrythmias:Atrial                 Fibrillation, Signs/Symptoms:Dyspnea and Shortness of Breath;                 Risk Factors:Hypertension, Diabetes and Dyslipidemia.  Sonographer:    Roseanna Rainbow RDCS Referring Phys: [id number] [facility]  Sonographer Comments: Technically difficult study due to poor echo windows, patient is morbidly obese and echo performed  with patient supine and on artificial respirator. Image acquisition challenging due to patient body habitus. IMPRESSIONS  1. Poor acoustic windows and ectopy make accurate evaluation of LVEF difficult Definity used Overall LVEF is probably normal to mildly decreased Consider repeat when patient extubated, able to turn . There is moderate left ventricular hypertrophy. Left ventricular diastolic function could not be evaluated.  2. Right ventricular systolic function is mildly reduced. The right ventricular size is normal. Mildly increased right ventricular wall thickness.  3. The mitral valve is normal in structure. No evidence of mitral valve regurgitation.  4. The aortic valve is tricuspid. Aortic valve regurgitation is not visualized. Mild to moderate aortic valve sclerosis/calcification is present, without any evidence of aortic stenosis.  5. The inferior vena cava is dilated in size with <50% respiratory variability, suggesting right atrial pressure of 15 mmHg. FINDINGS  Left Ventricle: Poor acoustic windows and ectopy make accurate evaluation of LVEF difficult Definity used Overall LVEF is probably normal to mildly decreased Consider repeat when patient extubated, able to turn. Definity contrast agent was given IV to delineate the left ventricular endocardial borders. The left ventricular internal cavity size was normal in size. There is moderate left ventricular hypertrophy. Left ventricular diastolic function could not be evaluated. Right Ventricle: The right ventricular size is normal. Mildly increased right ventricular wall thickness. Right ventricular systolic function is mildly reduced. Left Atrium: Left atrial size was normal in size. Right Atrium: Right atrial size was normal in size. Pericardium: There is no evidence of pericardial effusion. Presence of pericardial fat pad. Mitral Valve: The mitral valve is normal in structure. No evidence of mitral valve regurgitation. Tricuspid Valve: The tricuspid  valve is normal in structure. Tricuspid valve regurgitation is trivial. Aortic Valve: The aortic valve is tricuspid. Aortic valve regurgitation is not visualized. Mild to moderate aortic valve sclerosis/calcification is present, without any evidence of aortic stenosis. Pulmonic Valve: The pulmonic valve was not well visualized. Pulmonic valve regurgitation is not visualized. Aorta: The aortic root is normal in size and structure. Venous: The inferior vena cava is dilated in size with less than 50% respiratory variability, suggesting right atrial pressure of 15 mmHg. IAS/Shunts: The interatrial septum was not assessed.  LEFT VENTRICLE PLAX 2D LVIDd:         4.80 cm LVIDs:         3.90 cm LV PW:         2.00 cm LV IVS:        1.40 cm LVOT diam:     2.10 cm LV SV:         51 LV SV Index:   21 LVOT Area:     3.46 cm  LV Volumes (MOD) LV vol d, MOD A2C: 103.0 ml LV vol d, MOD A4C: 169.0 ml LV vol s, MOD A2C: 25.8 ml LV vol s, MOD A4C: 70.3 ml LV SV MOD A2C:     77.2 ml LV SV MOD A4C:     169.0 ml LV SV MOD BP:  92.2 ml RIGHT VENTRICLE            IVC RV S prime:     7.29 cm/s  IVC diam: 3.20 cm TAPSE (M-mode): 1.6 cm LEFT ATRIUM             Index       RIGHT ATRIUM           Index LA diam:        5.30 cm 2.24 cm/m  RA Area:     17.00 cm LA Vol (A2C):   69.6 ml 29.46 ml/m RA Volume:   48.20 ml  20.41 ml/m LA Vol (A4C):   94.2 ml 39.88 ml/m LA Biplane Vol: 83.1 ml 35.18 ml/m  AORTIC VALVE LVOT Vmax:   90.80 cm/s LVOT Vmean:  65.000 cm/s LVOT VTI:    0.146 m  AORTA Ao Root diam: 3.70 cm Ao Asc diam:  3.60 cm MITRAL VALVE MV Area (PHT): 5.38 cm     SHUNTS MV Decel Time: 141 msec     Systemic VTI:  0.15 m MV E velocity: 107.90 cm/s  Systemic Diam: 2.10 cm Dorris Carnes MD Electronically signed by Dorris Carnes MD Signature Date/Time: 04/27/2020/7:51:44 PM    Final    IR PERCUTANEOUS ART THROMBECTOMY/INFUSION INTRACRANIAL INC DIAG ANGIO  Result Date: 04/27/2020 INDICATION: 80 year old male with past medical history  significant for diabetes mellitus type 2, hypertension, hyperlipidemia and atrial fibrillation status post cardioversion in November 2021 on Xarelto. Patient had an episode of transient slurred speech and mild left arm weakness on 04/24/2020. Around noon on 04/10/2020, he developed significant left arm weakness, slurred speech and left facial droop. He was taken to outside hospital where he was found to have diminished opacification of the right MCA branches on CTA with area of increased T-max in the right MCA territory. He was taken to our service for a diagnostic cerebral angiogram and possible mechanical thrombectomy. EXAM: Ultrasound-guided vascular access Diagnostic cerebral angiogram Mechanical thrombectomy Intracranial stenting Flat panel head CT COMPARISON:  CT/CT angiogram of the head and neck April 25, 2020. MEDICATIONS: Refer to anesthesia documentation. ANESTHESIA/SEDATION: The procedure was performed under general anesthesia. CONTRAST:  75 mL of Omnipaque 240 milligrams/mL FLUOROSCOPY TIME:  Fluoroscopy Time: 113 minutes 6 seconds (2,379 mGy). COMPLICATIONS: None immediate. TECHNIQUE: Informed written consent was obtained from the patient's sister after a thorough discussion of the procedural risks, benefits and alternatives. All questions were addressed. Maximal Sterile Barrier Technique was utilized including caps, mask, sterile gowns, sterile gloves, sterile drape, hand hygiene and skin antiseptic. A timeout was performed prior to the initiation of the procedure. The right groin was prepped and draped in the usual sterile fashion. Using a micropuncture kit and the modified Seldinger technique, access was gained to the right common femoral artery and an 8 French sheath was placed. Real-time ultrasound guidance was utilized for vascular access including the acquisition of a permanent ultrasound image documenting patency of the accessed vessel. Under fluoroscopy, a 6 [place] Berenstein 2 catheter was  navigated over a 0.035" Terumo Glidewire into the aortic arch. The catheter was placed into the right common carotid artery and then advanced into the right internal carotid artery. Frontal and lateral angiograms of the head were obtained. FINDINGS: 1. Near occlusion of a proximal right M2/MCA middle division branch with minimal delayed contrast penetration of distal branches. 2. Atherosclerotic changes of the right carotid siphon without hemodynamically significant stenosis. 3. Atherosclerotic changes of the right common femoral artery without hemodynamically significant stenosis.  PROCEDURE: Under biplane roadmap, the Berenstein 2 catheter was exchanged over the wire for a zoom 88 guide catheter. The catheter was placed in the upper cervical segment of the right ICA. A zoom 71 catheter was then navigated through the guide catheter into the petrous segment of the right ICA. Magnified frontal and lateral views of the head were obtained. Then a phenom 21 microcatheter was navigated over a synchro support microguidewire into the right M1/MCA. Multiple attempts to gain access to the right M2 branch proved unsuccessful. The synchro support micro guidewire was then exchanged for a 0.016" headliner micro guidewire which was advanced into the distal right M2/MCA branch. The microcatheter was then advanced over the wire. The microwire was removed and a 4 x 40 solitaire thrombectomy device was deployed spanning the right M2/MCA middle division branch and distal M1 segment. The device was allowed to intercalated with the clot for 4 minutes. The aspiration catheter was advanced to the level of occlusion. The microcatheter was removed. The aspiration catheter and thrombectomy device were removed under constant aspiration. Follow-up right ICA angiograms with magnified frontal and lateral views of the head showed brisk opacification of the right M2/MCA middle division branch with irregularity noted at the branch origin [facility]).  Delayed follow-up angiogram showed branch reocclusion. Under biplane roadmap, the zoom 71 aspiration catheter was again navigated over a phenom 21 microcatheter and a headliner microguidewire into the cavernous segment of the right ICA. The microcatheter was then navigated over the wire into the right M2/MCA middle division branch. Then, a 4 x 40 mm solitaire stent retriever was deployed spanning the M2 and distal M1 segments. The device was allowed to intercalated with the clot for 4 minutes. The microcatheter was removed. The aspiration catheter was advanced to the level of occlusion and connected to a penumbra aspiration pump. The thrombectomy device and aspiration catheter were removed under constant aspiration. Follow-up right ICA angiograms with magnified frontal and lateral views of the head showed brisk opacification of the right M2/MCA middle division branch with irregularity noted at the branch origin [facility]). Delayed follow-up angiogram showed branch reocclusion. Under biplane roadmap, the zoom 71 aspiration catheter was again navigated over a phenom 21 microcatheter and a headliner microguidewire into the cavernous segment of the right ICA. The microcatheter was then navigated over the wire into the right M2/MCA middle division branch. Then, a 5 x 37 mm embotrap stent retriever was deployed spanning the M2 and distal M1 segments. The device was allowed to intercalated with the clot for 4 minutes. The microcatheter was removed. The aspiration catheter was advanced to the level of occlusion and connected to a penumbra aspiration pump. The thrombectomy device and aspiration catheter were removed under constant aspiration. Follow-up right ICA angiograms with magnified frontal and lateral views of the head showed recanalization of the right M2/MCA middle division branch with hairline stenosis at its origin due to atherosclerotic plaque and stenosis at the mid M2 segment with very delayed opacification of  distal branches. Flat panel CT of the head was obtained and post processed in a separate workstation with concurrent attending physician supervision. Selected images were sent to PACS. No evidence of hemorrhagic complication noted. Under biplane roadmap, the zoom 71 aspiration catheter was again navigated over a phenom 21 microcatheter and a Aristotle 14 microguidewire into the cavernous segment of the right ICA. The microcatheter was then navigated over the wire into the right M2/MCA middle division branch. The Aristotle microguidewire was exchanged for a an exchange length  synchro microguidewire. At this point, patient developed severe bradycardia and eventually asystole. The microwire and microcatheter were removed. The table was lowered to start chest compressions. However, patient had spontaneous return of circulation before the start of resuscitation efforts. Follow-up right ICA angiograms with magnified frontal and lateral views of the head again showed recanalization of the right M2/MCA middle division branch with with hairline stenosis. No evidence of contrast extravasation. Under biplane roadmap, an SL 10 microcatheter was navigated over Aristotle 14 microguidewire into the right M2/MCA middle division branch. Subsequently, a 4.5 x 30 mm neuroform atlas stent was deployed spanning the distal right M1, proximal and mid M2/MCA middle division branch. Follow-up angiogram showed brisk flow through the entire right MCA territory. The catheter construct was subsequently withdrawn. A right common femoral angiogram was performed in right anterior oblique view. The puncture is at the common femoral artery level. Atherosclerotic plaques are noted without hemodynamically significant stenosis. The sheath was exchanged for an 8 [place] Angio-Seal which was utilized for access closure. Immediate hemostasis was achieved. IMPRESSION: Mechanical thrombectomy and intracranial stenting performed for treatment of a proximal  right M2/MCA middle division branch near occlusion with superimposed atherosclerotic plaque. A total of 3 passes performed with stent retriever followed by intracranial stenting. Intracranial stenting performed after bolus infusion of cangrelor followed by low-dose protocol drip. PLAN: Patient remained intubated and transferred to ICU given hemodynamic instability. Continue cangrelor drip until transition to aspirin and Brilinta. Family updated by telephone immediately after the end of the procedure. Electronically Signed   By: Pedro Earls M.D.   On: 04/27/2020 10:36   CT ANGIO HEAD CODE STROKE  Result Date: 04/23/2020 CLINICAL DATA:  Slurred speech and left-sided weakness. EXAM: CT ANGIOGRAPHY HEAD AND NECK CT PERFUSION BRAIN TECHNIQUE: Multidetector CT imaging of the head and neck was performed using the standard protocol during bolus administration of intravenous contrast. Multiplanar CT image reconstructions and MIPs were obtained to evaluate the vascular anatomy. Carotid stenosis measurements (when applicable) are obtained utilizing NASCET criteria, using the distal internal carotid diameter as the denominator. Multiphase CT imaging of the brain was performed following IV bolus contrast injection. Subsequent parametric perfusion maps were calculated using RAPID software. CONTRAST:  6m OMNIPAQUE IOHEXOL 350 MG/ML SOLN COMPARISON:  None. FINDINGS: CT HEAD FINDINGS Brain: There is no evidence of an acute infarct, intracranial hemorrhage, mass, midline shift, or extra-axial fluid collection. Mild cerebral atrophy is within normal limits for age. Patchy hypodensities in the cerebral white matter bilaterally are nonspecific but compatible with mild chronic small vessel ischemic disease. There is a chronic lacunar infarct at the inferior aspect of the right caudate head. Vascular: Calcified atherosclerosis at the skull base. No hyperdense vessel. Skull: No fracture or suspicious osseous lesion.  Sinuses/Orbits: Mild scattered mucosal thickening in the paranasal sinuses. Clear mastoid air cells. Bilateral cataract extraction. Other: None. ASPECTS (ATouchetStroke Program Early CT Score) - Ganglionic level infarction (caudate, lentiform nuclei, internal capsule, insula, M1-M3 cortex): 7 - Supraganglionic infarction (M4-M6 cortex): 3 Total score (0-10 with 10 being normal): 10 Review of the MIP images confirms the above findings CTA NECK FINDINGS Aortic arch: Normal variant aortic arch branching pattern with common origin of the brachiocephalic and left common carotid arteries. Mild calcified plaque in the aortic arch without evidence of a significant arch vessel origin stenosis. Right carotid system: Patent with mild scattered predominantly calcified plaque throughout the common carotid artery and at the carotid bifurcation. No evidence of a significant stenosis or dissection.  Left carotid system: Patent with mild plaque in the mid common carotid artery not resulting in significant stenosis. More extensive, heavily calcified plaque at the carotid bifurcation results in less than 50% proximal ICA stenosis. No evidence of dissection. Vertebral arteries: There is either severe stenosis or short segment occlusion of the proximal left vertebral artery at its origin with the vessel being patent throughout the remainder of the neck. There is a diffusely thready appearance of the right vertebral artery V1 and V2 segments with the right V3 segment being occluded. Skeleton: Advanced cervical disc and facet degeneration. Other neck: No evidence of cervical lymphadenopathy or mass. Upper chest: Partially visualized small right pleural effusion. No apical lung consolidation. Review of the MIP images confirms the above findings CTA HEAD FINDINGS Anterior circulation: The internal carotid arteries are patent from skull base to carotid termini with calcified plaque resulting in mild cavernous and proximal supraclinoid  stenosis bilaterally. The right M1 segment is patent with a mild stenosis proximally. There is a decreased number of distal right MCA branch vessels compared to the left, however a discrete proximal branch occlusion is not identified. The left MCA is patent with branch vessel irregularity as well as a mild-to-moderate proximal M2 stenosis. Both ACAs are patent without evidence of a significant proximal stenosis. No aneurysm is identified. Posterior circulation: The intracranial left vertebral artery is patent but diffusely irregular and narrowed with multiple moderate to severe stenoses. The right V4 segment is occluded proximally with likely retrograde filling distally supplying the right PICA. 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 Patent SCAs are seen bilaterally. There is a fetal origin of the PCAs without evidence of a significant proximal PCA stenosis. No aneurysm is identified. Venous sinuses: Patent. Anatomic variants: Fetal origin of the PCAs. Review of the MIP images confirms the above findings CT Brain Perfusion Findings: ASPECTS: 10 CBF (<30%) Volume: 0 mL Perfusion (Tmax>6.0s) volume: 55 mL Mismatch Volume: 55 mL Infarction Location: No core infarct evident by CTP. Ischemic penumbra in the right MCA territory, greatest in the frontal lobe at and superior to the operculum though with parietal and temporal lobe involvement as well. IMPRESSION: CT HEAD: 1. No evidence of acute intracranial abnormality.  ASPECTS of 10. 2. Mild chronic small vessel ischemic disease. CTA HEAD AND NECK: 1. Decreased number of right MCA branch vessels without a discrete proximal occlusion identified. It is possible that this reflects a stump occlusion of a proximal to mid M2 branch which is not readily apparent by CTA. 2. Intracranial atherosclerosis with multiple anterior and posterior circulation stenoses  as detailed above. 3. 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 4. Severe stenosis or short segment occlusion of the left vertebral artery at its origin with patency of the vessel more distally. Heavily diseased left V4 segment. 5. Cervical carotid atherosclerosis without significant stenosis. 6.  Aortic Atherosclerosis (ICD10-I70.0). CT BRAIN PERFUSION: 55 mL of ischemic penumbra in the right MCA territory without evidence of a core infarct. These results were called by telephone at the time of interpretation on 04/27/2020 at 2:15 pm to Dr. Donnetta Simpers, who verbally acknowledged these results. Electronically Signed   By: Logan Bores M.D.   On: 04/07/2020 14:58   CT ANGIO NECK CODE STROKE  Result Date: 04/22/2020 CLINICAL DATA:  Slurred speech and left-sided weakness. EXAM: CT ANGIOGRAPHY HEAD AND NECK CT PERFUSION BRAIN TECHNIQUE: Multidetector CT imaging of the head and  neck was performed using the standard protocol during bolus administration of intravenous contrast. Multiplanar CT image reconstructions and MIPs were obtained to evaluate the vascular anatomy. Carotid stenosis measurements (when applicable) are obtained utilizing NASCET criteria, using the distal internal carotid diameter as the denominator. Multiphase CT imaging of the brain was performed following IV bolus contrast injection. Subsequent parametric perfusion maps were calculated using RAPID software. CONTRAST:  59m OMNIPAQUE IOHEXOL 350 MG/ML SOLN COMPARISON:  None. FINDINGS: CT HEAD FINDINGS Brain: There is no evidence of an acute infarct, intracranial hemorrhage, mass, midline shift, or extra-axial fluid collection. Mild cerebral atrophy is within normal limits for age. Patchy hypodensities in the cerebral white matter bilaterally are nonspecific but compatible with mild chronic small vessel ischemic disease. There is a chronic lacunar infarct at the inferior aspect of the right caudate head. Vascular: Calcified atherosclerosis  at the skull base. No hyperdense vessel. Skull: No fracture or suspicious osseous lesion. Sinuses/Orbits: Mild scattered mucosal thickening in the paranasal sinuses. Clear mastoid air cells. Bilateral cataract extraction. Other: None. ASPECTS ([place] Program Early CT Score) - Ganglionic level infarction (caudate, lentiform nuclei, internal capsule, insula, M1-M3 cortex): 7 - Supraganglionic infarction (M4-M6 cortex): 3 Total score (0-10 with 10 being normal): 10 Review of the MIP images confirms the above findings CTA NECK FINDINGS Aortic arch: Normal variant aortic arch branching pattern with common origin of the brachiocephalic and left common carotid arteries. Mild calcified plaque in the aortic arch without evidence of a significant arch vessel origin stenosis. Right carotid system: Patent with mild scattered predominantly calcified plaque throughout the common carotid artery and at the carotid bifurcation. No evidence of a significant stenosis or dissection. Left carotid system: Patent with mild plaque in the mid common carotid artery not resulting in significant stenosis. More extensive, heavily calcified plaque at the carotid bifurcation results in less than 50% proximal ICA stenosis. No evidence of dissection. Vertebral arteries: There is either severe stenosis or short segment occlusion of the proximal left vertebral artery at its origin with the vessel being patent throughout the remainder of the neck. There is a diffusely thready appearance of the right vertebral artery V1 and V2 segments with the right V3 segment being occluded. Skeleton: Advanced cervical disc and facet degeneration. Other neck: No evidence of cervical lymphadenopathy or mass. Upper chest: Partially visualized small right pleural effusion. No apical lung consolidation. Review of the MIP images confirms the above findings CTA HEAD FINDINGS Anterior circulation: The internal carotid arteries are patent from skull base to carotid  termini with calcified plaque resulting in mild cavernous and proximal supraclinoid stenosis bilaterally. The right M1 segment is patent with a mild stenosis proximally. There is a decreased number of distal right MCA branch vessels compared to the left, however a discrete proximal branch occlusion is not identified. The left MCA is patent with branch vessel irregularity as well as a mild-to-moderate proximal M2 stenosis. Both ACAs are patent without evidence of a significant proximal stenosis. No aneurysm is identified. Posterior circulation: The intracranial left vertebral artery is patent but diffusely irregular and narrowed with multiple moderate to severe stenoses. The right V4 segment is occluded proximally with likely retrograde filling distally supplying the right PICA. 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 Patent SCAs are seen bilaterally. There is a fetal origin of the PCAs without evidence of a significant proximal PCA stenosis. No aneurysm is identified. Venous sinuses: Patent.  Anatomic variants: Fetal origin of the PCAs. Review of the MIP images confirms the above findings CT Brain Perfusion Findings: ASPECTS: 10 CBF (<30%) Volume: 0 mL Perfusion (Tmax>6.0s) volume: 55 mL Mismatch Volume: 55 mL Infarction Location: No core infarct evident by CTP. Ischemic penumbra in the right MCA territory, greatest in the frontal lobe at and superior to the operculum though with parietal and temporal lobe involvement as well. IMPRESSION: CT HEAD: 1. No evidence of acute intracranial abnormality.  ASPECTS of 10. 2. Mild chronic small vessel ischemic disease. CTA HEAD AND NECK: 1. Decreased number of right MCA branch vessels without a discrete proximal occlusion identified. It is possible that this reflects a stump occlusion of a proximal to mid M2 branch which is not readily apparent by CTA. 2.  Intracranial atherosclerosis with multiple anterior and posterior circulation stenoses as detailed above. 3. 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 4. Severe stenosis or short segment occlusion of the left vertebral artery at its origin with patency of the vessel more distally. Heavily diseased left V4 segment. 5. Cervical carotid atherosclerosis without significant stenosis. 6.  Aortic Atherosclerosis (ICD10-I70.0). CT BRAIN PERFUSION: 55 mL of ischemic penumbra in the right MCA territory without evidence of a core infarct. These results were called by telephone at the time of interpretation on 04/29/2020 at 2:15 pm to Dr. Donnetta Simpers, who verbally acknowledged these results. Electronically Signed   By: Logan Bores M.D.   On: 04/09/2020 14:58    PHYSICAL EXAM  Temp:  [98.24 F (36.8 C)-102.74 F (39.3 C)] 99.14 F (37.3 C) (02/23 1000) Pulse Rate:  [97-122] 108 (02/23 1000) Resp:  [7-31] 24 (02/23 1000) BP: (86-125)/(40-59) 125/53 (02/23 0732) SpO2:  [93 %-100 %] 98 % (02/23 1000) Arterial Line BP: (92-157)/(43-61) 140/54 (02/23 1000) FiO2 (%):  [40 %] 40 % (02/23 0732) Weight:  [568 kg-144 kg] 144 kg (02/22 1810)  General - Well nourished, well developed, intubated on sedation.  Ophthalmologic - fundi not visualized due to noncooperation.  Cardiovascular - irregularly irregular heart rate and rhythm.  Neuro - On exam, intubated on precedex, stuperous, eyes closed, doesn't open eyes on noxious stimuli . Not  able to follow simple commands.  With forced eye opening, eyes in mid position, not blinking to visual threat, doll's eyes sluggish, not tracking. Corneal reflex weakly present, gag and cough present. Breathing barely over the vent.  Facial symmetry not able to test due to ET tube.  Tongue protrusion not cooperative.Can only see mild flicker in rt arm with noxious stimuli.  Not able to raise against gravity. Doesn't withdraws on noxious stimuli. DTR diminished and right  positive babinski. Sensation, coordination and gait not tested.  ASSESSMENT/PLAN Mr. Torrie Lafavor is a 80 y.o. male with history of PMHx of DM II, HTN, AF s/p cardioversion in 11/2019 on Xarelto, and HLD transfer from [facility] for significant left arm weakness, slurred speech and left facial droop. Time onset 1200pm. NIHSS = 4. CT no acute finding. CTA head and neck left ICA bulb and b/l ICA siphon severe athero with stenosis. Possible right M2 branch occlusion. CTP right MCA penumbra. Last dose Xarelto last night. Pt not tPA candidate given on Xarelto. He was transferred to [facility] for EVT.  ECHO showed Overall LVEF is probably normal to mildly decreased. CXR done showed Suspect pneumonia right perihilar region.CT head on 2/22 showed Scattered cortical and subcortical areas of hypoattenuation within the right MCA distribution, compatible with evolving infarcts. No progressive mass effect. Subtle cortical  versus sulcal hyperdensity along the high right posterior frontal convexity may represent petechial hemorrhage versus trace subarachnoid hemorrhage.   Blood c/s and urine c/s positive for Staph Aureus. Nafcillin started on 04/29/20. Nephro consulted, had HD on 2/22 and planned for 2/23.  Stroke:  right MCA scattered infarcts and two left ACA punctate infarcts due to left M2 occlusion s/p stenting, more likely large vessel stenosis given plaque occluding left M2. However, also possible due to AF despite on Xarelto, or related to endocarditis  CT no acute finding.   CTA head and neck left ICA bulb and b/l ICA siphon severe athero with stenosis. Questionable right M2 branch occlusion.   CTP right MCA penumbra.  IR - right M2 branch occlusion with plaque - s/p stenting  MRI  Scattered acute cortical infarcts in the right MCA territory and right occipital lobe. Two tiny cortical infarcts in the left cerebral hemisphere.  MRA  Present flow in the stented right M2 branch.  CT head 2/22 -Scattered cortical and  subcortical areas of hypoattenuation within the right MCA distribution, compatible with evolving infarcts.   2D Echo- Overall LVEF is probably normal to mildly decreased.   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 Will resume AC once stable  Ongoing aggressive stroke risk factor management  Therapy recommendations:  pending  Disposition:  pending  Respiratory failure  Intubated for procedure  Not able to extubate after procedure  CCM on board  Endocarditis  TEE showed Tricuspid valve endocarditis. 1.8 x 0.9 cm lesions likely on anterior tricuspid valve leaflet, likely adherent to atrial aspect of valve.   On nafcillin for CNS coverage too  Encephalopathy   EEG cortical dysfunction in right hemisphere due to underlying structural abnormality as well as moderate to severe diffuse encephalopathy  ? Bacterial meningitis - not stable for LP, could be partial treated so CSF may not have good interpretation - continue nafcillin for CNS coverage.  Cardiac arrest  CHF  Cardiac arrest for 20-30s and ROSC - no CPR needed  Concerning for CHF   CXR showed no Adian edema  TTE Overall LVEF is probably normal to mildly decreased. Need to repeat once stale.  Tele monitoring  Chronic AF   s/p cardioversion in 11/2019   on Xarelto  Rate controlled  Need to resume AC once stable  Intracranial stenosis  CTA head and neck left ICA bulb and b/l ICA siphon severe athero with stenosis.  Avoid low BP  Long term BP goal 130-150  Diabetes  HgbA1c 9.8 goal < 7.0  Uncontrolled  CBG monitoring  SSI  Hypoglycemia on 2/20- BG-45  On tube feeding now  DM education and close PCP follow up  Hypertension . Fluctuate according to sedation status . BP fluctuate  . Off cleviprex and  neo  Long term BP goal 130-150 given intracranial stenosis  Hyperlipidemia  Home meds:  lipitor 80   LDL < 10, goal < 70  on lipitor 80  Continue statin at discharge  AKI ? Vs. CKD ?  Cre 3.00->3.35->3.71>3.93>4.38>4.46  Hyperkalemia - 7.2->5.5->5.0>5.4>6.1>Lokelma>5.0  CCM on board  Nephro on Board  On HD now  Could be related to iodine contrast  Fluid Overload  Likely due to worsening renal function  Started on Lasix on 2/21  Lasix Stopped per  Nephrology  Pt had HD yesterday and again today.   Fever UTI Pneumonia Leucocytosis Septicemia  100.4>101.8>102.38>99.14  Leukocytosis - WBC 16.1->12.2>15.2>16.4  CXR- suspected pneumonia right perihilar region.  UA WBC > 50  UA C/s - Staph aureus  Blood C/s- Staph aureus  Pt on Nafcillin (2/23)  Monitor Temp  CCM Following  Buspar started on 2/22  Anemia   Hb 8.6->8.3  Received 2 units PRBC on 2/19  Monitor CBC  CCM following.  Transfuse per protocol.  Other Stroke Risk Factors  Advanced age  Obesity, Body mass index is 39.68 kg/m.   Other Active Problems    Hospital day # 4   ATTENDING NOTE: I reviewed above note and agree with the assessment and plan. Pt was seen and examined.   RNs and Dr. Lynetta Mare are at the bedside. Pt still intubated, sedated, unresponsive. Not open eyes or follow commands. Neuro exam unchanged from yesterday. Pt continues to have fever now on arctic sun, AKI on HD, TEE found to have large TV endocarditis. Not stable enough for LP, now on nafcillin with CNS penetration. Continue ASA and brilinta.   For detailed assessment and plan, please refer to above as I have made changes wherever appropriate.   Rosalin Hawking, MD PhD Stroke Neurology 04/29/2020 11:41 PM  This patient is critically ill due to right MCA infarct, afib, AMS, endocarditis, worsening renal failure, cardiac arrest and at significant risk of neurological worsening, death form recurrent  stroke, heart failure, seizure, meningitis. This patient's care requires constant monitoring of vital signs, hemodynamics, respiratory and cardiac monitoring, review of multiple databases, neurological assessment, discussion with family, other specialists and medical decision making of high complexity. I spent 35 minutes of neurocritical care time in the care of this patient.   To contact Stroke Continuity provider, please refer to [email or web address] After hours, contact General Neurology

## 2020-04-30 ENCOUNTER — Inpatient Hospital Stay (HOSPITAL_COMMUNITY): Payer: PPO

## 2020-04-30 DIAGNOSIS — I63019 Cerebral infarction due to thrombosis of unspecified vertebral artery: Secondary | ICD-10-CM | POA: Diagnosis not present

## 2020-04-30 DIAGNOSIS — I63411 Cerebral infarction due to embolism of right middle cerebral artery: Secondary | ICD-10-CM | POA: Diagnosis not present

## 2020-04-30 DIAGNOSIS — R7881 Bacteremia: Secondary | ICD-10-CM | POA: Diagnosis not present

## 2020-04-30 DIAGNOSIS — B9561 Methicillin susceptible Staphylococcus aureus infection as the cause of diseases classified elsewhere: Secondary | ICD-10-CM

## 2020-04-30 DIAGNOSIS — I079 Rheumatic tricuspid valve disease, unspecified: Secondary | ICD-10-CM | POA: Diagnosis not present

## 2020-04-30 DIAGNOSIS — N179 Acute kidney failure, unspecified: Secondary | ICD-10-CM | POA: Diagnosis not present

## 2020-04-30 DIAGNOSIS — I5021 Acute systolic (congestive) heart failure: Secondary | ICD-10-CM

## 2020-04-30 DIAGNOSIS — Z992 Dependence on renal dialysis: Secondary | ICD-10-CM | POA: Diagnosis not present

## 2020-04-30 DIAGNOSIS — I330 Acute and subacute infective endocarditis: Secondary | ICD-10-CM | POA: Diagnosis not present

## 2020-04-30 DIAGNOSIS — I4819 Other persistent atrial fibrillation: Secondary | ICD-10-CM

## 2020-04-30 LAB — CULTURE, BLOOD (ROUTINE X 2)
Special Requests: ADEQUATE
Special Requests: ADEQUATE

## 2020-04-30 LAB — RENAL FUNCTION PANEL
Albumin: 1.7 g/dL — ABNORMAL LOW (ref 3.5–5.0)
Anion gap: 15 (ref 5–15)
BUN: 102 mg/dL — ABNORMAL HIGH (ref 8–23)
CO2: 20 mmol/L — ABNORMAL LOW (ref 22–32)
Calcium: 6.9 mg/dL — ABNORMAL LOW (ref 8.9–10.3)
Chloride: 104 mmol/L (ref 98–111)
Creatinine, Ser: 3.83 mg/dL — ABNORMAL HIGH (ref 0.61–1.24)
GFR, Estimated: 15 mL/min — ABNORMAL LOW (ref >60)
Glucose, Bld: 112 mg/dL — ABNORMAL HIGH (ref 70–99)
Phosphorus: 7.8 mg/dL — ABNORMAL HIGH (ref 2.5–4.6)
Potassium: 4.9 mmol/L (ref 3.5–5.1)
Sodium: 139 mmol/L (ref 135–145)

## 2020-04-30 LAB — GLUCOSE, CAPILLARY
Glucose-Capillary: 102 mg/dL — ABNORMAL HIGH (ref 70–99)
Glucose-Capillary: 108 mg/dL — ABNORMAL HIGH (ref 70–99)
Glucose-Capillary: 123 mg/dL — ABNORMAL HIGH (ref 70–99)
Glucose-Capillary: 128 mg/dL — ABNORMAL HIGH (ref 70–99)
Glucose-Capillary: 134 mg/dL — ABNORMAL HIGH (ref 70–99)
Glucose-Capillary: 151 mg/dL — ABNORMAL HIGH (ref 70–99)
Glucose-Capillary: 65 mg/dL — ABNORMAL LOW (ref 70–99)
Glucose-Capillary: 97 mg/dL (ref 70–99)

## 2020-04-30 LAB — URINE CULTURE: Culture: >=100000 — AB

## 2020-04-30 LAB — BASIC METABOLIC PANEL
Anion gap: 16 — ABNORMAL HIGH (ref 5–15)
BUN: 103 mg/dL — ABNORMAL HIGH (ref 8–23)
CO2: 22 mmol/L (ref 22–32)
Calcium: 7.3 mg/dL — ABNORMAL LOW (ref 8.9–10.3)
Chloride: 103 mmol/L (ref 98–111)
Creatinine, Ser: 3.99 mg/dL — ABNORMAL HIGH (ref 0.61–1.24)
GFR, Estimated: 14 mL/min — ABNORMAL LOW (ref >60)
Glucose, Bld: 104 mg/dL — ABNORMAL HIGH (ref 70–99)
Potassium: 4.5 mmol/L (ref 3.5–5.1)
Sodium: 141 mmol/L (ref 135–145)

## 2020-04-30 LAB — IRON AND TIBC
Iron: 14 ug/dL — ABNORMAL LOW (ref 45–182)
Saturation Ratios: 14 % — ABNORMAL LOW (ref 17.9–39.5)
TIBC: 99 ug/dL — ABNORMAL LOW (ref 250–450)
UIBC: 85 ug/dL

## 2020-04-30 LAB — CBC
HCT: 28.3 % — ABNORMAL LOW (ref 39.0–52.0)
Hemoglobin: 9 g/dL — ABNORMAL LOW (ref 13.0–17.0)
MCH: 27.7 pg (ref 26.0–34.0)
MCHC: 31.8 g/dL (ref 30.0–36.0)
MCV: 87.1 fL (ref 80.0–100.0)
Platelets: 142 10*3/uL — ABNORMAL LOW (ref 150–400)
RBC: 3.25 MIL/uL — ABNORMAL LOW (ref 4.22–5.81)
RDW: 15.8 % — ABNORMAL HIGH (ref 11.5–15.5)
WBC: 14.7 10*3/uL — ABNORMAL HIGH (ref 4.0–10.5)
nRBC: 0.3 % — ABNORMAL HIGH (ref 0.0–0.2)

## 2020-04-30 LAB — FERRITIN: Ferritin: 867 ng/mL — ABNORMAL HIGH (ref 24–336)

## 2020-04-30 IMAGING — DX DG CHEST 1V PORT
1 series · 1 of 1 positions shown · non-contrast
Comparison: Portable exam [R4] hours compared to [DATE]

CLINICAL DATA: Central line placement

EXAM:
PORTABLE CHEST 1 VIEW

[chest]
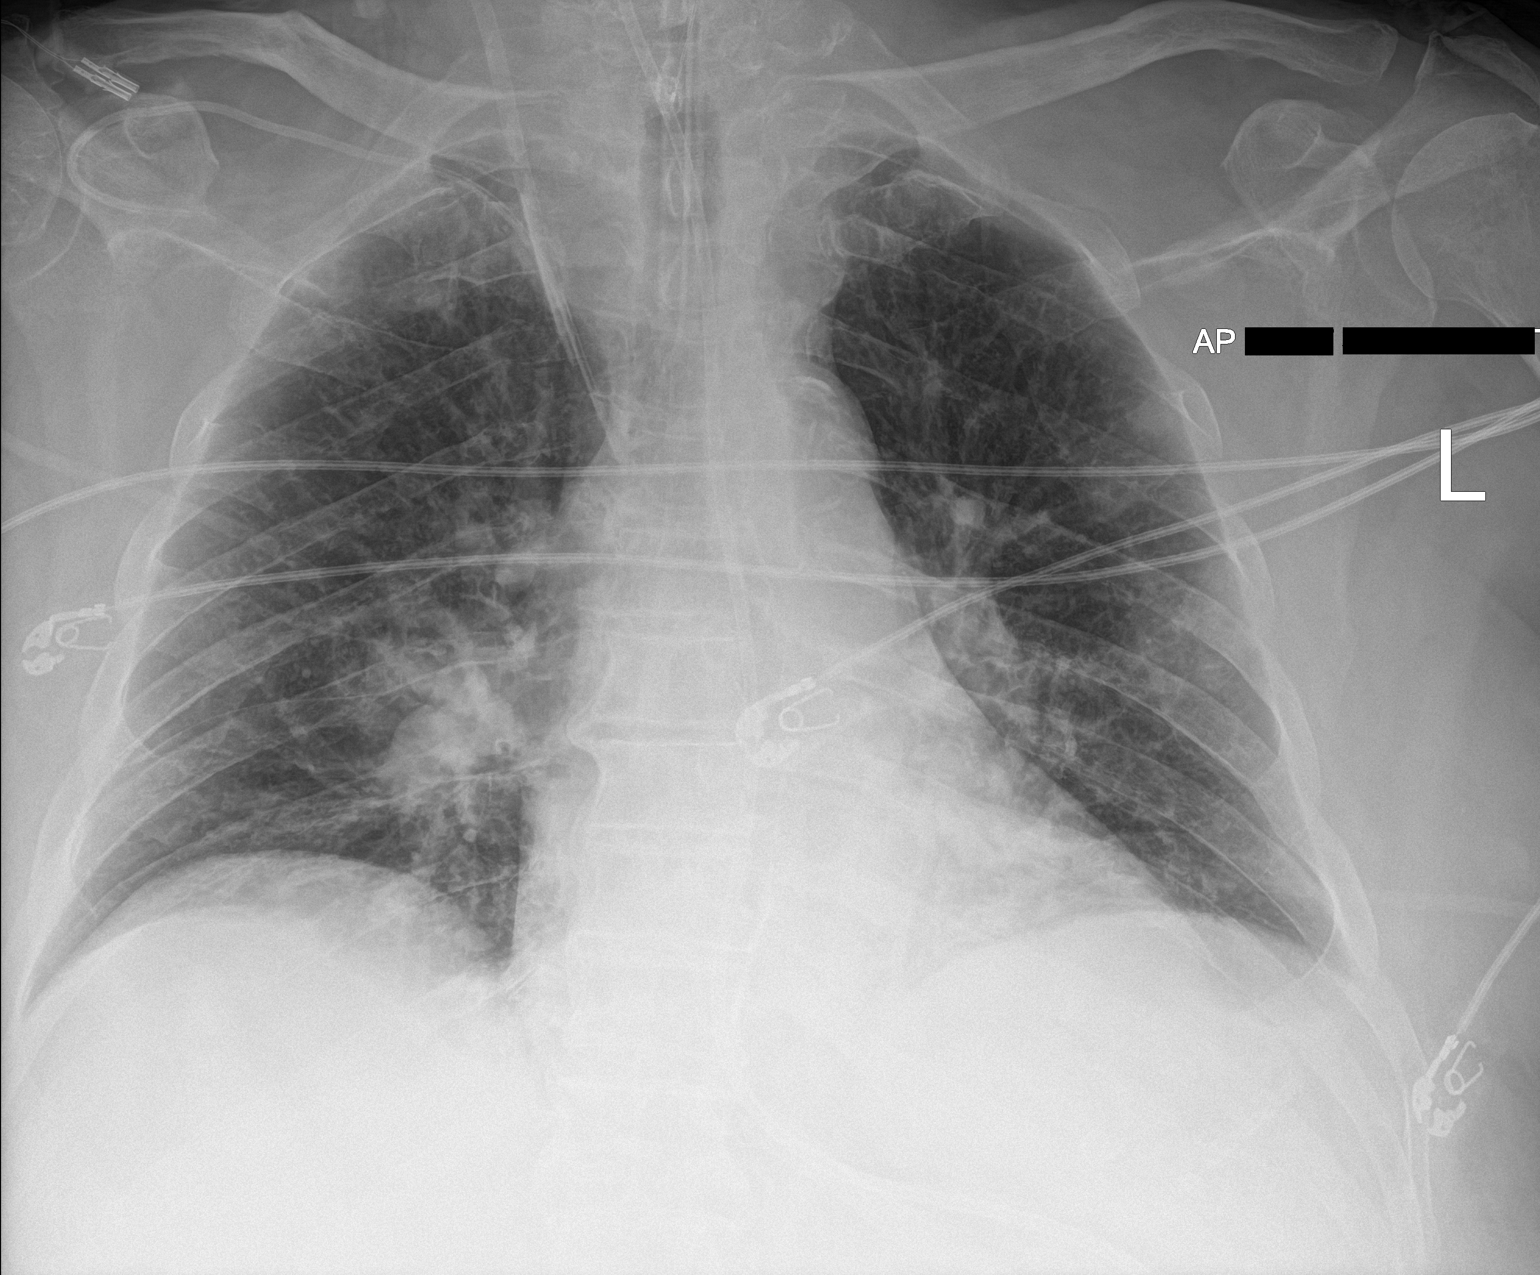

[1 of 1 positions shown; findings below may reference images not displayed]

FINDINGS: RIGHT jugular central venous catheter with tip projecting over SVC.

Feeding tube extends into stomach.

Tip of endotracheal tube projects 6.6 cm above carina.

Normal heart size mediastinal contours.

Atherosclerotic calcification aorta.

Bibasilar atelectasis versus infiltrate.

Improved LEFT mid lung infiltrates since previous exam.

No pneumothorax.
IMPRESSION: Bibasilar atelectasis with improved LEFT mid lung infiltrate.

## 2020-04-30 MED ORDER — ALBUMIN HUMAN 5 % IV SOLN
12.5000 g | Freq: Once | INTRAVENOUS | Status: AC
  Administered 2020-04-30: 12.5 g via INTRAVENOUS
  Filled 2020-04-30: qty 250

## 2020-04-30 MED ORDER — RENA-VITE PO TABS
1.0000 | ORAL_TABLET | Freq: Every day | ORAL | Status: DC
  Administered 2020-04-30 – 2020-05-01 (×2): 1
  Filled 2020-04-30 (×2): qty 1

## 2020-04-30 MED ORDER — HEPARIN SODIUM (PORCINE) 1000 UNIT/ML DIALYSIS
1000.0000 [IU] | INTRAMUSCULAR | Status: DC | PRN
  Filled 2020-04-30: qty 6

## 2020-04-30 MED ORDER — PRISMASOL BGK 4/2.5 32-4-2.5 MEQ/L EC SOLN
Status: DC
  Filled 2020-04-30 (×14): qty 5000

## 2020-04-30 MED ORDER — NUTRISOURCE FIBER PO PACK
1.0000 | PACK | Freq: Two times a day (BID) | ORAL | Status: DC
  Administered 2020-04-30 – 2020-05-01 (×3): 1
  Filled 2020-04-30 (×5): qty 1

## 2020-04-30 MED ORDER — PRISMASOL BGK 4/2.5 32-4-2.5 MEQ/L REPLACEMENT SOLN
Status: DC
  Filled 2020-04-30 (×3): qty 5000

## 2020-04-30 MED ORDER — PRISMASOL BGK 4/2.5 32-4-2.5 MEQ/L REPLACEMENT SOLN
Status: DC
  Filled 2020-04-30 (×4): qty 5000

## 2020-04-30 NOTE — Progress Notes (Signed)
[facility]    Date of Admission:  04/17/2020   Total days of antibiotics 3/day 2 nafcillin          ID: Robert Daniel is a 80 y.o. 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 acetaminophen  650 mg Oral Q4H   Or  . acetaminophen (TYLENOL) oral liquid 160 mg/5 mL  650 mg Per Tube Q4H   Or  . acetaminophen  650 mg Rectal Q4H  . aspirin  81 mg Per Tube Daily  . atorvastatin  80 mg Per Tube Daily  . chlorhexidine gluconate (MEDLINE KIT)  15 mL Mouth Rinse BID  . Chlorhexidine Gluconate Cloth  6 each Topical Q0600  . docusate  100 mg Per Tube BID  . feeding supplement (PROSource TF)  45 mL Per Tube BID  . fiber  1 packet Per Tube BID  . heparin injection (subcutaneous)  5,000 Units Subcutaneous Q8H  . insulin aspart  0-15 Units Subcutaneous Q4H  . mouth rinse  15 mL Mouth Rinse 10 times per day  . multivitamin  1 tablet Per Tube QHS  . pantoprazole sodium  40 mg Per Tube Q2000  . polyethylene glycol  17 g Per Tube Daily  . ticagrelor  90 mg Per Tube BID    Objective: Vital signs in last 24 hours: Temp:  [98.7 F (37.1 C)-99.86 F (37.7 C)] 98.7 F (37.1 C) (02/24 1200) Pulse Rate:  [106-129] 115 (02/24 1200) Resp:  [20-28] 26 (02/24 1200) BP: (105-144)/(45-81) 131/46 (02/24 1123) SpO2:  [77 %-100 %] 94 % (02/24 1200) Arterial Line BP: (86-134)/(36-50) 106/46 (02/24 1200) FiO2 (%):  [40 %] 40 % (02/24 1123) Weight:  [141.3 kg] 141.3 kg (02/24 1100) Physical Exam  Constitutional: unresponsive to verbal stimuli. He appears well-developed and well-nourished. No distress.  HENT: OETT in place Mouth/Throat: Oropharynx is clear  and moist. No oropharyngeal exudate.  Cardiovascular: tachycardic, regular rhythm and normal heart sounds. Exam reveals no gallop and no friction rub.  No murmur heard.  Pulmonary/Chest: Effort normal and breath sounds normal. No respiratory distress. He has no wheezes.  Abdominal: Soft. Bowel sounds are decreased. mild distension. There is no tenderness.  Ext: pitting edema Skin: Skin is warm and dry. No rash noted. No erythema.    Lab Results Recent Labs    04/29/20 0604 04/30/20 0042  WBC 16.4* 14.7*  HGB 8.3* 9.0*  HCT 27.1* 28.3*  NA 144 141  K 5.0 4.5  CL 108 103  CO2 19* 22  BUN 127* 103*  CREATININE 4.46* 3.99*    TEE 2/23: Tricuspid Valve: 1.8 x 0.9 cm mobile echodensity on the atrial aspect of  the tricuspid valve. Attachment point is most likely anterior tricuspid  valve leaflet. In setting of bacteremia, this most likely represents  infective endocarditis. The tricuspid  valve is abnormal. Tricuspid valve regurgitation is mild to moderate.  Microbiology: 2/24 blood cx pending 2/22 urine cx MSSA 2/22 blood cx MSSA x 2 sets Studies/Results: EEG  Result Date: 04/29/2020 Lora Havens, MD     04/29/2020  5:02 PM Patient Name: Robert Daniel MRN: [id number] Epilepsy Attending: Lora Havens Referring Physician/Provider: Dr  Lesleigh Noe Date: 04/29/2020 Duration: 32.42 mins Patient history: 80 year old male with worsening mental status.  EEG to evaluate for seizures. Level of alertness:  comatose AEDs during EEG study: None Technical aspects: This EEG study was done with scalp electrodes positioned according to the 10-20 International system of electrode placement. Electrical activity was acquired at a sampling rate of _0  and reviewed with a high frequency filter of _1  and a low frequency filter of _2 . EEG data were recorded continuously and digitally stored. Description: EEG showed continuous 5 to 6 Hz theta slowing in left hemisphere as well as 2-_3  low  amplitude delta slowing in right hemisphere.  Hyperventilation and photic stimulation were not performed.   ABNORMALITY -Continuous slow, generalized and lateralized right hemisphere IMPRESSION: This study is suggestive of cortical dysfunction in right hemisphere due to underlying structural abnormality as well as moderate to severe diffuse encephalopathy, nonspecific etiology. No seizures or epileptiform discharges were seen throughout the recording. Lora Havens   CT HEAD WO CONTRAST  Result Date: 04/28/2020 CLINICAL DATA:  Altered mental status. EXAM: CT HEAD WITHOUT CONTRAST TECHNIQUE: Contiguous axial images were obtained from the base of the skull through the vertex without intravenous contrast. COMPARISON:  MRI April 26, 2020.  CT exams April 25, 2020. FINDINGS: Brain: Scattered cortical and subcortical areas of hypoattenuation within the right MCA distribution, compatible with evolving infarcts. No progressive mass effect or midline shift. Subtle cortical versus sulcal hyperdensity along the high right posterior frontal convexity may represent petechial hemorrhage versus trace subarachnoid hemorrhage. No mass occupying hemorrhagic transformation. No mass lesion. Additional mild patchy white matter hypoattenuation, likely related to chronic microvascular ischemic disease. Vascular: Right M2 MCA stent.  Calcific atherosclerosis. Skull: No acute fracture. Sinuses/Orbits: Scattered paranasal sinus mucosal thickening. Other: No mastoid effusions. IMPRESSION: 1. Scattered cortical and subcortical areas of hypoattenuation within the right MCA distribution, compatible with evolving infarcts. No progressive mass effect. If there is concern for expansion of known infarcts, an MRI could better evaluate. 2. Subtle cortical versus sulcal hyperdensity along the high right posterior frontal convexity may represent petechial hemorrhage versus trace subarachnoid hemorrhage. No mass occupying hemorrhagic  transformation. 3. Paranasal sinus mucosal thickening. These results will be called to the ordering clinician or representative by the Radiologist Assistant, and communication documented in the PACS or [employer]. Electronically Signed   By: Margaretha Sheffield MD   On: 04/28/2020 21:24   MR ANGIO HEAD WO CONTRAST  Result Date: 04/29/2020 CLINICAL DATA:  80 year old male with altered mental status. Scattered infarcts in the right hemisphere, occasionally in the left hemisphere on 04/26/2020. History of right MCA stent. Questionable petechial or subarachnoid hemorrhage along the right superior convexity on head CT yesterday. EXAM: MRI HEAD WITHOUT CONTRAST MRA HEAD WITHOUT CONTRAST TECHNIQUE: Multiplanar, multiecho pulse sequences of the brain and surrounding structures were obtained without intravenous contrast. Angiographic images of the head were obtained using MRA technique without contrast. COMPARISON:  Brain MRI and MRA 04/26/2020.  Head CT yesterday. FINDINGS: MRI HEAD FINDINGS Brain: Increased size and confluence of multifocal restricted diffusion, now moderately affecting the right hemisphere in both right MCA and right PCA vascular territories. Confluent right corona radiata and frontal operculum progression (series 2, image 34). Similar increased conspicuity of small foci along the right internal capsule. More extensive cortical involvement elsewhere including at the left occipital pole, junction of the right temporal and occipital lobes. A few small scattered areas of cortical restricted diffusion in the left superior and posterior frontal lobe are  also mildly increased (e.g. Series 2, image 40). There is also a small round new focus of restricted diffusion in the central midbrain to the right (series 2, image 21). And furthermore increased conspicuity of a similar small focus of restriction in the left cerebellar vermis (image 12). No other new areas of involvement identified. Progressed  cytotoxic edema associated with the above. Mild new mass effect on the right lateral ventricle. No midline shift. Basilar cisterns remain patent. There is punctate but increased petechial or subarachnoid hemorrhage along the right central sulcus as suspected by CT (series 8, image 70 on SWI). Stable several chronic microhemorrhages in the opposite left frontal lobe. Susceptibility artifact related to posterior right MCA branch stent. No intraventricular hemorrhage or ventriculomegaly. Cervicomedullary junction and pituitary are within normal limits. Vascular: Major intracranial vascular flow voids remain stable. Skull and upper cervical spine: Stable, negative for age. Sinuses/Orbits: Stable and negative orbits. Fluid in the nasopharynx with right nasoenteric tube in place now. Stable fluid layering in the sphenoid sinuses. Mild paranasal sinus mucosal thickening elsewhere. New left greater than right mastoid fluid. Other: None. MRA HEAD FINDINGS Source images demonstrate stable antegrade flow signal compared to 04/26/20 throughout the anterior and posterior circulation. Diminutive vertebrobasilar system, somewhat dominant left vertebral artery. Less of the V4 segment is visible today. Distal vertebral and basilar artery irregularity but only mild associated stenosis. Basilar functionally terminates in the SCA is. Fetal type bilateral PCA origins and bilateral PCAs appears stable and within normal limits. Stable ICA siphons, mild tortuosity and irregularity without ICA stenosis. Ophthalmic and posterior communicating artery origins remain patent. Patent carotid termini. ACAs are stable and within normal limits, left A1 mildly dominant. Normal anterior communicating artery. Left MCA M1 segment, left MCA bifurcation and visible left MCA branches are stable and within normal limits. Right MCA M1 remains patent. Patent right MCA bifurcation and proximal M2 branches. Right MCA M2 vascular stent susceptibility and visible  right MCA branches appear stable. IMPRESSION: 1. Progressed size and extent of infarcts widely scattered in the right MCA and PCA territories since 04/26/2020. Associated petechial hemorrhage as suspected by CT. No malignant hemorrhagic transformation. Mild mass effect with no midline shift at this time. 2. Small new lacunar type infarcts in the left cerebellar vermis and right midbrain. Several small left MCA territory cortical infarcts again noted. 3. Stable intracranial MRA from 2 days ago. No large vessel occlusion. Stable MRA appearance of right MCA M2 stent. Electronically Signed   By: Genevie Ann M.D.   On: 04/29/2020 09:07   MR BRAIN WO CONTRAST  Result Date: 04/29/2020 CLINICAL DATA:  80 year old male with altered mental status. Scattered infarcts in the right hemisphere, occasionally in the left hemisphere on 04/26/2020. History of right MCA stent. Questionable petechial or subarachnoid hemorrhage along the right superior convexity on head CT yesterday. EXAM: MRI HEAD WITHOUT CONTRAST MRA HEAD WITHOUT CONTRAST TECHNIQUE: Multiplanar, multiecho pulse sequences of the brain and surrounding structures were obtained without intravenous contrast. Angiographic images of the head were obtained using MRA technique without contrast. COMPARISON:  Brain MRI and MRA 04/26/2020.  Head CT yesterday. FINDINGS: MRI HEAD FINDINGS Brain: Increased size and confluence of multifocal restricted diffusion, now moderately affecting the right hemisphere in both right MCA and right PCA vascular territories. Confluent right corona radiata and frontal operculum progression (series 2, image 34). Similar increased conspicuity of small foci along the right internal capsule. More extensive cortical involvement elsewhere including at the left occipital pole, junction of the right  temporal and occipital lobes. A few small scattered areas of cortical restricted diffusion in the left superior and posterior frontal lobe are also mildly  increased (e.g. Series 2, image 40). There is also a small round new focus of restricted diffusion in the central midbrain to the right (series 2, image 21). And furthermore increased conspicuity of a similar small focus of restriction in the left cerebellar vermis (image 12). No other new areas of involvement identified. Progressed cytotoxic edema associated with the above. Mild new mass effect on the right lateral ventricle. No midline shift. Basilar cisterns remain patent. There is punctate but increased petechial or subarachnoid hemorrhage along the right central sulcus as suspected by CT (series 8, image 70 on SWI). Stable several chronic microhemorrhages in the opposite left frontal lobe. Susceptibility artifact related to posterior right MCA branch stent. No intraventricular hemorrhage or ventriculomegaly. Cervicomedullary junction and pituitary are within normal limits. Vascular: Major intracranial vascular flow voids remain stable. Skull and upper cervical spine: Stable, negative for age. Sinuses/Orbits: Stable and negative orbits. Fluid in the nasopharynx with right nasoenteric tube in place now. Stable fluid layering in the sphenoid sinuses. Mild paranasal sinus mucosal thickening elsewhere. New left greater than right mastoid fluid. Other: None. MRA HEAD FINDINGS Source images demonstrate stable antegrade flow signal compared to 04/26/20 throughout the anterior and posterior circulation. Diminutive vertebrobasilar system, somewhat dominant left vertebral artery. Less of the V4 segment is visible today. Distal vertebral and basilar artery irregularity but only mild associated stenosis. Basilar functionally terminates in the SCA is. Fetal type bilateral PCA origins and bilateral PCAs appears stable and within normal limits. Stable ICA siphons, mild tortuosity and irregularity without ICA stenosis. Ophthalmic and posterior communicating artery origins remain patent. Patent carotid termini. ACAs are stable  and within normal limits, left A1 mildly dominant. Normal anterior communicating artery. Left MCA M1 segment, left MCA bifurcation and visible left MCA branches are stable and within normal limits. Right MCA M1 remains patent. Patent right MCA bifurcation and proximal M2 branches. Right MCA M2 vascular stent susceptibility and visible right MCA branches appear stable. IMPRESSION: 1. Progressed size and extent of infarcts widely scattered in the right MCA and PCA territories since 04/26/2020. Associated petechial hemorrhage as suspected by CT. No malignant hemorrhagic transformation. Mild mass effect with no midline shift at this time. 2. Small new lacunar type infarcts in the left cerebellar vermis and right midbrain. Several small left MCA territory cortical infarcts again noted. 3. Stable intracranial MRA from 2 days ago. No large vessel occlusion. Stable MRA appearance of right MCA M2 stent. Electronically Signed   By: Genevie Ann M.D.   On: 04/29/2020 09:07   US RENAL  Result Date: 04/29/2020 CLINICAL DATA:  Acute kidney injury EXAM: RENAL / URINARY TRACT ULTRASOUND COMPLETE COMPARISON:  None. FINDINGS: Right Kidney: Renal measurements: 14.4 x 6 x 5.9 cm = volume: 265 mL. Cortical echogenicity normal. No hydronephrosis. Possible exophytic cyst off the midpole but poor visibility. Left Kidney: Renal measurements: 13.6 x 7.3 x 6.4 cm = volume: 332.5 mL. Echogenicity within normal limits. No hydronephrosis. Cyst at the lower pole measuring 6.3 cm. Bladder: Not well seen and presumably empty Other: None. IMPRESSION: 1. Negative for hydronephrosis. 2. Simple appearing left renal cyst Electronically Signed   By: Donavan Foil M.D.   On: 04/29/2020 00:11   DG CHEST PORT 1 VIEW  Result Date: 04/29/2020 CLINICAL DATA:  Renal failure. EXAM: PORTABLE CHEST 1 VIEW COMPARISON:  April 28, 2020. FINDINGS: Stable  cardiomediastinal silhouette. Endotracheal and feeding tubes are unchanged in position. Right internal  jugular catheter is unchanged. No pneumothorax or pleural effusion is noted. New mild opacity is seen involving peripheral left midlung concerning for possible pneumonia. Mild right basilar subsegmental atelectasis is noted. Bony thorax is unremarkable. IMPRESSION: Stable support apparatus. New mild opacity is seen involving peripheral left midlung concerning for possible pneumonia. Electronically Signed   By: Marijo Conception M.D.   On: 04/29/2020 14:32   DG Abd Portable 1V  Result Date: 04/29/2020 CLINICAL DATA:  Nasogastric tube placement. EXAM: PORTABLE ABDOMEN - 1 VIEW COMPARISON:  April 26, 2020. FINDINGS: The bowel gas pattern is normal. Feeding tube tip is seen in expected position of distal stomach. No radio-opaque calculi or other significant radiographic abnormality are seen. IMPRESSION: Feeding tube tip seen in expected position of distal stomach. No evidence of bowel obstruction or ileus. Electronically Signed   By: Marijo Conception M.D.   On: 04/29/2020 14:29   ECHO TEE  Result Date: 04/29/2020    TRANSESOPHOGEAL ECHO REPORT   Patient Name:   Robert Daniel Date of Exam: 04/29/2020 Medical Rec #:  [id number]       Height:       75.0 in Accession #:    [id number]      Weight:       317.5 lb Date of Birth:  Dec 13, 1940       BSA:          2.671 m Patient Age:    41 years        BP:           125/50 mmHg Patient Gender: M               HR:           116 bpm. Exam Location:  Inpatient Procedure: Transesophageal Echo, Cardiac Doppler and Color Doppler Indications:     Stroke 434.91 / I63.9  History:         Patient has prior history of Echocardiogram examinations, most                  recent 04/27/2020. Arrythmias:Atrial Fibrillation,                  Signs/Symptoms:Shortness of Breath; Risk Factors:Hypertension,                  Diabetes and Dyslipidemia.  Sonographer:     Darlina Sicilian RDCS Referring Phys:  [id number] Lorenza Chick Diagnosing Phys: Cherlynn Kaiser MD PROCEDURE: After discussion of  the risks and benefits of a TEE, an informed consent was obtained from a family member. The patient was intubated. TEE procedure time was 17 minutes. The transesophogeal probe was passed without difficulty through the esophogus of the patient. Imaged were obtained with the patient in a supine position. Local oropharyngeal anesthetic was provided with Cetacaine. Sedation performed by different physician. The patient was monitored while under deep sedation. Anesthestetic sedation was provided intravenously by Anesthesiology: 36m of Propofol. Image quality was good. The patient's vital signs; including heart rate, blood pressure, and oxygen saturation; remained stable throughout the procedure. The patient developed no complications during the procedure. IMPRESSIONS  1. 1.8 x 0.9 cm mobile echodensity on the atrial aspect of the tricuspid valve. Attachment point is most likely anterior tricuspid valve leaflet. 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 The tricuspid valve is abnormal. Tricuspid valve regurgitation is mild to moderate.  2.  Left ventricular ejection fraction, by estimation, is 45 to 50%. The left ventricle has mildly decreased function. There is moderate left ventricular hypertrophy.  3. Right ventricular systolic function is mildly reduced. The right ventricular size is mildly enlarged.  4. Left atrial size was moderately dilated. No left atrial/left atrial appendage thrombus was detected. The LAA emptying velocity was 72 cm/s.  5. Right atrial size was mild to moderately dilated.  6. The mitral valve is degenerative. Mild mitral valve regurgitation.  7. The aortic valve is abnormal. There is mild calcification of the aortic valve. Aortic valve regurgitation is trivial. No aortic stenosis is present.  8. Aortic dilatation noted. There is borderline dilatation of the aortic root and of the ascending aorta, measuring 40 mm. There is Severe (Grade IV) atheroma plaque  involving the transverse and descending aorta.  9. Agitated saline contrast bubble study was negative, with no evidence of any interatrial shunt. FINDINGS  Left Ventricle: Left ventricular ejection fraction, by estimation, is 45 to 50%. The left ventricle has mildly decreased function. The left ventricular internal cavity size was normal in size. There is moderate left ventricular hypertrophy. Right Ventricle: The right ventricular size is mildly enlarged. Right vetricular wall thickness was not well visualized. Right ventricular systolic function is mildly reduced. Left Atrium: Left atrial size was moderately dilated. No left atrial/left atrial appendage thrombus was detected. The LAA emptying velocity was 72 cm/s. Right Atrium: Right atrial size was mild to moderately dilated. Pericardium: There is no evidence of pericardial effusion. Mitral Valve: The mitral valve is degenerative in appearance. Mild mitral valve regurgitation. Tricuspid Valve: 1.8 x 0.9 cm mobile echodensity on the atrial aspect of the tricuspid valve. Attachment point is most likely anterior tricuspid valve leaflet. 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 The tricuspid valve is abnormal. Tricuspid valve regurgitation is mild to moderate. Aortic Valve: The aortic valve is abnormal. There is mild calcification of the aortic valve. Aortic valve regurgitation is trivial. No aortic stenosis is present. Aortic valve mean gradient measures 4.0 mmHg. Aortic valve peak gradient measures 7.2 mmHg.  Aortic valve area, by VTI measures 3.02 cm. Pulmonic Valve: The pulmonic valve was grossly normal. Pulmonic valve regurgitation is trivial. Aorta: Aortic dilatation noted. There is borderline dilatation of the aortic root and of the ascending aorta, measuring 40 mm. There is severe (Grade IV) atheroma plaque involving the transverse and descending aorta. IAS/Shunts: No atrial level shunt detected by color flow Doppler.  Agitated saline contrast was given intravenously to evaluate for intracardiac shunting. Agitated saline contrast bubble study was negative, with no evidence of any interatrial shunt.  LEFT VENTRICLE PLAX 2D LVOT diam:     2.10 cm LV SV:         56 LV SV Index:   21 LVOT Area:     3.46 cm  AORTIC VALVE AV Area (Vmax):    2.84 cm AV Area (Vmean):   2.71 cm AV Area (VTI):     3.02 cm AV Vmax:           134.00 cm/s AV Vmean:          94.700 cm/s AV VTI:            0.186 m AV Peak Grad:      7.2 mmHg AV Mean Grad:      4.0 mmHg LVOT Vmax:         110.00 cm/s LVOT Vmean:        74.100  cm/s LVOT VTI:          0.162 m LVOT/AV VTI ratio: 0.87  AORTA Ao Root diam: 4.00 cm Ao Asc diam:  4.00 cm TRICUSPID VALVE TR Peak grad:   30.9 mmHg TR Vmax:        278.00 cm/s  SHUNTS Systemic VTI:  0.16 m Systemic Diam: 2.10 cm Cherlynn Kaiser MD Electronically signed by Cherlynn Kaiser MD Signature Date/Time: 04/29/2020/9:50:53 PM    Final      Assessment/Plan: mssa endocarditis with CNS septic emboli = continue with nafcillin, plan for 6 weeks.  His HD line and arterial line were placed when he was bacteremic. Will need to be changed.  ATN, AKI = undergoing CRRT  Leukocytosis = improving    [facility] for Infectious Diseases Cell: [phone] Pager: [phone]  04/30/2020, 2:08 PM

## 2020-04-30 NOTE — Procedures (Signed)
Central Venous Catheter Insertion Procedure Note  Brexten Calero  [id number]  02-20-1941  Date:04/30/20  Time:4:52 PM   Provider Performing:Kely Dohn   Procedure: Insertion of Non-tunneled Central Venous [phone]) with US guidance [id number])   Indication(s) Medication administration  Consent Risks of the procedure as well as the alternatives and risks of each were explained to the patient and/or caregiver.  Consent for the procedure was obtained and is signed in the bedside chart  Anesthesia Topical only with 1% lidocaine   Timeout Verified patient identification, verified procedure, site/side was marked, verified correct patient position, special equipment/implants available, medications/allergies/relevant history reviewed, required imaging and test results available.  Sterile Technique Maximal sterile technique including full sterile barrier drape, hand hygiene, sterile gown, sterile gloves, mask, hair covering, sterile ultrasound probe cover (if used).  Procedure Description Area of catheter insertion was cleaned with chlorhexidine and draped in sterile fashion.  With real-time ultrasound guidance a central venous catheter was placed into the right subclavian vein. Nonpulsatile blood flow and easy flushing noted in all ports.  The catheter was sutured in place and sterile dressing applied.  Complications/Tolerance None; patient tolerated the procedure well. Chest X-ray is ordered to verify placement for internal jugular or subclavian cannulation.   Chest x-ray is not ordered for femoral cannulation.  EBL Minimal  Specimen(s) None

## 2020-04-30 NOTE — Care Management (Signed)
Transitions of Care Team following this patient for discharge planning.  Currently, patient not medically stable; will continue to follow as patient progresses.    Reinaldo Raddle, RN, BSN  Trauma/Neuro ICU Case Manager [phone]

## 2020-04-30 NOTE — Progress Notes (Signed)
[facility] Progress Note  Name: Robert Daniel MRN: [id number] DOB: 01/25/1941  Chief Complaint:  weakness  Subjective:  Had UOP of 140 mL on 2/23.  had 0.9 kg UF with 2/23 with HD.  He's been on levo at 10 mcg/min.  Discussed with team.   Review of systems:  Unable to obtain 2/2 intubated   -------------- Background on consult:  Robert Daniel is a 80 y.o. male with a history of HTN, CKD, DM, and atrial fibrillation who presented with weakness and slurred speech, and left facial droop.  Found to have right MCA CVA and transferred to [facility].  He is s/p thrombectomy.  Course has been complicated by renal failure with hypotension, pause vs asystole per charting with spontaneous ROSC per charting.  Nephrology is consulted for hyperkalemia and volume overload - has been on lasix gtt augmented by metolazone.  Yesterday with 1.4 liters UOP over 2/21. Spoke to his sister, Robert Daniel via phone.  We discussed risks/benefits/indications for dialysis and she does consent to proceed with dialysis and dialysis catheter placement.  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  Critical care ok with HD.  Per nursing it's been harder to get him to follow commands today.    Intake/Output Summary (Last 24 hours) at 04/30/2020 0907 Last data filed at 04/30/2020 0600 Gross per 24 hour  Intake 3198.67 ml  Output 1411 ml  Net 1787.67 ml    Vitals:  Vitals:   04/30/20 0400 04/30/20 0500 04/30/20 0600 04/30/20 0734  BP:    (!) 106/45  Pulse: (!) 112 (!) 117 (!) 117 (!) 115  Resp: (!) 28 (!) 27 (!) 25 (!) 25  Temp: 99.68 F (37.6 C) 99.68 F (37.6 C) 99.86 F (37.7 C)   TempSrc:      SpO2: 98% 98% 99% 100%  Weight:      Height:         Physical Exam:  General: elderly male intubated  HEENT: NCAT Neck: trachea midline; increased neck circumference  Heart: tachy S1S2 no rub Lungs: coarse breath sounds on vent 40 fi02/10 PEEP Abdomen: soft/obese/nt with  sedation Extremities: 2+ edema diffusely Skin: no rash on extremities exposed Neuro: sedation currently running Access RIJ nontunneled catheter  Medications reviewed    Labs:  BMP Latest Ref Rng & Units 04/30/2020 04/29/2020 04/28/2020  Glucose 70 - 99 mg/dL 104(H) 73 -  BUN 8 - 23 mg/dL 103(H) 127(H) -  Creatinine 0.61 - 1.24 mg/dL 3.99(H) 4.46(H) -  BUN/Creat Ratio 10 - 24 - - -  Sodium 135 - 145 mmol/L 141 144 -  Potassium 3.5 - 5.1 mmol/L 4.5 5.0 5.8(H)  Chloride 98 - 111 mmol/L 103 108 -  CO2 22 - 32 mmol/L 22 19(L) -  Calcium 8.9 - 10.3 mg/dL 7.3(L) 7.3(L) -     Assessment/Plan:   # AKI - secondary to ischemic ATN and pre-renal insults - stroke, contrast, pause vs asystole reported.  Note presented with AKI from last baseline - note home meds include ACE inhibitor, ibuprofen, torsemide.  UA neg protein and > 50 RBC's noted. Up/cr ratio 1220 mg/g.  Renal US large kidneys no hydro.  Had HD on 2/23 with limited UF.   - Will transition to CRRT - he's septic and overloaded and hypotensive  # Hyperkalemia  - improved with RRT   # Metabolic acidosis  - setting of AKI - on RRT  # CVA - acute - s/p thrombectomy - per primary  team   # Staph aureus bacteremia - MSSA - nontunneled catheter just placed - abx per primary team  # septic shock  - stopped labetalol  - he's on levo - abx per primary    # Anemia acute illness - no ESA setting of acute CVA; check iron panel  - PRBC's for goal of 7 (though 8 preferred given new AKI)  # CKD stage 3 - previous baseline Cr 1.4 last fall - presented with AKI from that baseline  # UTI - staph aureus; abx per primary team   Claudia Desanctis, MD 04/30/2020  9:20 AM

## 2020-04-30 NOTE — Progress Notes (Signed)
NAME:  Robert Daniel, MRN:  [id number], DOB:  [birth year], LOS: 5 ADMISSION DATE:  04/30/2020, CONSULTATION DATE:  04/30/2020 REFERRING MD:  Erlinda Hong - neuro, CHIEF COMPLAINT:  R MCA CVA, respiratory failure  Brief History:  80 yo R MCA CVA, s/p stent in NIR, remains intubated  History of Present Illness:  80 yo M PMH Afib on xarelto, HTN, DM2, rpesented to [facility] 2/19 with weakness and slurred speech. Sx began 2/18, but resolved, until 2/19 approx noon when he had L arm weakness, slurred speech, L facial droop. Last took xarelto 2/18 evening. Found to have R MCA CVA Transferred to [facility], and taken urgently to NIR for mechanical thrombectomy of proximal R M2/MCA occlusion, with subsequent reocclusion x 3, followed by intracranial stent deployment with TICI3 recanalization. NIR course also c/b pt instability-- with hypotension,  pause vs asystole with spontaneous ROSC following a prop bolus. He received 2 PRBC in NIR for hypotension; as well as albumin and neo. Remains intubated, transferring to ICU. Received repeat paralytic prior to ICU transfer   Past Medical History:  HTN Afib HLD DM2  Significant Hospital Events:  2/19 [facility] ED as code stroke. R MCA CVA. Transferred to [facility] for NIR. Brief asystolic arrest with spontaneous ROSC vs pause in NIR. Remains intubated 2/19 Mechanical thrombectomy right M2 occlusion with TICI-3 flow.  Consults:  NIR PCCM  Procedures:  2/19 ETT> 2/19 Art line>  2/22 temporary right HD catheter.  Significant Diagnostic Tests:  2/19 CTA H> R MCA proximal occlusion   2/23 Progressed size and extent of infarcts widely scattered in the right MCA and PCA territories since 04/26/2020. Associated petechial hemorrhage as suspected by CT. No malignant hemorrhagic transformation. Mild mass effect with no midline shift at this time.  Small new lacunar type infarcts in the left cerebellar vermis and right midbrain. Several small left MCA territory cortical infarcts again  noted.  Stable intracranial MRA from 2 days ago. No large vessel occlusion. Stable MRA appearance of right MCA M2 stent.  2/23 TEE: normal LV, RV function, trace AI, mild MR, mild TR. Tricuspid valve endocarditis.  2/23 EEG: diffuse encephalopathy but without active seizures.  Micro Data:  COVID-19 neg 2/22 - Blood and urine cultures positive for MSSA  Antimicrobials:  2/22 ceftriaxone >> 2/23 nafcillin   Interim History / Subjective:   Still not tolerated fluid removal with IHD. Off sedation but not yet following commands.   Objective   Blood pressure (!) 106/45, pulse (!) 115, temperature 99.86 F (37.7 C), resp. rate (!) 25, height '6\' 3"'$  (1.905 m), weight (!) 144 kg, SpO2 100 %.    Vent Mode: PRVC FiO2 (%):  [40 %] 40 % Set Rate:  [22 bmp] 22 bmp Vt Set:  [630 mL] 630 mL PEEP:  [10 cmH20] 10 cmH20 Plateau Pressure:  [17 cmH20-20 cmH20] 19 cmH20   Intake/Output Summary (Last 24 hours) at 04/30/2020 [id number] Last data filed at 04/30/2020 0600 Gross per 24 hour  Intake 3198.67 ml  Output 1411 ml  Net 1787.67 ml   Filed Weights   04/28/20 1600 04/28/20 1700 04/28/20 1810  Weight: 113 kg (!) 144 kg (!) 144 kg    Examination: General: obese man intubated.  HENT:ETT secure, SBFT in place   Lungs: chest clear bilaterally.  Tachypneic with increased respiratory rate when transitioned to PSV Cardiovascular: irregular rhythm reg rate s1s2, JVP elevated. No murmur Abdomen: Obese soft round non-distended Extremities: BLE 2+ pitting edema. No obvious joint deformity. R fem site  without hematoma.  Neuro: minimal response to painful stimuli. PERL.  No longer following commands - Roving eye movements. GU: Foley in place, poor urine output   Patient is +10L since admission.   Resolved Hospital Problem list     Assessment & Plan:   Critically ill due to acute ischemic stroke S/P s/p mechanical thrombectomy-- subsequent reocclusion x 3, stent placement with TICI3, requiring  mechanical ventilation for airway protection  Critically ill due to right sided endocarditis due to S.aureus requiring TTM for fever control to prevent secondary neurological injury Critically ill due to Septic shock requiring titration NE AKI on CKD - now on HD Toxic metabolic and uremic encephalopathy.  Uremia also may not be helping mental status.  Anemia of acute illness.  DM2 with hypoglycemia likely due to prolonged effect of sulfonylurea in face of worsening renal function - now resolved.  Leukocytosis Hypertension  Plan:  - Continue full ventilatory support - daily SBT. No likely to tolerate PSV until after fluid removal  - Stop TTM today - ID to see regarding bacteremia/ TV endocarditis.  - Transition to CRRT as unable to tolerated IHD fluid removal  - Stopped all antihypertensive medications - EEG to rule seizures as contributor to ongoing depressed LOC, if negative, expect that mental status should improve over time with further dialysis and treatment of infection.   Daily Goals Checklist  Pain/Anxiety/Delirium protocol (if indicated): wean precedex to off , keep RASS 0  Neuro vitals: every 4 hours AED's: none VAP protocol (if indicated): bundle in place Respiratory support goals: Continue full ventilator at current levels, start to wean once fluid removed with HD Blood pressure target: NE to keep MAP> 65 DVT prophylaxis: SCD's, start [place] heparin Nutrition Status: continue enteral nutrition.  Fiber for tube feed diarrhea. GI prophylaxis: protonix Fluid status goals: Fluid removal with HD Urinary catheter: Assessment of intravascular volume Central lines: right HD  Glucose control: improved on tube feeds.  Mobility/therapy needs: bedrest  Antibiotic de-escalation: none Home medication reconciliation: on hold Daily labs: BMP, CBC Code Status: Full  Family Communication: Spoke to sister 2/23 Disposition: ICU  Goals of Care:  Last date of multidisciplinary goals of  care discussion: per primary  Family and staff present:  Summary of discussion:  Follow up goals of care discussion due:-- Code Status: Full   Labs   CBC: Recent Labs  Lab 04/16/2020 1417 04/16/2020 1609 04/26/20 0423 04/27/20 0444 04/28/20 0500 04/29/20 0604 04/30/20 0042  WBC 16.8*   < > 12.2* 12.2* 15.2* 16.4* 14.7*  NEUTROABS 15.3*  --   --   --   --   --   --   HGB 8.1*   < > 8.6* 8.6* 8.6* 8.3* 9.0*  HCT 25.0*   < > 27.5* 27.7* 28.0* 27.1* 28.3*  MCV 88.0   < > 89.6 89.6 91.2 89.4 87.1  PLT 251   < > 264 217 191 146* 142*   < > = values in this interval not displayed.    Basic Metabolic Panel: Recent Labs  Lab 04/26/20 1735 04/27/20 0444 04/27/20 1632 04/28/20 0500 04/28/20 1108 04/28/20 1521 04/28/20 2100 04/29/20 0604 04/30/20 0042  NA 139 140  --  141  --   --   --  144 141  K 5.0 5.4*  --  6.1* 6.2* 5.8*  --  5.0 4.5  CL 108 108  --  109  --   --   --  108 103  CO2 16* 16*  --  15*  --   --   --  19* 22  GLUCOSE 126* 126*  --  274*  --   --   --  73 104*  BUN 90* 101*  --  132*  --   --   --  127* 103*  CREATININE 3.71* 3.93*  --  4.38*  --   --   --  4.46* 3.99*  CALCIUM 8.1* 8.0*  --  7.8*  --   --   --  7.3* 7.3*  MG 2.1 2.1 2.3 2.3  --   --  2.2  --   --   PHOS 7.3* 8.2* 8.4* 9.3*  --   --   --   --   --    GFR: Estimated Creatinine Clearance: 22.6 mL/min (A) (by C-G formula based on SCr of 3.99 mg/dL (H)). Recent Labs  Lab 04/13/2020 1411 04/13/2020 1417 04/27/20 0444 04/28/20 0500 04/29/20 0604 04/30/20 0042  PROCALCITON 2.21  --   --   --   --   --   WBC  --    < > 12.2* 15.2* 16.4* 14.7*   < > = values in this interval not displayed.    Liver Function Tests: Recent Labs  Lab 04/16/2020 1417  AST 37  ALT 28  ALKPHOS 66  BILITOT 0.7  PROT 6.1*  ALBUMIN 2.4*   No results for input(s): LIPASE, AMYLASE in the last 168 hours. No results for input(s): AMMONIA in the last 168 hours.  ABG    Component Value Date/Time   PHART 7.291 (L)  04/23/2020 2043   PCO2ART 36.3 04/24/2020 2043   PO2ART 103 04/24/2020 2043   HCO3 17.9 (L) 04/27/2020 2043   TCO2 19 (L) 04/27/2020 2043   ACIDBASEDEF 8.0 (H) 05/03/2020 2043   O2SAT 98.0 04/10/2020 2043     Coagulation Profile: Recent Labs  Lab 04/21/2020 1417 04/26/20 0206  INR 2.2* 2.4*    Cardiac Enzymes: Recent Labs  Lab 04/28/20 1721  CKTOTAL 124    HbA1C: Hemoglobin A1C  Date/Time Value Ref Range Status  01/28/2020 10:58 AM 7.3 (A) 4.0 - 5.6 % Final  08/23/2016 12:00 AM 7.6  Final  04/05/2016 12:00 AM 7.0  Final   Hgb A1c MFr Bld  Date/Time Value Ref Range Status  04/26/2020 04:23 AM 9.8 (H) 4.8 - 5.6 % Final    Comment:    (NOTE) Pre diabetes:          5.7%-6.4%  Diabetes:              >6.4%  Glycemic control for   <7.0% adults with diabetes   07/16/2019 07:51 AM 8.3 (H) <5.7 % of total Hgb Final    Comment:    For someone without known diabetes, a hemoglobin A1c value of 6.5% or greater indicates that they may have  diabetes and this should be confirmed with a follow-up  test. . For someone with known diabetes, a value <7% indicates  that their diabetes is well controlled and a value  greater than or equal to 7% indicates suboptimal  control. A1c targets should be individualized based on  duration of diabetes, age, comorbid conditions, and  other considerations. . Currently, no consensus exists regarding use of hemoglobin A1c for diagnosis of diabetes for children. .     CBG: Recent Labs  Lab 04/29/20 2318 04/29/20 2358 04/30/20 0010 04/30/20 0321 04/30/20 0739  GLUCAP 67* 65* 108* 97 123*   BNP    Component  Value Date/Time   BNP 1,653.5 (H) 05/03/2020 1417   CRITICAL CARE Performed by: Kipp Brood   Total critical care time: 40 minutes  Critical care time was exclusive of separately billable procedures and treating other patients.  Critical care was necessary to treat or prevent imminent or life-threatening  deterioration.  Critical care was time spent personally by me on the following activities: development of treatment plan with patient and/or surrogate as well as nursing, discussions with consultants, evaluation of patient's response to treatment, examination of patient, obtaining history from patient or surrogate, ordering and performing treatments and interventions, ordering and review of laboratory studies, ordering and review of radiographic studies, pulse oximetry, re-evaluation of patient's condition and participation in multidisciplinary rounds.  Kipp Brood, MD [facility] ICU Physician [place]  Pager: [phone] Mobile: [phone] After hours: [phone].   04/30/2020, 9:27 AM

## 2020-04-30 NOTE — Progress Notes (Signed)
Hypoglycemic Event  CBG: 67  Treatment: D50 31m (12.5g)   Follow-up CBG: Time: 0010 CBG Result:108 (via art line)   Possible Reasons for Event: Edematous extremeties. Repeat CBG done via arterial line  Comments/MD notified:     Ellisa Devivo T Ednamae Schiano

## 2020-04-30 NOTE — Progress Notes (Addendum)
STROKE TEAM PROGRESS NOTE   SUBJECTIVE (INTERVAL HISTORY) Pt is evaluated at the bedside.  Pt is critically ill. On Neuro Exam, Pt is still intubated, doesn't follows commands however, Pt able to squeeze Rt hand weakly on request. Doesn't open eyes to noxious stimuli. No movement in lower extremities on noxious stimuli.  Pt still has fluid overload. BP stable. Temp 99.86. PR - 106-129/min. Pt had HD on 2/23 and 2/22. Na 141, K 4.5, creatinine elevated at 3.99, WBC's high at 14.7. Pt is on ASA 81 mg and brilinta 90 mg due to stenting. Blood culture positive for Staph aureus. Pt was started on Nafcilllin. TEE showedTricuspid valve endocarditis. 1.8 x 0.9 cm lesions likely on anterior tricuspid valve leaflet, likely adherent to atrial aspect of valve. Pt on nafcillin for CNS coverage too. CCM following. ID consulted, on board too. EEG showed suggestive of cortical dysfunction in right hemisphere due to underlying structural abnormality as well as moderate to severe diffuse encephalopathy OBJECTIVE  Temp:  [98.7 F (37.1 C)-99.86 F (37.7 C)] 99.86 F (37.7 C) (02/24 0600) Pulse Rate:  [106-129] 129 (02/24 1123) Cardiac Rhythm: Sinus tachycardia (02/23 2000) Resp:  [20-28] 27 (02/24 1123) BP: (105-144)/(45-81) 131/46 (02/24 1123) SpO2:  [95 %-100 %] 97 % (02/24 1123) Arterial Line BP: (86-127)/(36-47) 118/47 (02/24 0600) FiO2 (%):  [40 %] 40 % (02/24 1123) Weight:  [141.3 kg] 141.3 kg (02/24 1100)  Recent Labs  Lab 04/29/20 2358 04/30/20 0010 04/30/20 0321 04/30/20 0739 04/30/20 1110  GLUCAP 65* 108* 97 123* 134*   Recent Labs  Lab 04/26/20 1735 04/27/20 0444 04/27/20 1632 04/28/20 0500 04/28/20 1108 04/28/20 1521 04/28/20 2100 04/29/20 0604 04/30/20 0042  NA 139 140  --  141  --   --   --  144 141  K 5.0 5.4*  --  6.1* 6.2* 5.8*  --  5.0 4.5  CL 108 108  --  109  --   --   --  108 103  CO2 16* 16*  --  15*  --   --   --  19* 22  GLUCOSE 126* 126*  --  274*  --   --   --  73  104*  BUN 90* 101*  --  132*  --   --   --  127* 103*  CREATININE 3.71* 3.93*  --  4.38*  --   --   --  4.46* 3.99*  CALCIUM 8.1* 8.0*  --  7.8*  --   --   --  7.3* 7.3*  MG 2.1 2.1 2.3 2.3  --   --  2.2  --   --   PHOS 7.3* 8.2* 8.4* 9.3*  --   --   --   --   --    Recent Labs  Lab 04/24/2020 1417  AST 37  ALT 28  ALKPHOS 66  BILITOT 0.7  PROT 6.1*  ALBUMIN 2.4*   Recent Labs  Lab 04/15/2020 1417 05/01/2020 1609 04/26/20 0423 04/27/20 0444 04/28/20 0500 04/29/20 0604 04/30/20 0042  WBC 16.8*   < > 12.2* 12.2* 15.2* 16.4* 14.7*  NEUTROABS 15.3*  --   --   --   --   --   --   HGB 8.1*   < > 8.6* 8.6* 8.6* 8.3* 9.0*  HCT 25.0*   < > 27.5* 27.7* 28.0* 27.1* 28.3*  MCV 88.0   < > 89.6 89.6 91.2 89.4 87.1  PLT 251   < > 264  217 191 146* 142*   < > = values in this interval not displayed.   Recent Labs  Lab 04/28/20 1721  CKTOTAL 124   No results for input(s): LABPROT, INR in the last 72 hours. Recent Labs    04/28/20 0941  COLORURINE YELLOW  LABSPEC 1.020  PHURINE 5.0  GLUCOSEU NEGATIVE  HGBUR LARGE*  BILIRUBINUR NEGATIVE  KETONESUR NEGATIVE  PROTEINUR NEGATIVE  NITRITE NEGATIVE  LEUKOCYTESUR LARGE*       Component Value Date/Time   CHOL 57 04/26/2020 0423   TRIG 96 04/29/2020 0604   HDL <10 (L) 04/26/2020 0423   CHOLHDL NOT CALCULATED 04/26/2020 0423   VLDL 36 04/26/2020 0423   LDLCALC NOT CALCULATED 04/26/2020 0423   LDLCALC 121 (H) 07/16/2019 0751   Lab Results  Component Value Date   HGBA1C 9.8 (H) 04/26/2020   No results found for: LABOPIA, [place], [place], [place], [facility], [place]  Recent Labs  Lab 04/30/2020 [place] <10    I have personally reviewed the radiological images below and agree with the radiology interpretations.  EEG  Result Date: 04/29/2020 Lora Havens, MD     04/29/2020  5:02 PM Patient Name: Robert Daniel MRN: [id number] Epilepsy Attending: Lora Havens Referring Physician/Provider: Dr Lesleigh Noe Date:  04/29/2020 Duration: 32.42 mins Patient history: 80 year old male with worsening mental status.  EEG to evaluate for seizures. Level of alertness:  comatose AEDs during EEG study: None Technical aspects: This EEG study was done with scalp electrodes positioned according to the 10-20 International system of electrode placement. Electrical activity was acquired at a sampling rate of '500Hz'  and reviewed with a high frequency filter of '70Hz'  and a low frequency filter of '1Hz' . EEG data were recorded continuously and digitally stored. Description: EEG showed continuous 5 to 6 Hz theta slowing in left hemisphere as well as 2-'3Hz'  low amplitude delta slowing in right hemisphere.  Hyperventilation and photic stimulation were not performed.   ABNORMALITY -Continuous slow, generalized and lateralized right hemisphere IMPRESSION: This study is suggestive of cortical dysfunction in right hemisphere due to underlying structural abnormality as well as moderate to severe diffuse encephalopathy, nonspecific etiology. No seizures or epileptiform discharges were seen throughout the recording. Lora Havens   DG Chest 1 View  Result Date: 05/04/2020 CLINICAL DATA:  Acute shortness of breath.  CVA. EXAM: CHEST  1 VIEW COMPARISON:  07/08/2019 chest radiograph FINDINGS: This is a low volume study. The cardiomediastinal silhouette is unchanged. Mild pulmonary vascular congestion noted. There is no evidence of focal airspace disease, pulmonary edema, suspicious pulmonary nodule/mass, pleural effusion, or pneumothorax. No acute bony abnormalities are identified. IMPRESSION: Mild pulmonary vascular congestion. Electronically Signed   By: Margarette Canada M.D.   On: 04/07/2020 15:02   CT HEAD WO CONTRAST  Result Date: 04/28/2020 CLINICAL DATA:  Altered mental status. EXAM: CT HEAD WITHOUT CONTRAST TECHNIQUE: Contiguous axial images were obtained from the base of the skull through the vertex without intravenous contrast. COMPARISON:  MRI  April 26, 2020.  CT exams April 25, 2020. FINDINGS: Brain: Scattered cortical and subcortical areas of hypoattenuation within the right MCA distribution, compatible with evolving infarcts. No progressive mass effect or midline shift. Subtle cortical versus sulcal hyperdensity along the high right posterior frontal convexity may represent petechial hemorrhage versus trace subarachnoid hemorrhage. No mass occupying hemorrhagic transformation. No mass lesion. Additional mild patchy white matter hypoattenuation, likely related to chronic microvascular ischemic disease. Vascular: Right M2 MCA stent.  Calcific atherosclerosis. Skull: No acute  fracture. Sinuses/Orbits: Scattered paranasal sinus mucosal thickening. Other: No mastoid effusions. IMPRESSION: 1. Scattered cortical and subcortical areas of hypoattenuation within the right MCA distribution, compatible with evolving infarcts. No progressive mass effect. If there is concern for expansion of known infarcts, an MRI could better evaluate. 2. Subtle cortical versus sulcal hyperdensity along the high right posterior frontal convexity may represent petechial hemorrhage versus trace subarachnoid hemorrhage. No mass occupying hemorrhagic transformation. 3. Paranasal sinus mucosal thickening. These results will be called to the ordering clinician or representative by the Radiologist Assistant, and communication documented in the PACS or [employer]. Electronically Signed   By: Margaretha Sheffield MD   On: 04/28/2020 21:24   MR ANGIO HEAD WO CONTRAST  Result Date: 04/29/2020 CLINICAL DATA:  80 year old male with altered mental status. Scattered infarcts in the right hemisphere, occasionally in the left hemisphere on 04/26/2020. History of right MCA stent. Questionable petechial or subarachnoid hemorrhage along the right superior convexity on head CT yesterday. EXAM: MRI HEAD WITHOUT CONTRAST MRA HEAD WITHOUT CONTRAST TECHNIQUE: Multiplanar, multiecho pulse  sequences of the brain and surrounding structures were obtained without intravenous contrast. Angiographic images of the head were obtained using MRA technique without contrast. COMPARISON:  Brain MRI and MRA 04/26/2020.  Head CT yesterday. FINDINGS: MRI HEAD FINDINGS Brain: Increased size and confluence of multifocal restricted diffusion, now moderately affecting the right hemisphere in both right MCA and right PCA vascular territories. Confluent right corona radiata and frontal operculum progression (series 2, image 34). Similar increased conspicuity of small foci along the right internal capsule. More extensive cortical involvement elsewhere including at the left occipital pole, junction of the right temporal and occipital lobes. A few small scattered areas of cortical restricted diffusion in the left superior and posterior frontal lobe are also mildly increased (e.g. Series 2, image 40). There is also a small round new focus of restricted diffusion in the central midbrain to the right (series 2, image 21). And furthermore increased conspicuity of a similar small focus of restriction in the left cerebellar vermis (image 12). No other new areas of involvement identified. Progressed cytotoxic edema associated with the above. Mild new mass effect on the right lateral ventricle. No midline shift. Basilar cisterns remain patent. There is punctate but increased petechial or subarachnoid hemorrhage along the right central sulcus as suspected by CT (series 8, image 70 on [facility]). Stable several chronic microhemorrhages in the opposite left frontal lobe. Susceptibility artifact related to posterior right MCA branch stent. No intraventricular hemorrhage or ventriculomegaly. Cervicomedullary junction and pituitary are within normal limits. Vascular: Major intracranial vascular flow voids remain stable. Skull and upper cervical spine: Stable, negative for age. Sinuses/Orbits: Stable and negative orbits. Fluid in the nasopharynx  with right nasoenteric tube in place now. Stable fluid layering in the sphenoid sinuses. Mild paranasal sinus mucosal thickening elsewhere. New left greater than right mastoid fluid. Other: None. MRA HEAD FINDINGS Source images demonstrate stable antegrade flow signal compared to 04/26/20 throughout the anterior and posterior circulation. Diminutive vertebrobasilar system, somewhat dominant left vertebral artery. Less of the V4 segment is visible today. Distal vertebral and basilar artery irregularity but only mild associated stenosis. Basilar functionally terminates in the SCA is. Fetal type bilateral PCA origins and bilateral PCAs appears stable and within normal limits. Stable ICA siphons, mild tortuosity and irregularity without ICA stenosis. Ophthalmic and posterior communicating artery origins remain patent. Patent carotid termini. ACAs are stable and within normal limits, left A1 mildly dominant. Normal anterior communicating artery. Left MCA  M1 segment, left MCA bifurcation and visible left MCA branches are stable and within normal limits. Right MCA M1 remains patent. Patent right MCA bifurcation and proximal M2 branches. Right MCA M2 vascular stent susceptibility and visible right MCA branches appear stable. IMPRESSION: 1. Progressed size and extent of infarcts widely scattered in the right MCA and PCA territories since 04/26/2020. Associated petechial hemorrhage as suspected by CT. No malignant hemorrhagic transformation. Mild mass effect with no midline shift at this time. 2. Small new lacunar type infarcts in the left cerebellar vermis and right midbrain. Several small left MCA territory cortical infarcts again noted. 3. Stable intracranial MRA from 2 days ago. No large vessel occlusion. Stable MRA appearance of right MCA M2 stent. Electronically Signed   By: Genevie Ann M.D.   On: 04/29/2020 09:07   MR ANGIO HEAD WO CONTRAST  Result Date: 04/26/2020 CLINICAL DATA:  Stroke follow-up. EXAM: MRI HEAD  WITHOUT CONTRAST MRA HEAD WITHOUT CONTRAST TECHNIQUE: Multiplanar, multiecho pulse sequences of the brain and surrounding structures were obtained without intravenous contrast. Angiographic images of the head were obtained using MRA technique without contrast. COMPARISON:  CT a of the head neck from yesterday FINDINGS: MRI HEAD FINDINGS Brain: Patchy cortical infarction along the right MCA territory and reaching the right occipital cortex in this patient with large right posterior communicating artery. Two tiny cortical infarcts in the left MCA to distal ACA territory. No hemorrhage, mass effect, or hydrocephalus. Age normal brain volume. Mild pre-existing ischemic disease for age. Vascular: Arterial findings below. Skull and upper cervical spine: Normal marrow signal Sinuses/Orbits: Generalized mucosal thickening with fluid levels. MRA HEAD FINDINGS Right M2 stenting with expected associated artifact but visible internal flow on source images. On 3D reformats there is apparent high-grade stenosis with flow gap in the region of the M2 takeoff, but this is likely primarily from artifact based on source images. There is moderate atheromatous irregularity of bilateral MCA branches. Diffuse atheromatous irregularity of the left V4 segment and basilar with overall moderate stenosis except at the vertebrobasilar junction where narrowing appears high-grade. The right V4 segment is not seen until the patent PICA. IMPRESSION: 1. Scattered acute cortical infarcts in the right MCA territory and right occipital lobe. Two tiny cortical infarcts in the left cerebral hemisphere. 2. Present flow in the stented right M2 branch. 3. Advanced intracranial atherosclerosis as previously characterized. 4. Sinus inflammation. Electronically Signed   By: Monte Fantasia M.D.   On: 04/26/2020 04:34   MR BRAIN WO CONTRAST  Result Date: 04/29/2020 CLINICAL DATA:  80 year old male with altered mental status. Scattered infarcts in the right  hemisphere, occasionally in the left hemisphere on 04/26/2020. History of right MCA stent. Questionable petechial or subarachnoid hemorrhage along the right superior convexity on head CT yesterday. EXAM: MRI HEAD WITHOUT CONTRAST MRA HEAD WITHOUT CONTRAST TECHNIQUE: Multiplanar, multiecho pulse sequences of the brain and surrounding structures were obtained without intravenous contrast. Angiographic images of the head were obtained using MRA technique without contrast. COMPARISON:  Brain MRI and MRA 04/26/2020.  Head CT yesterday. FINDINGS: MRI HEAD FINDINGS Brain: Increased size and confluence of multifocal restricted diffusion, now moderately affecting the right hemisphere in both right MCA and right PCA vascular territories. Confluent right corona radiata and frontal operculum progression (series 2, image 34). Similar increased conspicuity of small foci along the right internal capsule. More extensive cortical involvement elsewhere including at the left occipital pole, junction of the right temporal and occipital lobes. A few small scattered areas of  cortical restricted diffusion in the left superior and posterior frontal lobe are also mildly increased (e.g. Series 2, image 40). There is also a small round new focus of restricted diffusion in the central midbrain to the right (series 2, image 21). And furthermore increased conspicuity of a similar small focus of restriction in the left cerebellar vermis (image 12). No other new areas of involvement identified. Progressed cytotoxic edema associated with the above. Mild new mass effect on the right lateral ventricle. No midline shift. Basilar cisterns remain patent. There is punctate but increased petechial or subarachnoid hemorrhage along the right central sulcus as suspected by CT (series 8, image 70 on [facility]). Stable several chronic microhemorrhages in the opposite left frontal lobe. Susceptibility artifact related to posterior right MCA branch stent. No  intraventricular hemorrhage or ventriculomegaly. Cervicomedullary junction and pituitary are within normal limits. Vascular: Major intracranial vascular flow voids remain stable. Skull and upper cervical spine: Stable, negative for age. Sinuses/Orbits: Stable and negative orbits. Fluid in the nasopharynx with right nasoenteric tube in place now. Stable fluid layering in the sphenoid sinuses. Mild paranasal sinus mucosal thickening elsewhere. New left greater than right mastoid fluid. Other: None. MRA HEAD FINDINGS Source images demonstrate stable antegrade flow signal compared to 04/26/20 throughout the anterior and posterior circulation. Diminutive vertebrobasilar system, somewhat dominant left vertebral artery. Less of the V4 segment is visible today. Distal vertebral and basilar artery irregularity but only mild associated stenosis. Basilar functionally terminates in the SCA is. Fetal type bilateral PCA origins and bilateral PCAs appears stable and within normal limits. Stable ICA siphons, mild tortuosity and irregularity without ICA stenosis. Ophthalmic and posterior communicating artery origins remain patent. Patent carotid termini. ACAs are stable and within normal limits, left A1 mildly dominant. Normal anterior communicating artery. Left MCA M1 segment, left MCA bifurcation and visible left MCA branches are stable and within normal limits. Right MCA M1 remains patent. Patent right MCA bifurcation and proximal M2 branches. Right MCA M2 vascular stent susceptibility and visible right MCA branches appear stable. IMPRESSION: 1. Progressed size and extent of infarcts widely scattered in the right MCA and PCA territories since 04/26/2020. Associated petechial hemorrhage as suspected by CT. No malignant hemorrhagic transformation. Mild mass effect with no midline shift at this time. 2. Small new lacunar type infarcts in the left cerebellar vermis and right midbrain. Several small left MCA territory cortical infarcts  again noted. 3. Stable intracranial MRA from 2 days ago. No large vessel occlusion. Stable MRA appearance of right MCA M2 stent. Electronically Signed   By: Genevie Ann M.D.   On: 04/29/2020 09:07   MR BRAIN WO CONTRAST  Result Date: 04/26/2020 CLINICAL DATA:  Stroke follow-up. EXAM: MRI HEAD WITHOUT CONTRAST MRA HEAD WITHOUT CONTRAST TECHNIQUE: Multiplanar, multiecho pulse sequences of the brain and surrounding structures were obtained without intravenous contrast. Angiographic images of the head were obtained using MRA technique without contrast. COMPARISON:  CT a of the head neck from yesterday FINDINGS: MRI HEAD FINDINGS Brain: Patchy cortical infarction along the right MCA territory and reaching the right occipital cortex in this patient with large right posterior communicating artery. Two tiny cortical infarcts in the left MCA to distal ACA territory. No hemorrhage, mass effect, or hydrocephalus. Age normal brain volume. Mild pre-existing ischemic disease for age. Vascular: Arterial findings below. Skull and upper cervical spine: Normal marrow signal Sinuses/Orbits: Generalized mucosal thickening with fluid levels. MRA HEAD FINDINGS Right M2 stenting with expected associated artifact but visible internal flow on source  images. On 3D reformats there is apparent high-grade stenosis with flow gap in the region of the M2 takeoff, but this is likely primarily from artifact based on source images. There is moderate atheromatous irregularity of bilateral MCA branches. Diffuse atheromatous irregularity of the left V4 segment and basilar with overall moderate stenosis except at the vertebrobasilar junction where narrowing appears high-grade. The right V4 segment is not seen until the patent PICA. IMPRESSION: 1. Scattered acute cortical infarcts in the right MCA territory and right occipital lobe. Two tiny cortical infarcts in the left cerebral hemisphere. 2. Present flow in the stented right M2 branch. 3. Advanced  intracranial atherosclerosis as previously characterized. 4. Sinus inflammation. Electronically Signed   By: Monte Fantasia M.D.   On: 04/26/2020 04:34   US RENAL  Result Date: 04/29/2020 CLINICAL DATA:  Acute kidney injury EXAM: RENAL / URINARY TRACT ULTRASOUND COMPLETE COMPARISON:  None. FINDINGS: Right Kidney: Renal measurements: 14.4 x 6 x 5.9 cm = volume: 265 mL. Cortical echogenicity normal. No hydronephrosis. Possible exophytic cyst off the midpole but poor visibility. Left Kidney: Renal measurements: 13.6 x 7.3 x 6.4 cm = volume: 332.5 mL. Echogenicity within normal limits. No hydronephrosis. Cyst at the lower pole measuring 6.3 cm. Bladder: Not well seen and presumably empty Other: None. IMPRESSION: 1. Negative for hydronephrosis. 2. Simple appearing left renal cyst Electronically Signed   By: Donavan Foil M.D.   On: 04/29/2020 00:11   IR Intra Cran Stent  Result Date: 04/27/2020 INDICATION: 80 year old male with past medical history significant for diabetes mellitus type 2, hypertension, hyperlipidemia and atrial fibrillation status post cardioversion in November 2021 on Xarelto. Patient had an episode of transient slurred speech and mild left arm weakness on 04/24/2020. Around noon on 04/15/2020, he developed significant left arm weakness, slurred speech and left facial droop. He was taken to outside hospital where he was found to have diminished opacification of the right MCA branches on CTA with area of increased T-max in the right MCA territory. He was taken to our service for a diagnostic cerebral angiogram and possible mechanical thrombectomy. EXAM: Ultrasound-guided vascular access Diagnostic cerebral angiogram Mechanical thrombectomy Intracranial stenting Flat panel head CT COMPARISON:  CT/CT angiogram of the head and neck April 25, 2020. MEDICATIONS: Refer to anesthesia documentation. ANESTHESIA/SEDATION: The procedure was performed under general anesthesia. CONTRAST:  75 mL of  Omnipaque 240 milligrams/mL FLUOROSCOPY TIME:  Fluoroscopy Time: 113 minutes 6 seconds (2,379 mGy). COMPLICATIONS: None immediate. TECHNIQUE: Informed written consent was obtained from the patient's sister after a thorough discussion of the procedural risks, benefits and alternatives. All questions were addressed. Maximal Sterile Barrier Technique was utilized including caps, mask, sterile gowns, sterile gloves, sterile drape, hand hygiene and skin antiseptic. A timeout was performed prior to the initiation of the procedure. The right groin was prepped and draped in the usual sterile fashion. Using a micropuncture kit and the modified Seldinger technique, access was gained to the right common femoral artery and an 8 French sheath was placed. Real-time ultrasound guidance was utilized for vascular access including the acquisition of a permanent ultrasound image documenting patency of the accessed vessel. Under fluoroscopy, a 6 [place] Berenstein 2 catheter was navigated over a 0.035" Terumo Glidewire into the aortic arch. The catheter was placed into the right common carotid artery and then advanced into the right internal carotid artery. Frontal and lateral angiograms of the head were obtained. FINDINGS: 1. Near occlusion of a proximal right M2/MCA middle division branch with minimal delayed contrast  penetration of distal branches. 2. Atherosclerotic changes of the right carotid siphon without hemodynamically significant stenosis. 3. Atherosclerotic changes of the right common femoral artery without hemodynamically significant stenosis. PROCEDURE: Under biplane roadmap, the Berenstein 2 catheter was exchanged over the wire for a zoom 88 guide catheter. The catheter was placed in the upper cervical segment of the right ICA. A zoom 71 catheter was then navigated through the guide catheter into the petrous segment of the right ICA. Magnified frontal and lateral views of the head were obtained. Then a phenom 21  microcatheter was navigated over a synchro support microguidewire into the right M1/MCA. Multiple attempts to gain access to the right M2 branch proved unsuccessful. The synchro support micro guidewire was then exchanged for a 0.016" headliner micro guidewire which was advanced into the distal right M2/MCA branch. The microcatheter was then advanced over the wire. The microwire was removed and a 4 x 40 solitaire thrombectomy device was deployed spanning the right M2/MCA middle division branch and distal M1 segment. The device was allowed to intercalated with the clot for 4 minutes. The aspiration catheter was advanced to the level of occlusion. The microcatheter was removed. The aspiration catheter and thrombectomy device were removed under constant aspiration. Follow-up right ICA angiograms with magnified frontal and lateral views of the head showed brisk opacification of the right M2/MCA middle division branch with irregularity noted at the branch origin [facility]). Delayed follow-up angiogram showed branch reocclusion. Under biplane roadmap, the zoom 71 aspiration catheter was again navigated over a phenom 21 microcatheter and a headliner microguidewire into the cavernous segment of the right ICA. The microcatheter was then navigated over the wire into the right M2/MCA middle division branch. Then, a 4 x 40 mm solitaire stent retriever was deployed spanning the M2 and distal M1 segments. The device was allowed to intercalated with the clot for 4 minutes. The microcatheter was removed. The aspiration catheter was advanced to the level of occlusion and connected to a penumbra aspiration pump. The thrombectomy device and aspiration catheter were removed under constant aspiration. Follow-up right ICA angiograms with magnified frontal and lateral views of the head showed brisk opacification of the right M2/MCA middle division branch with irregularity noted at the branch origin [facility]). Delayed follow-up angiogram  showed branch reocclusion. Under biplane roadmap, the zoom 71 aspiration catheter was again navigated over a phenom 21 microcatheter and a headliner microguidewire into the cavernous segment of the right ICA. The microcatheter was then navigated over the wire into the right M2/MCA middle division branch. Then, a 5 x 37 mm embotrap stent retriever was deployed spanning the M2 and distal M1 segments. The device was allowed to intercalated with the clot for 4 minutes. The microcatheter was removed. The aspiration catheter was advanced to the level of occlusion and connected to a penumbra aspiration pump. The thrombectomy device and aspiration catheter were removed under constant aspiration. Follow-up right ICA angiograms with magnified frontal and lateral views of the head showed recanalization of the right M2/MCA middle division branch with hairline stenosis at its origin due to atherosclerotic plaque and stenosis at the mid M2 segment with very delayed opacification of distal branches. Flat panel CT of the head was obtained and post processed in a separate workstation with concurrent attending physician supervision. Selected images were sent to PACS. No evidence of hemorrhagic complication noted. Under biplane roadmap, the zoom 71 aspiration catheter was again navigated over a phenom 21 microcatheter and a Aristotle 14 microguidewire into the cavernous segment  of the right ICA. The microcatheter was then navigated over the wire into the right M2/MCA middle division branch. The Aristotle microguidewire was exchanged for a an exchange length synchro microguidewire. At this point, patient developed severe bradycardia and eventually asystole. The microwire and microcatheter were removed. The table was lowered to start chest compressions. However, patient had spontaneous return of circulation before the start of resuscitation efforts. Follow-up right ICA angiograms with magnified frontal and lateral views of the head  again showed recanalization of the right M2/MCA middle division branch with with hairline stenosis. No evidence of contrast extravasation. Under biplane roadmap, an SL 10 microcatheter was navigated over Aristotle 14 microguidewire into the right M2/MCA middle division branch. Subsequently, a 4.5 x 30 mm neuroform atlas stent was deployed spanning the distal right M1, proximal and mid M2/MCA middle division branch. Follow-up angiogram showed brisk flow through the entire right MCA territory. The catheter construct was subsequently withdrawn. A right common femoral angiogram was performed in right anterior oblique view. The puncture is at the common femoral artery level. Atherosclerotic plaques are noted without hemodynamically significant stenosis. The sheath was exchanged for an 8 [place] Angio-Seal which was utilized for access closure. Immediate hemostasis was achieved. IMPRESSION: Mechanical thrombectomy and intracranial stenting performed for treatment of a proximal right M2/MCA middle division branch near occlusion with superimposed atherosclerotic plaque. A total of 3 passes performed with stent retriever followed by intracranial stenting. Intracranial stenting performed after bolus infusion of cangrelor followed by low-dose protocol drip. PLAN: Patient remained intubated and transferred to ICU given hemodynamic instability. Continue cangrelor drip until transition to aspirin and Brilinta. Family updated by telephone immediately after the end of the procedure. Electronically Signed   By: Pedro Earls M.D.   On: 04/27/2020 10:36   IR CT Head Ltd  Result Date: 04/27/2020 INDICATION: 80 year old male with past medical history significant for diabetes mellitus type 2, hypertension, hyperlipidemia and atrial fibrillation status post cardioversion in November 2021 on Xarelto. Patient had an episode of transient slurred speech and mild left arm weakness on 04/24/2020. Around noon on 04/07/2020,  he developed significant left arm weakness, slurred speech and left facial droop. He was taken to outside hospital where he was found to have diminished opacification of the right MCA branches on CTA with area of increased T-max in the right MCA territory. He was taken to our service for a diagnostic cerebral angiogram and possible mechanical thrombectomy. EXAM: Ultrasound-guided vascular access Diagnostic cerebral angiogram Mechanical thrombectomy Intracranial stenting Flat panel head CT COMPARISON:  CT/CT angiogram of the head and neck April 25, 2020. MEDICATIONS: Refer to anesthesia documentation. ANESTHESIA/SEDATION: The procedure was performed under general anesthesia. CONTRAST:  75 mL of Omnipaque 240 milligrams/mL FLUOROSCOPY TIME:  Fluoroscopy Time: 113 minutes 6 seconds (2,379 mGy). COMPLICATIONS: None immediate. TECHNIQUE: Informed written consent was obtained from the patient's sister after a thorough discussion of the procedural risks, benefits and alternatives. All questions were addressed. Maximal Sterile Barrier Technique was utilized including caps, mask, sterile gowns, sterile gloves, sterile drape, hand hygiene and skin antiseptic. A timeout was performed prior to the initiation of the procedure. The right groin was prepped and draped in the usual sterile fashion. Using a micropuncture kit and the modified Seldinger technique, access was gained to the right common femoral artery and an 8 French sheath was placed. Real-time ultrasound guidance was utilized for vascular access including the acquisition of a permanent ultrasound image documenting patency of the accessed vessel. Under fluoroscopy, a 6  [place] Berenstein 2 catheter was navigated over a 0.035" Terumo Glidewire into the aortic arch. The catheter was placed into the right common carotid artery and then advanced into the right internal carotid artery. Frontal and lateral angiograms of the head were obtained. FINDINGS: 1. Near occlusion of  a proximal right M2/MCA middle division branch with minimal delayed contrast penetration of distal branches. 2. Atherosclerotic changes of the right carotid siphon without hemodynamically significant stenosis. 3. Atherosclerotic changes of the right common femoral artery without hemodynamically significant stenosis. PROCEDURE: Under biplane roadmap, the Berenstein 2 catheter was exchanged over the wire for a zoom 88 guide catheter. The catheter was placed in the upper cervical segment of the right ICA. A zoom 71 catheter was then navigated through the guide catheter into the petrous segment of the right ICA. Magnified frontal and lateral views of the head were obtained. Then a phenom 21 microcatheter was navigated over a synchro support microguidewire into the right M1/MCA. Multiple attempts to gain access to the right M2 branch proved unsuccessful. The synchro support micro guidewire was then exchanged for a 0.016" headliner micro guidewire which was advanced into the distal right M2/MCA branch. The microcatheter was then advanced over the wire. The microwire was removed and a 4 x 40 solitaire thrombectomy device was deployed spanning the right M2/MCA middle division branch and distal M1 segment. The device was allowed to intercalated with the clot for 4 minutes. The aspiration catheter was advanced to the level of occlusion. The microcatheter was removed. The aspiration catheter and thrombectomy device were removed under constant aspiration. Follow-up right ICA angiograms with magnified frontal and lateral views of the head showed brisk opacification of the right M2/MCA middle division branch with irregularity noted at the branch origin [facility]). Delayed follow-up angiogram showed branch reocclusion. Under biplane roadmap, the zoom 71 aspiration catheter was again navigated over a phenom 21 microcatheter and a headliner microguidewire into the cavernous segment of the right ICA. The microcatheter was then  navigated over the wire into the right M2/MCA middle division branch. Then, a 4 x 40 mm solitaire stent retriever was deployed spanning the M2 and distal M1 segments. The device was allowed to intercalated with the clot for 4 minutes. The microcatheter was removed. The aspiration catheter was advanced to the level of occlusion and connected to a penumbra aspiration pump. The thrombectomy device and aspiration catheter were removed under constant aspiration. Follow-up right ICA angiograms with magnified frontal and lateral views of the head showed brisk opacification of the right M2/MCA middle division branch with irregularity noted at the branch origin [facility]). Delayed follow-up angiogram showed branch reocclusion. Under biplane roadmap, the zoom 71 aspiration catheter was again navigated over a phenom 21 microcatheter and a headliner microguidewire into the cavernous segment of the right ICA. The microcatheter was then navigated over the wire into the right M2/MCA middle division branch. Then, a 5 x 37 mm embotrap stent retriever was deployed spanning the M2 and distal M1 segments. The device was allowed to intercalated with the clot for 4 minutes. The microcatheter was removed. The aspiration catheter was advanced to the level of occlusion and connected to a penumbra aspiration pump. The thrombectomy device and aspiration catheter were removed under constant aspiration. Follow-up right ICA angiograms with magnified frontal and lateral views of the head showed recanalization of the right M2/MCA middle division branch with hairline stenosis at its origin due to atherosclerotic plaque and stenosis at the mid M2 segment with very delayed opacification of  distal branches. Flat panel CT of the head was obtained and post processed in a separate workstation with concurrent attending physician supervision. Selected images were sent to PACS. No evidence of hemorrhagic complication noted. Under biplane roadmap, the zoom 71  aspiration catheter was again navigated over a phenom 21 microcatheter and a Aristotle 14 microguidewire into the cavernous segment of the right ICA. The microcatheter was then navigated over the wire into the right M2/MCA middle division branch. The Aristotle microguidewire was exchanged for a an exchange length synchro microguidewire. At this point, patient developed severe bradycardia and eventually asystole. The microwire and microcatheter were removed. The table was lowered to start chest compressions. However, patient had spontaneous return of circulation before the start of resuscitation efforts. Follow-up right ICA angiograms with magnified frontal and lateral views of the head again showed recanalization of the right M2/MCA middle division branch with with hairline stenosis. No evidence of contrast extravasation. Under biplane roadmap, an SL 10 microcatheter was navigated over Aristotle 14 microguidewire into the right M2/MCA middle division branch. Subsequently, a 4.5 x 30 mm neuroform atlas stent was deployed spanning the distal right M1, proximal and mid M2/MCA middle division branch. Follow-up angiogram showed brisk flow through the entire right MCA territory. The catheter construct was subsequently withdrawn. A right common femoral angiogram was performed in right anterior oblique view. The puncture is at the common femoral artery level. Atherosclerotic plaques are noted without hemodynamically significant stenosis. The sheath was exchanged for an 8 [place] Angio-Seal which was utilized for access closure. Immediate hemostasis was achieved. IMPRESSION: Mechanical thrombectomy and intracranial stenting performed for treatment of a proximal right M2/MCA middle division branch near occlusion with superimposed atherosclerotic plaque. A total of 3 passes performed with stent retriever followed by intracranial stenting. Intracranial stenting performed after bolus infusion of cangrelor followed by low-dose  protocol drip. PLAN: Patient remained intubated and transferred to ICU given hemodynamic instability. Continue cangrelor drip until transition to aspirin and Brilinta. Family updated by telephone immediately after the end of the procedure. Electronically Signed   By: Pedro Earls M.D.   On: 04/27/2020 10:36   IR US Guide Vasc Access Right  Result Date: 04/27/2020 INDICATION: 80 year old male with past medical history significant for diabetes mellitus type 2, hypertension, hyperlipidemia and atrial fibrillation status post cardioversion in November 2021 on Xarelto. Patient had an episode of transient slurred speech and mild left arm weakness on 04/24/2020. Around noon on 04/16/2020, he developed significant left arm weakness, slurred speech and left facial droop. He was taken to outside hospital where he was found to have diminished opacification of the right MCA branches on CTA with area of increased T-max in the right MCA territory. He was taken to our service for a diagnostic cerebral angiogram and possible mechanical thrombectomy. EXAM: Ultrasound-guided vascular access Diagnostic cerebral angiogram Mechanical thrombectomy Intracranial stenting Flat panel head CT COMPARISON:  CT/CT angiogram of the head and neck April 25, 2020. MEDICATIONS: Refer to anesthesia documentation. ANESTHESIA/SEDATION: The procedure was performed under general anesthesia. CONTRAST:  75 mL of Omnipaque 240 milligrams/mL FLUOROSCOPY TIME:  Fluoroscopy Time: 113 minutes 6 seconds (2,379 mGy). COMPLICATIONS: None immediate. TECHNIQUE: Informed written consent was obtained from the patient's sister after a thorough discussion of the procedural risks, benefits and alternatives. All questions were addressed. Maximal Sterile Barrier Technique was utilized including caps, mask, sterile gowns, sterile gloves, sterile drape, hand hygiene and skin antiseptic. A timeout was performed prior to the initiation of the procedure.  The  right groin was prepped and draped in the usual sterile fashion. Using a micropuncture kit and the modified Seldinger technique, access was gained to the right common femoral artery and an 8 French sheath was placed. Real-time ultrasound guidance was utilized for vascular access including the acquisition of a permanent ultrasound image documenting patency of the accessed vessel. Under fluoroscopy, a 6 [place] Berenstein 2 catheter was navigated over a 0.035" Terumo Glidewire into the aortic arch. The catheter was placed into the right common carotid artery and then advanced into the right internal carotid artery. Frontal and lateral angiograms of the head were obtained. FINDINGS: 1. Near occlusion of a proximal right M2/MCA middle division branch with minimal delayed contrast penetration of distal branches. 2. Atherosclerotic changes of the right carotid siphon without hemodynamically significant stenosis. 3. Atherosclerotic changes of the right common femoral artery without hemodynamically significant stenosis. PROCEDURE: Under biplane roadmap, the Berenstein 2 catheter was exchanged over the wire for a zoom 88 guide catheter. The catheter was placed in the upper cervical segment of the right ICA. A zoom 71 catheter was then navigated through the guide catheter into the petrous segment of the right ICA. Magnified frontal and lateral views of the head were obtained. Then a phenom 21 microcatheter was navigated over a synchro support microguidewire into the right M1/MCA. Multiple attempts to gain access to the right M2 branch proved unsuccessful. The synchro support micro guidewire was then exchanged for a 0.016" headliner micro guidewire which was advanced into the distal right M2/MCA branch. The microcatheter was then advanced over the wire. The microwire was removed and a 4 x 40 solitaire thrombectomy device was deployed spanning the right M2/MCA middle division branch and distal M1 segment. The device was  allowed to intercalated with the clot for 4 minutes. The aspiration catheter was advanced to the level of occlusion. The microcatheter was removed. The aspiration catheter and thrombectomy device were removed under constant aspiration. Follow-up right ICA angiograms with magnified frontal and lateral views of the head showed brisk opacification of the right M2/MCA middle division branch with irregularity noted at the branch origin [facility]). Delayed follow-up angiogram showed branch reocclusion. Under biplane roadmap, the zoom 71 aspiration catheter was again navigated over a phenom 21 microcatheter and a headliner microguidewire into the cavernous segment of the right ICA. The microcatheter was then navigated over the wire into the right M2/MCA middle division branch. Then, a 4 x 40 mm solitaire stent retriever was deployed spanning the M2 and distal M1 segments. The device was allowed to intercalated with the clot for 4 minutes. The microcatheter was removed. The aspiration catheter was advanced to the level of occlusion and connected to a penumbra aspiration pump. The thrombectomy device and aspiration catheter were removed under constant aspiration. Follow-up right ICA angiograms with magnified frontal and lateral views of the head showed brisk opacification of the right M2/MCA middle division branch with irregularity noted at the branch origin [facility]). Delayed follow-up angiogram showed branch reocclusion. Under biplane roadmap, the zoom 71 aspiration catheter was again navigated over a phenom 21 microcatheter and a headliner microguidewire into the cavernous segment of the right ICA. The microcatheter was then navigated over the wire into the right M2/MCA middle division branch. Then, a 5 x 37 mm embotrap stent retriever was deployed spanning the M2 and distal M1 segments. The device was allowed to intercalated with the clot for 4 minutes. The microcatheter was removed. The aspiration catheter was advanced to  the level of occlusion  and connected to a penumbra aspiration pump. The thrombectomy device and aspiration catheter were removed under constant aspiration. Follow-up right ICA angiograms with magnified frontal and lateral views of the head showed recanalization of the right M2/MCA middle division branch with hairline stenosis at its origin due to atherosclerotic plaque and stenosis at the mid M2 segment with very delayed opacification of distal branches. Flat panel CT of the head was obtained and post processed in a separate workstation with concurrent attending physician supervision. Selected images were sent to PACS. No evidence of hemorrhagic complication noted. Under biplane roadmap, the zoom 71 aspiration catheter was again navigated over a phenom 21 microcatheter and a Aristotle 14 microguidewire into the cavernous segment of the right ICA. The microcatheter was then navigated over the wire into the right M2/MCA middle division branch. The Aristotle microguidewire was exchanged for a an exchange length synchro microguidewire. At this point, patient developed severe bradycardia and eventually asystole. The microwire and microcatheter were removed. The table was lowered to start chest compressions. However, patient had spontaneous return of circulation before the start of resuscitation efforts. Follow-up right ICA angiograms with magnified frontal and lateral views of the head again showed recanalization of the right M2/MCA middle division branch with with hairline stenosis. No evidence of contrast extravasation. Under biplane roadmap, an SL 10 microcatheter was navigated over Aristotle 14 microguidewire into the right M2/MCA middle division branch. Subsequently, a 4.5 x 30 mm neuroform atlas stent was deployed spanning the distal right M1, proximal and mid M2/MCA middle division branch. Follow-up angiogram showed brisk flow through the entire right MCA territory. The catheter construct was subsequently  withdrawn. A right common femoral angiogram was performed in right anterior oblique view. The puncture is at the common femoral artery level. Atherosclerotic plaques are noted without hemodynamically significant stenosis. The sheath was exchanged for an 8 [place] Angio-Seal which was utilized for access closure. Immediate hemostasis was achieved. IMPRESSION: Mechanical thrombectomy and intracranial stenting performed for treatment of a proximal right M2/MCA middle division branch near occlusion with superimposed atherosclerotic plaque. A total of 3 passes performed with stent retriever followed by intracranial stenting. Intracranial stenting performed after bolus infusion of cangrelor followed by low-dose protocol drip. PLAN: Patient remained intubated and transferred to ICU given hemodynamic instability. Continue cangrelor drip until transition to aspirin and Brilinta. Family updated by telephone immediately after the end of the procedure. Electronically Signed   By: Pedro Earls M.D.   On: 04/27/2020 10:36   CT CEREBRAL PERFUSION W CONTRAST  Result Date: 04/20/2020 CLINICAL DATA:  Slurred speech and left-sided weakness. EXAM: CT ANGIOGRAPHY HEAD AND NECK CT PERFUSION BRAIN TECHNIQUE: Multidetector CT imaging of the head and neck was performed using the standard protocol during bolus administration of intravenous contrast. Multiplanar CT image reconstructions and MIPs were obtained to evaluate the vascular anatomy. Carotid stenosis measurements (when applicable) are obtained utilizing NASCET criteria, using the distal internal carotid diameter as the denominator. Multiphase CT imaging of the brain was performed following IV bolus contrast injection. Subsequent parametric perfusion maps were calculated using RAPID software. CONTRAST:  65m OMNIPAQUE IOHEXOL 350 MG/ML SOLN COMPARISON:  None. FINDINGS: CT HEAD FINDINGS Brain: There is no evidence of an acute infarct, intracranial hemorrhage, mass,  midline shift, or extra-axial fluid collection. Mild cerebral atrophy is within normal limits for age. Patchy hypodensities in the cerebral white matter bilaterally are nonspecific but compatible with mild chronic small vessel ischemic disease. There is a chronic lacunar infarct at the inferior  aspect of the right caudate head. Vascular: Calcified atherosclerosis at the skull base. No hyperdense vessel. Skull: No fracture or suspicious osseous lesion. Sinuses/Orbits: Mild scattered mucosal thickening in the paranasal sinuses. Clear mastoid air cells. Bilateral cataract extraction. Other: None. ASPECTS ([place] Stroke Program Early CT Score) - Ganglionic level infarction (caudate, lentiform nuclei, internal capsule, insula, M1-M3 cortex): 7 - Supraganglionic infarction (M4-M6 cortex): 3 Total score (0-10 with 10 being normal): 10 Review of the MIP images confirms the above findings CTA NECK FINDINGS Aortic arch: Normal variant aortic arch branching pattern with common origin of the brachiocephalic and left common carotid arteries. Mild calcified plaque in the aortic arch without evidence of a significant arch vessel origin stenosis. Right carotid system: Patent with mild scattered predominantly calcified plaque throughout the common carotid artery and at the carotid bifurcation. No evidence of a significant stenosis or dissection. Left carotid system: Patent with mild plaque in the mid common carotid artery not resulting in significant stenosis. More extensive, heavily calcified plaque at the carotid bifurcation results in less than 50% proximal ICA stenosis. No evidence of dissection. Vertebral arteries: There is either severe stenosis or short segment occlusion of the proximal left vertebral artery at its origin with the vessel being patent throughout the remainder of the neck. There is a diffusely thready appearance of the right vertebral artery V1 and V2 segments with the right V3 segment being occluded.  Skeleton: Advanced cervical disc and facet degeneration. Other neck: No evidence of cervical lymphadenopathy or mass. Upper chest: Partially visualized small right pleural effusion. No apical lung consolidation. Review of the MIP images confirms the above findings CTA HEAD FINDINGS Anterior circulation: The internal carotid arteries are patent from skull base to carotid termini with calcified plaque resulting in mild cavernous and proximal supraclinoid stenosis bilaterally. The right M1 segment is patent with a mild stenosis proximally. There is a decreased number of distal right MCA branch vessels compared to the left, however a discrete proximal branch occlusion is not identified. The left MCA is patent with branch vessel irregularity as well as a mild-to-moderate proximal M2 stenosis. Both ACAs are patent without evidence of a significant proximal stenosis. No aneurysm is identified. Posterior circulation: The intracranial left vertebral artery is patent but diffusely irregular and narrowed with multiple moderate to severe stenoses. The right V4 segment is occluded proximally with likely retrograde filling distally supplying the right PICA. 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 Patent SCAs are seen bilaterally. There is a fetal origin of the PCAs without evidence of a significant proximal PCA stenosis. No aneurysm is identified. Venous sinuses: Patent. Anatomic variants: Fetal origin of the PCAs. Review of the MIP images confirms the above findings CT Brain Perfusion Findings: ASPECTS: 10 CBF (<30%) Volume: 0 mL Perfusion (Tmax>6.0s) volume: 55 mL Mismatch Volume: 55 mL Infarction Location: No core infarct evident by CTP. Ischemic penumbra in the right MCA territory, greatest in the frontal lobe at and superior to the operculum though with parietal and temporal lobe involvement as well.  IMPRESSION: CT HEAD: 1. No evidence of acute intracranial abnormality.  ASPECTS of 10. 2. Mild chronic small vessel ischemic disease. CTA HEAD AND NECK: 1. Decreased number of right MCA branch vessels without a discrete proximal occlusion identified. It is possible that this reflects a stump occlusion of a proximal to mid M2 branch which is not readily apparent by CTA. 2. Intracranial atherosclerosis with  multiple anterior and posterior circulation stenoses as detailed above. 3. 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 4. Severe stenosis or short segment occlusion of the left vertebral artery at its origin with patency of the vessel more distally. Heavily diseased left V4 segment. 5. Cervical carotid atherosclerosis without significant stenosis. 6.  Aortic Atherosclerosis (ICD10-I70.0). CT BRAIN PERFUSION: 55 mL of ischemic penumbra in the right MCA territory without evidence of a core infarct. These results were called by telephone at the time of interpretation on 04/16/2020 at 2:15 pm to Dr. Donnetta Simpers, who verbally acknowledged these results. Electronically Signed   By: Logan Bores M.D.   On: 04/30/2020 14:58   DG CHEST PORT 1 VIEW  Result Date: 04/29/2020 CLINICAL DATA:  Renal failure. EXAM: PORTABLE CHEST 1 VIEW COMPARISON:  April 28, 2020. FINDINGS: Stable cardiomediastinal silhouette. Endotracheal and feeding tubes are unchanged in position. Right internal jugular catheter is unchanged. No pneumothorax or pleural effusion is noted. New mild opacity is seen involving peripheral left midlung concerning for possible pneumonia. Mild right basilar subsegmental atelectasis is noted. Bony thorax is unremarkable. IMPRESSION: Stable support apparatus. New mild opacity is seen involving peripheral left midlung concerning for possible pneumonia. Electronically Signed   By: Marijo Conception M.D.   On: 04/29/2020 14:32   DG CHEST PORT 1 VIEW  Result Date: 04/28/2020 CLINICAL DATA:  Assess  positioning of hemodialysis catheter. EXAM: PORTABLE CHEST 1 VIEW COMPARISON:  Same day chest radiograph at 0900 hours. FINDINGS: Right IJ central venous catheter with tip overlying the superior vena cava. Endotracheal tube with tip overlying the distal thoracic trachea. Enteric tube coursing below the diaphragm with tip obscured by collimation. The heart size and mediastinal contours are unchanged. Aortic atherosclerosis. Similar bilateral hilar airspace opacities. No significant pleural effusion or visible pneumothorax. The visualized skeletal structures are unchanged. IMPRESSION: 1. Right IJ since venous catheter with tip overlying the superior vena cava. No visible pneumothorax. Electronically Signed   By: Dahlia Bailiff MD   On: 04/28/2020 12:27   DG CHEST PORT 1 VIEW  Result Date: 04/28/2020 CLINICAL DATA:  Hypoxia EXAM: PORTABLE CHEST 1 VIEW COMPARISON:  April 26, 2020 FINDINGS: Endotracheal tube tip is 2.2 cm above the carina. Enteric tube tip is below the diaphragm. No pneumothorax. There is airspace opacity overlying the right hilum, a change from 2 days prior. There is bibasilar atelectasis. Heart size and pulmonary vascularity are normal. No adenopathy. No bone lesions. There is aortic atherosclerosis. IMPRESSION: Tube positions as described without pneumothorax. Suspect pneumonia right perihilar region given new opacity in this area compared to 2 days prior. Bibasilar atelectasis. Stable cardiac silhouette. Aortic Atherosclerosis (ICD10-I70.0). Electronically Signed   By: Lowella Grip III M.D.   On: 04/28/2020 09:47   DG CHEST PORT 1 VIEW  Result Date: 04/26/2020 CLINICAL DATA:  Hypoxia EXAM: PORTABLE CHEST 1 VIEW COMPARISON:  05/04/2020 FINDINGS: The endotracheal tube terminates above the carina. There is elevation of the right hemidiaphragm as before. There are persistent but improving perihilar airspace opacities. There is no pneumothorax. No definite acute displaced fracture. No  large pleural effusion. IMPRESSION: 1. Improving bilateral perihilar airspace opacities. 2. Endotracheal tube as above. Electronically Signed   By: Constance Holster M.D.   On: 04/26/2020 15:20   DG CHEST PORT 1 VIEW  Result Date: 05/01/2020 CLINICAL DATA:  Endotracheal placement EXAM: PORTABLE CHEST 1 VIEW COMPARISON:  Earlier same day FINDINGS: Endotracheal tube tip 4 cm above the carina. There is worsened atelectasis in the  perihilar regions both sides. Possible pulmonary venous hypertension without Travon edema. No visible effusion. IMPRESSION: Endotracheal tube tip 4 cm above the carina. Worsened perihilar atelectasis. Possible pulmonary venous hypertension. No Gerrit edema. No visible effusion or pneumothorax. Electronically Signed   By: Nelson Chimes M.D.   On: 04/23/2020 19:29   DG Abd Portable 1V  Result Date: 04/29/2020 CLINICAL DATA:  Nasogastric tube placement. EXAM: PORTABLE ABDOMEN - 1 VIEW COMPARISON:  April 26, 2020. FINDINGS: The bowel gas pattern is normal. Feeding tube tip is seen in expected position of distal stomach. No radio-opaque calculi or other significant radiographic abnormality are seen. IMPRESSION: Feeding tube tip seen in expected position of distal stomach. No evidence of bowel obstruction or ileus. Electronically Signed   By: Marijo Conception M.D.   On: 04/29/2020 14:29   DG Abd Portable 1V  Result Date: 04/26/2020 CLINICAL DATA:  OG tube placement. EXAM: PORTABLE ABDOMEN - 1 VIEW COMPARISON:  None. FINDINGS: An enteric tube is identified with tip overlying the proximal-mid stomach. IMPRESSION: Enteric tube with tip overlying the proximal-mid stomach. Electronically Signed   By: Margarette Canada M.D.   On: 04/26/2020 09:28   ECHOCARDIOGRAM COMPLETE  Result Date: 04/27/2020    ECHOCARDIOGRAM REPORT   Patient Name:   Robert Daniel Date of Exam: 04/27/2020 Medical Rec #:  [id number]       Height:       73.0 in Accession #:    [id number]      Weight:       249.1 lb Date of  Birth:  [birth year]       BSA:          2.362 m Patient Age:    80 years        BP:           118/66 mmHg Patient Gender: M               HR:           81 bpm. Exam Location:  Inpatient Procedure: 2D Echo, Cardiac Doppler, Color Doppler and Intracardiac            Opacification Agent Indications:    Stroke  History:        Patient has prior history of Echocardiogram examinations, most                 recent 03/14/2017. Abnormal ECG, Stroke, Arrythmias:Atrial                 Fibrillation, Signs/Symptoms:Dyspnea and Shortness of Breath;                 Risk Factors:Hypertension, Diabetes and Dyslipidemia.  Sonographer:    Roseanna Rainbow RDCS Referring Phys: [id number] [facility]  Sonographer Comments: Technically difficult study due to poor echo windows, patient is morbidly obese and echo performed with patient supine and on artificial respirator. Image acquisition challenging due to patient body habitus. IMPRESSIONS  1. Poor acoustic windows and ectopy make accurate evaluation of LVEF difficult Definity used Overall LVEF is probably normal to mildly decreased Consider repeat when patient extubated, able to turn . There is moderate left ventricular hypertrophy. Left ventricular diastolic function could not be evaluated.  2. Right ventricular systolic function is mildly reduced. The right ventricular size is normal. Mildly increased right ventricular wall thickness.  3. The mitral valve is normal in structure. No evidence of mitral valve regurgitation.  4. The aortic valve is tricuspid. Aortic valve regurgitation is not  visualized. Mild to moderate aortic valve sclerosis/calcification is present, without any evidence of aortic stenosis.  5. The inferior vena cava is dilated in size with <50% respiratory variability, suggesting right atrial pressure of 15 mmHg. FINDINGS  Left Ventricle: Poor acoustic windows and ectopy make accurate evaluation of LVEF difficult Definity used Overall LVEF is probably normal to mildly decreased  Consider repeat when patient extubated, able to turn. Definity contrast agent was given IV to delineate the left ventricular endocardial borders. The left ventricular internal cavity size was normal in size. There is moderate left ventricular hypertrophy. Left ventricular diastolic function could not be evaluated. Right Ventricle: The right ventricular size is normal. Mildly increased right ventricular wall thickness. Right ventricular systolic function is mildly reduced. Left Atrium: Left atrial size was normal in size. Right Atrium: Right atrial size was normal in size. Pericardium: There is no evidence of pericardial effusion. Presence of pericardial fat pad. Mitral Valve: The mitral valve is normal in structure. No evidence of mitral valve regurgitation. Tricuspid Valve: The tricuspid valve is normal in structure. Tricuspid valve regurgitation is trivial. Aortic Valve: The aortic valve is tricuspid. Aortic valve regurgitation is not visualized. Mild to moderate aortic valve sclerosis/calcification is present, without any evidence of aortic stenosis. Pulmonic Valve: The pulmonic valve was not well visualized. Pulmonic valve regurgitation is not visualized. Aorta: The aortic root is normal in size and structure. Venous: The inferior vena cava is dilated in size with less than 50% respiratory variability, suggesting right atrial pressure of 15 mmHg. IAS/Shunts: The interatrial septum was not assessed.  LEFT VENTRICLE PLAX 2D LVIDd:         4.80 cm LVIDs:         3.90 cm LV PW:         2.00 cm LV IVS:        1.40 cm LVOT diam:     2.10 cm LV SV:         51 LV SV Index:   21 LVOT Area:     3.46 cm  LV Volumes (MOD) LV vol d, MOD A2C: 103.0 ml LV vol d, MOD A4C: 169.0 ml LV vol s, MOD A2C: 25.8 ml LV vol s, MOD A4C: 70.3 ml LV SV MOD A2C:     77.2 ml LV SV MOD A4C:     169.0 ml LV SV MOD BP:      92.2 ml RIGHT VENTRICLE            IVC RV S prime:     7.29 cm/s  IVC diam: 3.20 cm TAPSE (M-mode): 1.6 cm LEFT ATRIUM              Index       RIGHT ATRIUM           Index LA diam:        5.30 cm 2.24 cm/m  RA Area:     17.00 cm LA Vol (A2C):   69.6 ml 29.46 ml/m RA Volume:   48.20 ml  20.41 ml/m LA Vol (A4C):   94.2 ml 39.88 ml/m LA Biplane Vol: 83.1 ml 35.18 ml/m  AORTIC VALVE LVOT Vmax:   90.80 cm/s LVOT Vmean:  65.000 cm/s LVOT VTI:    0.146 m  AORTA Ao Root diam: 3.70 cm Ao Asc diam:  3.60 cm MITRAL VALVE MV Area (PHT): 5.38 cm     SHUNTS MV Decel Time: 141 msec     Systemic VTI:  0.15 m MV E  velocity: 107.90 cm/s  Systemic Diam: 2.10 cm Dorris Carnes MD Electronically signed by Dorris Carnes MD Signature Date/Time: 04/27/2020/7:51:44 PM    Final    ECHO TEE  Result Date: 04/29/2020    TRANSESOPHOGEAL ECHO REPORT   Patient Name:   Robert Daniel Date of Exam: 04/29/2020 Medical Rec #:  [id number]       Height:       75.0 in Accession #:    [id number]      Weight:       317.5 lb Date of Birth:  04/10/1940       BSA:          2.671 m Patient Age:    64 years        BP:           125/50 mmHg Patient Gender: M               HR:           116 bpm. Exam Location:  Inpatient Procedure: Transesophageal Echo, Cardiac Doppler and Color Doppler Indications:     Stroke 434.91 / I63.9  History:         Patient has prior history of Echocardiogram examinations, most                  recent 04/27/2020. Arrythmias:Atrial Fibrillation,                  Signs/Symptoms:Shortness of Breath; Risk Factors:Hypertension,                  Diabetes and Dyslipidemia.  Sonographer:     Darlina Sicilian RDCS Referring Phys:  [id number] Lorenza Chick Diagnosing Phys: Cherlynn Kaiser MD PROCEDURE: After discussion of the risks and benefits of a TEE, an informed consent was obtained from a family member. The patient was intubated. TEE procedure time was 17 minutes. The transesophogeal probe was passed without difficulty through the esophogus of the patient. Imaged were obtained with the patient in a supine position. Local oropharyngeal anesthetic was provided with  Cetacaine. Sedation performed by different physician. The patient was monitored while under deep sedation. Anesthestetic sedation was provided intravenously by Anesthesiology: 61m of Propofol. Image quality was good. The patient's vital signs; including heart rate, blood pressure, and oxygen saturation; remained stable throughout the procedure. The patient developed no complications during the procedure. IMPRESSIONS  1. 1.8 x 0.9 cm mobile echodensity on the atrial aspect of the tricuspid valve. Attachment point is most likely anterior tricuspid valve leaflet. 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 The tricuspid valve is abnormal. Tricuspid valve regurgitation is mild to moderate.  2. Left ventricular ejection fraction, by estimation, is 45 to 50%. The left ventricle has mildly decreased function. There is moderate left ventricular hypertrophy.  3. Right ventricular systolic function is mildly reduced. The right ventricular size is mildly enlarged.  4. Left atrial size was moderately dilated. No left atrial/left atrial appendage thrombus was detected. The LAA emptying velocity was 72 cm/s.  5. Right atrial size was mild to moderately dilated.  6. The mitral valve is degenerative. Mild mitral valve regurgitation.  7. The aortic valve is abnormal. There is mild calcification of the aortic valve. Aortic valve regurgitation is trivial. No aortic stenosis is present.  8. Aortic dilatation noted. There is borderline dilatation of the aortic root and of the ascending aorta, measuring 40 mm. There is Severe (Grade IV) atheroma plaque involving the transverse and descending aorta.  9. Agitated saline contrast bubble study was negative, with no evidence of any interatrial shunt. FINDINGS  Left Ventricle: Left ventricular ejection fraction, by estimation, is 45 to 50%. The left ventricle has mildly decreased function. The left ventricular internal cavity size was normal in size. There  is moderate left ventricular hypertrophy. Right Ventricle: The right ventricular size is mildly enlarged. Right vetricular wall thickness was not well visualized. Right ventricular systolic function is mildly reduced. Left Atrium: Left atrial size was moderately dilated. No left atrial/left atrial appendage thrombus was detected. The LAA emptying velocity was 72 cm/s. Right Atrium: Right atrial size was mild to moderately dilated. Pericardium: There is no evidence of pericardial effusion. Mitral Valve: The mitral valve is degenerative in appearance. Mild mitral valve regurgitation. Tricuspid Valve: 1.8 x 0.9 cm mobile echodensity on the atrial aspect of the tricuspid valve. Attachment point is most likely anterior tricuspid valve leaflet. 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 The tricuspid valve is abnormal. Tricuspid valve regurgitation is mild to moderate. Aortic Valve: The aortic valve is abnormal. There is mild calcification of the aortic valve. Aortic valve regurgitation is trivial. No aortic stenosis is present. Aortic valve mean gradient measures 4.0 mmHg. Aortic valve peak gradient measures 7.2 mmHg.  Aortic valve area, by VTI measures 3.02 cm. Pulmonic Valve: The pulmonic valve was grossly normal. Pulmonic valve regurgitation is trivial. Aorta: Aortic dilatation noted. There is borderline dilatation of the aortic root and of the ascending aorta, measuring 40 mm. There is severe (Grade IV) atheroma plaque involving the transverse and descending aorta. IAS/Shunts: No atrial level shunt detected by color flow Doppler. Agitated saline contrast was given intravenously to evaluate for intracardiac shunting. Agitated saline contrast bubble study was negative, with no evidence of any interatrial shunt.  LEFT VENTRICLE PLAX 2D LVOT diam:     2.10 cm LV SV:         56 LV SV Index:   21 LVOT Area:     3.46 cm  AORTIC VALVE AV Area (Vmax):    2.84 cm AV Area (Vmean):   2.71  cm AV Area (VTI):     3.02 cm AV Vmax:           134.00 cm/s AV Vmean:          94.700 cm/s AV VTI:            0.186 m AV Peak Grad:      7.2 mmHg AV Mean Grad:      4.0 mmHg LVOT Vmax:         110.00 cm/s LVOT Vmean:        74.100 cm/s LVOT VTI:          0.162 m LVOT/AV VTI ratio: 0.87  AORTA Ao Root diam: 4.00 cm Ao Asc diam:  4.00 cm TRICUSPID VALVE TR Peak grad:   30.9 mmHg TR Vmax:        278.00 cm/s  SHUNTS Systemic VTI:  0.16 m Systemic Diam: 2.10 cm Cherlynn Kaiser MD Electronically signed by Cherlynn Kaiser MD Signature Date/Time: 04/29/2020/9:50:53 PM    Final    IR PERCUTANEOUS ART THROMBECTOMY/INFUSION INTRACRANIAL INC DIAG ANGIO  Result Date: 04/27/2020 INDICATION: 80 year old male with past medical history significant for diabetes mellitus type 2, hypertension, hyperlipidemia and atrial fibrillation status post cardioversion in November 2021 on Xarelto. Patient had an episode of transient slurred speech and mild left arm weakness on 04/24/2020. Around noon on 04/21/2020, he developed significant left  arm weakness, slurred speech and left facial droop. He was taken to outside hospital where he was found to have diminished opacification of the right MCA branches on CTA with area of increased T-max in the right MCA territory. He was taken to our service for a diagnostic cerebral angiogram and possible mechanical thrombectomy. EXAM: Ultrasound-guided vascular access Diagnostic cerebral angiogram Mechanical thrombectomy Intracranial stenting Flat panel head CT COMPARISON:  CT/CT angiogram of the head and neck April 25, 2020. MEDICATIONS: Refer to anesthesia documentation. ANESTHESIA/SEDATION: The procedure was performed under general anesthesia. CONTRAST:  75 mL of Omnipaque 240 milligrams/mL FLUOROSCOPY TIME:  Fluoroscopy Time: 113 minutes 6 seconds (2,379 mGy). COMPLICATIONS: None immediate. TECHNIQUE: Informed written consent was obtained from the patient's sister after a thorough discussion of  the procedural risks, benefits and alternatives. All questions were addressed. Maximal Sterile Barrier Technique was utilized including caps, mask, sterile gowns, sterile gloves, sterile drape, hand hygiene and skin antiseptic. A timeout was performed prior to the initiation of the procedure. The right groin was prepped and draped in the usual sterile fashion. Using a micropuncture kit and the modified Seldinger technique, access was gained to the right common femoral artery and an 8 French sheath was placed. Real-time ultrasound guidance was utilized for vascular access including the acquisition of a permanent ultrasound image documenting patency of the accessed vessel. Under fluoroscopy, a 6 [place] Berenstein 2 catheter was navigated over a 0.035" Terumo Glidewire into the aortic arch. The catheter was placed into the right common carotid artery and then advanced into the right internal carotid artery. Frontal and lateral angiograms of the head were obtained. FINDINGS: 1. Near occlusion of a proximal right M2/MCA middle division branch with minimal delayed contrast penetration of distal branches. 2. Atherosclerotic changes of the right carotid siphon without hemodynamically significant stenosis. 3. Atherosclerotic changes of the right common femoral artery without hemodynamically significant stenosis. PROCEDURE: Under biplane roadmap, the Berenstein 2 catheter was exchanged over the wire for a zoom 88 guide catheter. The catheter was placed in the upper cervical segment of the right ICA. A zoom 71 catheter was then navigated through the guide catheter into the petrous segment of the right ICA. Magnified frontal and lateral views of the head were obtained. Then a phenom 21 microcatheter was navigated over a synchro support microguidewire into the right M1/MCA. Multiple attempts to gain access to the right M2 branch proved unsuccessful. The synchro support micro guidewire was then exchanged for a 0.016" headliner  micro guidewire which was advanced into the distal right M2/MCA branch. The microcatheter was then advanced over the wire. The microwire was removed and a 4 x 40 solitaire thrombectomy device was deployed spanning the right M2/MCA middle division branch and distal M1 segment. The device was allowed to intercalated with the clot for 4 minutes. The aspiration catheter was advanced to the level of occlusion. The microcatheter was removed. The aspiration catheter and thrombectomy device were removed under constant aspiration. Follow-up right ICA angiograms with magnified frontal and lateral views of the head showed brisk opacification of the right M2/MCA middle division branch with irregularity noted at the branch origin [facility]). Delayed follow-up angiogram showed branch reocclusion. Under biplane roadmap, the zoom 71 aspiration catheter was again navigated over a phenom 21 microcatheter and a headliner microguidewire into the cavernous segment of the right ICA. The microcatheter was then navigated over the wire into the right M2/MCA middle division branch. Then, a 4 x 40 mm solitaire stent retriever was deployed spanning the  M2 and distal M1 segments. The device was allowed to intercalated with the clot for 4 minutes. The microcatheter was removed. The aspiration catheter was advanced to the level of occlusion and connected to a penumbra aspiration pump. The thrombectomy device and aspiration catheter were removed under constant aspiration. Follow-up right ICA angiograms with magnified frontal and lateral views of the head showed brisk opacification of the right M2/MCA middle division branch with irregularity noted at the branch origin [facility]). Delayed follow-up angiogram showed branch reocclusion. Under biplane roadmap, the zoom 71 aspiration catheter was again navigated over a phenom 21 microcatheter and a headliner microguidewire into the cavernous segment of the right ICA. The microcatheter was then navigated  over the wire into the right M2/MCA middle division branch. Then, a 5 x 37 mm embotrap stent retriever was deployed spanning the M2 and distal M1 segments. The device was allowed to intercalated with the clot for 4 minutes. The microcatheter was removed. The aspiration catheter was advanced to the level of occlusion and connected to a penumbra aspiration pump. The thrombectomy device and aspiration catheter were removed under constant aspiration. Follow-up right ICA angiograms with magnified frontal and lateral views of the head showed recanalization of the right M2/MCA middle division branch with hairline stenosis at its origin due to atherosclerotic plaque and stenosis at the mid M2 segment with very delayed opacification of distal branches. Flat panel CT of the head was obtained and post processed in a separate workstation with concurrent attending physician supervision. Selected images were sent to PACS. No evidence of hemorrhagic complication noted. Under biplane roadmap, the zoom 71 aspiration catheter was again navigated over a phenom 21 microcatheter and a Aristotle 14 microguidewire into the cavernous segment of the right ICA. The microcatheter was then navigated over the wire into the right M2/MCA middle division branch. The Aristotle microguidewire was exchanged for a an exchange length synchro microguidewire. At this point, patient developed severe bradycardia and eventually asystole. The microwire and microcatheter were removed. The table was lowered to start chest compressions. However, patient had spontaneous return of circulation before the start of resuscitation efforts. Follow-up right ICA angiograms with magnified frontal and lateral views of the head again showed recanalization of the right M2/MCA middle division branch with with hairline stenosis. No evidence of contrast extravasation. Under biplane roadmap, an SL 10 microcatheter was navigated over Aristotle 14 microguidewire into the right  M2/MCA middle division branch. Subsequently, a 4.5 x 30 mm neuroform atlas stent was deployed spanning the distal right M1, proximal and mid M2/MCA middle division branch. Follow-up angiogram showed brisk flow through the entire right MCA territory. The catheter construct was subsequently withdrawn. A right common femoral angiogram was performed in right anterior oblique view. The puncture is at the common femoral artery level. Atherosclerotic plaques are noted without hemodynamically significant stenosis. The sheath was exchanged for an 8 [place] Angio-Seal which was utilized for access closure. Immediate hemostasis was achieved. IMPRESSION: Mechanical thrombectomy and intracranial stenting performed for treatment of a proximal right M2/MCA middle division branch near occlusion with superimposed atherosclerotic plaque. A total of 3 passes performed with stent retriever followed by intracranial stenting. Intracranial stenting performed after bolus infusion of cangrelor followed by low-dose protocol drip. PLAN: Patient remained intubated and transferred to ICU given hemodynamic instability. Continue cangrelor drip until transition to aspirin and Brilinta. Family updated by telephone immediately after the end of the procedure. Electronically Signed   By: Pedro Earls M.D.   On: 04/27/2020 10:36  CT ANGIO HEAD CODE STROKE  Result Date: 04/12/2020 CLINICAL DATA:  Slurred speech and left-sided weakness. EXAM: CT ANGIOGRAPHY HEAD AND NECK CT PERFUSION BRAIN TECHNIQUE: Multidetector CT imaging of the head and neck was performed using the standard protocol during bolus administration of intravenous contrast. Multiplanar CT image reconstructions and MIPs were obtained to evaluate the vascular anatomy. Carotid stenosis measurements (when applicable) are obtained utilizing NASCET criteria, using the distal internal carotid diameter as the denominator. Multiphase CT imaging of the brain was performed  following IV bolus contrast injection. Subsequent parametric perfusion maps were calculated using RAPID software. CONTRAST:  46m OMNIPAQUE IOHEXOL 350 MG/ML SOLN COMPARISON:  None. FINDINGS: CT HEAD FINDINGS Brain: There is no evidence of an acute infarct, intracranial hemorrhage, mass, midline shift, or extra-axial fluid collection. Mild cerebral atrophy is within normal limits for age. Patchy hypodensities in the cerebral white matter bilaterally are nonspecific but compatible with mild chronic small vessel ischemic disease. There is a chronic lacunar infarct at the inferior aspect of the right caudate head. Vascular: Calcified atherosclerosis at the skull base. No hyperdense vessel. Skull: No fracture or suspicious osseous lesion. Sinuses/Orbits: Mild scattered mucosal thickening in the paranasal sinuses. Clear mastoid air cells. Bilateral cataract extraction. Other: None. ASPECTS ([place]roke Program Early CT Score) - Ganglionic level infarction (caudate, lentiform nuclei, internal capsule, insula, M1-M3 cortex): 7 - Supraganglionic infarction (M4-M6 cortex): 3 Total score (0-10 with 10 being normal): 10 Review of the MIP images confirms the above findings CTA NECK FINDINGS Aortic arch: Normal variant aortic arch branching pattern with common origin of the brachiocephalic and left common carotid arteries. Mild calcified plaque in the aortic arch without evidence of a significant arch vessel origin stenosis. Right carotid system: Patent with mild scattered predominantly calcified plaque throughout the common carotid artery and at the carotid bifurcation. No evidence of a significant stenosis or dissection. Left carotid system: Patent with mild plaque in the mid common carotid artery not resulting in significant stenosis. More extensive, heavily calcified plaque at the carotid bifurcation results in less than 50% proximal ICA stenosis. No evidence of dissection. Vertebral arteries: There is either severe  stenosis or short segment occlusion of the proximal left vertebral artery at its origin with the vessel being patent throughout the remainder of the neck. There is a diffusely thready appearance of the right vertebral artery V1 and V2 segments with the right V3 segment being occluded. Skeleton: Advanced cervical disc and facet degeneration. Other neck: No evidence of cervical lymphadenopathy or mass. Upper chest: Partially visualized small right pleural effusion. No apical lung consolidation. Review of the MIP images confirms the above findings CTA HEAD FINDINGS Anterior circulation: The internal carotid arteries are patent from skull base to carotid termini with calcified plaque resulting in mild cavernous and proximal supraclinoid stenosis bilaterally. The right M1 segment is patent with a mild stenosis proximally. There is a decreased number of distal right MCA branch vessels compared to the left, however a discrete proximal branch occlusion is not identified. The left MCA is patent with branch vessel irregularity as well as a mild-to-moderate proximal M2 stenosis. Both ACAs are patent without evidence of a significant proximal stenosis. No aneurysm is identified. Posterior circulation: The intracranial left vertebral artery is patent but diffusely irregular and narrowed with multiple moderate to severe stenoses. The right V4 segment is occluded proximally with likely retrograde filling distally supplying the right PICA. The basilar artery is patent but diffusely small and irregular which is partly congenital though with  suspected superimposed atherosclerotic narrowing as well. Patent SCAs are seen bilaterally. There is a fetal origin of the PCAs without evidence of a significant proximal PCA stenosis. No aneurysm is identified. Venous sinuses: Patent. Anatomic variants: Fetal origin of the PCAs. Review of the MIP images confirms the above findings CT Brain Perfusion Findings: ASPECTS: 10 CBF (<30%) Volume: 0 mL  Perfusion (Tmax>6.0s) volume: 55 mL Mismatch Volume: 55 mL Infarction Location: No core infarct evident by CTP. Ischemic penumbra in the right MCA territory, greatest in the frontal lobe at and superior to the operculum though with parietal and temporal lobe involvement as well. IMPRESSION: CT HEAD: 1. No evidence of acute intracranial abnormality.  ASPECTS of 10. 2. Mild chronic small vessel ischemic disease. CTA HEAD AND NECK: 1. Decreased number of right MCA branch vessels without a discrete proximal occlusion identified. It is possible that this reflects a stump occlusion of a proximal to mid M2 branch which is not readily apparent by CTA. 2. Intracranial atherosclerosis with multiple anterior and posterior circulation stenoses as detailed above. 3. 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 4. Severe stenosis or short segment occlusion of the left vertebral artery at its origin with patency of the vessel more distally. Heavily diseased left V4 segment. 5. Cervical carotid atherosclerosis without significant stenosis. 6.  Aortic Atherosclerosis (ICD10-I70.0). CT BRAIN PERFUSION: 55 mL of ischemic penumbra in the right MCA territory without evidence of a core infarct. These results were called by telephone at the time of interpretation on 04/15/2020 at 2:15 pm to Dr. Donnetta Simpers, who verbally acknowledged these results. Electronically Signed   By: Logan Bores M.D.   On: 04/24/2020 14:58   CT ANGIO NECK CODE STROKE  Result Date: 04/19/2020 CLINICAL DATA:  Slurred speech and left-sided weakness. EXAM: CT ANGIOGRAPHY HEAD AND NECK CT PERFUSION BRAIN TECHNIQUE: Multidetector CT imaging of the head and neck was performed using the standard protocol during bolus administration of intravenous contrast. Multiplanar CT image reconstructions and MIPs were obtained to evaluate the vascular anatomy. Carotid stenosis measurements (when applicable) are obtained utilizing NASCET criteria, using the distal  internal carotid diameter as the denominator. Multiphase CT imaging of the brain was performed following IV bolus contrast injection. Subsequent parametric perfusion maps were calculated using RAPID software. CONTRAST:  70m OMNIPAQUE IOHEXOL 350 MG/ML SOLN COMPARISON:  None. FINDINGS: CT HEAD FINDINGS Brain: There is no evidence of an acute infarct, intracranial hemorrhage, mass, midline shift, or extra-axial fluid collection. Mild cerebral atrophy is within normal limits for age. Patchy hypodensities in the cerebral white matter bilaterally are nonspecific but compatible with mild chronic small vessel ischemic disease. There is a chronic lacunar infarct at the inferior aspect of the right caudate head. Vascular: Calcified atherosclerosis at the skull base. No hyperdense vessel. Skull: No fracture or suspicious osseous lesion. Sinuses/Orbits: Mild scattered mucosal thickening in the paranasal sinuses. Clear mastoid air cells. Bilateral cataract extraction. Other: None. ASPECTS ([place]roke Program Early CT Score) - Ganglionic level infarction (caudate, lentiform nuclei, internal capsule, insula, M1-M3 cortex): 7 - Supraganglionic infarction (M4-M6 cortex): 3 Total score (0-10 with 10 being normal): 10 Review of the MIP images confirms the above findings CTA NECK FINDINGS Aortic arch: Normal variant aortic arch branching pattern with common origin of the brachiocephalic and left common carotid arteries. Mild calcified plaque in the aortic arch without evidence of a significant arch vessel origin stenosis. Right carotid system: Patent with mild scattered predominantly calcified plaque throughout the common carotid artery and at the  carotid bifurcation. No evidence of a significant stenosis or dissection. Left carotid system: Patent with mild plaque in the mid common carotid artery not resulting in significant stenosis. More extensive, heavily calcified plaque at the carotid bifurcation results in less than 50%  proximal ICA stenosis. No evidence of dissection. Vertebral arteries: There is either severe stenosis or short segment occlusion of the proximal left vertebral artery at its origin with the vessel being patent throughout the remainder of the neck. There is a diffusely thready appearance of the right vertebral artery V1 and V2 segments with the right V3 segment being occluded. Skeleton: Advanced cervical disc and facet degeneration. Other neck: No evidence of cervical lymphadenopathy or mass. Upper chest: Partially visualized small right pleural effusion. No apical lung consolidation. Review of the MIP images confirms the above findings CTA HEAD FINDINGS Anterior circulation: The internal carotid arteries are patent from skull base to carotid termini with calcified plaque resulting in mild cavernous and proximal supraclinoid stenosis bilaterally. The right M1 segment is patent with a mild stenosis proximally. There is a decreased number of distal right MCA branch vessels compared to the left, however a discrete proximal branch occlusion is not identified. The left MCA is patent with branch vessel irregularity as well as a mild-to-moderate proximal M2 stenosis. Both ACAs are patent without evidence of a significant proximal stenosis. No aneurysm is identified. Posterior circulation: The intracranial left vertebral artery is patent but diffusely irregular and narrowed with multiple moderate to severe stenoses. The right V4 segment is occluded proximally with likely retrograde filling distally supplying the right PICA. 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 Patent SCAs are seen bilaterally. There is a fetal origin of the PCAs without evidence of a significant proximal PCA stenosis. No aneurysm is identified. Venous sinuses: Patent. Anatomic variants: Fetal origin of the PCAs. Review of the MIP images  confirms the above findings CT Brain Perfusion Findings: ASPECTS: 10 CBF (<30%) Volume: 0 mL Perfusion (Tmax>6.0s) volume: 55 mL Mismatch Volume: 55 mL Infarction Location: No core infarct evident by CTP. Ischemic penumbra in the right MCA territory, greatest in the frontal lobe at and superior to the operculum though with parietal and temporal lobe involvement as well. IMPRESSION: CT HEAD: 1. No evidence of acute intracranial abnormality.  ASPECTS of 10. 2. Mild chronic small vessel ischemic disease. CTA HEAD AND NECK: 1. Decreased number of right MCA branch vessels without a discrete proximal occlusion identified. It is possible that this reflects a stump occlusion of a proximal to mid M2 branch which is not readily apparent by CTA. 2. Intracranial atherosclerosis with multiple anterior and posterior circulation stenoses as detailed above. 3. 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 4. Severe stenosis or short segment occlusion of the left vertebral artery at its origin with patency of the vessel more distally. Heavily diseased left V4 segment. 5. Cervical carotid atherosclerosis without significant stenosis. 6.  Aortic Atherosclerosis (ICD10-I70.0). CT BRAIN PERFUSION: 55 mL of ischemic penumbra in the right MCA territory without evidence of a core infarct. These results were called by telephone at the time of interpretation on 04/12/2020 at 2:15 pm to Dr. Donnetta Simpers, who verbally acknowledged these results. Electronically Signed   By: Logan Bores M.D.   On: 04/10/2020 14:58    PHYSICAL EXAM  Temp:  [98.7 F (37.1 C)-99.86 F (37.7 C)] 99.86 F (37.7 C) (02/24 0600) Pulse Rate:  [106-129] 129 (  02/24 1123) Resp:  [20-28] 27 (02/24 1123) BP: (105-144)/(45-81) 131/46 (02/24 1123) SpO2:  [95 %-100 %] 97 % (02/24 1123) Arterial Line BP: (86-127)/(36-47) 118/47 (02/24 0600) FiO2 (%):  [40 %] 40 % (02/24 1123) Weight:  [141.3 kg] 141.3 kg (02/24 1100)  General - Well nourished, well  developed, intubated, off sedation.  Ophthalmologic - fundi not visualized due to noncooperation.  Cardiovascular - irregularly irregular heart rate and rhythm.  Neuro - On exam, intubated on precedex, stuperous, eyes closed, doesn't open eyes on noxious stimuli . Not  able to follow simple commands, however, Pt able to squeeze Rt hand weakly on request. With forced eye opening, eyes in mid position, not blinking to visual threat, doll's eyes sluggish, not tracking. Corneal reflex weakly present, gag and cough present. Breathing barely over the vent.  Facial symmetry not able to test due to ET tube.  Tongue protrusion not cooperative.Not able to raise against gravity. No Movement of lower and upper Extremities on noxious stimuli. DTR diminished and right positive babinski. Sensation, coordination and gait not tested.  ASSESSMENT/PLAN Robert Daniel is a 80 y.o. male with history of PMHx of DM II, HTN, AF s/p cardioversion in 11/2019 on Xarelto, and HLD transfer from [facility] for significant left arm weakness, slurred speech and left facial droop. Time onset 1200pm. NIHSS = 4. CT no acute finding. CTA head and neck left ICA bulb and b/l ICA siphon severe athero with stenosis. Possible right M2 branch occlusion. CTP right MCA penumbra. Last dose Xarelto last night. Pt not tPA candidate given on Xarelto. He was transferred to [facility] for EVT.  ECHO showed Overall LVEF is probably normal to mildly decreased. CXR done showed Suspect pneumonia right perihilar region.CT head on 2/22 showed Scattered cortical and subcortical areas of hypoattenuation within the right MCA distribution, compatible with evolving infarcts. No progressive mass effect. Subtle cortical versus sulcal hyperdensity along the high right posterior frontal convexity may represent petechial hemorrhage versus trace subarachnoid hemorrhage.   Blood c/s and urine c/s positive for Staph Aureus. Nafcillin started on 04/29/20. Nephro consulted, had HD on  2/22 and 2/23.TEE showedTricuspid valve endocarditis. 1.8 x 0.9 cm lesions likely on anterior tricuspid valve leaflet, likely adherent to atrial aspect of valve. Pt on nafcillin for CNS coverage too. CCM following. ID consulted, on board too. EEG showed suggestive of cortical dysfunction in right hemisphere due to underlying structural abnormality as well as moderate to severe diffuse encephalopathy  Stroke:  right MCA scattered infarcts and two left ACA punctate infarcts due to left M2 occlusion s/p stenting, more likely large vessel stenosis given plaque occluding left M2. However, also possible due to AF despite on Xarelto, or related to endocarditis  CT no acute finding.   CTA head and neck left ICA bulb and b/l ICA siphon severe athero with stenosis. Questionable right M2 branch occlusion.   CTP right MCA penumbra.  IR - right M2 branch occlusion with plaque - s/p stenting  MRI  Scattered acute cortical infarcts in the right MCA territory and right occipital lobe. Two tiny cortical infarcts in the left cerebral hemisphere.  MRA  Present flow in the stented right M2 branch.  CT head 2/22 -Scattered cortical and subcortical areas of hypoattenuation within the right MCA distribution, compatible with evolving infarcts.   2D Echo- Overall LVEF is probably normal to mildly decreased.   TEE Tricuspid valve endocarditis.  EF 45%  LDL < 10  HgbA1c 9.8  Heparin subq for VTE prophylaxis  Xarelto (rivaroxaban) daily prior to admission, now on aspirin 81 mg daily and Brilinta (ticagrelor) 90 mg bid due to stenting. Will resume AC once appropriate  Ongoing aggressive stroke risk factor management  Therapy recommendations:  pending  Disposition:  pending  Respiratory failure  Intubated for procedure  Not able to extubate after procedure  CCM on board  Endocarditis  TEE showed Tricuspid valve endocarditis. 1.8 x 0.9 cm lesions likely on anterior tricuspid valve leaflet, likely  adherent to atrial aspect of valve.   On nafcillin for CNS coverage too  Encephalopathy   EEG cortical dysfunction in right hemisphere due to underlying structural abnormality as well as moderate to severe diffuse encephalopathy  ? Bacterial meningitis - not stable for LP, could be partial treated so CSF may not have good interpretation - continue nafcillin for CNS coverage.  Cardiac arrest  CHF  Cardiac arrest for 20-30s and ROSC - no CPR needed  Concerning for CHF   CXR showed no Tyrian edema  TTE Overall LVEF is probably normal to mildly decreased.  TEE EF 45%  Tele monitoring  Chronic AF   s/p cardioversion in 11/2019   on Xarelto  Rate controlled  Need to resume AC once appropriate  Intracranial stenosis  CTA head and neck left ICA bulb and b/l ICA siphon severe athero with stenosis.  Avoid low BP  Long term BP goal 130-150  Diabetes  HgbA1c 9.8 goal < 7.0  Uncontrolled  CBG monitoring  SSI  Hypoglycemia on 2/20- BG-45, on 2/23- CBG's-23, 67  On tube feeding now  DM education and close PCP follow up  Hypertension . Fluctuate according to sedation status . BP fluctuate  . Off cleviprex and neo  Long term BP goal 130-150 given intracranial stenosis  Hyperlipidemia  Home meds:  lipitor 80   LDL < 10, goal < 70  on lipitor 80  Continue statin at discharge  AKI ? Vs. CKD ?  Cre 3.00->3.35->3.71>3.93>4.38>4.46>3.99->3.83  Hyperkalemia - 7.2->5.5->5.0>5.4>6.1>Lokelma>5.0>4.5  CCM on board  Nephro on Board  On HD -> CRRT  Could be related to iodine contrast  Fluid Overload  Likely due to worsening renal function  Started on Lasix on 2/21  Lasix Stopped per Nephrology  HD per Nephro  Fever UTI Pneumonia Leucocytosis Septic shock   100.4>101.8>102.38>99.14>99.86  Leukocytosis - WBC 16.1->12.2>15.2>16.4>14.7  CXR- suspected pneumonia right perihilar region.  UA WBC > 50  UA C/s - Staph aureus  Blood C/s- Staph  aureus  Pt on Nafcillin (2/23)  Monitor Temp  CCM Following  Buspar started on 2/22  On levophed for BP control  ID consulted  Anemia   Hb 8.6->8.3>9.0  Received 2 units PRBC on 2/19  Monitor CBC  CCM following.  Iron 14, TIBC 99, Ferritin 867.  Transfuse per protocol.  Other Stroke Risk Factors  Advanced age  Obesity, Body mass index is 38.94 kg/m.   Other Active Problems    Hospital day # 5   ATTENDING NOTE: I reviewed above note and agree with the assessment and plan. Pt was seen and examined.   No family at the bedside. Neuro no significant change, eyes closed, not open on voice, not blinking to visual threat, not following commands, except grip on the right hand on request but not able to release as requested. Not moving extremities. On CRRT, however, Cre, Hb, WBC all trending in good direction. Continue current management. Discussed with Dr. Lynetta Mare.  For detailed assessment and plan, please refer to above as  I have made changes wherever appropriate.   Rosalin Hawking, MD PhD Stroke Neurology 04/30/2020 6:54 PM  This patient is critically ill due to right MCA infarct s/p IR, endocarditis, sepsis, AKI, cardiac arrest, CHF, Afib on AC and at significant risk of neurological worsening, death form recurrent stroke, spetic shock, renal failure, heart failure, seizure. This patient's care requires constant monitoring of vital signs, hemodynamics, respiratory and cardiac monitoring, review of multiple databases, neurological assessment, discussion with family, other specialists and medical decision making of high complexity. I spent 35 minutes of neurocritical care time in the care of this patient. I have discussed with Dr. Lynetta Mare.   To contact Stroke Continuity provider, please refer to [email or web address] After hours, contact General Neurology

## 2020-05-01 DIAGNOSIS — J9601 Acute respiratory failure with hypoxia: Secondary | ICD-10-CM | POA: Diagnosis not present

## 2020-05-01 DIAGNOSIS — A419 Sepsis, unspecified organism: Secondary | ICD-10-CM

## 2020-05-01 DIAGNOSIS — Z01818 Encounter for other preprocedural examination: Secondary | ICD-10-CM | POA: Diagnosis not present

## 2020-05-01 DIAGNOSIS — R6521 Severe sepsis with septic shock: Secondary | ICD-10-CM

## 2020-05-01 DIAGNOSIS — D72829 Elevated white blood cell count, unspecified: Secondary | ICD-10-CM

## 2020-05-01 DIAGNOSIS — I63019 Cerebral infarction due to thrombosis of unspecified vertebral artery: Secondary | ICD-10-CM

## 2020-05-01 DIAGNOSIS — I330 Acute and subacute infective endocarditis: Secondary | ICD-10-CM

## 2020-05-01 DIAGNOSIS — B9561 Methicillin susceptible Staphylococcus aureus infection as the cause of diseases classified elsewhere: Secondary | ICD-10-CM | POA: Diagnosis not present

## 2020-05-01 DIAGNOSIS — L8990 Pressure ulcer of unspecified site, unspecified stage: Secondary | ICD-10-CM | POA: Insufficient documentation

## 2020-05-01 DIAGNOSIS — I63411 Cerebral infarction due to embolism of right middle cerebral artery: Secondary | ICD-10-CM | POA: Diagnosis not present

## 2020-05-01 DIAGNOSIS — N179 Acute kidney failure, unspecified: Secondary | ICD-10-CM | POA: Diagnosis not present

## 2020-05-01 DIAGNOSIS — N390 Urinary tract infection, site not specified: Secondary | ICD-10-CM

## 2020-05-01 LAB — POCT I-STAT 7, (LYTES, BLD GAS, ICA,H+H)
Acid-base deficit: 11 mmol/L — ABNORMAL HIGH (ref 0.0–2.0)
Acid-base deficit: 9 mmol/L — ABNORMAL HIGH (ref 0.0–2.0)
Bicarbonate: 16.6 mmol/L — ABNORMAL LOW (ref 20.0–28.0)
Bicarbonate: 19.1 mmol/L — ABNORMAL LOW (ref 20.0–28.0)
Calcium, Ion: 0.87 mmol/L — CL (ref 1.15–1.40)
Calcium, Ion: 0.92 mmol/L — ABNORMAL LOW (ref 1.15–1.40)
HCT: 29 % — ABNORMAL LOW (ref 39.0–52.0)
HCT: 30 % — ABNORMAL LOW (ref 39.0–52.0)
Hemoglobin: 10.2 g/dL — ABNORMAL LOW (ref 13.0–17.0)
Hemoglobin: 9.9 g/dL — ABNORMAL LOW (ref 13.0–17.0)
O2 Saturation: 97 %
O2 Saturation: 99 %
Patient temperature: 96.9
Patient temperature: 97.7
Potassium: 6 mmol/L — ABNORMAL HIGH (ref 3.5–5.1)
Potassium: 6.2 mmol/L — ABNORMAL HIGH (ref 3.5–5.1)
Sodium: 134 mmol/L — ABNORMAL LOW (ref 135–145)
Sodium: 134 mmol/L — ABNORMAL LOW (ref 135–145)
TCO2: 18 mmol/L — ABNORMAL LOW (ref 22–32)
TCO2: 21 mmol/L — ABNORMAL LOW (ref 22–32)
pCO2 arterial: 39.1 mmHg (ref 32.0–48.0)
pCO2 arterial: 47.2 mmHg (ref 32.0–48.0)
pH, Arterial: 7.213 — ABNORMAL LOW (ref 7.350–7.450)
pH, Arterial: 7.23 — ABNORMAL LOW (ref 7.350–7.450)
pO2, Arterial: 109 mmHg — ABNORMAL HIGH (ref 83.0–108.0)
pO2, Arterial: 187 mmHg — ABNORMAL HIGH (ref 83.0–108.0)

## 2020-05-01 LAB — RENAL FUNCTION PANEL
Albumin: 1.6 g/dL — ABNORMAL LOW (ref 3.5–5.0)
Albumin: 1.6 g/dL — ABNORMAL LOW (ref 3.5–5.0)
Anion gap: 14 (ref 5–15)
Anion gap: 16 — ABNORMAL HIGH (ref 5–15)
BUN: 83 mg/dL — ABNORMAL HIGH (ref 8–23)
BUN: 75 mg/dL — ABNORMAL HIGH (ref 8–23)
CO2: 21 mmol/L — ABNORMAL LOW (ref 22–32)
CO2: 19 mmol/L — ABNORMAL LOW (ref 22–32)
Calcium: 7 mg/dL — ABNORMAL LOW (ref 8.9–10.3)
Calcium: 6.9 mg/dL — ABNORMAL LOW (ref 8.9–10.3)
Chloride: 101 mmol/L (ref 98–111)
Chloride: 100 mmol/L (ref 98–111)
Creatinine, Ser: 3.14 mg/dL — ABNORMAL HIGH (ref 0.61–1.24)
Creatinine, Ser: 2.91 mg/dL — ABNORMAL HIGH (ref 0.61–1.24)
GFR, Estimated: 19 mL/min — ABNORMAL LOW (ref >60)
GFR, Estimated: 21 mL/min — ABNORMAL LOW (ref >60)
Glucose, Bld: 175 mg/dL — ABNORMAL HIGH (ref 70–99)
Glucose, Bld: 133 mg/dL — ABNORMAL HIGH (ref 70–99)
Phosphorus: 7.2 mg/dL — ABNORMAL HIGH (ref 2.5–4.6)
Phosphorus: 7.8 mg/dL — ABNORMAL HIGH (ref 2.5–4.6)
Potassium: 5.4 mmol/L — ABNORMAL HIGH (ref 3.5–5.1)
Potassium: 6.1 mmol/L — ABNORMAL HIGH (ref 3.5–5.1)
Sodium: 136 mmol/L (ref 135–145)
Sodium: 135 mmol/L (ref 135–145)

## 2020-05-01 LAB — GLUCOSE, CAPILLARY
Glucose-Capillary: 105 mg/dL — ABNORMAL HIGH (ref 70–99)
Glucose-Capillary: 11 mg/dL — CL (ref 70–99)
Glucose-Capillary: 117 mg/dL — ABNORMAL HIGH (ref 70–99)
Glucose-Capillary: 146 mg/dL — ABNORMAL HIGH (ref 70–99)
Glucose-Capillary: 173 mg/dL — ABNORMAL HIGH (ref 70–99)
Glucose-Capillary: 61 mg/dL — ABNORMAL LOW (ref 70–99)
Glucose-Capillary: 68 mg/dL — ABNORMAL LOW (ref 70–99)
Glucose-Capillary: 78 mg/dL (ref 70–99)
Glucose-Capillary: 82 mg/dL (ref 70–99)
Glucose-Capillary: 88 mg/dL (ref 70–99)

## 2020-05-01 LAB — CBC
HCT: 27.4 % — ABNORMAL LOW (ref 39.0–52.0)
Hemoglobin: 8.8 g/dL — ABNORMAL LOW (ref 13.0–17.0)
MCH: 28.1 pg (ref 26.0–34.0)
MCHC: 32.1 g/dL (ref 30.0–36.0)
MCV: 87.5 fL (ref 80.0–100.0)
Platelets: 127 10*3/uL — ABNORMAL LOW (ref 150–400)
RBC: 3.13 MIL/uL — ABNORMAL LOW (ref 4.22–5.81)
RDW: 16.3 % — ABNORMAL HIGH (ref 11.5–15.5)
WBC: 14.3 10*3/uL — ABNORMAL HIGH (ref 4.0–10.5)
nRBC: 0.5 % — ABNORMAL HIGH (ref 0.0–0.2)

## 2020-05-01 LAB — CULTURE, RESPIRATORY W GRAM STAIN

## 2020-05-01 LAB — POTASSIUM: Potassium: 6.2 mmol/L — ABNORMAL HIGH (ref 3.5–5.1)

## 2020-05-01 LAB — MAGNESIUM: Magnesium: 2.5 mg/dL — ABNORMAL HIGH (ref 1.7–2.4)

## 2020-05-01 MED ORDER — DEXTROSE 50 % IV SOLN
INTRAVENOUS | Status: AC
  Filled 2020-05-01: qty 50

## 2020-05-01 MED ORDER — ALBUMIN HUMAN 25 % IV SOLN
25.0000 g | Freq: Once | INTRAVENOUS | Status: AC
  Administered 2020-05-01: 25 g via INTRAVENOUS
  Filled 2020-05-01: qty 100

## 2020-05-01 MED ORDER — ACETAMINOPHEN 325 MG PO TABS: 650.0000 mg | ORAL_TABLET | ORAL | Status: DC | PRN

## 2020-05-01 MED ORDER — NOREPINEPHRINE 16 MG/250ML-% IV SOLN
0.0000 ug/min | INTRAVENOUS | Status: DC
  Administered 2020-05-01: 33 ug/min via INTRAVENOUS
  Administered 2020-05-02: 75 ug/min via INTRAVENOUS
  Administered 2020-05-02: 53 ug/min via INTRAVENOUS
  Filled 2020-05-01 (×4): qty 250

## 2020-05-01 MED ORDER — DEXTROSE 50 % IV SOLN
12.5000 g | Freq: Once | INTRAVENOUS | Status: AC
  Administered 2020-05-01: 12.5 g via INTRAVENOUS

## 2020-05-01 MED ORDER — PRISMASOL BGK 0/2.5 32-2.5 MEQ/L EC SOLN
Status: DC
  Filled 2020-05-01 (×3): qty 5000

## 2020-05-01 MED ORDER — DEXTROSE 50 % IV SOLN
25.0000 g | Freq: Once | INTRAVENOUS | Status: AC
  Administered 2020-05-01: 25 g via INTRAVENOUS

## 2020-05-01 MED ORDER — POLYSACCHARIDE IRON COMPLEX 150 MG PO CAPS
150.0000 mg | ORAL_CAPSULE | Freq: Every day | ORAL | Status: DC
  Administered 2020-05-01: 150 mg
  Filled 2020-05-01 (×2): qty 1

## 2020-05-01 MED ORDER — PRISMASOL BGK 0/2.5 32-2.5 MEQ/L EC SOLN
Status: DC
  Filled 2020-05-01 (×11): qty 5000

## 2020-05-01 MED ORDER — VASOPRESSIN 20 UNITS/100 ML INFUSION FOR SHOCK
0.0000 [IU]/min | INTRAVENOUS | Status: DC
  Administered 2020-05-01 – 2020-05-02 (×2): 0.03 [IU]/min via INTRAVENOUS
  Filled 2020-05-01 (×2): qty 100

## 2020-05-01 MED ORDER — SODIUM ZIRCONIUM CYCLOSILICATE 10 G PO PACK
10.0000 g | PACK | Freq: Once | ORAL | Status: DC
  Filled 2020-05-01: qty 1

## 2020-05-01 MED ORDER — POLYSACCHARIDE IRON COMPLEX 150 MG PO CAPS
150.0000 mg | ORAL_CAPSULE | Freq: Every day | ORAL | Status: DC
  Filled 2020-05-01: qty 1

## 2020-05-01 MED ORDER — PRISMASOL BGK 0/2.5 32-2.5 MEQ/L EC SOLN
Status: DC
  Filled 2020-05-01 (×9): qty 5000

## 2020-05-01 MED ORDER — SODIUM ZIRCONIUM CYCLOSILICATE 10 G PO PACK
10.0000 g | PACK | Freq: Once | ORAL | Status: AC
  Administered 2020-05-01: 10 g
  Filled 2020-05-01: qty 1

## 2020-05-01 MED ORDER — ACETAMINOPHEN 650 MG RE SUPP: 650.0000 mg | RECTAL | Status: DC | PRN

## 2020-05-01 MED ORDER — ACETAMINOPHEN 160 MG/5ML PO SOLN: 650.0000 mg | ORAL | Status: DC | PRN

## 2020-05-01 MED ORDER — NEPRO/CARBSTEADY PO LIQD
55.0000 mL/h | ORAL | Status: DC
  Administered 2020-05-01: 55 mL/h
  Filled 2020-05-01: qty 237
  Filled 2020-05-01: qty 1000
  Filled 2020-05-01: qty 237

## 2020-05-01 MED ORDER — SENNOSIDES-DOCUSATE SODIUM 8.6-50 MG PO TABS: 1.0000 | ORAL_TABLET | Freq: Every evening | ORAL | Status: DC | PRN

## 2020-05-01 NOTE — Progress Notes (Signed)
[facility] for Infectious Disease    Date of Admission:  04/26/2020   Total days of antibiotics 4/nafcillin           ID: Robert Daniel is a 80 y.o. male with stroke consistent with MSSA endocarditis Active Problems:   Stroke (cerebrum) ([place])   AKI (acute kidney injury) ([place])   Encounter for central line placement   Pressure injury of skin    Subjective: Afebrile, moving left arm per nurse report, opens eyes to verbal stimuli.tolerating HD  24Hr: FiO2 decrease, CXR showing less pulmonary edema  Medications:  . aspirin  81 mg Per Tube Daily  . atorvastatin  80 mg Per Tube Daily  . chlorhexidine gluconate (MEDLINE KIT)  15 mL Mouth Rinse BID  . Chlorhexidine Gluconate Cloth  6 each Topical Q0600  . feeding supplement (PROSource TF)  45 mL Per Tube BID  . fiber  1 packet Per Tube BID  . heparin injection (subcutaneous)  5,000 Units Subcutaneous Q8H  . insulin aspart  0-15 Units Subcutaneous Q4H  . iron polysaccharides  150 mg Per Tube Daily  . mouth rinse  15 mL Mouth Rinse 10 times per day  . multivitamin  1 tablet Per Tube QHS  . pantoprazole sodium  40 mg Per Tube Q2000  . ticagrelor  90 mg Per Tube BID    Objective: Vital signs in last 24 hours: Temp:  [96.4 F (35.8 C)-98.6 F (37 C)] 96.4 F (35.8 C) (02/25 0800) Pulse Rate:  [91-114] 101 (02/25 1108) Resp:  [15-31] 21 (02/25 1300) BP: (102-124)/(44-49) 124/49 (02/25 1108) SpO2:  [89 %-100 %] 99 % (02/25 1108) Arterial Line BP: (86-147)/(44-69) 122/49 (02/25 1300) FiO2 (%):  [40 %-50 %] 40 % (02/25 1108) Weight:  [141.2 kg] 141.2 kg (02/25 0500) Physical Exam  Constitutional: He is oriented to person, place, and time. He appears well-developed and well-nourished. No distress.  HENT:  Mouth/Throat: Oropharynx is clear and moist. No oropharyngeal exudate.  Cardiovascular: Normal rate, regular rhythm and normal heart sounds. Exam reveals no gallop and no friction rub.  No murmur heard.   Pulmonary/Chest: Effort normal and breath sounds normal. No respiratory distress. He has no wheezes.  Abdominal: Soft. Bowel sounds are normal. He exhibits no distension. There is no tenderness.  ELF:YBOFBP pitting edema, anasarca Skin: left hand 2nd digit, discoloration ?ischemia   Lab Results Recent Labs    04/30/20 0042 04/30/20 1700 05/01/20 0512 05/01/20 0848  WBC 14.7*  --   --  14.3*  HGB 9.0*  --   --  8.8*  HCT 28.3*  --   --  27.4*  NA 141 139 136  --   K 4.5 4.9 5.4*  --   CL 103 104 101  --   CO2 22 20* 21*  --   BUN 103* 102* 83*  --   CREATININE 3.99* 3.83* 3.14*  --    Liver Panel Recent Labs    04/30/20 1700 05/01/20 0512  ALBUMIN 1.7* 1.6*    Microbiology:  2/24 blood cx NGTD @ day 1 2/22 urine cx MSSA 2/22 blood cx MSSA Studies/Results: EEG  Result Date: 04/29/2020 Lora Havens, MD     04/29/2020  5:02 PM Patient Name: Robert Daniel MRN: [id number] Epilepsy Attending: Lora Havens Referring Physician/Provider: Dr Lesleigh Noe Date: 04/29/2020 Duration: 32.42 mins Patient history: 80 year old male with worsening mental status.  EEG to evaluate for seizures. Level of alertness:  comatose AEDs during EEG study: None  Technical aspects: This EEG study was done with scalp electrodes positioned according to the 10-20 International system of electrode placement. Electrical activity was acquired at a sampling rate of $Remov'500Hz'qVTdbA$  and reviewed with a high frequency filter of $RemoveB'70Hz'PIKxJsXt$  and a low frequency filter of $RemoveB'1Hz'sREiHzoP$ . EEG data were recorded continuously and digitally stored. Description: EEG showed continuous 5 to 6 Hz theta slowing in left hemisphere as well as 2-$RemoveB'3Hz'bjCKlqYm$  low amplitude delta slowing in right hemisphere.  Hyperventilation and photic stimulation were not performed.   ABNORMALITY -Continuous slow, generalized and lateralized right hemisphere IMPRESSION: This study is suggestive of cortical dysfunction in right hemisphere due to underlying structural  abnormality as well as moderate to severe diffuse encephalopathy, nonspecific etiology. No seizures or epileptiform discharges were seen throughout the recording. Priyanka [name]   DG CHEST PORT 1 VIEW  Result Date: 04/30/2020 CLINICAL DATA:  Central line placement EXAM: PORTABLE CHEST 1 VIEW COMPARISON:  Portable exam 1610 hours compared to 04/29/2020 FINDINGS: RIGHT jugular central venous catheter with tip projecting over SVC. Feeding tube extends into stomach. Tip of endotracheal tube projects 6.6 cm above carina. Normal heart size mediastinal contours. Atherosclerotic calcification aorta. Bibasilar atelectasis versus infiltrate. Improved LEFT mid lung infiltrates since previous exam. No pneumothorax. IMPRESSION: Bibasilar atelectasis with improved LEFT mid lung infiltrate. Electronically Signed   By: Lavonia Dana M.D.   On: 04/30/2020 16:38   DG CHEST PORT 1 VIEW  Result Date: 04/29/2020 CLINICAL DATA:  Renal failure. EXAM: PORTABLE CHEST 1 VIEW COMPARISON:  April 28, 2020. FINDINGS: Stable cardiomediastinal silhouette. Endotracheal and feeding tubes are unchanged in position. Right internal jugular catheter is unchanged. No pneumothorax or pleural effusion is noted. New mild opacity is seen involving peripheral left midlung concerning for possible pneumonia. Mild right basilar subsegmental atelectasis is noted. Bony thorax is unremarkable. IMPRESSION: Stable support apparatus. New mild opacity is seen involving peripheral left midlung concerning for possible pneumonia. Electronically Signed   By: Marijo Conception M.D.   On: 04/29/2020 14:32   DG Abd Portable 1V  Result Date: 04/29/2020 CLINICAL DATA:  Nasogastric tube placement. EXAM: PORTABLE ABDOMEN - 1 VIEW COMPARISON:  April 26, 2020. FINDINGS: The bowel gas pattern is normal. Feeding tube tip is seen in expected position of distal stomach. No radio-opaque calculi or other significant radiographic abnormality are seen. IMPRESSION: Feeding  tube tip seen in expected position of distal stomach. No evidence of bowel obstruction or ileus. Electronically Signed   By: Marijo Conception M.D.   On: 04/29/2020 14:29   ECHO TEE  Result Date: 04/29/2020    TRANSESOPHOGEAL ECHO REPORT   Patient Name:   Robert Daniel Date of Exam: 04/29/2020 Medical Rec #:  [id number]       Height:       75.0 in Accession #:    [id number]      Weight:       317.5 lb Date of Birth:  February 17, 1941       BSA:          2.671 m Patient Age:    50 years        BP:           125/50 mmHg Patient Gender: M               HR:           116 bpm. Exam Location:  Inpatient Procedure: Transesophageal Echo, Cardiac Doppler and Color Doppler Indications:     Stroke 434.91 / I63.9  History:         Patient has prior history of Echocardiogram examinations, most                  recent 04/27/2020. Arrythmias:Atrial Fibrillation,                  Signs/Symptoms:Shortness of Breath; Risk Factors:Hypertension,                  Diabetes and Dyslipidemia.  Sonographer:     Darlina Sicilian RDCS Referring Phys:  [id number] Lorenza Chick Diagnosing Phys: Cherlynn Kaiser MD PROCEDURE: After discussion of the risks and benefits of a TEE, an informed consent was obtained from a family member. The patient was intubated. TEE procedure time was 17 minutes. The transesophogeal probe was passed without difficulty through the esophogus of the patient. Imaged were obtained with the patient in a supine position. Local oropharyngeal anesthetic was provided with Cetacaine. Sedation performed by different physician. The patient was monitored while under deep sedation. Anesthestetic sedation was provided intravenously by Anesthesiology: 30mg  of Propofol. Image quality was good. The patient's vital signs; including heart rate, blood pressure, and oxygen saturation; remained stable throughout the procedure. The patient developed no complications during the procedure. IMPRESSIONS  1. 1.8 x 0.9 cm mobile echodensity on the atrial  aspect of the tricuspid valve. Attachment point is most likely anterior tricuspid valve leaflet. 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 The tricuspid valve is abnormal. Tricuspid valve regurgitation is mild to moderate.  2. Left ventricular ejection fraction, by estimation, is 45 to 50%. The left ventricle has mildly decreased function. There is moderate left ventricular hypertrophy.  3. Right ventricular systolic function is mildly reduced. The right ventricular size is mildly enlarged.  4. Left atrial size was moderately dilated. No left atrial/left atrial appendage thrombus was detected. The LAA emptying velocity was 72 cm/s.  5. Right atrial size was mild to moderately dilated.  6. The mitral valve is degenerative. Mild mitral valve regurgitation.  7. The aortic valve is abnormal. There is mild calcification of the aortic valve. Aortic valve regurgitation is trivial. No aortic stenosis is present.  8. Aortic dilatation noted. There is borderline dilatation of the aortic root and of the ascending aorta, measuring 40 mm. There is Severe (Grade IV) atheroma plaque involving the transverse and descending aorta.  9. Agitated saline contrast bubble study was negative, with no evidence of any interatrial shunt. FINDINGS  Left Ventricle: Left ventricular ejection fraction, by estimation, is 45 to 50%. The left ventricle has mildly decreased function. The left ventricular internal cavity size was normal in size. There is moderate left ventricular hypertrophy. Right Ventricle: The right ventricular size is mildly enlarged. Right vetricular wall thickness was not well visualized. Right ventricular systolic function is mildly reduced. Left Atrium: Left atrial size was moderately dilated. No left atrial/left atrial appendage thrombus was detected. The LAA emptying velocity was 72 cm/s. Right Atrium: Right atrial size was mild to moderately dilated. Pericardium: There is no evidence of  pericardial effusion. Mitral Valve: The mitral valve is degenerative in appearance. Mild mitral valve regurgitation. Tricuspid Valve: 1.8 x 0.9 cm mobile echodensity on the atrial aspect of the tricuspid valve. Attachment point is most likely anterior tricuspid valve leaflet. 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 The tricuspid valve is abnormal. Tricuspid valve regurgitation is mild to moderate. Aortic Valve: The aortic valve is abnormal. There is mild calcification of the aortic valve.  Aortic valve regurgitation is trivial. No aortic stenosis is present. Aortic valve mean gradient measures 4.0 mmHg. Aortic valve peak gradient measures 7.2 mmHg.  Aortic valve area, by VTI measures 3.02 cm. Pulmonic Valve: The pulmonic valve was grossly normal. Pulmonic valve regurgitation is trivial. Aorta: Aortic dilatation noted. There is borderline dilatation of the aortic root and of the ascending aorta, measuring 40 mm. There is severe (Grade IV) atheroma plaque involving the transverse and descending aorta. IAS/Shunts: No atrial level shunt detected by color flow Doppler. Agitated saline contrast was given intravenously to evaluate for intracardiac shunting. Agitated saline contrast bubble study was negative, with no evidence of any interatrial shunt.  LEFT VENTRICLE PLAX 2D LVOT diam:     2.10 cm LV SV:         56 LV SV Index:   21 LVOT Area:     3.46 cm  AORTIC VALVE AV Area (Vmax):    2.84 cm AV Area (Vmean):   2.71 cm AV Area (VTI):     3.02 cm AV Vmax:           134.00 cm/s AV Vmean:          94.700 cm/s AV VTI:            0.186 m AV Peak Grad:      7.2 mmHg AV Mean Grad:      4.0 mmHg LVOT Vmax:         110.00 cm/s LVOT Vmean:        74.100 cm/s LVOT VTI:          0.162 m LVOT/AV VTI ratio: 0.87  AORTA Ao Root diam: 4.00 cm Ao Asc diam:  4.00 cm TRICUSPID VALVE TR Peak grad:   30.9 mmHg TR Vmax:        278.00 cm/s  SHUNTS Systemic VTI:  0.16 m Systemic Diam: 2.10 cm Cherlynn Kaiser MD Electronically signed by Cherlynn Kaiser MD Signature Date/Time: 04/29/2020/9:50:53 PM    Final      Assessment/Plan: 80yo M with disseminated MSSA infection ( bacteremia, uti, TV endocarditis) suspect he also had left sided endocarditis to explain CNS septic emboli = plan to treat for 6 wk with nafcillin.   Enterobacter/citrobacter koseri on trach culture = unclear if this is colonization or pathogen. CXR appears improving as dose vent setting. If pulmonary feels more consistent with pneumonia, recommend to switch to cefepime x 5-7 days, then resume back to nafcillin  Acute on ckd, now requiring HD = continue with HD to help with fluid status (still significant fluid overload). Continue to renally dose abtx  Will sign off. Please call back if needed, will check in periodically to decide follow up   [facility] for Infectious Diseases Cell: [phone] Pager: [phone]  05/01/2020, 1:10 PM

## 2020-05-01 NOTE — Progress Notes (Signed)
[facility] Progress Note Patient Name: Robert Daniel DOB: Apr 13, 1940 MRN: [id number]   Date of Service  05/01/2020  HPI/Events of Note  ABG on 50%/PRVC 22/TV 630/P 10 = 7.213/47.2/187.   eICU Interventions  Plan: 1. Increase PRVC rate to 30. 2. Repeat ABG at 11 PM.      Intervention Category Major Interventions: Acid-Base disturbance - evaluation and management;Respiratory failure - evaluation and management  Lysle Dingwall 05/01/2020, 9:31 PM

## 2020-05-01 NOTE — Progress Notes (Signed)
eLink Physician-Brief Progress Note Patient Name: Robert Daniel DOB: [birth year] MRN: [id number]   Date of Service  05/01/2020  HPI/Events of Note  Hypotension - BP = 98/77 on Norepinephrine IV infusion at 40 mcg/min.   eICU Interventions  Plan: 1. Increase ceiling on Norepinephrine IV infusion to 60 mcg/min.  2. Monitor CVP now and Q 4 hours.  3. ABG STAT.     Intervention Category Major Interventions: Hypotension - evaluation and management  [name] 05/01/2020, 9:06 PM

## 2020-05-01 NOTE — Progress Notes (Signed)
[facility] Progress Note Patient Name: Robert Daniel DOB: 07/06/1940 MRN: [id number]   Date of Service  05/01/2020  HPI/Events of Note  Hypotension - BP = CVP = 12 and Albumin = 1.6. Already on a Norepinephrine IV infusion.   eICU Interventions  Plan: 1. Add Vasopressin IV infusion at shock dose. 2. 25% Albumin 25 gm IV now.      Intervention Category Intermediate Interventions: Hypotension - evaluation and management  Lysle Dingwall 05/01/2020, 9:48 PM

## 2020-05-01 NOTE — Progress Notes (Addendum)
STROKE TEAM PROGRESS NOTE   SUBJECTIVE (INTERVAL HISTORY) Pt is evaluated at the bedside.  Pt is critically ill. On Neuro Exam, Pt is still intubated, doesn't follows commands however, Pt able to wiggle toes on request. Doesn't open eyes to noxious stimuli. Pt still has fluid overload. BP stable. Afebrile now, Temp 96.4. PR - 91-114/min. Nephro on board. Na 136, K 5.4, P high at 7.2, creatinine downtrended to 3.14, WBC's still high at 14.3. Pt is on ASA 81 mg and brilinta 90 mg due to stenting.  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 ID recommended to continue Nafcillin for 6 weeks. Trach culture shows Enterobacter resistant to cefazoline. Central line placed yesterday. CCM following. ID consulted, signed off now.    OBJECTIVE  Temp:  [96.4 F (35.8 C)-98.6 F (37 C)] 96.4 F (35.8 C) (02/25 0800) Pulse Rate:  [91-114] 101 (02/25 1108) Cardiac Rhythm: Atrial fibrillation (02/25 0800) Resp:  [15-31] 21 (02/25 1300) BP: (102-124)/(44-49) 124/49 (02/25 1108) SpO2:  [89 %-100 %] 99 % (02/25 1108) Arterial Line BP: (86-147)/(44-69) 122/49 (02/25 1300) FiO2 (%):  [40 %-50 %] 40 % (02/25 1108) Weight:  [141.2 kg] 141.2 kg (02/25 0500)  Recent Labs  Lab 04/30/20 1924 04/30/20 2314 05/01/20 0332 05/01/20 0726 05/01/20 1122  GLUCAP 128* 151* 173* 146* 117*   Recent Labs  Lab 04/27/20 0444 04/27/20 1632 04/28/20 0500 04/28/20 1108 04/28/20 1521 04/28/20 2100 04/29/20 0604 04/30/20 0042 04/30/20 1700 05/01/20 0512  NA 140  --  141  --   --   --  144 141 139 136  K 5.4*  --  6.1*   < > 5.8*  --  5.0 4.5 4.9 5.4*  CL 108  --  109  --   --   --  108 103 104 101  CO2 16*  --  15*  --   --   --  19* 22 20* 21*  GLUCOSE 126*  --  274*  --   --   --  73 104* 112* 175*  BUN 101*  --  132*  --   --   --  127* 103* 102* 83*  CREATININE 3.93*  --  4.38*  --   --   --  4.46* 3.99* 3.83* 3.14*  CALCIUM 8.0*  --  7.8*  --   --   --  7.3* 7.3* 6.9* 7.0*  MG 2.1  2.3 2.3  --   --  2.2  --   --   --  2.5*  PHOS 8.2* 8.4* 9.3*  --   --   --   --   --  7.8* 7.2*   < > = values in this interval not displayed.   Recent Labs  Lab 04/21/2020 1417 04/30/20 1700 05/01/20 0512  AST 37  --   --   ALT 28  --   --   ALKPHOS 66  --   --   BILITOT 0.7  --   --   PROT 6.1*  --   --   ALBUMIN 2.4* 1.7* 1.6*   Recent Labs  Lab 04/18/2020 1417 04/29/2020 1609 04/27/20 0444 04/28/20 0500 04/29/20 0604 04/30/20 0042 05/01/20 0848  WBC 16.8*   < > 12.2* 15.2* 16.4* 14.7* 14.3*  NEUTROABS 15.3*  --   --   --   --   --   --   HGB 8.1*   < > 8.6* 8.6* 8.3* 9.0* 8.8*  HCT 25.0*   < >  27.7* 28.0* 27.1* 28.3* 27.4*  MCV 88.0   < > 89.6 91.2 89.4 87.1 87.5  PLT 251   < > 217 191 146* 142* 127*   < > = values in this interval not displayed.   Recent Labs  Lab 04/28/20 1721  CKTOTAL 124   No results for input(s): LABPROT, INR in the last 72 hours. No results for input(s): COLORURINE, LABSPEC, [place], GLUCOSEU, HGBUR, BILIRUBINUR, KETONESUR, PROTEINUR, UROBILINOGEN, NITRITE, LEUKOCYTESUR in the last 72 hours.  Invalid input(s): APPERANCEUR     Component Value Date/Time   CHOL 57 04/26/2020 0423   TRIG 96 04/29/2020 0604   HDL <10 (L) 04/26/2020 0423   CHOLHDL NOT CALCULATED 04/26/2020 0423   VLDL 36 04/26/2020 0423   LDLCALC NOT CALCULATED 04/26/2020 0423   LDLCALC 121 (H) 07/16/2019 0751   Lab Results  Component Value Date   HGBA1C 9.8 (H) 04/26/2020   No results found for: LABOPIA, COCAINSCRNUR, [place], [place], [facility], [place]  Recent Labs  Lab 05/01/2020 [place] <10    I have personally reviewed the radiological images below and agree with the radiology interpretations.  EEG  Result Date: 04/29/2020 Lora Havens, MD     04/29/2020  5:02 PM Patient Name: Drury Ardizzone MRN: [id number] Epilepsy Attending: Lora Havens Referring Physician/Provider: Dr Lesleigh Noe Date: 04/29/2020 Duration: 32.42 mins Patient history: 80 year old male  with worsening mental status.  EEG to evaluate for seizures. Level of alertness:  comatose AEDs during EEG study: None Technical aspects: This EEG study was done with scalp electrodes positioned according to the 10-20 International system of electrode placement. Electrical activity was acquired at a sampling rate of '500Hz'  and reviewed with a high frequency filter of '70Hz'  and a low frequency filter of '1Hz' . EEG data were recorded continuously and digitally stored. Description: EEG showed continuous 5 to 6 Hz theta slowing in left hemisphere as well as 2-'3Hz'  low amplitude delta slowing in right hemisphere.  Hyperventilation and photic stimulation were not performed.   ABNORMALITY -Continuous slow, generalized and lateralized right hemisphere IMPRESSION: This study is suggestive of cortical dysfunction in right hemisphere due to underlying structural abnormality as well as moderate to severe diffuse encephalopathy, nonspecific etiology. No seizures or epileptiform discharges were seen throughout the recording. Lora Havens   DG Chest 1 View  Result Date: 05/03/2020 CLINICAL DATA:  Acute shortness of breath.  CVA. EXAM: CHEST  1 VIEW COMPARISON:  07/08/2019 chest radiograph FINDINGS: This is a low volume study. The cardiomediastinal silhouette is unchanged. Mild pulmonary vascular congestion noted. There is no evidence of focal airspace disease, pulmonary edema, suspicious pulmonary nodule/mass, pleural effusion, or pneumothorax. No acute bony abnormalities are identified. IMPRESSION: Mild pulmonary vascular congestion. Electronically Signed   By: Margarette Canada M.D.   On: 04/12/2020 15:02   CT HEAD WO CONTRAST  Result Date: 04/28/2020 CLINICAL DATA:  Altered mental status. EXAM: CT HEAD WITHOUT CONTRAST TECHNIQUE: Contiguous axial images were obtained from the base of the skull through the vertex without intravenous contrast. COMPARISON:  MRI April 26, 2020.  CT exams April 25, 2020. FINDINGS: Brain:  Scattered cortical and subcortical areas of hypoattenuation within the right MCA distribution, compatible with evolving infarcts. No progressive mass effect or midline shift. Subtle cortical versus sulcal hyperdensity along the high right posterior frontal convexity may represent petechial hemorrhage versus trace subarachnoid hemorrhage. No mass occupying hemorrhagic transformation. No mass lesion. Additional mild patchy white matter hypoattenuation, likely related to chronic microvascular ischemic disease. Vascular:  Right M2 MCA stent.  Calcific atherosclerosis. Skull: No acute fracture. Sinuses/Orbits: Scattered paranasal sinus mucosal thickening. Other: No mastoid effusions. IMPRESSION: 1. Scattered cortical and subcortical areas of hypoattenuation within the right MCA distribution, compatible with evolving infarcts. No progressive mass effect. If there is concern for expansion of known infarcts, an MRI could better evaluate. 2. Subtle cortical versus sulcal hyperdensity along the high right posterior frontal convexity may represent petechial hemorrhage versus trace subarachnoid hemorrhage. No mass occupying hemorrhagic transformation. 3. Paranasal sinus mucosal thickening. These results will be called to the ordering clinician or representative by the Radiologist Assistant, and communication documented in the PACS or [employer]. Electronically Signed   By: Margaretha Sheffield MD   On: 04/28/2020 21:24   MR ANGIO HEAD WO CONTRAST  Result Date: 04/29/2020 CLINICAL DATA:  80 year old male with altered mental status. Scattered infarcts in the right hemisphere, occasionally in the left hemisphere on 04/26/2020. History of right MCA stent. Questionable petechial or subarachnoid hemorrhage along the right superior convexity on head CT yesterday. EXAM: MRI HEAD WITHOUT CONTRAST MRA HEAD WITHOUT CONTRAST TECHNIQUE: Multiplanar, multiecho pulse sequences of the brain and surrounding structures were obtained  without intravenous contrast. Angiographic images of the head were obtained using MRA technique without contrast. COMPARISON:  Brain MRI and MRA 04/26/2020.  Head CT yesterday. FINDINGS: MRI HEAD FINDINGS Brain: Increased size and confluence of multifocal restricted diffusion, now moderately affecting the right hemisphere in both right MCA and right PCA vascular territories. Confluent right corona radiata and frontal operculum progression (series 2, image 34). Similar increased conspicuity of small foci along the right internal capsule. More extensive cortical involvement elsewhere including at the left occipital pole, junction of the right temporal and occipital lobes. A few small scattered areas of cortical restricted diffusion in the left superior and posterior frontal lobe are also mildly increased (e.g. Series 2, image 40). There is also a small round new focus of restricted diffusion in the central midbrain to the right (series 2, image 21). And furthermore increased conspicuity of a similar small focus of restriction in the left cerebellar vermis (image 12). No other new areas of involvement identified. Progressed cytotoxic edema associated with the above. Mild new mass effect on the right lateral ventricle. No midline shift. Basilar cisterns remain patent. There is punctate but increased petechial or subarachnoid hemorrhage along the right central sulcus as suspected by CT (series 8, image 70 on SWI). Stable several chronic microhemorrhages in the opposite left frontal lobe. Susceptibility artifact related to posterior right MCA branch stent. No intraventricular hemorrhage or ventriculomegaly. Cervicomedullary junction and pituitary are within normal limits. Vascular: Major intracranial vascular flow voids remain stable. Skull and upper cervical spine: Stable, negative for age. Sinuses/Orbits: Stable and negative orbits. Fluid in the nasopharynx with right nasoenteric tube in place now. Stable fluid layering  in the sphenoid sinuses. Mild paranasal sinus mucosal thickening elsewhere. New left greater than right mastoid fluid. Other: None. MRA HEAD FINDINGS Source images demonstrate stable antegrade flow signal compared to 04/26/20 throughout the anterior and posterior circulation. Diminutive vertebrobasilar system, somewhat dominant left vertebral artery. Less of the V4 segment is visible today. Distal vertebral and basilar artery irregularity but only mild associated stenosis. Basilar functionally terminates in the SCA is. Fetal type bilateral PCA origins and bilateral PCAs appears stable and within normal limits. Stable ICA siphons, mild tortuosity and irregularity without ICA stenosis. Ophthalmic and posterior communicating artery origins remain patent. Patent carotid termini. ACAs are stable and within normal limits,  left A1 mildly dominant. Normal anterior communicating artery. Left MCA M1 segment, left MCA bifurcation and visible left MCA branches are stable and within normal limits. Right MCA M1 remains patent. Patent right MCA bifurcation and proximal M2 branches. Right MCA M2 vascular stent susceptibility and visible right MCA branches appear stable. IMPRESSION: 1. Progressed size and extent of infarcts widely scattered in the right MCA and PCA territories since 04/26/2020. Associated petechial hemorrhage as suspected by CT. No malignant hemorrhagic transformation. Mild mass effect with no midline shift at this time. 2. Small new lacunar type infarcts in the left cerebellar vermis and right midbrain. Several small left MCA territory cortical infarcts again noted. 3. Stable intracranial MRA from 2 days ago. No large vessel occlusion. Stable MRA appearance of right MCA M2 stent. Electronically Signed   By: Genevie Ann M.D.   On: 04/29/2020 09:07   MR ANGIO HEAD WO CONTRAST  Result Date: 04/26/2020 CLINICAL DATA:  Stroke follow-up. EXAM: MRI HEAD WITHOUT CONTRAST MRA HEAD WITHOUT CONTRAST TECHNIQUE: Multiplanar,  multiecho pulse sequences of the brain and surrounding structures were obtained without intravenous contrast. Angiographic images of the head were obtained using MRA technique without contrast. COMPARISON:  CT a of the head neck from yesterday FINDINGS: MRI HEAD FINDINGS Brain: Patchy cortical infarction along the right MCA territory and reaching the right occipital cortex in this patient with large right posterior communicating artery. Two tiny cortical infarcts in the left MCA to distal ACA territory. No hemorrhage, mass effect, or hydrocephalus. Age normal brain volume. Mild pre-existing ischemic disease for age. Vascular: Arterial findings below. Skull and upper cervical spine: Normal marrow signal Sinuses/Orbits: Generalized mucosal thickening with fluid levels. MRA HEAD FINDINGS Right M2 stenting with expected associated artifact but visible internal flow on source images. On 3D reformats there is apparent high-grade stenosis with flow gap in the region of the M2 takeoff, but this is likely primarily from artifact based on source images. There is moderate atheromatous irregularity of bilateral MCA branches. Diffuse atheromatous irregularity of the left V4 segment and basilar with overall moderate stenosis except at the vertebrobasilar junction where narrowing appears high-grade. The right V4 segment is not seen until the patent PICA. IMPRESSION: 1. Scattered acute cortical infarcts in the right MCA territory and right occipital lobe. Two tiny cortical infarcts in the left cerebral hemisphere. 2. Present flow in the stented right M2 branch. 3. Advanced intracranial atherosclerosis as previously characterized. 4. Sinus inflammation. Electronically Signed   By: Monte Fantasia M.D.   On: 04/26/2020 04:34   MR BRAIN WO CONTRAST  Result Date: 04/29/2020 CLINICAL DATA:  80 year old male with altered mental status. Scattered infarcts in the right hemisphere, occasionally in the left hemisphere on 04/26/2020.  History of right MCA stent. Questionable petechial or subarachnoid hemorrhage along the right superior convexity on head CT yesterday. EXAM: MRI HEAD WITHOUT CONTRAST MRA HEAD WITHOUT CONTRAST TECHNIQUE: Multiplanar, multiecho pulse sequences of the brain and surrounding structures were obtained without intravenous contrast. Angiographic images of the head were obtained using MRA technique without contrast. COMPARISON:  Brain MRI and MRA 04/26/2020.  Head CT yesterday. FINDINGS: MRI HEAD FINDINGS Brain: Increased size and confluence of multifocal restricted diffusion, now moderately affecting the right hemisphere in both right MCA and right PCA vascular territories. Confluent right corona radiata and frontal operculum progression (series 2, image 34). Similar increased conspicuity of small foci along the right internal capsule. More extensive cortical involvement elsewhere including at the left occipital pole, junction of the right  temporal and occipital lobes. A few small scattered areas of cortical restricted diffusion in the left superior and posterior frontal lobe are also mildly increased (e.g. Series 2, image 40). There is also a small round new focus of restricted diffusion in the central midbrain to the right (series 2, image 21). And furthermore increased conspicuity of a similar small focus of restriction in the left cerebellar vermis (image 12). No other new areas of involvement identified. Progressed cytotoxic edema associated with the above. Mild new mass effect on the right lateral ventricle. No midline shift. Basilar cisterns remain patent. There is punctate but increased petechial or subarachnoid hemorrhage along the right central sulcus as suspected by CT (series 8, image 70 on SWI). Stable several chronic microhemorrhages in the opposite left frontal lobe. Susceptibility artifact related to posterior right MCA branch stent. No intraventricular hemorrhage or ventriculomegaly. Cervicomedullary  junction and pituitary are within normal limits. Vascular: Major intracranial vascular flow voids remain stable. Skull and upper cervical spine: Stable, negative for age. Sinuses/Orbits: Stable and negative orbits. Fluid in the nasopharynx with right nasoenteric tube in place now. Stable fluid layering in the sphenoid sinuses. Mild paranasal sinus mucosal thickening elsewhere. New left greater than right mastoid fluid. Other: None. MRA HEAD FINDINGS Source images demonstrate stable antegrade flow signal compared to 04/26/20 throughout the anterior and posterior circulation. Diminutive vertebrobasilar system, somewhat dominant left vertebral artery. Less of the V4 segment is visible today. Distal vertebral and basilar artery irregularity but only mild associated stenosis. Basilar functionally terminates in the SCA is. Fetal type bilateral PCA origins and bilateral PCAs appears stable and within normal limits. Stable ICA siphons, mild tortuosity and irregularity without ICA stenosis. Ophthalmic and posterior communicating artery origins remain patent. Patent carotid termini. ACAs are stable and within normal limits, left A1 mildly dominant. Normal anterior communicating artery. Left MCA M1 segment, left MCA bifurcation and visible left MCA branches are stable and within normal limits. Right MCA M1 remains patent. Patent right MCA bifurcation and proximal M2 branches. Right MCA M2 vascular stent susceptibility and visible right MCA branches appear stable. IMPRESSION: 1. Progressed size and extent of infarcts widely scattered in the right MCA and PCA territories since 04/26/2020. Associated petechial hemorrhage as suspected by CT. No malignant hemorrhagic transformation. Mild mass effect with no midline shift at this time. 2. Small new lacunar type infarcts in the left cerebellar vermis and right midbrain. Several small left MCA territory cortical infarcts again noted. 3. Stable intracranial MRA from 2 days ago. No large  vessel occlusion. Stable MRA appearance of right MCA M2 stent. Electronically Signed   By: Genevie Ann M.D.   On: 04/29/2020 09:07   MR BRAIN WO CONTRAST  Result Date: 04/26/2020 CLINICAL DATA:  Stroke follow-up. EXAM: MRI HEAD WITHOUT CONTRAST MRA HEAD WITHOUT CONTRAST TECHNIQUE: Multiplanar, multiecho pulse sequences of the brain and surrounding structures were obtained without intravenous contrast. Angiographic images of the head were obtained using MRA technique without contrast. COMPARISON:  CT a of the head neck from yesterday FINDINGS: MRI HEAD FINDINGS Brain: Patchy cortical infarction along the right MCA territory and reaching the right occipital cortex in this patient with large right posterior communicating artery. Two tiny cortical infarcts in the left MCA to distal ACA territory. No hemorrhage, mass effect, or hydrocephalus. Age normal brain volume. Mild pre-existing ischemic disease for age. Vascular: Arterial findings below. Skull and upper cervical spine: Normal marrow signal Sinuses/Orbits: Generalized mucosal thickening with fluid levels. MRA HEAD FINDINGS Right M2 stenting  with expected associated artifact but visible internal flow on source images. On 3D reformats there is apparent high-grade stenosis with flow gap in the region of the M2 takeoff, but this is likely primarily from artifact based on source images. There is moderate atheromatous irregularity of bilateral MCA branches. Diffuse atheromatous irregularity of the left V4 segment and basilar with overall moderate stenosis except at the vertebrobasilar junction where narrowing appears high-grade. The right V4 segment is not seen until the patent PICA. IMPRESSION: 1. Scattered acute cortical infarcts in the right MCA territory and right occipital lobe. Two tiny cortical infarcts in the left cerebral hemisphere. 2. Present flow in the stented right M2 branch. 3. Advanced intracranial atherosclerosis as previously characterized. 4. Sinus  inflammation. Electronically Signed   By: Monte Fantasia M.D.   On: 04/26/2020 04:34   US RENAL  Result Date: 04/29/2020 CLINICAL DATA:  Acute kidney injury EXAM: RENAL / URINARY TRACT ULTRASOUND COMPLETE COMPARISON:  None. FINDINGS: Right Kidney: Renal measurements: 14.4 x 6 x 5.9 cm = volume: 265 mL. Cortical echogenicity normal. No hydronephrosis. Possible exophytic cyst off the midpole but poor visibility. Left Kidney: Renal measurements: 13.6 x 7.3 x 6.4 cm = volume: 332.5 mL. Echogenicity within normal limits. No hydronephrosis. Cyst at the lower pole measuring 6.3 cm. Bladder: Not well seen and presumably empty Other: None. IMPRESSION: 1. Negative for hydronephrosis. 2. Simple appearing left renal cyst Electronically Signed   By: Donavan Foil M.D.   On: 04/29/2020 00:11   IR Intra Cran Stent  Result Date: 04/27/2020 INDICATION: 80 year old male with past medical history significant for diabetes mellitus type 2, hypertension, hyperlipidemia and atrial fibrillation status post cardioversion in November 2021 on Xarelto. Patient had an episode of transient slurred speech and mild left arm weakness on 04/24/2020. Around noon on 04/24/2020, he developed significant left arm weakness, slurred speech and left facial droop. He was taken to outside hospital where he was found to have diminished opacification of the right MCA branches on CTA with area of increased T-max in the right MCA territory. He was taken to our service for a diagnostic cerebral angiogram and possible mechanical thrombectomy. EXAM: Ultrasound-guided vascular access Diagnostic cerebral angiogram Mechanical thrombectomy Intracranial stenting Flat panel head CT COMPARISON:  CT/CT angiogram of the head and neck April 25, 2020. MEDICATIONS: Refer to anesthesia documentation. ANESTHESIA/SEDATION: The procedure was performed under general anesthesia. CONTRAST:  75 mL of Omnipaque 240 milligrams/mL FLUOROSCOPY TIME:  Fluoroscopy Time: 113  minutes 6 seconds (2,379 mGy). COMPLICATIONS: None immediate. TECHNIQUE: Informed written consent was obtained from the patient's sister after a thorough discussion of the procedural risks, benefits and alternatives. All questions were addressed. Maximal Sterile Barrier Technique was utilized including caps, mask, sterile gowns, sterile gloves, sterile drape, hand hygiene and skin antiseptic. A timeout was performed prior to the initiation of the procedure. The right groin was prepped and draped in the usual sterile fashion. Using a micropuncture kit and the modified Seldinger technique, access was gained to the right common femoral artery and an 8 French sheath was placed. Real-time ultrasound guidance was utilized for vascular access including the acquisition of a permanent ultrasound image documenting patency of the accessed vessel. Under fluoroscopy, a 6 [place] Berenstein 2 catheter was navigated over a 0.035" Terumo Glidewire into the aortic arch. The catheter was placed into the right common carotid artery and then advanced into the right internal carotid artery. Frontal and lateral angiograms of the head were obtained. FINDINGS: 1. Near occlusion of a  proximal right M2/MCA middle division branch with minimal delayed contrast penetration of distal branches. 2. Atherosclerotic changes of the right carotid siphon without hemodynamically significant stenosis. 3. Atherosclerotic changes of the right common femoral artery without hemodynamically significant stenosis. PROCEDURE: Under biplane roadmap, the Berenstein 2 catheter was exchanged over the wire for a zoom 88 guide catheter. The catheter was placed in the upper cervical segment of the right ICA. A zoom 71 catheter was then navigated through the guide catheter into the petrous segment of the right ICA. Magnified frontal and lateral views of the head were obtained. Then a phenom 21 microcatheter was navigated over a synchro support microguidewire into the  right M1/MCA. Multiple attempts to gain access to the right M2 branch proved unsuccessful. The synchro support micro guidewire was then exchanged for a 0.016" headliner micro guidewire which was advanced into the distal right M2/MCA branch. The microcatheter was then advanced over the wire. The microwire was removed and a 4 x 40 solitaire thrombectomy device was deployed spanning the right M2/MCA middle division branch and distal M1 segment. The device was allowed to intercalated with the clot for 4 minutes. The aspiration catheter was advanced to the level of occlusion. The microcatheter was removed. The aspiration catheter and thrombectomy device were removed under constant aspiration. Follow-up right ICA angiograms with magnified frontal and lateral views of the head showed brisk opacification of the right M2/MCA middle division branch with irregularity noted at the branch origin [facility]). Delayed follow-up angiogram showed branch reocclusion. Under biplane roadmap, the zoom 71 aspiration catheter was again navigated over a phenom 21 microcatheter and a headliner microguidewire into the cavernous segment of the right ICA. The microcatheter was then navigated over the wire into the right M2/MCA middle division branch. Then, a 4 x 40 mm solitaire stent retriever was deployed spanning the M2 and distal M1 segments. The device was allowed to intercalated with the clot for 4 minutes. The microcatheter was removed. The aspiration catheter was advanced to the level of occlusion and connected to a penumbra aspiration pump. The thrombectomy device and aspiration catheter were removed under constant aspiration. Follow-up right ICA angiograms with magnified frontal and lateral views of the head showed brisk opacification of the right M2/MCA middle division branch with irregularity noted at the branch origin [facility]). Delayed follow-up angiogram showed branch reocclusion. Under biplane roadmap, the zoom 71 aspiration  catheter was again navigated over a phenom 21 microcatheter and a headliner microguidewire into the cavernous segment of the right ICA. The microcatheter was then navigated over the wire into the right M2/MCA middle division branch. Then, a 5 x 37 mm embotrap stent retriever was deployed spanning the M2 and distal M1 segments. The device was allowed to intercalated with the clot for 4 minutes. The microcatheter was removed. The aspiration catheter was advanced to the level of occlusion and connected to a penumbra aspiration pump. The thrombectomy device and aspiration catheter were removed under constant aspiration. Follow-up right ICA angiograms with magnified frontal and lateral views of the head showed recanalization of the right M2/MCA middle division branch with hairline stenosis at its origin due to atherosclerotic plaque and stenosis at the mid M2 segment with very delayed opacification of distal branches. Flat panel CT of the head was obtained and post processed in a separate workstation with concurrent attending physician supervision. Selected images were sent to PACS. No evidence of hemorrhagic complication noted. Under biplane roadmap, the zoom 71 aspiration catheter was again navigated over a phenom 21  microcatheter and a Aristotle 14 microguidewire into the cavernous segment of the right ICA. The microcatheter was then navigated over the wire into the right M2/MCA middle division branch. The Aristotle microguidewire was exchanged for a an exchange length synchro microguidewire. At this point, patient developed severe bradycardia and eventually asystole. The microwire and microcatheter were removed. The table was lowered to start chest compressions. However, patient had spontaneous return of circulation before the start of resuscitation efforts. Follow-up right ICA angiograms with magnified frontal and lateral views of the head again showed recanalization of the right M2/MCA middle division branch with  with hairline stenosis. No evidence of contrast extravasation. Under biplane roadmap, an SL 10 microcatheter was navigated over Aristotle 14 microguidewire into the right M2/MCA middle division branch. Subsequently, a 4.5 x 30 mm neuroform atlas stent was deployed spanning the distal right M1, proximal and mid M2/MCA middle division branch. Follow-up angiogram showed brisk flow through the entire right MCA territory. The catheter construct was subsequently withdrawn. A right common femoral angiogram was performed in right anterior oblique view. The puncture is at the common femoral artery level. Atherosclerotic plaques are noted without hemodynamically significant stenosis. The sheath was exchanged for an 8 [place] Angio-Seal which was utilized for access closure. Immediate hemostasis was achieved. IMPRESSION: Mechanical thrombectomy and intracranial stenting performed for treatment of a proximal right M2/MCA middle division branch near occlusion with superimposed atherosclerotic plaque. A total of 3 passes performed with stent retriever followed by intracranial stenting. Intracranial stenting performed after bolus infusion of cangrelor followed by low-dose protocol drip. PLAN: Patient remained intubated and transferred to ICU given hemodynamic instability. Continue cangrelor drip until transition to aspirin and Brilinta. Family updated by telephone immediately after the end of the procedure. Electronically Signed   By: Pedro Earls M.D.   On: 04/27/2020 10:36   IR CT Head Ltd  Result Date: 04/27/2020 INDICATION: 80 year old male with past medical history significant for diabetes mellitus type 2, hypertension, hyperlipidemia and atrial fibrillation status post cardioversion in November 2021 on Xarelto. Patient had an episode of transient slurred speech and mild left arm weakness on 04/24/2020. Around noon on 05/04/2020, he developed significant left arm weakness, slurred speech and left facial  droop. He was taken to outside hospital where he was found to have diminished opacification of the right MCA branches on CTA with area of increased T-max in the right MCA territory. He was taken to our service for a diagnostic cerebral angiogram and possible mechanical thrombectomy. EXAM: Ultrasound-guided vascular access Diagnostic cerebral angiogram Mechanical thrombectomy Intracranial stenting Flat panel head CT COMPARISON:  CT/CT angiogram of the head and neck April 25, 2020. MEDICATIONS: Refer to anesthesia documentation. ANESTHESIA/SEDATION: The procedure was performed under general anesthesia. CONTRAST:  75 mL of Omnipaque 240 milligrams/mL FLUOROSCOPY TIME:  Fluoroscopy Time: 113 minutes 6 seconds (2,379 mGy). COMPLICATIONS: None immediate. TECHNIQUE: Informed written consent was obtained from the patient's sister after a thorough discussion of the procedural risks, benefits and alternatives. All questions were addressed. Maximal Sterile Barrier Technique was utilized including caps, mask, sterile gowns, sterile gloves, sterile drape, hand hygiene and skin antiseptic. A timeout was performed prior to the initiation of the procedure. The right groin was prepped and draped in the usual sterile fashion. Using a micropuncture kit and the modified Seldinger technique, access was gained to the right common femoral artery and an 8 French sheath was placed. Real-time ultrasound guidance was utilized for vascular access including the acquisition of a permanent ultrasound image  documenting patency of the accessed vessel. Under fluoroscopy, a 6 [place] Berenstein 2 catheter was navigated over a 0.035" Terumo Glidewire into the aortic arch. The catheter was placed into the right common carotid artery and then advanced into the right internal carotid artery. Frontal and lateral angiograms of the head were obtained. FINDINGS: 1. Near occlusion of a proximal right M2/MCA middle division branch with minimal delayed  contrast penetration of distal branches. 2. Atherosclerotic changes of the right carotid siphon without hemodynamically significant stenosis. 3. Atherosclerotic changes of the right common femoral artery without hemodynamically significant stenosis. PROCEDURE: Under biplane roadmap, the Berenstein 2 catheter was exchanged over the wire for a zoom 88 guide catheter. The catheter was placed in the upper cervical segment of the right ICA. A zoom 71 catheter was then navigated through the guide catheter into the petrous segment of the right ICA. Magnified frontal and lateral views of the head were obtained. Then a phenom 21 microcatheter was navigated over a synchro support microguidewire into the right M1/MCA. Multiple attempts to gain access to the right M2 branch proved unsuccessful. The synchro support micro guidewire was then exchanged for a 0.016" headliner micro guidewire which was advanced into the distal right M2/MCA branch. The microcatheter was then advanced over the wire. The microwire was removed and a 4 x 40 solitaire thrombectomy device was deployed spanning the right M2/MCA middle division branch and distal M1 segment. The device was allowed to intercalated with the clot for 4 minutes. The aspiration catheter was advanced to the level of occlusion. The microcatheter was removed. The aspiration catheter and thrombectomy device were removed under constant aspiration. Follow-up right ICA angiograms with magnified frontal and lateral views of the head showed brisk opacification of the right M2/MCA middle division branch with irregularity noted at the branch origin [facility]). Delayed follow-up angiogram showed branch reocclusion. Under biplane roadmap, the zoom 71 aspiration catheter was again navigated over a phenom 21 microcatheter and a headliner microguidewire into the cavernous segment of the right ICA. The microcatheter was then navigated over the wire into the right M2/MCA middle division branch. Then,  a 4 x 40 mm solitaire stent retriever was deployed spanning the M2 and distal M1 segments. The device was allowed to intercalated with the clot for 4 minutes. The microcatheter was removed. The aspiration catheter was advanced to the level of occlusion and connected to a penumbra aspiration pump. The thrombectomy device and aspiration catheter were removed under constant aspiration. Follow-up right ICA angiograms with magnified frontal and lateral views of the head showed brisk opacification of the right M2/MCA middle division branch with irregularity noted at the branch origin [facility]). Delayed follow-up angiogram showed branch reocclusion. Under biplane roadmap, the zoom 71 aspiration catheter was again navigated over a phenom 21 microcatheter and a headliner microguidewire into the cavernous segment of the right ICA. The microcatheter was then navigated over the wire into the right M2/MCA middle division branch. Then, a 5 x 37 mm embotrap stent retriever was deployed spanning the M2 and distal M1 segments. The device was allowed to intercalated with the clot for 4 minutes. The microcatheter was removed. The aspiration catheter was advanced to the level of occlusion and connected to a penumbra aspiration pump. The thrombectomy device and aspiration catheter were removed under constant aspiration. Follow-up right ICA angiograms with magnified frontal and lateral views of the head showed recanalization of the right M2/MCA middle division branch with hairline stenosis at its origin due to atherosclerotic plaque and stenosis  at the mid M2 segment with very delayed opacification of distal branches. Flat panel CT of the head was obtained and post processed in a separate workstation with concurrent attending physician supervision. Selected images were sent to PACS. No evidence of hemorrhagic complication noted. Under biplane roadmap, the zoom 71 aspiration catheter was again navigated over a phenom 21 microcatheter and a  Aristotle 14 microguidewire into the cavernous segment of the right ICA. The microcatheter was then navigated over the wire into the right M2/MCA middle division branch. The Aristotle microguidewire was exchanged for a an exchange length synchro microguidewire. At this point, patient developed severe bradycardia and eventually asystole. The microwire and microcatheter were removed. The table was lowered to start chest compressions. However, patient had spontaneous return of circulation before the start of resuscitation efforts. Follow-up right ICA angiograms with magnified frontal and lateral views of the head again showed recanalization of the right M2/MCA middle division branch with with hairline stenosis. No evidence of contrast extravasation. Under biplane roadmap, an SL 10 microcatheter was navigated over Aristotle 14 microguidewire into the right M2/MCA middle division branch. Subsequently, a 4.5 x 30 mm neuroform atlas stent was deployed spanning the distal right M1, proximal and mid M2/MCA middle division branch. Follow-up angiogram showed brisk flow through the entire right MCA territory. The catheter construct was subsequently withdrawn. A right common femoral angiogram was performed in right anterior oblique view. The puncture is at the common femoral artery level. Atherosclerotic plaques are noted without hemodynamically significant stenosis. The sheath was exchanged for an 8 [place] Angio-Seal which was utilized for access closure. Immediate hemostasis was achieved. IMPRESSION: Mechanical thrombectomy and intracranial stenting performed for treatment of a proximal right M2/MCA middle division branch near occlusion with superimposed atherosclerotic plaque. A total of 3 passes performed with stent retriever followed by intracranial stenting. Intracranial stenting performed after bolus infusion of cangrelor followed by low-dose protocol drip. PLAN: Patient remained intubated and transferred to ICU given  hemodynamic instability. Continue cangrelor drip until transition to aspirin and Brilinta. Family updated by telephone immediately after the end of the procedure. Electronically Signed   By: Pedro Earls M.D.   On: 04/27/2020 10:36   IR US Guide Vasc Access Right  Result Date: 04/27/2020 INDICATION: 79 year old male with past medical history significant for diabetes mellitus type 2, hypertension, hyperlipidemia and atrial fibrillation status post cardioversion in November 2021 on Xarelto. Patient had an episode of transient slurred speech and mild left arm weakness on 04/24/2020. Around noon on 04/10/2020, he developed significant left arm weakness, slurred speech and left facial droop. He was taken to outside hospital where he was found to have diminished opacification of the right MCA branches on CTA with area of increased T-max in the right MCA territory. He was taken to our service for a diagnostic cerebral angiogram and possible mechanical thrombectomy. EXAM: Ultrasound-guided vascular access Diagnostic cerebral angiogram Mechanical thrombectomy Intracranial stenting Flat panel head CT COMPARISON:  CT/CT angiogram of the head and neck April 25, 2020. MEDICATIONS: Refer to anesthesia documentation. ANESTHESIA/SEDATION: The procedure was performed under general anesthesia. CONTRAST:  75 mL of Omnipaque 240 milligrams/mL FLUOROSCOPY TIME:  Fluoroscopy Time: 113 minutes 6 seconds (2,379 mGy). COMPLICATIONS: None immediate. TECHNIQUE: Informed written consent was obtained from the patient's sister after a thorough discussion of the procedural risks, benefits and alternatives. All questions were addressed. Maximal Sterile Barrier Technique was utilized including caps, mask, sterile gowns, sterile gloves, sterile drape, hand hygiene and skin antiseptic. A timeout was  performed prior to the initiation of the procedure. The right groin was prepped and draped in the usual sterile fashion. Using a  micropuncture kit and the modified Seldinger technique, access was gained to the right common femoral artery and an 8 French sheath was placed. Real-time ultrasound guidance was utilized for vascular access including the acquisition of a permanent ultrasound image documenting patency of the accessed vessel. Under fluoroscopy, a 6 [place] Berenstein 2 catheter was navigated over a 0.035" Terumo Glidewire into the aortic arch. The catheter was placed into the right common carotid artery and then advanced into the right internal carotid artery. Frontal and lateral angiograms of the head were obtained. FINDINGS: 1. Near occlusion of a proximal right M2/MCA middle division branch with minimal delayed contrast penetration of distal branches. 2. Atherosclerotic changes of the right carotid siphon without hemodynamically significant stenosis. 3. Atherosclerotic changes of the right common femoral artery without hemodynamically significant stenosis. PROCEDURE: Under biplane roadmap, the Berenstein 2 catheter was exchanged over the wire for a zoom 88 guide catheter. The catheter was placed in the upper cervical segment of the right ICA. A zoom 71 catheter was then navigated through the guide catheter into the petrous segment of the right ICA. Magnified frontal and lateral views of the head were obtained. Then a phenom 21 microcatheter was navigated over a synchro support microguidewire into the right M1/MCA. Multiple attempts to gain access to the right M2 branch proved unsuccessful. The synchro support micro guidewire was then exchanged for a 0.016" headliner micro guidewire which was advanced into the distal right M2/MCA branch. The microcatheter was then advanced over the wire. The microwire was removed and a 4 x 40 solitaire thrombectomy device was deployed spanning the right M2/MCA middle division branch and distal M1 segment. The device was allowed to intercalated with the clot for 4 minutes. The aspiration catheter was  advanced to the level of occlusion. The microcatheter was removed. The aspiration catheter and thrombectomy device were removed under constant aspiration. Follow-up right ICA angiograms with magnified frontal and lateral views of the head showed brisk opacification of the right M2/MCA middle division branch with irregularity noted at the branch origin [facility]). Delayed follow-up angiogram showed branch reocclusion. Under biplane roadmap, the zoom 71 aspiration catheter was again navigated over a phenom 21 microcatheter and a headliner microguidewire into the cavernous segment of the right ICA. The microcatheter was then navigated over the wire into the right M2/MCA middle division branch. Then, a 4 x 40 mm solitaire stent retriever was deployed spanning the M2 and distal M1 segments. The device was allowed to intercalated with the clot for 4 minutes. The microcatheter was removed. The aspiration catheter was advanced to the level of occlusion and connected to a penumbra aspiration pump. The thrombectomy device and aspiration catheter were removed under constant aspiration. Follow-up right ICA angiograms with magnified frontal and lateral views of the head showed brisk opacification of the right M2/MCA middle division branch with irregularity noted at the branch origin [facility]). Delayed follow-up angiogram showed branch reocclusion. Under biplane roadmap, the zoom 71 aspiration catheter was again navigated over a phenom 21 microcatheter and a headliner microguidewire into the cavernous segment of the right ICA. The microcatheter was then navigated over the wire into the right M2/MCA middle division branch. Then, a 5 x 37 mm embotrap stent retriever was deployed spanning the M2 and distal M1 segments. The device was allowed to intercalated with the clot for 4 minutes. The microcatheter was removed. The  aspiration catheter was advanced to the level of occlusion and connected to a penumbra aspiration pump. The  thrombectomy device and aspiration catheter were removed under constant aspiration. Follow-up right ICA angiograms with magnified frontal and lateral views of the head showed recanalization of the right M2/MCA middle division branch with hairline stenosis at its origin due to atherosclerotic plaque and stenosis at the mid M2 segment with very delayed opacification of distal branches. Flat panel CT of the head was obtained and post processed in a separate workstation with concurrent attending physician supervision. Selected images were sent to PACS. No evidence of hemorrhagic complication noted. Under biplane roadmap, the zoom 71 aspiration catheter was again navigated over a phenom 21 microcatheter and a Aristotle 14 microguidewire into the cavernous segment of the right ICA. The microcatheter was then navigated over the wire into the right M2/MCA middle division branch. The Aristotle microguidewire was exchanged for a an exchange length synchro microguidewire. At this point, patient developed severe bradycardia and eventually asystole. The microwire and microcatheter were removed. The table was lowered to start chest compressions. However, patient had spontaneous return of circulation before the start of resuscitation efforts. Follow-up right ICA angiograms with magnified frontal and lateral views of the head again showed recanalization of the right M2/MCA middle division branch with with hairline stenosis. No evidence of contrast extravasation. Under biplane roadmap, an SL 10 microcatheter was navigated over Aristotle 14 microguidewire into the right M2/MCA middle division branch. Subsequently, a 4.5 x 30 mm neuroform atlas stent was deployed spanning the distal right M1, proximal and mid M2/MCA middle division branch. Follow-up angiogram showed brisk flow through the entire right MCA territory. The catheter construct was subsequently withdrawn. A right common femoral angiogram was performed in right anterior  oblique view. The puncture is at the common femoral artery level. Atherosclerotic plaques are noted without hemodynamically significant stenosis. The sheath was exchanged for an 8 [place] Angio-Seal which was utilized for access closure. Immediate hemostasis was achieved. IMPRESSION: Mechanical thrombectomy and intracranial stenting performed for treatment of a proximal right M2/MCA middle division branch near occlusion with superimposed atherosclerotic plaque. A total of 3 passes performed with stent retriever followed by intracranial stenting. Intracranial stenting performed after bolus infusion of cangrelor followed by low-dose protocol drip. PLAN: Patient remained intubated and transferred to ICU given hemodynamic instability. Continue cangrelor drip until transition to aspirin and Brilinta. Family updated by telephone immediately after the end of the procedure. Electronically Signed   By: Pedro Earls M.D.   On: 04/27/2020 10:36   CT CEREBRAL PERFUSION W CONTRAST  Result Date: 04/17/2020 CLINICAL DATA:  Slurred speech and left-sided weakness. EXAM: CT ANGIOGRAPHY HEAD AND NECK CT PERFUSION BRAIN TECHNIQUE: Multidetector CT imaging of the head and neck was performed using the standard protocol during bolus administration of intravenous contrast. Multiplanar CT image reconstructions and MIPs were obtained to evaluate the vascular anatomy. Carotid stenosis measurements (when applicable) are obtained utilizing NASCET criteria, using the distal internal carotid diameter as the denominator. Multiphase CT imaging of the brain was performed following IV bolus contrast injection. Subsequent parametric perfusion maps were calculated using RAPID software. CONTRAST:  22m OMNIPAQUE IOHEXOL 350 MG/ML SOLN COMPARISON:  None. FINDINGS: CT HEAD FINDINGS Brain: There is no evidence of an acute infarct, intracranial hemorrhage, mass, midline shift, or extra-axial fluid collection. Mild cerebral atrophy is  within normal limits for age. Patchy hypodensities in the cerebral white matter bilaterally are nonspecific but compatible with mild chronic small vessel ischemic  disease. There is a chronic lacunar infarct at the inferior aspect of the right caudate head. Vascular: Calcified atherosclerosis at the skull base. No hyperdense vessel. Skull: No fracture or suspicious osseous lesion. Sinuses/Orbits: Mild scattered mucosal thickening in the paranasal sinuses. Clear mastoid air cells. Bilateral cataract extraction. Other: None. ASPECTS ([place] Stroke Program Early CT Score) - Ganglionic level infarction (caudate, lentiform nuclei, internal capsule, insula, M1-M3 cortex): 7 - Supraganglionic infarction (M4-M6 cortex): 3 Total score (0-10 with 10 being normal): 10 Review of the MIP images confirms the above findings CTA NECK FINDINGS Aortic arch: Normal variant aortic arch branching pattern with common origin of the brachiocephalic and left common carotid arteries. Mild calcified plaque in the aortic arch without evidence of a significant arch vessel origin stenosis. Right carotid system: Patent with mild scattered predominantly calcified plaque throughout the common carotid artery and at the carotid bifurcation. No evidence of a significant stenosis or dissection. Left carotid system: Patent with mild plaque in the mid common carotid artery not resulting in significant stenosis. More extensive, heavily calcified plaque at the carotid bifurcation results in less than 50% proximal ICA stenosis. No evidence of dissection. Vertebral arteries: There is either severe stenosis or short segment occlusion of the proximal left vertebral artery at its origin with the vessel being patent throughout the remainder of the neck. There is a diffusely thready appearance of the right vertebral artery V1 and V2 segments with the right V3 segment being occluded. Skeleton: Advanced cervical disc and facet degeneration. Other neck: No evidence  of cervical lymphadenopathy or mass. Upper chest: Partially visualized small right pleural effusion. No apical lung consolidation. Review of the MIP images confirms the above findings CTA HEAD FINDINGS Anterior circulation: The internal carotid arteries are patent from skull base to carotid termini with calcified plaque resulting in mild cavernous and proximal supraclinoid stenosis bilaterally. The right M1 segment is patent with a mild stenosis proximally. There is a decreased number of distal right MCA branch vessels compared to the left, however a discrete proximal branch occlusion is not identified. The left MCA is patent with branch vessel irregularity as well as a mild-to-moderate proximal M2 stenosis. Both ACAs are patent without evidence of a significant proximal stenosis. No aneurysm is identified. Posterior circulation: The intracranial left vertebral artery is patent but diffusely irregular and narrowed with multiple moderate to severe stenoses. The right V4 segment is occluded proximally with likely retrograde filling distally supplying the right PICA. 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 Patent SCAs are seen bilaterally. There is a fetal origin of the PCAs without evidence of a significant proximal PCA stenosis. No aneurysm is identified. Venous sinuses: Patent. Anatomic variants: Fetal origin of the PCAs. Review of the MIP images confirms the above findings CT Brain Perfusion Findings: ASPECTS: 10 CBF (<30%) Volume: 0 mL Perfusion (Tmax>6.0s) volume: 55 mL Mismatch Volume: 55 mL Infarction Location: No core infarct evident by CTP. Ischemic penumbra in the right MCA territory, greatest in the frontal lobe at and superior to the operculum though with parietal and temporal lobe involvement as well. IMPRESSION: CT HEAD: 1. No evidence of acute intracranial abnormality.  ASPECTS of 10.  2. Mild chronic small vessel ischemic disease. CTA HEAD AND NECK: 1. Decreased number of right MCA branch vessels without a discrete proximal occlusion identified. It is possible that this reflects a stump occlusion of a proximal to mid M2 branch which  is not readily apparent by CTA. 2. Intracranial atherosclerosis with multiple anterior and posterior circulation stenoses as detailed above. 3. 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 4. Severe stenosis or short segment occlusion of the left vertebral artery at its origin with patency of the vessel more distally. Heavily diseased left V4 segment. 5. Cervical carotid atherosclerosis without significant stenosis. 6.  Aortic Atherosclerosis (ICD10-I70.0). CT BRAIN PERFUSION: 55 mL of ischemic penumbra in the right MCA territory without evidence of a core infarct. These results were called by telephone at the time of interpretation on 04/24/2020 at 2:15 pm to Dr. Donnetta Simpers, who verbally acknowledged these results. Electronically Signed   By: Logan Bores M.D.   On: 04/07/2020 14:58   DG CHEST PORT 1 VIEW  Result Date: 04/30/2020 CLINICAL DATA:  Central line placement EXAM: PORTABLE CHEST 1 VIEW COMPARISON:  Portable exam 1610 hours compared to 04/29/2020 FINDINGS: RIGHT jugular central venous catheter with tip projecting over SVC. Feeding tube extends into stomach. Tip of endotracheal tube projects 6.6 cm above carina. Normal heart size mediastinal contours. Atherosclerotic calcification aorta. Bibasilar atelectasis versus infiltrate. Improved LEFT mid lung infiltrates since previous exam. No pneumothorax. IMPRESSION: Bibasilar atelectasis with improved LEFT mid lung infiltrate. Electronically Signed   By: Lavonia Dana M.D.   On: 04/30/2020 16:38   DG CHEST PORT 1 VIEW  Result Date: 04/29/2020 CLINICAL DATA:  Renal failure. EXAM: PORTABLE CHEST 1 VIEW COMPARISON:  April 28, 2020. FINDINGS: Stable cardiomediastinal silhouette. Endotracheal and  feeding tubes are unchanged in position. Right internal jugular catheter is unchanged. No pneumothorax or pleural effusion is noted. New mild opacity is seen involving peripheral left midlung concerning for possible pneumonia. Mild right basilar subsegmental atelectasis is noted. Bony thorax is unremarkable. IMPRESSION: Stable support apparatus. New mild opacity is seen involving peripheral left midlung concerning for possible pneumonia. Electronically Signed   By: Marijo Conception M.D.   On: 04/29/2020 14:32   DG CHEST PORT 1 VIEW  Result Date: 04/28/2020 CLINICAL DATA:  Assess positioning of hemodialysis catheter. EXAM: PORTABLE CHEST 1 VIEW COMPARISON:  Same day chest radiograph at 0900 hours. FINDINGS: Right IJ central venous catheter with tip overlying the superior vena cava. Endotracheal tube with tip overlying the distal thoracic trachea. Enteric tube coursing below the diaphragm with tip obscured by collimation. The heart size and mediastinal contours are unchanged. Aortic atherosclerosis. Similar bilateral hilar airspace opacities. No significant pleural effusion or visible pneumothorax. The visualized skeletal structures are unchanged. IMPRESSION: 1. Right IJ since venous catheter with tip overlying the superior vena cava. No visible pneumothorax. Electronically Signed   By: Dahlia Bailiff MD   On: 04/28/2020 12:27   DG CHEST PORT 1 VIEW  Result Date: 04/28/2020 CLINICAL DATA:  Hypoxia EXAM: PORTABLE CHEST 1 VIEW COMPARISON:  April 26, 2020 FINDINGS: Endotracheal tube tip is 2.2 cm above the carina. Enteric tube tip is below the diaphragm. No pneumothorax. There is airspace opacity overlying the right hilum, a change from 2 days prior. There is bibasilar atelectasis. Heart size and pulmonary vascularity are normal. No adenopathy. No bone lesions. There is aortic atherosclerosis. IMPRESSION: Tube positions as described without pneumothorax. Suspect pneumonia right perihilar region given new  opacity in this area compared to 2 days prior. Bibasilar atelectasis. Stable cardiac silhouette. Aortic Atherosclerosis (ICD10-I70.0). Electronically Signed   By: Lowella Grip III M.D.   On: 04/28/2020 09:47   DG CHEST PORT 1 VIEW  Result Date: 04/26/2020 CLINICAL DATA:  Hypoxia EXAM: PORTABLE  CHEST 1 VIEW COMPARISON:  04/30/2020 FINDINGS: The endotracheal tube terminates above the carina. There is elevation of the right hemidiaphragm as before. There are persistent but improving perihilar airspace opacities. There is no pneumothorax. No definite acute displaced fracture. No large pleural effusion. IMPRESSION: 1. Improving bilateral perihilar airspace opacities. 2. Endotracheal tube as above. Electronically Signed   By: Constance Holster M.D.   On: 04/26/2020 15:20   DG CHEST PORT 1 VIEW  Result Date: 04/29/2020 CLINICAL DATA:  Endotracheal placement EXAM: PORTABLE CHEST 1 VIEW COMPARISON:  Earlier same day FINDINGS: Endotracheal tube tip 4 cm above the carina. There is worsened atelectasis in the perihilar regions both sides. Possible pulmonary venous hypertension without Yojan edema. No visible effusion. IMPRESSION: Endotracheal tube tip 4 cm above the carina. Worsened perihilar atelectasis. Possible pulmonary venous hypertension. No Kanai edema. No visible effusion or pneumothorax. Electronically Signed   By: Nelson Chimes M.D.   On: 04/18/2020 19:29   DG Abd Portable 1V  Result Date: 04/29/2020 CLINICAL DATA:  Nasogastric tube placement. EXAM: PORTABLE ABDOMEN - 1 VIEW COMPARISON:  April 26, 2020. FINDINGS: The bowel gas pattern is normal. Feeding tube tip is seen in expected position of distal stomach. No radio-opaque calculi or other significant radiographic abnormality are seen. IMPRESSION: Feeding tube tip seen in expected position of distal stomach. No evidence of bowel obstruction or ileus. Electronically Signed   By: Marijo Conception M.D.   On: 04/29/2020 14:29   DG Abd Portable  1V  Result Date: 04/26/2020 CLINICAL DATA:  OG tube placement. EXAM: PORTABLE ABDOMEN - 1 VIEW COMPARISON:  None. FINDINGS: An enteric tube is identified with tip overlying the proximal-mid stomach. IMPRESSION: Enteric tube with tip overlying the proximal-mid stomach. Electronically Signed   By: Margarette Canada M.D.   On: 04/26/2020 09:28   ECHOCARDIOGRAM COMPLETE  Result Date: 04/27/2020    ECHOCARDIOGRAM REPORT   Patient Name:   AKILI CORSETTI Date of Exam: 04/27/2020 Medical Rec #:  [id number]       Height:       73.0 in Accession #:    [id number]      Weight:       249.1 lb Date of Birth:  28-Oct-1940       BSA:          2.362 m Patient Age:    24 years        BP:           118/66 mmHg Patient Gender: M               HR:           81 bpm. Exam Location:  Inpatient Procedure: 2D Echo, Cardiac Doppler, Color Doppler and Intracardiac            Opacification Agent Indications:    Stroke  History:        Patient has prior history of Echocardiogram examinations, most                 recent 03/14/2017. Abnormal ECG, Stroke, Arrythmias:Atrial                 Fibrillation, Signs/Symptoms:Dyspnea and Shortness of Breath;                 Risk Factors:Hypertension, Diabetes and Dyslipidemia.  Sonographer:    Roseanna Rainbow RDCS Referring Phys: [id number] [facility]  Sonographer Comments: Technically difficult study due to poor echo windows, patient is morbidly obese and echo  performed with patient supine and on artificial respirator. Image acquisition challenging due to patient body habitus. IMPRESSIONS  1. Poor acoustic windows and ectopy make accurate evaluation of LVEF difficult Definity used Overall LVEF is probably normal to mildly decreased Consider repeat when patient extubated, able to turn . There is moderate left ventricular hypertrophy. Left ventricular diastolic function could not be evaluated.  2. Right ventricular systolic function is mildly reduced. The right ventricular size is normal. Mildly increased right  ventricular wall thickness.  3. The mitral valve is normal in structure. No evidence of mitral valve regurgitation.  4. The aortic valve is tricuspid. Aortic valve regurgitation is not visualized. Mild to moderate aortic valve sclerosis/calcification is present, without any evidence of aortic stenosis.  5. The inferior vena cava is dilated in size with <50% respiratory variability, suggesting right atrial pressure of 15 mmHg. FINDINGS  Left Ventricle: Poor acoustic windows and ectopy make accurate evaluation of LVEF difficult Definity used Overall LVEF is probably normal to mildly decreased Consider repeat when patient extubated, able to turn. Definity contrast agent was given IV to delineate the left ventricular endocardial borders. The left ventricular internal cavity size was normal in size. There is moderate left ventricular hypertrophy. Left ventricular diastolic function could not be evaluated. Right Ventricle: The right ventricular size is normal. Mildly increased right ventricular wall thickness. Right ventricular systolic function is mildly reduced. Left Atrium: Left atrial size was normal in size. Right Atrium: Right atrial size was normal in size. Pericardium: There is no evidence of pericardial effusion. Presence of pericardial fat pad. Mitral Valve: The mitral valve is normal in structure. No evidence of mitral valve regurgitation. Tricuspid Valve: The tricuspid valve is normal in structure. Tricuspid valve regurgitation is trivial. Aortic Valve: The aortic valve is tricuspid. Aortic valve regurgitation is not visualized. Mild to moderate aortic valve sclerosis/calcification is present, without any evidence of aortic stenosis. Pulmonic Valve: The pulmonic valve was not well visualized. Pulmonic valve regurgitation is not visualized. Aorta: The aortic root is normal in size and structure. Venous: The inferior vena cava is dilated in size with less than 50% respiratory variability, suggesting right atrial  pressure of 15 mmHg. IAS/Shunts: The interatrial septum was not assessed.  LEFT VENTRICLE PLAX 2D LVIDd:         4.80 cm LVIDs:         3.90 cm LV PW:         2.00 cm LV IVS:        1.40 cm LVOT diam:     2.10 cm LV SV:         51 LV SV Index:   21 LVOT Area:     3.46 cm  LV Volumes (MOD) LV vol d, MOD A2C: 103.0 ml LV vol d, MOD A4C: 169.0 ml LV vol s, MOD A2C: 25.8 ml LV vol s, MOD A4C: 70.3 ml LV SV MOD A2C:     77.2 ml LV SV MOD A4C:     169.0 ml LV SV MOD BP:      92.2 ml RIGHT VENTRICLE            IVC RV S prime:     7.29 cm/s  IVC diam: 3.20 cm TAPSE (M-mode): 1.6 cm LEFT ATRIUM             Index       RIGHT ATRIUM           Index LA diam:  5.30 cm 2.24 cm/m  RA Area:     17.00 cm LA Vol (A2C):   69.6 ml 29.46 ml/m RA Volume:   48.20 ml  20.41 ml/m LA Vol (A4C):   94.2 ml 39.88 ml/m LA Biplane Vol: 83.1 ml 35.18 ml/m  AORTIC VALVE LVOT Vmax:   90.80 cm/s LVOT Vmean:  65.000 cm/s LVOT VTI:    0.146 m  AORTA Ao Root diam: 3.70 cm Ao Asc diam:  3.60 cm MITRAL VALVE MV Area (PHT): 5.38 cm     SHUNTS MV Decel Time: 141 msec     Systemic VTI:  0.15 m MV E velocity: 107.90 cm/s  Systemic Diam: 2.10 cm Dorris Carnes MD Electronically signed by Dorris Carnes MD Signature Date/Time: 04/27/2020/7:51:44 PM    Final    ECHO TEE  Result Date: 04/29/2020    TRANSESOPHOGEAL ECHO REPORT   Patient Name:   LAWRNCE REYEZ Date of Exam: 04/29/2020 Medical Rec #:  [id number]       Height:       75.0 in Accession #:    [id number]      Weight:       317.5 lb Date of Birth:  [birth year]       BSA:          2.671 m Patient Age:    80 years        BP:           125/50 mmHg Patient Gender: M               HR:           116 bpm. Exam Location:  Inpatient Procedure: Transesophageal Echo, Cardiac Doppler and Color Doppler Indications:     Stroke 434.91 / I63.9  History:         Patient has prior history of Echocardiogram examinations, most                  recent 04/27/2020. Arrythmias:Atrial Fibrillation,                   Signs/Symptoms:Shortness of Breath; Risk Factors:Hypertension,                  Diabetes and Dyslipidemia.  Sonographer:     Darlina Sicilian RDCS Referring Phys:  [id number] Lorenza Chick Diagnosing Phys: Cherlynn Kaiser MD PROCEDURE: After discussion of the risks and benefits of a TEE, an informed consent was obtained from a family member. The patient was intubated. TEE procedure time was 17 minutes. The transesophogeal probe was passed without difficulty through the esophogus of the patient. Imaged were obtained with the patient in a supine position. Local oropharyngeal anesthetic was provided with Cetacaine. Sedation performed by different physician. The patient was monitored while under deep sedation. Anesthestetic sedation was provided intravenously by Anesthesiology: 53m of Propofol. Image quality was good. The patient's vital signs; including heart rate, blood pressure, and oxygen saturation; remained stable throughout the procedure. The patient developed no complications during the procedure. IMPRESSIONS  1. 1.8 x 0.9 cm mobile echodensity on the atrial aspect of the tricuspid valve. Attachment point is most likely anterior tricuspid valve leaflet. 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 The tricuspid valve is abnormal. Tricuspid valve regurgitation is mild to moderate.  2. Left ventricular ejection fraction, by estimation, is 45 to 50%. The left ventricle has mildly decreased function. There is moderate left ventricular hypertrophy.  3. Right ventricular systolic function is mildly reduced. The  right ventricular size is mildly enlarged.  4. Left atrial size was moderately dilated. No left atrial/left atrial appendage thrombus was detected. The LAA emptying velocity was 72 cm/s.  5. Right atrial size was mild to moderately dilated.  6. The mitral valve is degenerative. Mild mitral valve regurgitation.  7. The aortic valve is abnormal. There is mild calcification of the  aortic valve. Aortic valve regurgitation is trivial. No aortic stenosis is present.  8. Aortic dilatation noted. There is borderline dilatation of the aortic root and of the ascending aorta, measuring 40 mm. There is Severe (Grade IV) atheroma plaque involving the transverse and descending aorta.  9. Agitated saline contrast bubble study was negative, with no evidence of any interatrial shunt. FINDINGS  Left Ventricle: Left ventricular ejection fraction, by estimation, is 45 to 50%. The left ventricle has mildly decreased function. The left ventricular internal cavity size was normal in size. There is moderate left ventricular hypertrophy. Right Ventricle: The right ventricular size is mildly enlarged. Right vetricular wall thickness was not well visualized. Right ventricular systolic function is mildly reduced. Left Atrium: Left atrial size was moderately dilated. No left atrial/left atrial appendage thrombus was detected. The LAA emptying velocity was 72 cm/s. Right Atrium: Right atrial size was mild to moderately dilated. Pericardium: There is no evidence of pericardial effusion. Mitral Valve: The mitral valve is degenerative in appearance. Mild mitral valve regurgitation. Tricuspid Valve: 1.8 x 0.9 cm mobile echodensity on the atrial aspect of the tricuspid valve. Attachment point is most likely anterior tricuspid valve leaflet. 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 The tricuspid valve is abnormal. Tricuspid valve regurgitation is mild to moderate. Aortic Valve: The aortic valve is abnormal. There is mild calcification of the aortic valve. Aortic valve regurgitation is trivial. No aortic stenosis is present. Aortic valve mean gradient measures 4.0 mmHg. Aortic valve peak gradient measures 7.2 mmHg.  Aortic valve area, by VTI measures 3.02 cm. Pulmonic Valve: The pulmonic valve was grossly normal. Pulmonic valve regurgitation is trivial. Aorta: Aortic dilatation noted.  There is borderline dilatation of the aortic root and of the ascending aorta, measuring 40 mm. There is severe (Grade IV) atheroma plaque involving the transverse and descending aorta. IAS/Shunts: No atrial level shunt detected by color flow Doppler. Agitated saline contrast was given intravenously to evaluate for intracardiac shunting. Agitated saline contrast bubble study was negative, with no evidence of any interatrial shunt.  LEFT VENTRICLE PLAX 2D LVOT diam:     2.10 cm LV SV:         56 LV SV Index:   21 LVOT Area:     3.46 cm  AORTIC VALVE AV Area (Vmax):    2.84 cm AV Area (Vmean):   2.71 cm AV Area (VTI):     3.02 cm AV Vmax:           134.00 cm/s AV Vmean:          94.700 cm/s AV VTI:            0.186 m AV Peak Grad:      7.2 mmHg AV Mean Grad:      4.0 mmHg LVOT Vmax:         110.00 cm/s LVOT Vmean:        74.100 cm/s LVOT VTI:          0.162 m LVOT/AV VTI ratio: 0.87  AORTA Ao Root diam: 4.00 cm Ao Asc diam:  4.00 cm TRICUSPID VALVE  TR Peak grad:   30.9 mmHg TR Vmax:        278.00 cm/s  SHUNTS Systemic VTI:  0.16 m Systemic Diam: 2.10 cm Cherlynn Kaiser MD Electronically signed by Cherlynn Kaiser MD Signature Date/Time: 04/29/2020/9:50:53 PM    Final    IR PERCUTANEOUS ART THROMBECTOMY/INFUSION INTRACRANIAL INC DIAG ANGIO  Result Date: 04/27/2020 INDICATION: 80 year old male with past medical history significant for diabetes mellitus type 2, hypertension, hyperlipidemia and atrial fibrillation status post cardioversion in November 2021 on Xarelto. Patient had an episode of transient slurred speech and mild left arm weakness on 04/24/2020. Around noon on 04/14/2020, he developed significant left arm weakness, slurred speech and left facial droop. He was taken to outside hospital where he was found to have diminished opacification of the right MCA branches on CTA with area of increased T-max in the right MCA territory. He was taken to our service for a diagnostic cerebral angiogram and possible  mechanical thrombectomy. EXAM: Ultrasound-guided vascular access Diagnostic cerebral angiogram Mechanical thrombectomy Intracranial stenting Flat panel head CT COMPARISON:  CT/CT angiogram of the head and neck April 25, 2020. MEDICATIONS: Refer to anesthesia documentation. ANESTHESIA/SEDATION: The procedure was performed under general anesthesia. CONTRAST:  75 mL of Omnipaque 240 milligrams/mL FLUOROSCOPY TIME:  Fluoroscopy Time: 113 minutes 6 seconds (2,379 mGy). COMPLICATIONS: None immediate. TECHNIQUE: Informed written consent was obtained from the patient's sister after a thorough discussion of the procedural risks, benefits and alternatives. All questions were addressed. Maximal Sterile Barrier Technique was utilized including caps, mask, sterile gowns, sterile gloves, sterile drape, hand hygiene and skin antiseptic. A timeout was performed prior to the initiation of the procedure. The right groin was prepped and draped in the usual sterile fashion. Using a micropuncture kit and the modified Seldinger technique, access was gained to the right common femoral artery and an 8 French sheath was placed. Real-time ultrasound guidance was utilized for vascular access including the acquisition of a permanent ultrasound image documenting patency of the accessed vessel. Under fluoroscopy, a 6 [place] Berenstein 2 catheter was navigated over a 0.035" Terumo Glidewire into the aortic arch. The catheter was placed into the right common carotid artery and then advanced into the right internal carotid artery. Frontal and lateral angiograms of the head were obtained. FINDINGS: 1. Near occlusion of a proximal right M2/MCA middle division branch with minimal delayed contrast penetration of distal branches. 2. Atherosclerotic changes of the right carotid siphon without hemodynamically significant stenosis. 3. Atherosclerotic changes of the right common femoral artery without hemodynamically significant stenosis. PROCEDURE:  Under biplane roadmap, the Berenstein 2 catheter was exchanged over the wire for a zoom 88 guide catheter. The catheter was placed in the upper cervical segment of the right ICA. A zoom 71 catheter was then navigated through the guide catheter into the petrous segment of the right ICA. Magnified frontal and lateral views of the head were obtained. Then a phenom 21 microcatheter was navigated over a synchro support microguidewire into the right M1/MCA. Multiple attempts to gain access to the right M2 branch proved unsuccessful. The synchro support micro guidewire was then exchanged for a 0.016" headliner micro guidewire which was advanced into the distal right M2/MCA branch. The microcatheter was then advanced over the wire. The microwire was removed and a 4 x 40 solitaire thrombectomy device was deployed spanning the right M2/MCA middle division branch and distal M1 segment. The device was allowed to intercalated with the clot for 4 minutes. The aspiration catheter was advanced to the level  of occlusion. The microcatheter was removed. The aspiration catheter and thrombectomy device were removed under constant aspiration. Follow-up right ICA angiograms with magnified frontal and lateral views of the head showed brisk opacification of the right M2/MCA middle division branch with irregularity noted at the branch origin [facility]). Delayed follow-up angiogram showed branch reocclusion. Under biplane roadmap, the zoom 71 aspiration catheter was again navigated over a phenom 21 microcatheter and a headliner microguidewire into the cavernous segment of the right ICA. The microcatheter was then navigated over the wire into the right M2/MCA middle division branch. Then, a 4 x 40 mm solitaire stent retriever was deployed spanning the M2 and distal M1 segments. The device was allowed to intercalated with the clot for 4 minutes. The microcatheter was removed. The aspiration catheter was advanced to the level of occlusion and  connected to a penumbra aspiration pump. The thrombectomy device and aspiration catheter were removed under constant aspiration. Follow-up right ICA angiograms with magnified frontal and lateral views of the head showed brisk opacification of the right M2/MCA middle division branch with irregularity noted at the branch origin [facility] Delayed follow-up angiogram showed branch reocclusion. Under biplane roadmap, the zoom 71 aspiration catheter was again navigated over a phenom 21 microcatheter and a headliner microguidewire into the cavernous segment of the right ICA. The microcatheter was then navigated over the wire into the right M2/MCA middle division branch. Then, a 5 x 37 mm embotrap stent retriever was deployed spanning the M2 and distal M1 segments. The device was allowed to intercalated with the clot for 4 minutes. The microcatheter was removed. The aspiration catheter was advanced to the level of occlusion and connected to a penumbra aspiration pump. The thrombectomy device and aspiration catheter were removed under constant aspiration. Follow-up right ICA angiograms with magnified frontal and lateral views of the head showed recanalization of the right M2/MCA middle division branch with hairline stenosis at its origin due to atherosclerotic plaque and stenosis at the mid M2 segment with very delayed opacification of distal branches. Flat panel CT of the head was obtained and post processed in a separate workstation with concurrent attending physician supervision. Selected images were sent to PACS. No evidence of hemorrhagic complication noted. Under biplane roadmap, the zoom 71 aspiration catheter was again navigated over a phenom 21 microcatheter and a Aristotle 14 microguidewire into the cavernous segment of the right ICA. The microcatheter was then navigated over the wire into the right M2/MCA middle division branch. The Aristotle microguidewire was exchanged for a an exchange length synchro  microguidewire. At this point, patient developed severe bradycardia and eventually asystole. The microwire and microcatheter were removed. The table was lowered to start chest compressions. However, patient had spontaneous return of circulation before the start of resuscitation efforts. Follow-up right ICA angiograms with magnified frontal and lateral views of the head again showed recanalization of the right M2/MCA middle division branch with with hairline stenosis. No evidence of contrast extravasation. Under biplane roadmap, an SL 10 microcatheter was navigated over Aristotle 14 microguidewire into the right M2/MCA middle division branch. Subsequently, a 4.5 x 30 mm neuroform atlas stent was deployed spanning the distal right M1, proximal and mid M2/MCA middle division branch. Follow-up angiogram showed brisk flow through the entire right MCA territory. The catheter construct was subsequently withdrawn. A right common femoral angiogram was performed in right anterior oblique view. The puncture is at the common femoral artery level. Atherosclerotic plaques are noted without hemodynamically significant stenosis. The sheath was exchanged for  an 8 [place] Angio-Seal which was utilized for access closure. Immediate hemostasis was achieved. IMPRESSION: Mechanical thrombectomy and intracranial stenting performed for treatment of a proximal right M2/MCA middle division branch near occlusion with superimposed atherosclerotic plaque. A total of 3 passes performed with stent retriever followed by intracranial stenting. Intracranial stenting performed after bolus infusion of cangrelor followed by low-dose protocol drip. PLAN: Patient remained intubated and transferred to ICU given hemodynamic instability. Continue cangrelor drip until transition to aspirin and Brilinta. Family updated by telephone immediately after the end of the procedure. Electronically Signed   By: Pedro Earls M.D.   On: 04/27/2020 10:36    CT ANGIO HEAD CODE STROKE  Result Date: 05/03/2020 CLINICAL DATA:  Slurred speech and left-sided weakness. EXAM: CT ANGIOGRAPHY HEAD AND NECK CT PERFUSION BRAIN TECHNIQUE: Multidetector CT imaging of the head and neck was performed using the standard protocol during bolus administration of intravenous contrast. Multiplanar CT image reconstructions and MIPs were obtained to evaluate the vascular anatomy. Carotid stenosis measurements (when applicable) are obtained utilizing NASCET criteria, using the distal internal carotid diameter as the denominator. Multiphase CT imaging of the brain was performed following IV bolus contrast injection. Subsequent parametric perfusion maps were calculated using RAPID software. CONTRAST:  93m OMNIPAQUE IOHEXOL 350 MG/ML SOLN COMPARISON:  None. FINDINGS: CT HEAD FINDINGS Brain: There is no evidence of an acute infarct, intracranial hemorrhage, mass, midline shift, or extra-axial fluid collection. Mild cerebral atrophy is within normal limits for age. Patchy hypodensities in the cerebral white matter bilaterally are nonspecific but compatible with mild chronic small vessel ischemic disease. There is a chronic lacunar infarct at the inferior aspect of the right caudate head. Vascular: Calcified atherosclerosis at the skull base. No hyperdense vessel. Skull: No fracture or suspicious osseous lesion. Sinuses/Orbits: Mild scattered mucosal thickening in the paranasal sinuses. Clear mastoid air cells. Bilateral cataract extraction. Other: None. ASPECTS ([place] Program Early CT Score) - Ganglionic level infarction (caudate, lentiform nuclei, internal capsule, insula, M1-M3 cortex): 7 - Supraganglionic infarction (M4-M6 cortex): 3 Total score (0-10 with 10 being normal): 10 Review of the MIP images confirms the above findings CTA NECK FINDINGS Aortic arch: Normal variant aortic arch branching pattern with common origin of the brachiocephalic and left common carotid arteries.  Mild calcified plaque in the aortic arch without evidence of a significant arch vessel origin stenosis. Right carotid system: Patent with mild scattered predominantly calcified plaque throughout the common carotid artery and at the carotid bifurcation. No evidence of a significant stenosis or dissection. Left carotid system: Patent with mild plaque in the mid common carotid artery not resulting in significant stenosis. More extensive, heavily calcified plaque at the carotid bifurcation results in less than 50% proximal ICA stenosis. No evidence of dissection. Vertebral arteries: There is either severe stenosis or short segment occlusion of the proximal left vertebral artery at its origin with the vessel being patent throughout the remainder of the neck. There is a diffusely thready appearance of the right vertebral artery V1 and V2 segments with the right V3 segment being occluded. Skeleton: Advanced cervical disc and facet degeneration. Other neck: No evidence of cervical lymphadenopathy or mass. Upper chest: Partially visualized small right pleural effusion. No apical lung consolidation. Review of the MIP images confirms the above findings CTA HEAD FINDINGS Anterior circulation: The internal carotid arteries are patent from skull base to carotid termini with calcified plaque resulting in mild cavernous and proximal supraclinoid stenosis bilaterally. The right M1 segment is patent with  a mild stenosis proximally. There is a decreased number of distal right MCA branch vessels compared to the left, however a discrete proximal branch occlusion is not identified. The left MCA is patent with branch vessel irregularity as well as a mild-to-moderate proximal M2 stenosis. Both ACAs are patent without evidence of a significant proximal stenosis. No aneurysm is identified. Posterior circulation: The intracranial left vertebral artery is patent but diffusely irregular and narrowed with multiple moderate to severe stenoses.  The right V4 segment is occluded proximally with likely retrograde filling distally supplying the right PICA. 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 Patent SCAs are seen bilaterally. There is a fetal origin of the PCAs without evidence of a significant proximal PCA stenosis. No aneurysm is identified. Venous sinuses: Patent. Anatomic variants: Fetal origin of the PCAs. Review of the MIP images confirms the above findings CT Brain Perfusion Findings: ASPECTS: 10 CBF (<30%) Volume: 0 mL Perfusion (Tmax>6.0s) volume: 55 mL Mismatch Volume: 55 mL Infarction Location: No core infarct evident by CTP. Ischemic penumbra in the right MCA territory, greatest in the frontal lobe at and superior to the operculum though with parietal and temporal lobe involvement as well. IMPRESSION: CT HEAD: 1. No evidence of acute intracranial abnormality.  ASPECTS of 10. 2. Mild chronic small vessel ischemic disease. CTA HEAD AND NECK: 1. Decreased number of right MCA branch vessels without a discrete proximal occlusion identified. It is possible that this reflects a stump occlusion of a proximal to mid M2 branch which is not readily apparent by CTA. 2. Intracranial atherosclerosis with multiple anterior and posterior circulation stenoses as detailed above. 3. 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 4. Severe stenosis or short segment occlusion of the left vertebral artery at its origin with patency of the vessel more distally. Heavily diseased left V4 segment. 5. Cervical carotid atherosclerosis without significant stenosis. 6.  Aortic Atherosclerosis (ICD10-I70.0). CT BRAIN PERFUSION: 55 mL of ischemic penumbra in the right MCA territory without evidence of a core infarct. These results were called by telephone at the time of interpretation on 04/24/2020 at 2:15 pm to Dr. Donnetta Simpers, who verbally  acknowledged these results. Electronically Signed   By: Logan Bores M.D.   On: 04/24/2020 14:58   CT ANGIO NECK CODE STROKE  Result Date: 04/09/2020 CLINICAL DATA:  Slurred speech and left-sided weakness. EXAM: CT ANGIOGRAPHY HEAD AND NECK CT PERFUSION BRAIN TECHNIQUE: Multidetector CT imaging of the head and neck was performed using the standard protocol during bolus administration of intravenous contrast. Multiplanar CT image reconstructions and MIPs were obtained to evaluate the vascular anatomy. Carotid stenosis measurements (when applicable) are obtained utilizing NASCET criteria, using the distal internal carotid diameter as the denominator. Multiphase CT imaging of the brain was performed following IV bolus contrast injection. Subsequent parametric perfusion maps were calculated using RAPID software. CONTRAST:  26m OMNIPAQUE IOHEXOL 350 MG/ML SOLN COMPARISON:  None. FINDINGS: CT HEAD FINDINGS Brain: There is no evidence of an acute infarct, intracranial hemorrhage, mass, midline shift, or extra-axial fluid collection. Mild cerebral atrophy is within normal limits for age. Patchy hypodensities in the cerebral white matter bilaterally are nonspecific but compatible with mild chronic small vessel ischemic disease. There is a chronic lacunar infarct at the inferior aspect of the right caudate head. Vascular: Calcified atherosclerosis at the skull base. No hyperdense vessel. Skull: No fracture or suspicious osseous lesion. Sinuses/Orbits: Mild scattered mucosal thickening  in the paranasal sinuses. Clear mastoid air cells. Bilateral cataract extraction. Other: None. ASPECTS ([place] Stroke Program Early CT Score) - Ganglionic level infarction (caudate, lentiform nuclei, internal capsule, insula, M1-M3 cortex): 7 - Supraganglionic infarction (M4-M6 cortex): 3 Total score (0-10 with 10 being normal): 10 Review of the MIP images confirms the above findings CTA NECK FINDINGS Aortic arch: Normal variant aortic  arch branching pattern with common origin of the brachiocephalic and left common carotid arteries. Mild calcified plaque in the aortic arch without evidence of a significant arch vessel origin stenosis. Right carotid system: Patent with mild scattered predominantly calcified plaque throughout the common carotid artery and at the carotid bifurcation. No evidence of a significant stenosis or dissection. Left carotid system: Patent with mild plaque in the mid common carotid artery not resulting in significant stenosis. More extensive, heavily calcified plaque at the carotid bifurcation results in less than 50% proximal ICA stenosis. No evidence of dissection. Vertebral arteries: There is either severe stenosis or short segment occlusion of the proximal left vertebral artery at its origin with the vessel being patent throughout the remainder of the neck. There is a diffusely thready appearance of the right vertebral artery V1 and V2 segments with the right V3 segment being occluded. Skeleton: Advanced cervical disc and facet degeneration. Other neck: No evidence of cervical lymphadenopathy or mass. Upper chest: Partially visualized small right pleural effusion. No apical lung consolidation. Review of the MIP images confirms the above findings CTA HEAD FINDINGS Anterior circulation: The internal carotid arteries are patent from skull base to carotid termini with calcified plaque resulting in mild cavernous and proximal supraclinoid stenosis bilaterally. The right M1 segment is patent with a mild stenosis proximally. There is a decreased number of distal right MCA branch vessels compared to the left, however a discrete proximal branch occlusion is not identified. The left MCA is patent with branch vessel irregularity as well as a mild-to-moderate proximal M2 stenosis. Both ACAs are patent without evidence of a significant proximal stenosis. No aneurysm is identified. Posterior circulation: The intracranial left vertebral  artery is patent but diffusely irregular and narrowed with multiple moderate to severe stenoses. The right V4 segment is occluded proximally with likely retrograde filling distally supplying the right PICA. 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 Patent SCAs are seen bilaterally. There is a fetal origin of the PCAs without evidence of a significant proximal PCA stenosis. No aneurysm is identified. Venous sinuses: Patent. Anatomic variants: Fetal origin of the PCAs. Review of the MIP images confirms the above findings CT Brain Perfusion Findings: ASPECTS: 10 CBF (<30%) Volume: 0 mL Perfusion (Tmax>6.0s) volume: 55 mL Mismatch Volume: 55 mL Infarction Location: No core infarct evident by CTP. Ischemic penumbra in the right MCA territory, greatest in the frontal lobe at and superior to the operculum though with parietal and temporal lobe involvement as well. IMPRESSION: CT HEAD: 1. No evidence of acute intracranial abnormality.  ASPECTS of 10. 2. Mild chronic small vessel ischemic disease. CTA HEAD AND NECK: 1. Decreased number of right MCA branch vessels without a discrete proximal occlusion identified. It is possible that this reflects a stump occlusion of a proximal to mid M2 branch which is not readily apparent by CTA. 2. Intracranial atherosclerosis with multiple anterior and posterior circulation stenoses as detailed above. 3. 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 4. Severe stenosis or short segment occlusion of the left vertebral  artery at its origin with patency of the vessel more distally. Heavily diseased left V4 segment. 5. Cervical carotid atherosclerosis without significant stenosis. 6.  Aortic Atherosclerosis (ICD10-I70.0). CT BRAIN PERFUSION: 55 mL of ischemic penumbra in the right MCA territory without evidence of a core infarct. These results were called by telephone at the  time of interpretation on 04/18/2020 at 2:15 pm to Dr. Donnetta Simpers, who verbally acknowledged these results. Electronically Signed   By: Logan Bores M.D.   On: 04/27/2020 14:58    PHYSICAL EXAM  Temp:  [96.4 F (35.8 C)-98.6 F (37 C)] 96.4 F (35.8 C) (02/25 0800) Pulse Rate:  [91-114] 101 (02/25 1108) Resp:  [15-31] 21 (02/25 1300) BP: (102-124)/(44-49) 124/49 (02/25 1108) SpO2:  [89 %-100 %] 99 % (02/25 1108) Arterial Line BP: (86-147)/(44-69) 122/49 (02/25 1300) FiO2 (%):  [40 %-50 %] 40 % (02/25 1108) Weight:  [141.2 kg] 141.2 kg (02/25 0500)  General - Well nourished, well developed, intubated, off sedation.  Ophthalmologic - fundi not visualized due to noncooperation.  Cardiovascular - irregularly irregular heart rate and rhythm.  Neuro - On exam, intubated on precedex, stuperous, eyes closed, doesn't open eyes on noxious stimuli . Not  able to follow simple commands, however, Pt able to wiggle toes on request. With forced eye opening, eyes in mid position, not blinking to visual threat, doll's eyes sluggish, not tracking. Corneal reflex weakly present, gag and cough present. Breathing barely over the vent.  Facial symmetry not able to test due to ET tube.  Tongue protrusion not cooperative. Not able to raise extremities against gravity. DTR diminished and right positive babinski. Sensation, coordination and gait not tested.  ASSESSMENT/PLAN Mr. Noble Cicalese is a 80 y.o. male with history of PMHx of DM II, HTN, AF s/p cardioversion in 11/2019 on Xarelto, and HLD transfer from [facility] for significant left arm weakness, slurred speech and left facial droop. Time onset 1200pm. NIHSS = 4. CT no acute finding. CTA head and neck left ICA bulb and b/l ICA siphon severe athero with stenosis. Possible right M2 branch occlusion. CTP right MCA penumbra. Last dose Xarelto last night. Pt not tPA candidate given on Xarelto. He was transferred to [facility] for EVT.  ECHO showed Overall LVEF is  probably normal to mildly decreased. CXR done showed Suspect pneumonia right perihilar region.CT head on 2/22 showed Scattered cortical and subcortical areas of hypoattenuation within the right MCA distribution, compatible with evolving infarcts. No progressive mass effect. Subtle cortical versus sulcal hyperdensity along the high right posterior frontal convexity may represent petechial hemorrhage versus trace subarachnoid hemorrhage.   Blood c/s and urine c/s positive for Staph Aureus. Nafcillin started on 04/29/20. Nephro consulted, had HD on 2/22 and 2/23.TEE showedTricuspid valve endocarditis. 1.8 x 0.9 cm lesions likely on anterior tricuspid valve leaflet, likely adherent to atrial aspect of valve. Pt on nafcillin for CNS coverage too. CCM following. ID consulted, on board too. EEG showed suggestive of cortical dysfunction in right hemisphere due to underlying structural abnormality as well as moderate to severe diffuse encephalopathy  Stroke:  right MCA scattered infarcts and two left ACA punctate infarcts due to left M2 occlusion s/p stenting, more likely large vessel stenosis given plaque occluding left M2. However, also possible due to AF despite on Xarelto, or related to endocarditis  CT no acute finding.   CTA head and neck left ICA bulb and b/l ICA siphon severe athero with stenosis. Questionable right M2 branch occlusion.   CTP right MCA  penumbra.  IR - right M2 branch occlusion with plaque - s/p stenting  MRI  Scattered acute cortical infarcts in the right MCA territory and right occipital lobe. Two tiny cortical infarcts in the left cerebral hemisphere.  MRA  Present flow in the stented right M2 branch.  CT head 2/22 -Scattered cortical and subcortical areas of hypoattenuation within the right MCA distribution, compatible with evolving infarcts.   2D Echo- Overall LVEF is probably normal to mildly decreased.   TEE Tricuspid valve endocarditis.  EF 45%  LDL < 10  HgbA1c  9.8  Heparin subq for VTE prophylaxis  Xarelto (rivaroxaban) daily prior to admission, now on aspirin 81 mg daily and Brilinta (ticagrelor) 90 mg bid due to stenting. Will need to resume AC once appropriate  Ongoing aggressive stroke risk factor management  Therapy recommendations:  pending  Disposition:  pending  Respiratory failure  Intubated for procedure  Not able to extubate after procedure  Currently not candidate to extubate due to mental status  CCM on board  Endocarditis  TEE showed Tricuspid valve endocarditis. 1.8 x 0.9 cm lesions likely on anterior tricuspid valve leaflet, likely adherent to atrial aspect of valve.   On nafcillin for CNS coverage too  Encephalopathy   EEG cortical dysfunction in right hemisphere due to underlying structural abnormality as well as moderate to severe diffuse encephalopathy  ? Bacterial meningitis - not stable for LP, could be partial treated so CSF may not have good interpretation - continue nafcillin for CNS coverage.  Cardiac arrest  CHF  Cardiac arrest for 20-30s and ROSC - no CPR needed  Concerning for CHF   CXR showed no Gage edema  TTE Overall LVEF is probably normal to mildly decreased.  TEE EF 45%  Tele monitoring  Chronic AF   s/p cardioversion in 11/2019   on Xarelto  Rate controlled  Need to resume AC once appropriate  Intracranial stenosis  CTA head and neck left ICA bulb and b/l ICA siphon severe athero with stenosis.  Avoid low BP  Long term BP goal 130-150  Diabetes  HgbA1c 9.8 goal < 7.0  Uncontrolled  CBG monitoring  SSI  Hypoglycemia on 2/20- BG-45, on 2/23- CBG's-23, 67  On tube feeding now  DM education and close PCP follow up  Hypertension/Hypotension . Fluctuate according to sedation status . BP fluctuate  . Off cleviprex and neo  Long term BP goal 130-150 given intracranial stenosis  On Levophed   Hyperlipidemia  Home meds:  lipitor 80   LDL < 10, goal <  70  on lipitor 80  Continue statin at discharge  AKI ? Vs. CKD ?  Cre 3.00->3.35->3.71>3.93>4.38>4.46>3.99->3.83  Hyperkalemia - 7.2->5.5->5.0>5.4>6.1>Lokelma>5.0>4.5  CCM on board  Nephro on Board  On HD -> CRRT  Could be related to iodine contrast  Fluid Overload  Likely due to worsening renal function  Started on Lasix on 2/21  Lasix Stopped per Nephrology  HD per Nephro  Fever UTI Pneumonia Leucocytosis Septic shock   100.4>101.8>102.38>99.14>99.86>afebrile  Leukocytosis - WBC 16.1->12.2>15.2>16.4>14.7>14.3  CXR- suspected pneumonia right perihilar region.  UA WBC > 50  UA C/s - Staph aureus  Blood C/s- Staph aureus  Pt on Nafcillin (2/23) planned for 6 weeks as per ID  Monitor Temp  CCM Following  Buspar started on 2/22  On levophed for BP control  ID consulted, signed off.  Anemia   Hb 8.6->8.3>9.0>8.8  Received 2 units PRBC on 2/19  Monitor CBC  CCM following.  Iron  14, TIBC 99, Ferritin 867.  Transfuse per protocol.  Other Stroke Risk Factors  Advanced age  Obesity, Body mass index is 38.91 kg/m.   Other Active Problems    Hospital day # 6    ATTENDING NOTE: I reviewed above note and agree with the assessment and plan. Pt was seen and examined.   RN at bedside, patient still intubated, eyes closed, however, able to follow simple commands around right hand and right foot only.  Mild withdraw of pain stimulation on the right but not on the left.  Patient still has AKI but improving, currently on CRRT.  No fever today, leukocytosis improving.  Still on nafcillin.  BP on the lower side is still on Levophed.  Continue aspirin Brilinta and statin for right MCA stent, hemoglobin stable.  Once stabilized, endocarditis treated and no further procedure planned, may consider resume Xarelto for stroke prevention with chronic A. fib.  At this time, neurology has no further recommendations. We will follow peripherally, please feel  free to call with any questions.  For detailed assessment and plan, please refer to above as I have made changes wherever appropriate.   Rosalin Hawking, MD PhD Stroke Neurology 05/01/2020 5:37 PM  This patient is critically ill due to right MCA infarct s/p IR, endocarditis, sepsis, AKI, cardiac arrest, CHF, Afib on AC and at significant risk of neurological worsening, death form recurrent stroke, spetic shock, renal failure, heart failure, seizure. This patient's care requires constant monitoring of vital signs, hemodynamics, respiratory and cardiac monitoring, review of multiple databases, neurological assessment, discussion with family, other specialists and medical decision making of high complexity. I spent 30 minutes of neurocritical care time in the care of this patient.   To contact Stroke Continuity provider, please refer to [email or web address] After hours, contact General Neurology

## 2020-05-01 NOTE — Procedures (Signed)
Arterial Catheter Insertion Procedure Note  Robert Daniel  [id number]  Jul 22, 1940  Date:05/01/20  Time:8:11 PM    Provider Performing: Marlowe Aschoff    Procedure: Insertion of Arterial Line [phone]) without US guidance  Indication(s) Blood pressure monitoring and/or need for frequent ABGs  Consent Unable to obtain consent due to emergent nature of procedure.  Anesthesia None   Time Out Verified patient identification, verified procedure, site/side was marked, verified correct patient position, special equipment/implants available, medications/allergies/relevant history reviewed, required imaging and test results available.   Sterile Technique Maximal sterile technique including full sterile barrier drape, hand hygiene, sterile gown, sterile gloves, mask, hair covering, sterile ultrasound probe cover (if used).   Procedure Description Area of catheter insertion was cleaned with chlorhexidine and draped in sterile fashion. Without real-time ultrasound guidance an arterial catheter was placed into the right radial artery.  Appropriate arterial tracings confirmed on monitor.     Complications/Tolerance None; patient tolerated the procedure well.   EBL Minimal   RT replaced pt. Art line with MD permission. Previous art line was malfunctioning. No issues.   Specimen(s) None

## 2020-05-01 NOTE — Progress Notes (Signed)
  Hypoglycemic Event  CBG: 68 at 1925  Treatment: 12.5 gm D 50  Symptoms:none  Follow-up CBG: 82 Time: 2022   Possible Reasons for Event: No tube feeding for 3 hours while waiting for new tube feeding to be sent.  Comments/MD notified:Elink notified    Paulina Fusi

## 2020-05-01 NOTE — Progress Notes (Signed)
NAME:  Robert Daniel, MRN:  [id number], DOB:  1940-06-23, LOS: 6 ADMISSION DATE:  04/18/2020, CONSULTATION DATE:  04/13/2020 REFERRING MD:  Erlinda Hong - neuro, CHIEF COMPLAINT:  R MCA CVA, respiratory failure  Brief History:  80 yo R MCA CVA, s/p stent in NIR, remains intubated  History of Present Illness:  80 yo M PMH Afib on xarelto, HTN, DM2, rpesented to [facility] 2/19 with weakness and slurred speech. Sx began 2/18, but resolved, until 2/19 approx noon when he had L arm weakness, slurred speech, L facial droop. Last took xarelto 2/18 evening. Found to have R MCA CVA Transferred to [facility], and taken urgently to NIR for mechanical thrombectomy of proximal R M2/MCA occlusion, with subsequent reocclusion x 3, followed by intracranial stent deployment with TICI3 recanalization. NIR course also c/b pt instability-- with hypotension,  pause vs asystole with spontaneous ROSC following a prop bolus. He received 2 PRBC in NIR for hypotension; as well as albumin and neo. Remains intubated, transferring to ICU. Received repeat paralytic prior to ICU transfer   Past Medical History:  HTN Afib HLD DM2  Significant Hospital Events:  2/19 [facility] ED as code stroke. R MCA CVA. Transferred to [facility] for NIR. Brief asystolic arrest with spontaneous ROSC vs pause in NIR. Remains intubated 2/19 Mechanical thrombectomy right M2 occlusion with TICI-3 flow.  Consults:  NIR PCCM  Procedures:  2/19 ETT> 2/19 Art line>  2/22 temporary right HD catheter.  Significant Diagnostic Tests:  2/19 CTA H> R MCA proximal occlusion   2/23 Progressed size and extent of infarcts widely scattered in the right MCA and PCA territories since 04/26/2020. Associated petechial hemorrhage as suspected by CT. No malignant hemorrhagic transformation. Mild mass effect with no midline shift at this time.  Small new lacunar type infarcts in the left cerebellar vermis and right midbrain. Several small left MCA territory cortical infarcts again  noted.  Stable intracranial MRA from 2 days ago. No large vessel occlusion. Stable MRA appearance of right MCA M2 stent.  2/23 TEE: normal LV, RV function, trace AI, mild MR, mild TR. Tricuspid valve endocarditis.  2/23 EEG: diffuse encephalopathy but without active seizures.  Micro Data:  COVID-19 neg 2/22 - Blood and urine cultures positive for MSSA  Antimicrobials:  2/22 ceftriaxone >> 2/23 nafcillin   Interim History / Subjective:   Still not tolerated fluid removal with IHD. Off sedation but not yet following commands.   Objective   Blood pressure (!) 124/49, pulse (!) 101, temperature (!) 96.4 F (35.8 C), temperature source Axillary, resp. rate (!) 21, height '6\' 3"'$  (1.905 m), weight (!) 141.2 kg, SpO2 99 %.    Vent Mode: PRVC FiO2 (%):  [40 %-50 %] 40 % Set Rate:  [22 bmp] 22 bmp Vt Set:  [630 mL] 630 mL PEEP:  [10 cmH20] 10 cmH20 Plateau Pressure:  [18 cmH20-23 cmH20] 21 cmH20   Intake/Output Summary (Last 24 hours) at 05/01/2020 1353 Last data filed at 05/01/2020 1300 Gross per 24 hour  Intake 3335.27 ml  Output 5335 ml  Net -1999.73 ml   Filed Weights   04/30/20 1100 05/01/20 0500  Weight: (!) 141.3 kg (!) 141.2 kg    Examination: General: obese man intubated HENT:ETT secure, SBFT in place   Lungs: Mechanically ventilated, lungs are clear, minimal secretions Cardiovascular: irregular rhythm reg rate s1s2, JVP elevated. No murmur Abdomen: Obese soft round non-distended Extremities: BLE 2+ pitting edema. No obvious joint deformity. R fem site without hematoma.  Neuro: Withdraws to  pain, intermittently follows command, profoundly weak, not consistently following commands    Resolved Hospital Problem list     Assessment & Plan:   Critically ill due to acute ischemic stroke S/P s/p mechanical thrombectomy-- subsequent reocclusion x 3, stent placement with TICI3, requiring mechanical ventilation for airway protection  - continue asa, brillinta  - was on  xarelto prior to admission, will defer to neuro on when appropriate to resume - EEG 2/23 demonstrates no seizures,right hemispheric cortical dysfunction, nonspecific.  Critically ill due to right sided endocarditis due to S.aureus - maintain normothermia for neuro protective measures - continuous Nafcillian, appreciate ID consultation  Critically ill due to Septic shock requiring titration NE HFrEF EF 45% - maintain norepinephrine, will titrate down as tolerated. He is volume overloaded, additional fluid resuscitation not helpful here - suspect some component of cardiogenic shock here as well  Acute hypoxemic respiratory failure - Continue full ventilatory support - daily SBT. No likely to tolerate PSV until after fluid removal   AKI on CKD - now on CRRT Continue CRRT with more aggressive volume removal to facilitate ventilator weaning  Toxic metabolic and uremic encephalopathy.  - multifactorial in the setting of sepsis, stroke - continue CRRT - delirium precautions - minimize sedation  Anemia of acute illness.   DM2 - monitor CBGs - SSI  Diarrhea - watery, C. Difficile sent   Daily Goals Checklist  Pain/Anxiety/Delirium protocol (if indicated): wean precedex to off , keep RASS 0  Neuro vitals: every 4 hours AED's: none VAP protocol (if indicated): bundle in place Respiratory support goals: Continue full ventilator at current levels, start to wean once fluid removed with HD Blood pressure target: NE to keep MAP> 65 DVT prophylaxis: SCD's, start Gibson heparin Nutrition Status: continue enteral nutrition.  Fiber for tube feed diarrhea.  GI prophylaxis: protonix Fluid status goals: Fluid removal with HD Urinary catheter: Assessment of intravascular volume Central lines: right HD  Glucose control: improved on tube feeds.  Mobility/therapy needs: bedrest  Antibiotic de-escalation: none Home medication reconciliation: on hold Daily labs: BMP, CBC Code Status: Full  Family  Communication: will update sister by phone today Disposition: ICU  Goals of Care:  Last date of multidisciplinary goals of care discussion: per primary  Family and staff present:  Summary of discussion:  Follow up goals of care discussion due:-- Code Status: Full   Labs   CBC: Recent Labs  Lab 04/26/2020 1417 05/01/2020 1609 04/27/20 0444 04/28/20 0500 04/29/20 0604 04/30/20 0042 05/01/20 0848  WBC 16.8*   < > 12.2* 15.2* 16.4* 14.7* 14.3*  NEUTROABS 15.3*  --   --   --   --   --   --   HGB 8.1*   < > 8.6* 8.6* 8.3* 9.0* 8.8*  HCT 25.0*   < > 27.7* 28.0* 27.1* 28.3* 27.4*  MCV 88.0   < > 89.6 91.2 89.4 87.1 87.5  PLT 251   < > 217 191 146* 142* 127*   < > = values in this interval not displayed.    Basic Metabolic Panel: Recent Labs  Lab 04/27/20 0444 04/27/20 1632 04/28/20 0500 04/28/20 1108 04/28/20 1521 04/28/20 2100 04/29/20 0604 04/30/20 0042 04/30/20 1700 05/01/20 0512  NA 140  --  141  --   --   --  144 141 139 136  K 5.4*  --  6.1*   < > 5.8*  --  5.0 4.5 4.9 5.4*  CL 108  --  109  --   --   --  108 103 104 101  CO2 16*  --  15*  --   --   --  19* 22 20* 21*  GLUCOSE 126*  --  274*  --   --   --  73 104* 112* 175*  BUN 101*  --  132*  --   --   --  127* 103* 102* 83*  CREATININE 3.93*  --  4.38*  --   --   --  4.46* 3.99* 3.83* 3.14*  CALCIUM 8.0*  --  7.8*  --   --   --  7.3* 7.3* 6.9* 7.0*  MG 2.1 2.3 2.3  --   --  2.2  --   --   --  2.5*  PHOS 8.2* 8.4* 9.3*  --   --   --   --   --  7.8* 7.2*   < > = values in this interval not displayed.   GFR: Estimated Creatinine Clearance: 28.5 mL/min (A) (by C-G formula based on SCr of 3.14 mg/dL (H)). Recent Labs  Lab 04/13/2020 1411 05/03/2020 1417 04/28/20 0500 04/29/20 0604 04/30/20 0042 05/01/20 0848  PROCALCITON 2.21  --   --   --   --   --   WBC  --    < > 15.2* 16.4* 14.7* 14.3*   < > = values in this interval not displayed.    Liver Function Tests: Recent Labs  Lab 04/14/2020 1417 04/30/20 1700  05/01/20 0512  AST 37  --   --   ALT 28  --   --   ALKPHOS 66  --   --   BILITOT 0.7  --   --   PROT 6.1*  --   --   ALBUMIN 2.4* 1.7* 1.6*   No results for input(s): LIPASE, AMYLASE in the last 168 hours. No results for input(s): AMMONIA in the last 168 hours.  ABG    Component Value Date/Time   PHART 7.291 (L) 05/01/2020 2043   PCO2ART 36.3 04/20/2020 2043   PO2ART 103 04/27/2020 2043   HCO3 17.9 (L) 04/26/2020 2043   TCO2 19 (L) 04/26/2020 2043   ACIDBASEDEF 8.0 (H) 04/17/2020 2043   O2SAT 98.0 04/23/2020 2043     Coagulation Profile: Recent Labs  Lab 04/27/2020 1417 04/26/20 0206  INR 2.2* 2.4*    Cardiac Enzymes: Recent Labs  Lab 04/28/20 1721  CKTOTAL 124    HbA1C: Hemoglobin A1C  Date/Time Value Ref Range Status  01/28/2020 10:58 AM 7.3 (A) 4.0 - 5.6 % Final  08/23/2016 12:00 AM 7.6  Final  04/05/2016 12:00 AM 7.0  Final   Hgb A1c MFr Bld  Date/Time Value Ref Range Status  04/26/2020 04:23 AM 9.8 (H) 4.8 - 5.6 % Final    Comment:    (NOTE) Pre diabetes:          5.7%-6.4%  Diabetes:              >6.4%  Glycemic control for   <7.0% adults with diabetes   07/16/2019 07:51 AM 8.3 (H) <5.7 % of total Hgb Final    Comment:    For someone without known diabetes, a hemoglobin A1c value of 6.5% or greater indicates that they may have  diabetes and this should be confirmed with a follow-up  test. . For someone with known diabetes, a value <7% indicates  that their diabetes is well controlled and a value  greater than or equal to 7% indicates suboptimal  control. A1c  targets should be individualized based on  duration of diabetes, age, comorbid conditions, and  other considerations. . Currently, no consensus exists regarding use of hemoglobin A1c for diagnosis of diabetes for children. .     CBG: Recent Labs  Lab 04/30/20 1924 04/30/20 2314 05/01/20 0332 05/01/20 0726 05/01/20 1122  GLUCAP 128* 151* 173* 146* 117*   BNP    Component  Value Date/Time   BNP 1,653.5 (H) 04/26/2020 1417   CRITICAL CARE Performed by: Spero Geralds  The patient is critically ill due to respiratory failure, renal failure, septic shock, acute ischemic strokes, encephalopathy.  They require high complexity decision making for assessment and support, frequent evaluation and titration of therapies, application of advanced monitoring technologies and extensive interpretation of multiple databases.   Critical Care Time devoted to patient care services described in this note is 37 minutes. This time reflects time of care of this [place] . This critical care time does not reflect separately billable procedures or procedure time, teaching time or supervisory time of PA/NP/Med student/Med Resident etc but could involve care discussion time.  Leone [facility] 05/01/2020 1:53 PM  Pager: see AMION After hours pager: [phone]  If no response to pager , please call [phone] until 7pm After 7:00 pm call Elink  [phone]

## 2020-05-01 NOTE — Progress Notes (Signed)
[facility] Progress Note  Name: Robert Daniel MRN: [id number] DOB: 1940/11/23  Chief Complaint:  weakness  Subjective:  Spoke with RN.  Seen and examined on CRRT; procedure supervised.  Had no uop documented over 2/24.  Had 4 kg UF over 2/24 with CRRT.  He has been following commands some on right per nursing. Doing better today.  Catheter blood flow was 100 overnight and we're trying 200 today - he had issues with coughing, etc yesterday and would alarm.he's on levo at 8 mcg/min.   Review of systems:   Unable to obtain 2/2 intubated   -------------- Background on consult:  Robert Daniel is a 80 y.o. male with a history of HTN, CKD, DM, and atrial fibrillation who presented with weakness and slurred speech, and left facial droop.  Found to have right MCA CVA and transferred to [facility].  He is s/p thrombectomy.  Course has been complicated by renal failure with hypotension, pause vs asystole per charting with spontaneous ROSC per charting.  Nephrology is consulted for hyperkalemia and volume overload - has been on lasix gtt augmented by metolazone.  Yesterday with 1.4 liters UOP over 2/21. Spoke to his sister, [name] via phone.  We discussed risks/benefits/indications for dialysis and she does consent to proceed with dialysis and dialysis catheter placement.  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  Critical care ok with HD.  Per nursing it's been harder to get him to follow commands today.    Intake/Output Summary (Last 24 hours) at 05/01/2020 0824 Last data filed at 05/01/2020 0800 Gross per 24 hour  Intake 3528.41 ml  Output 4490 ml  Net -961.59 ml    Vitals:  Vitals:   05/01/20 0600 05/01/20 0700 05/01/20 0736 05/01/20 0800  BP:   (!) 102/45   Pulse: 93 91 95 93  Resp: (!) 22 18 (!) 25 (!) 22  Temp:      TempSrc:  Oral    SpO2: 100% 100% 97% 100%  Weight:      Height:         Physical Exam:   General: elderly male intubated   HEENT: NCAT Neck: trachea midline; increased neck circumference  Heart: tachy S1S2 no rub Lungs: coarse breath sounds on vent 40 fi02/10 PEEP Abdomen: soft/obese/nt with sedation Extremities: 2+ edema diffusely Skin: no rash on extremities exposed Neuro: no sedation currently running Psych no agitation currently Access RIJ nontunneled catheter  Medications reviewed    Labs:  BMP Latest Ref Rng & Units 05/01/2020 04/30/2020 04/30/2020  Glucose 70 - 99 mg/dL 175(H) 112(H) 104(H)  BUN 8 - 23 mg/dL 83(H) 102(H) 103(H)  Creatinine 0.61 - 1.24 mg/dL 3.14(H) 3.83(H) 3.99(H)  BUN/Creat Ratio 10 - 24 - - -  Sodium 135 - 145 mmol/L 136 139 141  Potassium 3.5 - 5.1 mmol/L 5.4(H) 4.9 4.5  Chloride 98 - 111 mmol/L 101 104 103  CO2 22 - 32 mmol/L 21(L) 20(L) 22  Calcium 8.9 - 10.3 mg/dL 7.0(L) 6.9(L) 7.3(L)     Assessment/Plan:   # AKI - secondary to ischemic ATN and pre-renal insults - stroke, contrast, pause vs asystole reported.  Note presented with AKI from last baseline - note home meds include ACE inhibitor, ibuprofen, torsemide.  UA neg protein and > 50 RBC's noted. Up/cr ratio 1220 mg/g.  Renal US large kidneys no hydro.  Had HD on 2/23 first tx after nontunneled catheter.   - Continue CRRT. Goal net neg 50 to  100 ml/hr as tolerated.  Switch to 2K dialysate   # Hyperkalemia  - improved with RRT - switch to 2K dialysate  # Metabolic acidosis  - setting of AKI - on RRT  # CVA - acute - s/p thrombectomy - per primary team   # Staph aureus bacteremia - MSSA - nontunneled catheter just placed - abx per primary team  # septic shock  - he's on levo  - abx per primary    # Anemia acute illness  - no ESA setting of acute CVA; iron deficient - PRBC's for goal of 7 (though 8 preferred given new AKI) - start oral iron; defer IV with sepsis - CBC in am  # CKD stage 3 - previous baseline Cr 1.4 last fall - presented with AKI from that baseline  # UTI - staph aureus; abx  per primary team   # Hyperphoshatemia  - follow with CRRT; improving  Claudia Desanctis, MD 05/01/2020  8:38 AM

## 2020-05-02 ENCOUNTER — Inpatient Hospital Stay (HOSPITAL_COMMUNITY): Payer: PPO

## 2020-05-02 DIAGNOSIS — I63411 Cerebral infarction due to embolism of right middle cerebral artery: Secondary | ICD-10-CM | POA: Diagnosis not present

## 2020-05-02 DIAGNOSIS — N179 Acute kidney failure, unspecified: Secondary | ICD-10-CM | POA: Diagnosis not present

## 2020-05-02 DIAGNOSIS — A419 Sepsis, unspecified organism: Secondary | ICD-10-CM | POA: Diagnosis not present

## 2020-05-02 DIAGNOSIS — G9341 Metabolic encephalopathy: Secondary | ICD-10-CM

## 2020-05-02 LAB — RENAL FUNCTION PANEL
Albumin: 1.6 g/dL — ABNORMAL LOW (ref 3.5–5.0)
Anion gap: 23 — ABNORMAL HIGH (ref 5–15)
BUN: 57 mg/dL — ABNORMAL HIGH (ref 8–23)
CO2: 13 mmol/L — ABNORMAL LOW (ref 22–32)
Calcium: 6.5 mg/dL — ABNORMAL LOW (ref 8.9–10.3)
Chloride: 100 mmol/L (ref 98–111)
Creatinine, Ser: 2.67 mg/dL — ABNORMAL HIGH (ref 0.61–1.24)
GFR, Estimated: 23 mL/min — ABNORMAL LOW (ref >60)
Glucose, Bld: 84 mg/dL (ref 70–99)
Phosphorus: 9.9 mg/dL — ABNORMAL HIGH (ref 2.5–4.6)
Potassium: 5.7 mmol/L — ABNORMAL HIGH (ref 3.5–5.1)
Sodium: 136 mmol/L (ref 135–145)

## 2020-05-02 LAB — HEPATIC FUNCTION PANEL
ALT: 4611 U/L — ABNORMAL HIGH (ref 0–44)
AST: >10000 U/L — ABNORMAL HIGH (ref 15–41)
Albumin: 1.6 g/dL — ABNORMAL LOW (ref 3.5–5.0)
Alkaline Phosphatase: 293 U/L — ABNORMAL HIGH (ref 38–126)
Bilirubin, Direct: 1.4 mg/dL — ABNORMAL HIGH (ref 0.0–0.2)
Indirect Bilirubin: 1.8 mg/dL — ABNORMAL HIGH (ref 0.3–0.9)
Total Bilirubin: 3.2 mg/dL — ABNORMAL HIGH (ref 0.3–1.2)
Total Protein: 4.7 g/dL — ABNORMAL LOW (ref 6.5–8.1)

## 2020-05-02 LAB — POCT I-STAT 7, (LYTES, BLD GAS, ICA,H+H)
Acid-base deficit: 13 mmol/L — ABNORMAL HIGH (ref 0.0–2.0)
Acid-base deficit: 17 mmol/L — ABNORMAL HIGH (ref 0.0–2.0)
Acid-base deficit: 15 mmol/L — ABNORMAL HIGH (ref 0.0–2.0)
Bicarbonate: 13.7 mmol/L — ABNORMAL LOW (ref 20.0–28.0)
Bicarbonate: 12.5 mmol/L — ABNORMAL LOW (ref 20.0–28.0)
Bicarbonate: 13.1 mmol/L — ABNORMAL LOW (ref 20.0–28.0)
Calcium, Ion: 0.88 mmol/L — CL (ref 1.15–1.40)
Calcium, Ion: 0.86 mmol/L — CL (ref 1.15–1.40)
Calcium, Ion: 0.78 mmol/L — CL (ref 1.15–1.40)
HCT: 27 % — ABNORMAL LOW (ref 39.0–52.0)
HCT: 27 % — ABNORMAL LOW (ref 39.0–52.0)
HCT: 24 % — ABNORMAL LOW (ref 39.0–52.0)
Hemoglobin: 9.2 g/dL — ABNORMAL LOW (ref 13.0–17.0)
Hemoglobin: 9.2 g/dL — ABNORMAL LOW (ref 13.0–17.0)
Hemoglobin: 8.2 g/dL — ABNORMAL LOW (ref 13.0–17.0)
O2 Saturation: 96 %
O2 Saturation: 93 %
O2 Saturation: 96 %
Patient temperature: 96
Patient temperature: 97.49
Patient temperature: 96.2
Potassium: 5.6 mmol/L — ABNORMAL HIGH (ref 3.5–5.1)
Potassium: 5.3 mmol/L — ABNORMAL HIGH (ref 3.5–5.1)
Potassium: 5.2 mmol/L — ABNORMAL HIGH (ref 3.5–5.1)
Sodium: 134 mmol/L — ABNORMAL LOW (ref 135–145)
Sodium: 135 mmol/L (ref 135–145)
Sodium: 137 mmol/L (ref 135–145)
TCO2: 15 mmol/L — ABNORMAL LOW (ref 22–32)
TCO2: 14 mmol/L — ABNORMAL LOW (ref 22–32)
TCO2: 14 mmol/L — ABNORMAL LOW (ref 22–32)
pCO2 arterial: 33.8 mmHg (ref 32.0–48.0)
pCO2 arterial: 41.2 mmHg (ref 32.0–48.0)
pCO2 arterial: 38.2 mmHg (ref 32.0–48.0)
pH, Arterial: 7.209 — ABNORMAL LOW (ref 7.350–7.450)
pH, Arterial: 7.087 — CL (ref 7.350–7.450)
pH, Arterial: 7.135 — CL (ref 7.350–7.450)
pO2, Arterial: 89 mmHg (ref 83.0–108.0)
pO2, Arterial: 89 mmHg (ref 83.0–108.0)
pO2, Arterial: 96 mmHg (ref 83.0–108.0)

## 2020-05-02 LAB — PROTIME-INR
INR: 3 — ABNORMAL HIGH (ref 0.8–1.2)
Prothrombin Time: 29.8 seconds — ABNORMAL HIGH (ref 11.4–15.2)

## 2020-05-02 LAB — CBC
HCT: 27.4 % — ABNORMAL LOW (ref 39.0–52.0)
Hemoglobin: 8.2 g/dL — ABNORMAL LOW (ref 13.0–17.0)
MCH: 28.3 pg (ref 26.0–34.0)
MCHC: 29.9 g/dL — ABNORMAL LOW (ref 30.0–36.0)
MCV: 94.5 fL (ref 80.0–100.0)
Platelets: 97 10*3/uL — ABNORMAL LOW (ref 150–400)
RBC: 2.9 MIL/uL — ABNORMAL LOW (ref 4.22–5.81)
RDW: 17 % — ABNORMAL HIGH (ref 11.5–15.5)
WBC: 17.5 10*3/uL — ABNORMAL HIGH (ref 4.0–10.5)
nRBC: 5.7 % — ABNORMAL HIGH (ref 0.0–0.2)

## 2020-05-02 LAB — GLUCOSE, CAPILLARY
Glucose-Capillary: 38 mg/dL — CL (ref 70–99)
Glucose-Capillary: 108 mg/dL — ABNORMAL HIGH (ref 70–99)
Glucose-Capillary: 124 mg/dL — ABNORMAL HIGH (ref 70–99)
Glucose-Capillary: 65 mg/dL — ABNORMAL LOW (ref 70–99)
Glucose-Capillary: 62 mg/dL — ABNORMAL LOW (ref 70–99)
Glucose-Capillary: 59 mg/dL — ABNORMAL LOW (ref 70–99)

## 2020-05-02 LAB — TROPONIN I (HIGH SENSITIVITY)
Troponin I (High Sensitivity): 445 ng/L (ref <18)
Troponin I (High Sensitivity): 427 ng/L (ref <18)

## 2020-05-02 LAB — LACTIC ACID, PLASMA
Lactic Acid, Venous: >11 mmol/L (ref 0.5–1.9)
Lactic Acid, Venous: >11 mmol/L (ref 0.5–1.9)

## 2020-05-02 LAB — MAGNESIUM: Magnesium: 3 mg/dL — ABNORMAL HIGH (ref 1.7–2.4)

## 2020-05-02 IMAGING — DX DG CHEST 1V PORT
2 series · 2 of 2 positions shown · non-contrast
Comparison: [DATE]

CLINICAL DATA: Hypotension

EXAM:
PORTABLE CHEST 1 VIEW

[chest ap (1 of 2)]
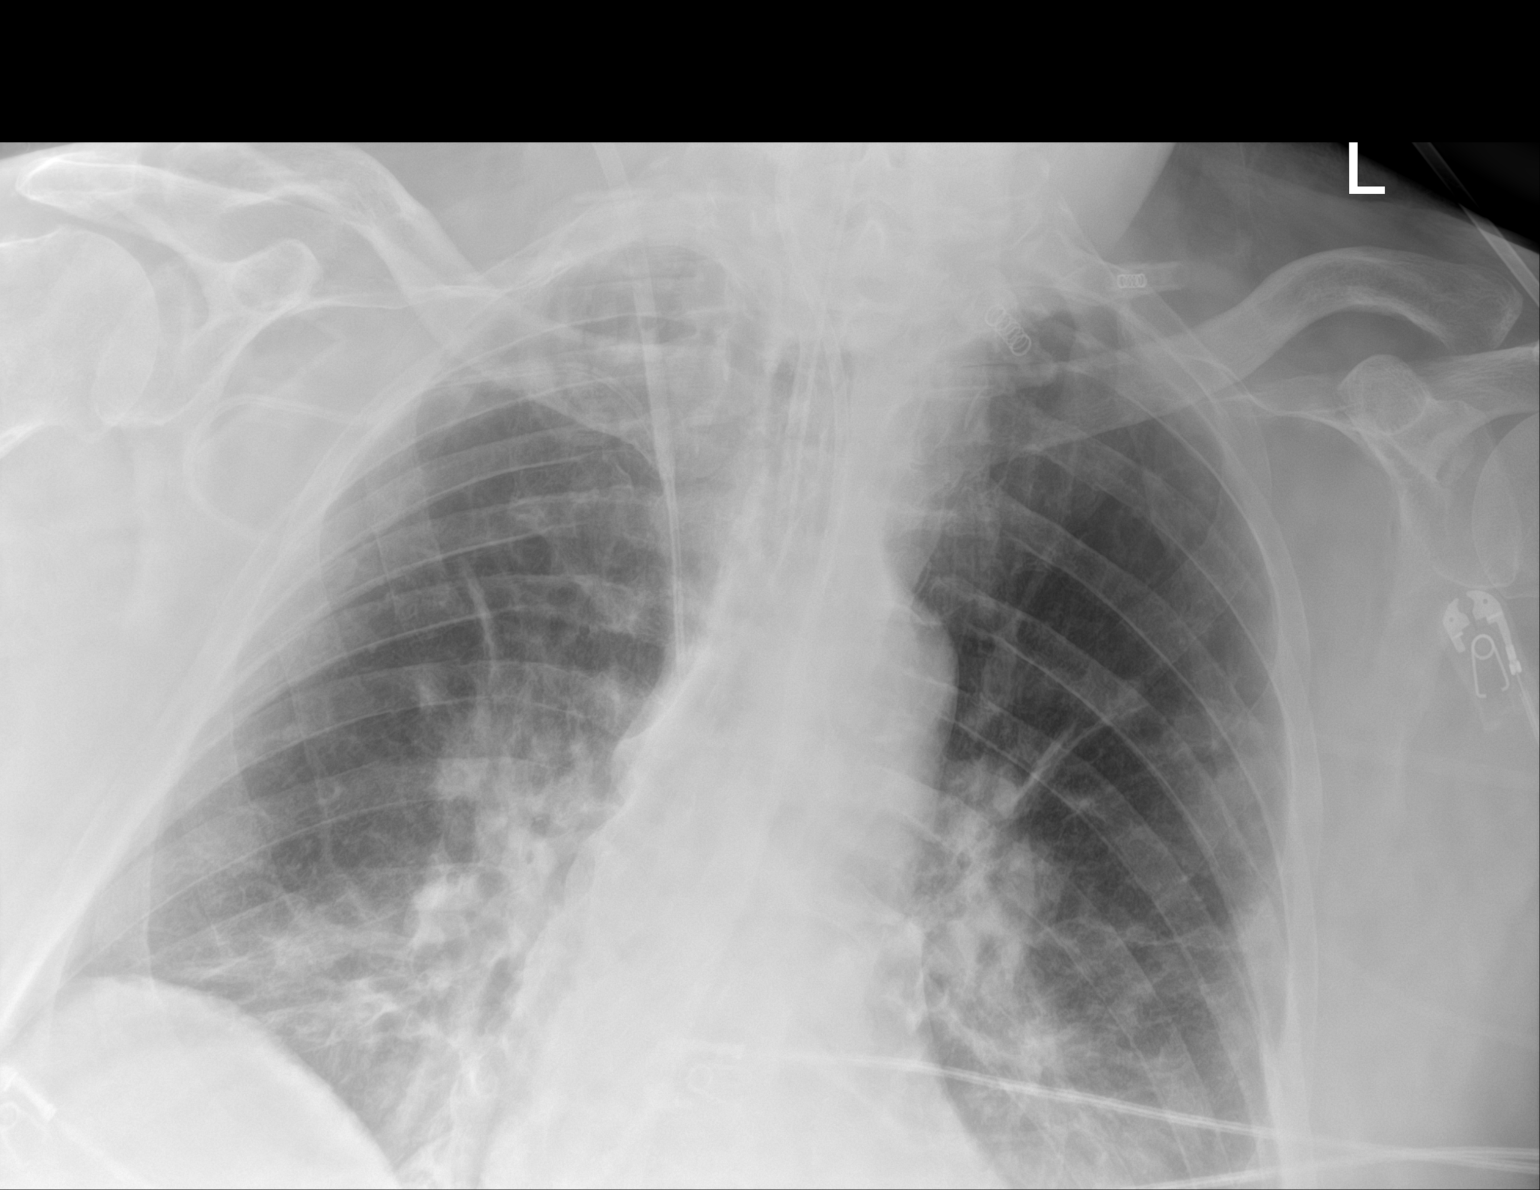

[chest ap (2 of 2)]
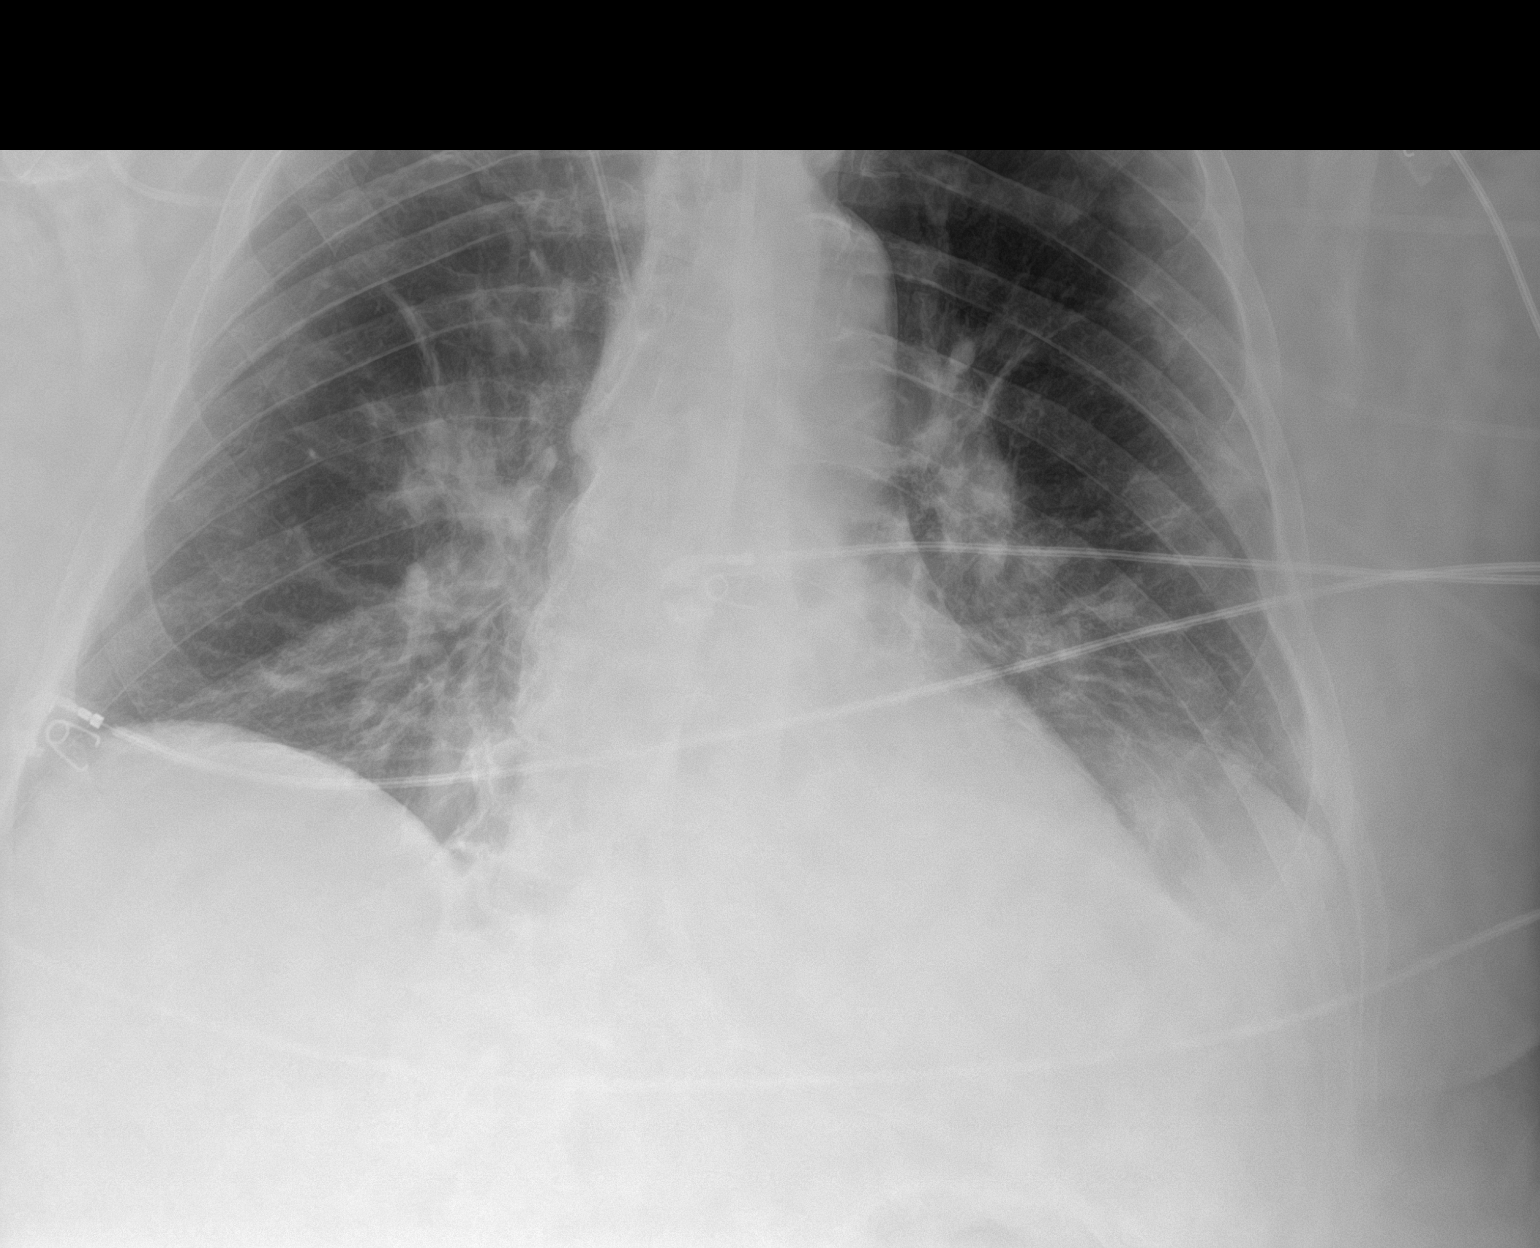

[2 of 2 positions shown; findings below may reference images not displayed]

FINDINGS: Endotracheal tube 6.2 cm above the carina, nasoenteric feeding tube
extending into the upper abdomen beyond the margin of the
examination, right internal jugular Cordis introducer, and right
subclavian central venous catheter with its tip within the superior
vena cava are all unchanged.

Developing bilateral lower lobe collapse. Nodular infiltrate
throughout the left upper and mid lung zones has progressed slightly
in the interval since prior examination, possibly infectious or
inflammatory in the acute setting. Central pulmonary arterial
enlargement contributes to bilateral hilar enlargement. Cardiac size
within normal limits. Pulmonary vasculature is otherwise
unremarkable.
IMPRESSION: Stable support lines and tubes.

Developing bibasilar atelectasis.

Increasing nodular infiltrate within the left lung, possibly
infectious or inflammatory.

Central pulmonary arterial enlargement contributing to bilateral
hilar enlargement.

## 2020-05-02 MED ORDER — SODIUM BICARBONATE 8.4 % IV SOLN
INTRAVENOUS | Status: DC
  Filled 2020-05-02 (×3): qty 850

## 2020-05-02 MED ORDER — SODIUM CHLORIDE 0.9 % IV BOLUS
1000.0000 mL | Freq: Once | INTRAVENOUS | Status: AC
  Administered 2020-05-02: 1000 mL via INTRAVENOUS

## 2020-05-02 MED ORDER — PHENYLEPHRINE HCL-NACL 10-0.9 MG/250ML-% IV SOLN
0.0000 ug/min | INTRAVENOUS | Status: DC
  Administered 2020-05-02: 20 ug/min via INTRAVENOUS
  Filled 2020-05-02: qty 250

## 2020-05-02 MED ORDER — SODIUM BICARBONATE 8.4 % IV SOLN
50.0000 meq | Freq: Once | INTRAVENOUS | Status: AC
  Administered 2020-05-02: 50 meq via INTRAVENOUS
  Filled 2020-05-02: qty 50

## 2020-05-02 MED ORDER — ACETAMINOPHEN 650 MG RE SUPP: 650.0000 mg | Freq: Four times a day (QID) | RECTAL | Status: DC | PRN

## 2020-05-02 MED ORDER — SODIUM BICARBONATE 8.4 % IV SOLN
100.0000 meq | Freq: Once | INTRAVENOUS | Status: AC
  Administered 2020-05-02: 100 meq via INTRAVENOUS

## 2020-05-02 MED ORDER — HYDROCORTISONE NA SUCCINATE PF 100 MG IJ SOLR
50.0000 mg | Freq: Four times a day (QID) | INTRAMUSCULAR | Status: DC
  Administered 2020-05-02: 50 mg via INTRAVENOUS
  Filled 2020-05-02: qty 2

## 2020-05-02 MED ORDER — POLYVINYL ALCOHOL 1.4 % OP SOLN
1.0000 [drp] | Freq: Four times a day (QID) | OPHTHALMIC | Status: DC | PRN
Start: 2020-05-02 — End: 2020-05-02
  Filled 2020-05-02: qty 15

## 2020-05-02 MED ORDER — DEXTROSE 10 % IV SOLN: INTRAVENOUS | Status: DC

## 2020-05-02 MED ORDER — GLYCOPYRROLATE 1 MG PO TABS
1.0000 mg | ORAL_TABLET | ORAL | Status: DC | PRN
  Filled 2020-05-02: qty 1

## 2020-05-02 MED ORDER — DEXTROSE 50 % IV SOLN
INTRAVENOUS | Status: AC
  Filled 2020-05-02: qty 50

## 2020-05-02 MED ORDER — SODIUM BICARBONATE 8.4 % IV SOLN
INTRAVENOUS | Status: AC
  Filled 2020-05-02: qty 100

## 2020-05-02 MED ORDER — ACETAMINOPHEN 325 MG PO TABS: 650.0000 mg | ORAL_TABLET | Freq: Four times a day (QID) | ORAL | Status: DC | PRN

## 2020-05-02 MED ORDER — DEXTROSE 50 % IV SOLN
1.0000 | Freq: Once | INTRAVENOUS | Status: AC
  Administered 2020-05-02: 50 mL via INTRAVENOUS

## 2020-05-02 MED ORDER — SODIUM BICARBONATE 8.4 % IV SOLN
INTRAVENOUS | Status: AC
  Filled 2020-05-02: qty 200

## 2020-05-02 MED ORDER — GLYCOPYRROLATE 0.2 MG/ML IJ SOLN: 0.2000 mg | INTRAMUSCULAR | Status: DC | PRN

## 2020-05-02 MED ORDER — SODIUM BICARBONATE 8.4 % IV SOLN
200.0000 meq | Freq: Once | INTRAVENOUS | Status: AC
  Administered 2020-05-02: 200 meq via INTRAVENOUS

## 2020-05-02 MED ORDER — MORPHINE BOLUS VIA INFUSION
5.0000 mg | INTRAVENOUS | Status: DC | PRN
Start: 2020-05-02 — End: 2020-05-02
  Filled 2020-05-02: qty 5

## 2020-05-02 MED ORDER — MORPHINE 100MG IN NS 100ML (1MG/ML) PREMIX INFUSION
0.0000 mg/h | INTRAVENOUS | Status: DC
  Administered 2020-05-02: 2 mg/h via INTRAVENOUS
  Filled 2020-05-02: qty 100

## 2020-05-02 MED ORDER — MORPHINE SULFATE (PF) 2 MG/ML IV SOLN: 2.0000 mg | INTRAVENOUS | Status: DC | PRN

## 2020-05-02 MED ORDER — DEXTROSE 50 % IV SOLN
12.5000 g | Freq: Once | INTRAVENOUS | Status: AC
  Administered 2020-05-02: 12.5 g via INTRAVENOUS

## 2020-05-02 MED ORDER — DIPHENHYDRAMINE HCL 50 MG/ML IJ SOLN: 25.0000 mg | INTRAMUSCULAR | Status: DC | PRN

## 2020-05-02 MED ORDER — DEXTROSE 50 % IV SOLN
25.0000 g | Freq: Once | INTRAVENOUS | Status: AC
  Administered 2020-05-02: 25 g via INTRAVENOUS

## 2020-05-02 MED ORDER — EPINEPHRINE HCL 5 MG/250ML IV SOLN IN NS
0.5000 ug/min | INTRAVENOUS | Status: DC
  Administered 2020-05-02: 0.5 ug/min via INTRAVENOUS
  Filled 2020-05-02 (×2): qty 250

## 2020-05-04 LAB — GLUCOSE, CAPILLARY: Glucose-Capillary: 23 mg/dL — CL (ref 70–99)

## 2020-05-05 LAB — CULTURE, BLOOD (ROUTINE X 2)
Culture: NO GROWTH
Culture: NO GROWTH
Special Requests: ADEQUATE
Special Requests: ADEQUATE

## 2020-05-05 NOTE — Plan of Care (Signed)
Patient has had heightened pressor requirements over the night, now maxxed on levophed 75 and vasopressin 0.03.  Also has been struggling with hypoglycemia overnight.  This hospitalization, patient was receiving CRRT for AKI and volume overload, is mechanically ventillated.  Is s/p thrombectomy for stroke, and has MSSA tricuspid valve endocarditis on nafcillin.  Bedside POCUS exam showed bilateral lung sliding, heart showed lower EF in the LV with hypokinesis of apex, and septal wall.  RV function looked similar to prior likely.  Moderate air-trapping on the vent with peak pressures of 36 and total PEEP of 15 (on PEEP of 10).    I wonder if patient had an MI, and now has heart failure, or if he has gut ischemia that was precipitated by hypotension.  No obvious signs of new infection.  I worry about now hepatic compromise in light of his hypoglycemia.  Has worsening metabolic acidosis.    Plan: - d/c neo, start epi (in light of poor LV function).  Keep levo and vaso - troponin, INR, hepatic function panel, lactate all added to AM labs drawn now - CXR- nonsignificant - EKG: no significant ST changes, no STEMI - add stress dose steroids - continue frequent glucose checks.  May need to add d10.     CODE STATUS CHANGE:  I spoke with patient's sister Eual Fines about his deterioration, and how we are working to find reversible causes, but he is in multiorgan failure and has an overall very poor chance for survival.  Informed her that if/when his heart would stop beating, resuscitation with CPR would not work in light of his underlying organ dysfunction.  She understands and would like [place] to keep trying to keep him alive and look for reversible causes, but if he were to pass away, to pass away peacefully.  She has changed code status to DNR.    Code status now: DNR

## 2020-05-05 NOTE — Death Summary Note (Signed)
DEATH SUMMARY   Patient Details  Name: Robert Daniel MRN: [id number] DOB: 1941/02/29  Admission/Discharge Information   Admit Date:  05-21-2020  Date of Death: Date of Death: 05/28/2020  Time of Death: Time of Death: 0901  Length of Stay: 06/07/2022  Referring Physician: Olin Hauser, DO   Reason(s) for Hospitalization  Acute ischemic stroke  Diagnoses  Preliminary cause of death: septic shock secondary to MSSA bacteremia from bacterial endocarditis Secondary Diagnoses (including complications and co-morbidities):  Active Problems:   Stroke (cerebrum) (HCC)   AKI (acute kidney injury) ([place])   Encounter for central line placement   Pressure injury of skin   Brief Hospital Course (including significant findings, care, treatment, and services provided and events leading to death)  [name] was a 80 y.o. year old male who had a PMH Afib on xarelto, HTN, DM2, rpesented to [facility] 21-May-2022 with weakness and slurred speech. Sx began 2/18, but resolved, until 2022/05/21 approx noon when he had L arm weakness, slurred speech, L facial droop. Last took xarelto 2/18 evening. Found to have R MCA CVA Transferred to [facility], and taken urgently to NIR for mechanical thrombectomy of proximal R M2/MCA occlusion, with subsequent reocclusion x 3, followed by intracranial stent deployment with TICI3 recanalization. NIR course also c/b pt instability-- with hypotension,  pause vs asystole with spontaneous ROSC following a prop bolus. He received 2 PRBC in NIR for hypotension; as well as albumin and neo.  Transferred to the ICU for ongoing care. Over the course of the next 5 days he had progression of his infarcts and was found to have MSSA bacteremia with a TEE confirming tricuspid valve endocarditis. His mental status did not improve and he developed ongoing signs of thromboembolic disease. He progressed to renal failure and was started on CRRT. Unfortunately the night of 2/25 developed worsening acidosis  refractory to CRRT and progressive shock. Was made DNR by his sister and she was able to see him over facetime before transitioning to comfort measures on 2/26/212.   Pertinent Labs and Studies  Significant Diagnostic Studies EEG  Result Date: 04/29/2020 Lora Havens, MD     04/29/2020  5:02 PM Patient Name: Robert Daniel MRN: [id number] Epilepsy Attending: Lora Havens Referring Physician/Provider: Dr Lesleigh Noe Date: 04/29/2020 Duration: 32.42 mins Patient history: 80 year old male with worsening mental status.  EEG to evaluate for seizures. Level of alertness:  comatose AEDs during EEG study: None Technical aspects: This EEG study was done with scalp electrodes positioned according to the 10-20 International system of electrode placement. Electrical activity was acquired at a sampling rate of '500Hz'  and reviewed with a high frequency filter of '70Hz'  and a low frequency filter of '1Hz' . EEG data were recorded continuously and digitally stored. Description: EEG showed continuous 5 to 6 Hz theta slowing in left hemisphere as well as 2-'3Hz'  low amplitude delta slowing in right hemisphere.  Hyperventilation and photic stimulation were not performed.   ABNORMALITY -Continuous slow, generalized and lateralized right hemisphere IMPRESSION: This study is suggestive of cortical dysfunction in right hemisphere due to underlying structural abnormality as well as moderate to severe diffuse encephalopathy, nonspecific etiology. No seizures or epileptiform discharges were seen throughout the recording. Lora Havens   DG Chest 1 View  Result Date: 2020-05-21 CLINICAL DATA:  Acute shortness of breath.  CVA. EXAM: CHEST  1 VIEW COMPARISON:  07/08/2019 chest radiograph FINDINGS: This is a low volume study. The cardiomediastinal silhouette is unchanged. Mild pulmonary vascular congestion noted. There  is no evidence of focal airspace disease, pulmonary edema, suspicious pulmonary nodule/mass, pleural effusion, or  pneumothorax. No acute bony abnormalities are identified. IMPRESSION: Mild pulmonary vascular congestion. Electronically Signed   By: Margarette Canada M.D.   On: 04/26/2020 15:02   CT HEAD WO CONTRAST  Result Date: 04/28/2020 CLINICAL DATA:  Altered mental status. EXAM: CT HEAD WITHOUT CONTRAST TECHNIQUE: Contiguous axial images were obtained from the base of the skull through the vertex without intravenous contrast. COMPARISON:  MRI April 26, 2020.  CT exams April 25, 2020. FINDINGS: Brain: Scattered cortical and subcortical areas of hypoattenuation within the right MCA distribution, compatible with evolving infarcts. No progressive mass effect or midline shift. Subtle cortical versus sulcal hyperdensity along the high right posterior frontal convexity may represent petechial hemorrhage versus trace subarachnoid hemorrhage. No mass occupying hemorrhagic transformation. No mass lesion. Additional mild patchy white matter hypoattenuation, likely related to chronic microvascular ischemic disease. Vascular: Right M2 MCA stent.  Calcific atherosclerosis. Skull: No acute fracture. Sinuses/Orbits: Scattered paranasal sinus mucosal thickening. Other: No mastoid effusions. IMPRESSION: 1. Scattered cortical and subcortical areas of hypoattenuation within the right MCA distribution, compatible with evolving infarcts. No progressive mass effect. If there is concern for expansion of known infarcts, an MRI could better evaluate. 2. Subtle cortical versus sulcal hyperdensity along the high right posterior frontal convexity may represent petechial hemorrhage versus trace subarachnoid hemorrhage. No mass occupying hemorrhagic transformation. 3. Paranasal sinus mucosal thickening. These results will be called to the ordering clinician or representative by the Radiologist Assistant, and communication documented in the PACS or [employer]. Electronically Signed   By: Margaretha Sheffield MD   On: 04/28/2020 21:24   MR ANGIO  HEAD WO CONTRAST  Result Date: 04/29/2020 CLINICAL DATA:  80 year old male with altered mental status. Scattered infarcts in the right hemisphere, occasionally in the left hemisphere on 04/26/2020. History of right MCA stent. Questionable petechial or subarachnoid hemorrhage along the right superior convexity on head CT yesterday. EXAM: MRI HEAD WITHOUT CONTRAST MRA HEAD WITHOUT CONTRAST TECHNIQUE: Multiplanar, multiecho pulse sequences of the brain and surrounding structures were obtained without intravenous contrast. Angiographic images of the head were obtained using MRA technique without contrast. COMPARISON:  Brain MRI and MRA 04/26/2020.  Head CT yesterday. FINDINGS: MRI HEAD FINDINGS Brain: Increased size and confluence of multifocal restricted diffusion, now moderately affecting the right hemisphere in both right MCA and right PCA vascular territories. Confluent right corona radiata and frontal operculum progression (series 2, image 34). Similar increased conspicuity of small foci along the right internal capsule. More extensive cortical involvement elsewhere including at the left occipital pole, junction of the right temporal and occipital lobes. A few small scattered areas of cortical restricted diffusion in the left superior and posterior frontal lobe are also mildly increased (e.g. Series 2, image 40). There is also a small round new focus of restricted diffusion in the central midbrain to the right (series 2, image 21). And furthermore increased conspicuity of a similar small focus of restriction in the left cerebellar vermis (image 12). No other new areas of involvement identified. Progressed cytotoxic edema associated with the above. Mild new mass effect on the right lateral ventricle. No midline shift. Basilar cisterns remain patent. There is punctate but increased petechial or subarachnoid hemorrhage along the right central sulcus as suspected by CT (series 8, image 70 on SWI). Stable several  chronic microhemorrhages in the opposite left frontal lobe. Susceptibility artifact related to posterior right MCA branch stent. No intraventricular hemorrhage  or ventriculomegaly. Cervicomedullary junction and pituitary are within normal limits. Vascular: Major intracranial vascular flow voids remain stable. Skull and upper cervical spine: Stable, negative for age. Sinuses/Orbits: Stable and negative orbits. Fluid in the nasopharynx with right nasoenteric tube in place now. Stable fluid layering in the sphenoid sinuses. Mild paranasal sinus mucosal thickening elsewhere. New left greater than right mastoid fluid. Other: None. MRA HEAD FINDINGS Source images demonstrate stable antegrade flow signal compared to 04/26/20 throughout the anterior and posterior circulation. Diminutive vertebrobasilar system, somewhat dominant left vertebral artery. Less of the V4 segment is visible today. Distal vertebral and basilar artery irregularity but only mild associated stenosis. Basilar functionally terminates in the SCA is. Fetal type bilateral PCA origins and bilateral PCAs appears stable and within normal limits. Stable ICA siphons, mild tortuosity and irregularity without ICA stenosis. Ophthalmic and posterior communicating artery origins remain patent. Patent carotid termini. ACAs are stable and within normal limits, left A1 mildly dominant. Normal anterior communicating artery. Left MCA M1 segment, left MCA bifurcation and visible left MCA branches are stable and within normal limits. Right MCA M1 remains patent. Patent right MCA bifurcation and proximal M2 branches. Right MCA M2 vascular stent susceptibility and visible right MCA branches appear stable. IMPRESSION: 1. Progressed size and extent of infarcts widely scattered in the right MCA and PCA territories since 04/26/2020. Associated petechial hemorrhage as suspected by CT. No malignant hemorrhagic transformation. Mild mass effect with no midline shift at this time. 2.  Small new lacunar type infarcts in the left cerebellar vermis and right midbrain. Several small left MCA territory cortical infarcts again noted. 3. Stable intracranial MRA from 2 days ago. No large vessel occlusion. Stable MRA appearance of right MCA M2 stent. Electronically Signed   By: Genevie Ann M.D.   On: 04/29/2020 09:07   MR ANGIO HEAD WO CONTRAST  Result Date: 04/26/2020 CLINICAL DATA:  Stroke follow-up. EXAM: MRI HEAD WITHOUT CONTRAST MRA HEAD WITHOUT CONTRAST TECHNIQUE: Multiplanar, multiecho pulse sequences of the brain and surrounding structures were obtained without intravenous contrast. Angiographic images of the head were obtained using MRA technique without contrast. COMPARISON:  CT a of the head neck from yesterday FINDINGS: MRI HEAD FINDINGS Brain: Patchy cortical infarction along the right MCA territory and reaching the right occipital cortex in this patient with large right posterior communicating artery. Two tiny cortical infarcts in the left MCA to distal ACA territory. No hemorrhage, mass effect, or hydrocephalus. Age normal brain volume. Mild pre-existing ischemic disease for age. Vascular: Arterial findings below. Skull and upper cervical spine: Normal marrow signal Sinuses/Orbits: Generalized mucosal thickening with fluid levels. MRA HEAD FINDINGS Right M2 stenting with expected associated artifact but visible internal flow on source images. On 3D reformats there is apparent high-grade stenosis with flow gap in the region of the M2 takeoff, but this is likely primarily from artifact based on source images. There is moderate atheromatous irregularity of bilateral MCA branches. Diffuse atheromatous irregularity of the left V4 segment and basilar with overall moderate stenosis except at the vertebrobasilar junction where narrowing appears high-grade. The right V4 segment is not seen until the patent PICA. IMPRESSION: 1. Scattered acute cortical infarcts in the right MCA territory and right  occipital lobe. Two tiny cortical infarcts in the left cerebral hemisphere. 2. Present flow in the stented right M2 branch. 3. Advanced intracranial atherosclerosis as previously characterized. 4. Sinus inflammation. Electronically Signed   By: Monte Fantasia M.D.   On: 04/26/2020 04:34   MR BRAIN WO  CONTRAST  Result Date: 04/29/2020 CLINICAL DATA:  80 year old male with altered mental status. Scattered infarcts in the right hemisphere, occasionally in the left hemisphere on 04/26/2020. History of right MCA stent. Questionable petechial or subarachnoid hemorrhage along the right superior convexity on head CT yesterday. EXAM: MRI HEAD WITHOUT CONTRAST MRA HEAD WITHOUT CONTRAST TECHNIQUE: Multiplanar, multiecho pulse sequences of the brain and surrounding structures were obtained without intravenous contrast. Angiographic images of the head were obtained using MRA technique without contrast. COMPARISON:  Brain MRI and MRA 04/26/2020.  Head CT yesterday. FINDINGS: MRI HEAD FINDINGS Brain: Increased size and confluence of multifocal restricted diffusion, now moderately affecting the right hemisphere in both right MCA and right PCA vascular territories. Confluent right corona radiata and frontal operculum progression (series 2, image 34). Similar increased conspicuity of small foci along the right internal capsule. More extensive cortical involvement elsewhere including at the left occipital pole, junction of the right temporal and occipital lobes. A few small scattered areas of cortical restricted diffusion in the left superior and posterior frontal lobe are also mildly increased (e.g. Series 2, image 40). There is also a small round new focus of restricted diffusion in the central midbrain to the right (series 2, image 21). And furthermore increased conspicuity of a similar small focus of restriction in the left cerebellar vermis (image 12). No other new areas of involvement identified. Progressed cytotoxic edema  associated with the above. Mild new mass effect on the right lateral ventricle. No midline shift. Basilar cisterns remain patent. There is punctate but increased petechial or subarachnoid hemorrhage along the right central sulcus as suspected by CT (series 8, image 70 on SWI). Stable several chronic microhemorrhages in the opposite left frontal lobe. Susceptibility artifact related to posterior right MCA branch stent. No intraventricular hemorrhage or ventriculomegaly. Cervicomedullary junction and pituitary are within normal limits. Vascular: Major intracranial vascular flow voids remain stable. Skull and upper cervical spine: Stable, negative for age. Sinuses/Orbits: Stable and negative orbits. Fluid in the nasopharynx with right nasoenteric tube in place now. Stable fluid layering in the sphenoid sinuses. Mild paranasal sinus mucosal thickening elsewhere. New left greater than right mastoid fluid. Other: None. MRA HEAD FINDINGS Source images demonstrate stable antegrade flow signal compared to 04/26/20 throughout the anterior and posterior circulation. Diminutive vertebrobasilar system, somewhat dominant left vertebral artery. Less of the V4 segment is visible today. Distal vertebral and basilar artery irregularity but only mild associated stenosis. Basilar functionally terminates in the SCA is. Fetal type bilateral PCA origins and bilateral PCAs appears stable and within normal limits. Stable ICA siphons, mild tortuosity and irregularity without ICA stenosis. Ophthalmic and posterior communicating artery origins remain patent. Patent carotid termini. ACAs are stable and within normal limits, left A1 mildly dominant. Normal anterior communicating artery. Left MCA M1 segment, left MCA bifurcation and visible left MCA branches are stable and within normal limits. Right MCA M1 remains patent. Patent right MCA bifurcation and proximal M2 branches. Right MCA M2 vascular stent susceptibility and visible right MCA  branches appear stable. IMPRESSION: 1. Progressed size and extent of infarcts widely scattered in the right MCA and PCA territories since 04/26/2020. Associated petechial hemorrhage as suspected by CT. No malignant hemorrhagic transformation. Mild mass effect with no midline shift at this time. 2. Small new lacunar type infarcts in the left cerebellar vermis and right midbrain. Several small left MCA territory cortical infarcts again noted. 3. Stable intracranial MRA from 2 days ago. No large vessel occlusion. Stable MRA appearance of right  MCA M2 stent. Electronically Signed   By: Genevie Ann M.D.   On: 04/29/2020 09:07   MR BRAIN WO CONTRAST  Result Date: 04/26/2020 CLINICAL DATA:  Stroke follow-up. EXAM: MRI HEAD WITHOUT CONTRAST MRA HEAD WITHOUT CONTRAST TECHNIQUE: Multiplanar, multiecho pulse sequences of the brain and surrounding structures were obtained without intravenous contrast. Angiographic images of the head were obtained using MRA technique without contrast. COMPARISON:  CT a of the head neck from yesterday FINDINGS: MRI HEAD FINDINGS Brain: Patchy cortical infarction along the right MCA territory and reaching the right occipital cortex in this patient with large right posterior communicating artery. Two tiny cortical infarcts in the left MCA to distal ACA territory. No hemorrhage, mass effect, or hydrocephalus. Age normal brain volume. Mild pre-existing ischemic disease for age. Vascular: Arterial findings below. Skull and upper cervical spine: Normal marrow signal Sinuses/Orbits: Generalized mucosal thickening with fluid levels. MRA HEAD FINDINGS Right M2 stenting with expected associated artifact but visible internal flow on source images. On 3D reformats there is apparent high-grade stenosis with flow gap in the region of the M2 takeoff, but this is likely primarily from artifact based on source images. There is moderate atheromatous irregularity of bilateral MCA branches. Diffuse atheromatous  irregularity of the left V4 segment and basilar with overall moderate stenosis except at the vertebrobasilar junction where narrowing appears high-grade. The right V4 segment is not seen until the patent PICA. IMPRESSION: 1. Scattered acute cortical infarcts in the right MCA territory and right occipital lobe. Two tiny cortical infarcts in the left cerebral hemisphere. 2. Present flow in the stented right M2 branch. 3. Advanced intracranial atherosclerosis as previously characterized. 4. Sinus inflammation. Electronically Signed   By: Monte Fantasia M.D.   On: 04/26/2020 04:34   US RENAL  Result Date: 04/29/2020 CLINICAL DATA:  Acute kidney injury EXAM: RENAL / URINARY TRACT ULTRASOUND COMPLETE COMPARISON:  None. FINDINGS: Right Kidney: Renal measurements: 14.4 x 6 x 5.9 cm = volume: 265 mL. Cortical echogenicity normal. No hydronephrosis. Possible exophytic cyst off the midpole but poor visibility. Left Kidney: Renal measurements: 13.6 x 7.3 x 6.4 cm = volume: 332.5 mL. Echogenicity within normal limits. No hydronephrosis. Cyst at the lower pole measuring 6.3 cm. Bladder: Not well seen and presumably empty Other: None. IMPRESSION: 1. Negative for hydronephrosis. 2. Simple appearing left renal cyst Electronically Signed   By: Donavan Foil M.D.   On: 04/29/2020 00:11   IR Intra Cran Stent  Result Date: 04/27/2020 INDICATION: 80 year old male with past medical history significant for diabetes mellitus type 2, hypertension, hyperlipidemia and atrial fibrillation status post cardioversion in November 2021 on Xarelto. Patient had an episode of transient slurred speech and mild left arm weakness on 04/24/2020. Around noon on 04/13/2020, he developed significant left arm weakness, slurred speech and left facial droop. He was taken to outside hospital where he was found to have diminished opacification of the right MCA branches on CTA with area of increased T-max in the right MCA territory. He was taken to our  service for a diagnostic cerebral angiogram and possible mechanical thrombectomy. EXAM: Ultrasound-guided vascular access Diagnostic cerebral angiogram Mechanical thrombectomy Intracranial stenting Flat panel head CT COMPARISON:  CT/CT angiogram of the head and neck April 25, 2020. MEDICATIONS: Refer to anesthesia documentation. ANESTHESIA/SEDATION: The procedure was performed under general anesthesia. CONTRAST:  75 mL of Omnipaque 240 milligrams/mL FLUOROSCOPY TIME:  Fluoroscopy Time: 113 minutes 6 seconds (2,379 mGy). COMPLICATIONS: None immediate. TECHNIQUE: Informed written consent was obtained from the  patient's sister after a thorough discussion of the procedural risks, benefits and alternatives. All questions were addressed. Maximal Sterile Barrier Technique was utilized including caps, mask, sterile gowns, sterile gloves, sterile drape, hand hygiene and skin antiseptic. A timeout was performed prior to the initiation of the procedure. The right groin was prepped and draped in the usual sterile fashion. Using a micropuncture kit and the modified Seldinger technique, access was gained to the right common femoral artery and an 8 French sheath was placed. Real-time ultrasound guidance was utilized for vascular access including the acquisition of a permanent ultrasound image documenting patency of the accessed vessel. Under fluoroscopy, a 6 [place] Berenstein 2 catheter was navigated over a 0.035" Terumo Glidewire into the aortic arch. The catheter was placed into the right common carotid artery and then advanced into the right internal carotid artery. Frontal and lateral angiograms of the head were obtained. FINDINGS: 1. Near occlusion of a proximal right M2/MCA middle division branch with minimal delayed contrast penetration of distal branches. 2. Atherosclerotic changes of the right carotid siphon without hemodynamically significant stenosis. 3. Atherosclerotic changes of the right common femoral artery  without hemodynamically significant stenosis. PROCEDURE: Under biplane roadmap, the Berenstein 2 catheter was exchanged over the wire for a zoom 88 guide catheter. The catheter was placed in the upper cervical segment of the right ICA. A zoom 71 catheter was then navigated through the guide catheter into the petrous segment of the right ICA. Magnified frontal and lateral views of the head were obtained. Then a phenom 21 microcatheter was navigated over a synchro support microguidewire into the right M1/MCA. Multiple attempts to gain access to the right M2 branch proved unsuccessful. The synchro support micro guidewire was then exchanged for a 0.016" headliner micro guidewire which was advanced into the distal right M2/MCA branch. The microcatheter was then advanced over the wire. The microwire was removed and a 4 x 40 solitaire thrombectomy device was deployed spanning the right M2/MCA middle division branch and distal M1 segment. The device was allowed to intercalated with the clot for 4 minutes. The aspiration catheter was advanced to the level of occlusion. The microcatheter was removed. The aspiration catheter and thrombectomy device were removed under constant aspiration. Follow-up right ICA angiograms with magnified frontal and lateral views of the head showed brisk opacification of the right M2/MCA middle division branch with irregularity noted at the branch origin [facility]). Delayed follow-up angiogram showed branch reocclusion. Under biplane roadmap, the zoom 71 aspiration catheter was again navigated over a phenom 21 microcatheter and a headliner microguidewire into the cavernous segment of the right ICA. The microcatheter was then navigated over the wire into the right M2/MCA middle division branch. Then, a 4 x 40 mm solitaire stent retriever was deployed spanning the M2 and distal M1 segments. The device was allowed to intercalated with the clot for 4 minutes. The microcatheter was removed. The aspiration  catheter was advanced to the level of occlusion and connected to a penumbra aspiration pump. The thrombectomy device and aspiration catheter were removed under constant aspiration. Follow-up right ICA angiograms with magnified frontal and lateral views of the head showed brisk opacification of the right M2/MCA middle division branch with irregularity noted at the branch origin [facility]). Delayed follow-up angiogram showed branch reocclusion. Under biplane roadmap, the zoom 71 aspiration catheter was again navigated over a phenom 21 microcatheter and a headliner microguidewire into the cavernous segment of the right ICA. The microcatheter was then navigated over the wire into the  right M2/MCA middle division branch. Then, a 5 x 37 mm embotrap stent retriever was deployed spanning the M2 and distal M1 segments. The device was allowed to intercalated with the clot for 4 minutes. The microcatheter was removed. The aspiration catheter was advanced to the level of occlusion and connected to a penumbra aspiration pump. The thrombectomy device and aspiration catheter were removed under constant aspiration. Follow-up right ICA angiograms with magnified frontal and lateral views of the head showed recanalization of the right M2/MCA middle division branch with hairline stenosis at its origin due to atherosclerotic plaque and stenosis at the mid M2 segment with very delayed opacification of distal branches. Flat panel CT of the head was obtained and post processed in a separate workstation with concurrent attending physician supervision. Selected images were sent to PACS. No evidence of hemorrhagic complication noted. Under biplane roadmap, the zoom 71 aspiration catheter was again navigated over a phenom 21 microcatheter and a Aristotle 14 microguidewire into the cavernous segment of the right ICA. The microcatheter was then navigated over the wire into the right M2/MCA middle division branch. The Aristotle microguidewire was  exchanged for a an exchange length synchro microguidewire. At this point, patient developed severe bradycardia and eventually asystole. The microwire and microcatheter were removed. The table was lowered to start chest compressions. However, patient had spontaneous return of circulation before the start of resuscitation efforts. Follow-up right ICA angiograms with magnified frontal and lateral views of the head again showed recanalization of the right M2/MCA middle division branch with with hairline stenosis. No evidence of contrast extravasation. Under biplane roadmap, an SL 10 microcatheter was navigated over Aristotle 14 microguidewire into the right M2/MCA middle division branch. Subsequently, a 4.5 x 30 mm neuroform atlas stent was deployed spanning the distal right M1, proximal and mid M2/MCA middle division branch. Follow-up angiogram showed brisk flow through the entire right MCA territory. The catheter construct was subsequently withdrawn. A right common femoral angiogram was performed in right anterior oblique view. The puncture is at the common femoral artery level. Atherosclerotic plaques are noted without hemodynamically significant stenosis. The sheath was exchanged for an 8 [place] Angio-Seal which was utilized for access closure. Immediate hemostasis was achieved. IMPRESSION: Mechanical thrombectomy and intracranial stenting performed for treatment of a proximal right M2/MCA middle division branch near occlusion with superimposed atherosclerotic plaque. A total of 3 passes performed with stent retriever followed by intracranial stenting. Intracranial stenting performed after bolus infusion of cangrelor followed by low-dose protocol drip. PLAN: Patient remained intubated and transferred to ICU given hemodynamic instability. Continue cangrelor drip until transition to aspirin and Brilinta. Family updated by telephone immediately after the end of the procedure. Electronically Signed   By: Pedro Earls M.D.   On: 04/27/2020 10:36   IR CT Head Ltd  Result Date: 04/27/2020 INDICATION: 80 year old male with past medical history significant for diabetes mellitus type 2, hypertension, hyperlipidemia and atrial fibrillation status post cardioversion in November 2021 on Xarelto. Patient had an episode of transient slurred speech and mild left arm weakness on 04/24/2020. Around noon on 04/12/2020, he developed significant left arm weakness, slurred speech and left facial droop. He was taken to outside hospital where he was found to have diminished opacification of the right MCA branches on CTA with area of increased T-max in the right MCA territory. He was taken to our service for a diagnostic cerebral angiogram and possible mechanical thrombectomy. EXAM: Ultrasound-guided vascular access Diagnostic cerebral angiogram Mechanical thrombectomy Intracranial stenting  Flat panel head CT COMPARISON:  CT/CT angiogram of the head and neck April 25, 2020. MEDICATIONS: Refer to anesthesia documentation. ANESTHESIA/SEDATION: The procedure was performed under general anesthesia. CONTRAST:  75 mL of Omnipaque 240 milligrams/mL FLUOROSCOPY TIME:  Fluoroscopy Time: 113 minutes 6 seconds (2,379 mGy). COMPLICATIONS: None immediate. TECHNIQUE: Informed written consent was obtained from the patient's sister after a thorough discussion of the procedural risks, benefits and alternatives. All questions were addressed. Maximal Sterile Barrier Technique was utilized including caps, mask, sterile gowns, sterile gloves, sterile drape, hand hygiene and skin antiseptic. A timeout was performed prior to the initiation of the procedure. The right groin was prepped and draped in the usual sterile fashion. Using a micropuncture kit and the modified Seldinger technique, access was gained to the right common femoral artery and an 8 French sheath was placed. Real-time ultrasound guidance was utilized for vascular access including  the acquisition of a permanent ultrasound image documenting patency of the accessed vessel. Under fluoroscopy, a 6 [place] Berenstein 2 catheter was navigated over a 0.035" Terumo Glidewire into the aortic arch. The catheter was placed into the right common carotid artery and then advanced into the right internal carotid artery. Frontal and lateral angiograms of the head were obtained. FINDINGS: 1. Near occlusion of a proximal right M2/MCA middle division branch with minimal delayed contrast penetration of distal branches. 2. Atherosclerotic changes of the right carotid siphon without hemodynamically significant stenosis. 3. Atherosclerotic changes of the right common femoral artery without hemodynamically significant stenosis. PROCEDURE: Under biplane roadmap, the Berenstein 2 catheter was exchanged over the wire for a zoom 88 guide catheter. The catheter was placed in the upper cervical segment of the right ICA. A zoom 71 catheter was then navigated through the guide catheter into the petrous segment of the right ICA. Magnified frontal and lateral views of the head were obtained. Then a phenom 21 microcatheter was navigated over a synchro support microguidewire into the right M1/MCA. Multiple attempts to gain access to the right M2 branch proved unsuccessful. The synchro support micro guidewire was then exchanged for a 0.016" headliner micro guidewire which was advanced into the distal right M2/MCA branch. The microcatheter was then advanced over the wire. The microwire was removed and a 4 x 40 solitaire thrombectomy device was deployed spanning the right M2/MCA middle division branch and distal M1 segment. The device was allowed to intercalated with the clot for 4 minutes. The aspiration catheter was advanced to the level of occlusion. The microcatheter was removed. The aspiration catheter and thrombectomy device were removed under constant aspiration. Follow-up right ICA angiograms with magnified frontal and  lateral views of the head showed brisk opacification of the right M2/MCA middle division branch with irregularity noted at the branch origin [facility]). Delayed follow-up angiogram showed branch reocclusion. Under biplane roadmap, the zoom 71 aspiration catheter was again navigated over a phenom 21 microcatheter and a headliner microguidewire into the cavernous segment of the right ICA. The microcatheter was then navigated over the wire into the right M2/MCA middle division branch. Then, a 4 x 40 mm solitaire stent retriever was deployed spanning the M2 and distal M1 segments. The device was allowed to intercalated with the clot for 4 minutes. The microcatheter was removed. The aspiration catheter was advanced to the level of occlusion and connected to a penumbra aspiration pump. The thrombectomy device and aspiration catheter were removed under constant aspiration. Follow-up right ICA angiograms with magnified frontal and lateral views of the head showed brisk opacification  of the right M2/MCA middle division branch with irregularity noted at the branch origin [facility]). Delayed follow-up angiogram showed branch reocclusion. Under biplane roadmap, the zoom 71 aspiration catheter was again navigated over a phenom 21 microcatheter and a headliner microguidewire into the cavernous segment of the right ICA. The microcatheter was then navigated over the wire into the right M2/MCA middle division branch. Then, a 5 x 37 mm embotrap stent retriever was deployed spanning the M2 and distal M1 segments. The device was allowed to intercalated with the clot for 4 minutes. The microcatheter was removed. The aspiration catheter was advanced to the level of occlusion and connected to a penumbra aspiration pump. The thrombectomy device and aspiration catheter were removed under constant aspiration. Follow-up right ICA angiograms with magnified frontal and lateral views of the head showed recanalization of the right M2/MCA middle  division branch with hairline stenosis at its origin due to atherosclerotic plaque and stenosis at the mid M2 segment with very delayed opacification of distal branches. Flat panel CT of the head was obtained and post processed in a separate workstation with concurrent attending physician supervision. Selected images were sent to PACS. No evidence of hemorrhagic complication noted. Under biplane roadmap, the zoom 71 aspiration catheter was again navigated over a phenom 21 microcatheter and a Aristotle 14 microguidewire into the cavernous segment of the right ICA. The microcatheter was then navigated over the wire into the right M2/MCA middle division branch. The Aristotle microguidewire was exchanged for a an exchange length synchro microguidewire. At this point, patient developed severe bradycardia and eventually asystole. The microwire and microcatheter were removed. The table was lowered to start chest compressions. However, patient had spontaneous return of circulation before the start of resuscitation efforts. Follow-up right ICA angiograms with magnified frontal and lateral views of the head again showed recanalization of the right M2/MCA middle division branch with with hairline stenosis. No evidence of contrast extravasation. Under biplane roadmap, an SL 10 microcatheter was navigated over Aristotle 14 microguidewire into the right M2/MCA middle division branch. Subsequently, a 4.5 x 30 mm neuroform atlas stent was deployed spanning the distal right M1, proximal and mid M2/MCA middle division branch. Follow-up angiogram showed brisk flow through the entire right MCA territory. The catheter construct was subsequently withdrawn. A right common femoral angiogram was performed in right anterior oblique view. The puncture is at the common femoral artery level. Atherosclerotic plaques are noted without hemodynamically significant stenosis. The sheath was exchanged for an 8 [place] Angio-Seal which was utilized for  access closure. Immediate hemostasis was achieved. IMPRESSION: Mechanical thrombectomy and intracranial stenting performed for treatment of a proximal right M2/MCA middle division branch near occlusion with superimposed atherosclerotic plaque. A total of 3 passes performed with stent retriever followed by intracranial stenting. Intracranial stenting performed after bolus infusion of cangrelor followed by low-dose protocol drip. PLAN: Patient remained intubated and transferred to ICU given hemodynamic instability. Continue cangrelor drip until transition to aspirin and Brilinta. Family updated by telephone immediately after the end of the procedure. Electronically Signed   By: Pedro Earls M.D.   On: 04/27/2020 10:36   IR US Guide Vasc Access Right  Result Date: 04/27/2020 INDICATION: 80 year old male with past medical history significant for diabetes mellitus type 2, hypertension, hyperlipidemia and atrial fibrillation status post cardioversion in November 2021 on Xarelto. Patient had an episode of transient slurred speech and mild left arm weakness on 04/24/2020. Around noon on 04/20/2020, he developed significant left arm weakness, slurred speech  and left facial droop. He was taken to outside hospital where he was found to have diminished opacification of the right MCA branches on CTA with area of increased T-max in the right MCA territory. He was taken to our service for a diagnostic cerebral angiogram and possible mechanical thrombectomy. EXAM: Ultrasound-guided vascular access Diagnostic cerebral angiogram Mechanical thrombectomy Intracranial stenting Flat panel head CT COMPARISON:  CT/CT angiogram of the head and neck April 25, 2020. MEDICATIONS: Refer to anesthesia documentation. ANESTHESIA/SEDATION: The procedure was performed under general anesthesia. CONTRAST:  75 mL of Omnipaque 240 milligrams/mL FLUOROSCOPY TIME:  Fluoroscopy Time: 113 minutes 6 seconds (2,379 mGy). COMPLICATIONS:  None immediate. TECHNIQUE: Informed written consent was obtained from the patient's sister after a thorough discussion of the procedural risks, benefits and alternatives. All questions were addressed. Maximal Sterile Barrier Technique was utilized including caps, mask, sterile gowns, sterile gloves, sterile drape, hand hygiene and skin antiseptic. A timeout was performed prior to the initiation of the procedure. The right groin was prepped and draped in the usual sterile fashion. Using a micropuncture kit and the modified Seldinger technique, access was gained to the right common femoral artery and an 8 French sheath was placed. Real-time ultrasound guidance was utilized for vascular access including the acquisition of a permanent ultrasound image documenting patency of the accessed vessel. Under fluoroscopy, a 6 [place] Berenstein 2 catheter was navigated over a 0.035" Terumo Glidewire into the aortic arch. The catheter was placed into the right common carotid artery and then advanced into the right internal carotid artery. Frontal and lateral angiograms of the head were obtained. FINDINGS: 1. Near occlusion of a proximal right M2/MCA middle division branch with minimal delayed contrast penetration of distal branches. 2. Atherosclerotic changes of the right carotid siphon without hemodynamically significant stenosis. 3. Atherosclerotic changes of the right common femoral artery without hemodynamically significant stenosis. PROCEDURE: Under biplane roadmap, the Berenstein 2 catheter was exchanged over the wire for a zoom 88 guide catheter. The catheter was placed in the upper cervical segment of the right ICA. A zoom 71 catheter was then navigated through the guide catheter into the petrous segment of the right ICA. Magnified frontal and lateral views of the head were obtained. Then a phenom 21 microcatheter was navigated over a synchro support microguidewire into the right M1/MCA. Multiple attempts to gain access to  the right M2 branch proved unsuccessful. The synchro support micro guidewire was then exchanged for a 0.016" headliner micro guidewire which was advanced into the distal right M2/MCA branch. The microcatheter was then advanced over the wire. The microwire was removed and a 4 x 40 solitaire thrombectomy device was deployed spanning the right M2/MCA middle division branch and distal M1 segment. The device was allowed to intercalated with the clot for 4 minutes. The aspiration catheter was advanced to the level of occlusion. The microcatheter was removed. The aspiration catheter and thrombectomy device were removed under constant aspiration. Follow-up right ICA angiograms with magnified frontal and lateral views of the head showed brisk opacification of the right M2/MCA middle division branch with irregularity noted at the branch origin [facility]). Delayed follow-up angiogram showed branch reocclusion. Under biplane roadmap, the zoom 71 aspiration catheter was again navigated over a phenom 21 microcatheter and a headliner microguidewire into the cavernous segment of the right ICA. The microcatheter was then navigated over the wire into the right M2/MCA middle division branch. Then, a 4 x 40 mm solitaire stent retriever was deployed spanning the M2 and distal M1  segments. The device was allowed to intercalated with the clot for 4 minutes. The microcatheter was removed. The aspiration catheter was advanced to the level of occlusion and connected to a penumbra aspiration pump. The thrombectomy device and aspiration catheter were removed under constant aspiration. Follow-up right ICA angiograms with magnified frontal and lateral views of the head showed brisk opacification of the right M2/MCA middle division branch with irregularity noted at the branch origin [facility]). Delayed follow-up angiogram showed branch reocclusion. Under biplane roadmap, the zoom 71 aspiration catheter was again navigated over a phenom 21  microcatheter and a headliner microguidewire into the cavernous segment of the right ICA. The microcatheter was then navigated over the wire into the right M2/MCA middle division branch. Then, a 5 x 37 mm embotrap stent retriever was deployed spanning the M2 and distal M1 segments. The device was allowed to intercalated with the clot for 4 minutes. The microcatheter was removed. The aspiration catheter was advanced to the level of occlusion and connected to a penumbra aspiration pump. The thrombectomy device and aspiration catheter were removed under constant aspiration. Follow-up right ICA angiograms with magnified frontal and lateral views of the head showed recanalization of the right M2/MCA middle division branch with hairline stenosis at its origin due to atherosclerotic plaque and stenosis at the mid M2 segment with very delayed opacification of distal branches. Flat panel CT of the head was obtained and post processed in a separate workstation with concurrent attending physician supervision. Selected images were sent to PACS. No evidence of hemorrhagic complication noted. Under biplane roadmap, the zoom 71 aspiration catheter was again navigated over a phenom 21 microcatheter and a Aristotle 14 microguidewire into the cavernous segment of the right ICA. The microcatheter was then navigated over the wire into the right M2/MCA middle division branch. The Aristotle microguidewire was exchanged for a an exchange length synchro microguidewire. At this point, patient developed severe bradycardia and eventually asystole. The microwire and microcatheter were removed. The table was lowered to start chest compressions. However, patient had spontaneous return of circulation before the start of resuscitation efforts. Follow-up right ICA angiograms with magnified frontal and lateral views of the head again showed recanalization of the right M2/MCA middle division branch with with hairline stenosis. No evidence of contrast  extravasation. Under biplane roadmap, an SL 10 microcatheter was navigated over Aristotle 14 microguidewire into the right M2/MCA middle division branch. Subsequently, a 4.5 x 30 mm neuroform atlas stent was deployed spanning the distal right M1, proximal and mid M2/MCA middle division branch. Follow-up angiogram showed brisk flow through the entire right MCA territory. The catheter construct was subsequently withdrawn. A right common femoral angiogram was performed in right anterior oblique view. The puncture is at the common femoral artery level. Atherosclerotic plaques are noted without hemodynamically significant stenosis. The sheath was exchanged for an 8 [place] Angio-Seal which was utilized for access closure. Immediate hemostasis was achieved. IMPRESSION: Mechanical thrombectomy and intracranial stenting performed for treatment of a proximal right M2/MCA middle division branch near occlusion with superimposed atherosclerotic plaque. A total of 3 passes performed with stent retriever followed by intracranial stenting. Intracranial stenting performed after bolus infusion of cangrelor followed by low-dose protocol drip. PLAN: Patient remained intubated and transferred to ICU given hemodynamic instability. Continue cangrelor drip until transition to aspirin and Brilinta. Family updated by telephone immediately after the end of the procedure. Electronically Signed   By: Pedro Earls M.D.   On: 04/27/2020 10:36   CT CEREBRAL  PERFUSION W CONTRAST  Result Date: 04/12/2020 CLINICAL DATA:  Slurred speech and left-sided weakness. EXAM: CT ANGIOGRAPHY HEAD AND NECK CT PERFUSION BRAIN TECHNIQUE: Multidetector CT imaging of the head and neck was performed using the standard protocol during bolus administration of intravenous contrast. Multiplanar CT image reconstructions and MIPs were obtained to evaluate the vascular anatomy. Carotid stenosis measurements (when applicable) are obtained utilizing  NASCET criteria, using the distal internal carotid diameter as the denominator. Multiphase CT imaging of the brain was performed following IV bolus contrast injection. Subsequent parametric perfusion maps were calculated using RAPID software. CONTRAST:  83m OMNIPAQUE IOHEXOL 350 MG/ML SOLN COMPARISON:  None. FINDINGS: CT HEAD FINDINGS Brain: There is no evidence of an acute infarct, intracranial hemorrhage, mass, midline shift, or extra-axial fluid collection. Mild cerebral atrophy is within normal limits for age. Patchy hypodensities in the cerebral white matter bilaterally are nonspecific but compatible with mild chronic small vessel ischemic disease. There is a chronic lacunar infarct at the inferior aspect of the right caudate head. Vascular: Calcified atherosclerosis at the skull base. No hyperdense vessel. Skull: No fracture or suspicious osseous lesion. Sinuses/Orbits: Mild scattered mucosal thickening in the paranasal sinuses. Clear mastoid air cells. Bilateral cataract extraction. Other: None. ASPECTS ([place] Early CT Score) - Ganglionic level infarction (caudate, lentiform nuclei, internal capsule, insula, M1-M3 cortex): 7 - Supraganglionic infarction (M4-M6 cortex): 3 Total score (0-10 with 10 being normal): 10 Review of the MIP images confirms the above findings CTA NECK FINDINGS Aortic arch: Normal variant aortic arch branching pattern with common origin of the brachiocephalic and left common carotid arteries. Mild calcified plaque in the aortic arch without evidence of a significant arch vessel origin stenosis. Right carotid system: Patent with mild scattered predominantly calcified plaque throughout the common carotid artery and at the carotid bifurcation. No evidence of a significant stenosis or dissection. Left carotid system: Patent with mild plaque in the mid common carotid artery not resulting in significant stenosis. More extensive, heavily calcified plaque at the carotid  bifurcation results in less than 50% proximal ICA stenosis. No evidence of dissection. Vertebral arteries: There is either severe stenosis or short segment occlusion of the proximal left vertebral artery at its origin with the vessel being patent throughout the remainder of the neck. There is a diffusely thready appearance of the right vertebral artery V1 and V2 segments with the right V3 segment being occluded. Skeleton: Advanced cervical disc and facet degeneration. Other neck: No evidence of cervical lymphadenopathy or mass. Upper chest: Partially visualized small right pleural effusion. No apical lung consolidation. Review of the MIP images confirms the above findings CTA HEAD FINDINGS Anterior circulation: The internal carotid arteries are patent from skull base to carotid termini with calcified plaque resulting in mild cavernous and proximal supraclinoid stenosis bilaterally. The right M1 segment is patent with a mild stenosis proximally. There is a decreased number of distal right MCA branch vessels compared to the left, however a discrete proximal branch occlusion is not identified. The left MCA is patent with branch vessel irregularity as well as a mild-to-moderate proximal M2 stenosis. Both ACAs are patent without evidence of a significant proximal stenosis. No aneurysm is identified. Posterior circulation: The intracranial left vertebral artery is patent but diffusely irregular and narrowed with multiple moderate to severe stenoses. The right V4 segment is occluded proximally with likely retrograde filling distally supplying the right PICA. The basilar artery is patent but diffusely small and irregular which is partly congenital though with suspected superimposed  atherosclerotic narrowing as well. Patent SCAs are seen bilaterally. There is a fetal origin of the PCAs without evidence of a significant proximal PCA stenosis. No aneurysm is identified. Venous sinuses: Patent. Anatomic variants: Fetal origin  of the PCAs. Review of the MIP images confirms the above findings CT Brain Perfusion Findings: ASPECTS: 10 CBF (<30%) Volume: 0 mL Perfusion (Tmax>6.0s) volume: 55 mL Mismatch Volume: 55 mL Infarction Location: No core infarct evident by CTP. Ischemic penumbra in the right MCA territory, greatest in the frontal lobe at and superior to the operculum though with parietal and temporal lobe involvement as well. IMPRESSION: CT HEAD: 1. No evidence of acute intracranial abnormality.  ASPECTS of 10. 2. Mild chronic small vessel ischemic disease. CTA HEAD AND NECK: 1. Decreased number of right MCA branch vessels without a discrete proximal occlusion identified. It is possible that this reflects a stump occlusion of a proximal to mid M2 branch which is not readily apparent by CTA. 2. Intracranial atherosclerosis with multiple anterior and posterior circulation stenoses as detailed above. 3. 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 4. Severe stenosis or short segment occlusion of the left vertebral artery at its origin with patency of the vessel more distally. Heavily diseased left V4 segment. 5. Cervical carotid atherosclerosis without significant stenosis. 6.  Aortic Atherosclerosis (ICD10-I70.0). CT BRAIN PERFUSION: 55 mL of ischemic penumbra in the right MCA territory without evidence of a core infarct. These results were called by telephone at the time of interpretation on 04/17/2020 at 2:15 pm to Dr. Donnetta Simpers, who verbally acknowledged these results. Electronically Signed   By: Logan Bores M.D.   On: 05/03/2020 14:58   DG CHEST PORT 1 VIEW  Result Date: May 05, 2020 CLINICAL DATA:  Hypotension EXAM: PORTABLE CHEST 1 VIEW COMPARISON:  04/30/2020 FINDINGS: Endotracheal tube 6.2 cm above the carina, nasoenteric feeding tube extending into the upper abdomen beyond the margin of the examination, right internal jugular Cordis introducer, and right subclavian central venous catheter with its tip within the  superior vena cava are all unchanged. Developing bilateral lower lobe collapse. Nodular infiltrate throughout the left upper and mid lung zones has progressed slightly in the interval since prior examination, possibly infectious or inflammatory in the acute setting. Central pulmonary arterial enlargement contributes to bilateral hilar enlargement. Cardiac size within normal limits. Pulmonary vasculature is otherwise unremarkable. IMPRESSION: Stable support lines and tubes. Developing bibasilar atelectasis. Increasing nodular infiltrate within the left lung, possibly infectious or inflammatory. Central pulmonary arterial enlargement contributing to bilateral hilar enlargement. Electronically Signed   By: Fidela Salisbury MD   On: 05-05-20 05:58   DG CHEST PORT 1 VIEW  Result Date: 04/30/2020 CLINICAL DATA:  Central line placement EXAM: PORTABLE CHEST 1 VIEW COMPARISON:  Portable exam 1610 hours compared to 04/29/2020 FINDINGS: RIGHT jugular central venous catheter with tip projecting over SVC. Feeding tube extends into stomach. Tip of endotracheal tube projects 6.6 cm above carina. Normal heart size mediastinal contours. Atherosclerotic calcification aorta. Bibasilar atelectasis versus infiltrate. Improved LEFT mid lung infiltrates since previous exam. No pneumothorax. IMPRESSION: Bibasilar atelectasis with improved LEFT mid lung infiltrate. Electronically Signed   By: Lavonia Dana M.D.   On: 04/30/2020 16:38   DG CHEST PORT 1 VIEW  Result Date: 04/29/2020 CLINICAL DATA:  Renal failure. EXAM: PORTABLE CHEST 1 VIEW COMPARISON:  April 28, 2020. FINDINGS: Stable cardiomediastinal silhouette. Endotracheal and feeding tubes are unchanged in position. Right internal jugular catheter is unchanged. No pneumothorax or pleural effusion is noted. New mild  opacity is seen involving peripheral left midlung concerning for possible pneumonia. Mild right basilar subsegmental atelectasis is noted. Bony thorax is  unremarkable. IMPRESSION: Stable support apparatus. New mild opacity is seen involving peripheral left midlung concerning for possible pneumonia. Electronically Signed   By: Marijo Conception M.D.   On: 04/29/2020 14:32   DG CHEST PORT 1 VIEW  Result Date: 04/28/2020 CLINICAL DATA:  Assess positioning of hemodialysis catheter. EXAM: PORTABLE CHEST 1 VIEW COMPARISON:  Same day chest radiograph at 0900 hours. FINDINGS: Right IJ central venous catheter with tip overlying the superior vena cava. Endotracheal tube with tip overlying the distal thoracic trachea. Enteric tube coursing below the diaphragm with tip obscured by collimation. The heart size and mediastinal contours are unchanged. Aortic atherosclerosis. Similar bilateral hilar airspace opacities. No significant pleural effusion or visible pneumothorax. The visualized skeletal structures are unchanged. IMPRESSION: 1. Right IJ since venous catheter with tip overlying the superior vena cava. No visible pneumothorax. Electronically Signed   By: Dahlia Bailiff MD   On: 04/28/2020 12:27   DG CHEST PORT 1 VIEW  Result Date: 04/28/2020 CLINICAL DATA:  Hypoxia EXAM: PORTABLE CHEST 1 VIEW COMPARISON:  April 26, 2020 FINDINGS: Endotracheal tube tip is 2.2 cm above the carina. Enteric tube tip is below the diaphragm. No pneumothorax. There is airspace opacity overlying the right hilum, a change from 2 days prior. There is bibasilar atelectasis. Heart size and pulmonary vascularity are normal. No adenopathy. No bone lesions. There is aortic atherosclerosis. IMPRESSION: Tube positions as described without pneumothorax. Suspect pneumonia right perihilar region given new opacity in this area compared to 2 days prior. Bibasilar atelectasis. Stable cardiac silhouette. Aortic Atherosclerosis (ICD10-I70.0). Electronically Signed   By: Lowella Grip III M.D.   On: 04/28/2020 09:47   DG CHEST PORT 1 VIEW  Result Date: 04/26/2020 CLINICAL DATA:  Hypoxia EXAM:  PORTABLE CHEST 1 VIEW COMPARISON:  04/24/2020 FINDINGS: The endotracheal tube terminates above the carina. There is elevation of the right hemidiaphragm as before. There are persistent but improving perihilar airspace opacities. There is no pneumothorax. No definite acute displaced fracture. No large pleural effusion. IMPRESSION: 1. Improving bilateral perihilar airspace opacities. 2. Endotracheal tube as above. Electronically Signed   By: Constance Holster M.D.   On: 04/26/2020 15:20   DG CHEST PORT 1 VIEW  Result Date: 04/18/2020 CLINICAL DATA:  Endotracheal placement EXAM: PORTABLE CHEST 1 VIEW COMPARISON:  Earlier same day FINDINGS: Endotracheal tube tip 4 cm above the carina. There is worsened atelectasis in the perihilar regions both sides. Possible pulmonary venous hypertension without Carthel edema. No visible effusion. IMPRESSION: Endotracheal tube tip 4 cm above the carina. Worsened perihilar atelectasis. Possible pulmonary venous hypertension. No Slater edema. No visible effusion or pneumothorax. Electronically Signed   By: Nelson Chimes M.D.   On: 04/23/2020 19:29   DG Abd Portable 1V  Result Date: 04/29/2020 CLINICAL DATA:  Nasogastric tube placement. EXAM: PORTABLE ABDOMEN - 1 VIEW COMPARISON:  April 26, 2020. FINDINGS: The bowel gas pattern is normal. Feeding tube tip is seen in expected position of distal stomach. No radio-opaque calculi or other significant radiographic abnormality are seen. IMPRESSION: Feeding tube tip seen in expected position of distal stomach. No evidence of bowel obstruction or ileus. Electronically Signed   By: Marijo Conception M.D.   On: 04/29/2020 14:29   DG Abd Portable 1V  Result Date: 04/26/2020 CLINICAL DATA:  OG tube placement. EXAM: PORTABLE ABDOMEN - 1 VIEW COMPARISON:  None. FINDINGS: An  enteric tube is identified with tip overlying the proximal-mid stomach. IMPRESSION: Enteric tube with tip overlying the proximal-mid stomach. Electronically Signed   By:  Margarette Canada M.D.   On: 04/26/2020 09:28   ECHOCARDIOGRAM COMPLETE  Result Date: 04/27/2020    ECHOCARDIOGRAM REPORT   Patient Name:   MELROY BOUGHER Date of Exam: 04/27/2020 Medical Rec #:  [id number]       Height:       73.0 in Accession #:    [id number]      Weight:       249.1 lb Date of Birth:  May 12, 1940       BSA:          2.362 m Patient Age:    27 years        BP:           118/66 mmHg Patient Gender: M               HR:           81 bpm. Exam Location:  Inpatient Procedure: 2D Echo, Cardiac Doppler, Color Doppler and Intracardiac            Opacification Agent Indications:    Stroke  History:        Patient has prior history of Echocardiogram examinations, most                 recent 03/14/2017. Abnormal ECG, Stroke, Arrythmias:Atrial                 Fibrillation, Signs/Symptoms:Dyspnea and Shortness of Breath;                 Risk Factors:Hypertension, Diabetes and Dyslipidemia.  Sonographer:    Roseanna Rainbow RDCS Referring Phys: [id number] [facility]  Sonographer Comments: Technically difficult study due to poor echo windows, patient is morbidly obese and echo performed with patient supine and on artificial respirator. Image acquisition challenging due to patient body habitus. IMPRESSIONS  1. Poor acoustic windows and ectopy make accurate evaluation of LVEF difficult Definity used Overall LVEF is probably normal to mildly decreased Consider repeat when patient extubated, able to turn . There is moderate left ventricular hypertrophy. Left ventricular diastolic function could not be evaluated.  2. Right ventricular systolic function is mildly reduced. The right ventricular size is normal. Mildly increased right ventricular wall thickness.  3. The mitral valve is normal in structure. No evidence of mitral valve regurgitation.  4. The aortic valve is tricuspid. Aortic valve regurgitation is not visualized. Mild to moderate aortic valve sclerosis/calcification is present, without any evidence of aortic stenosis.  5.  The inferior vena cava is dilated in size with <50% respiratory variability, suggesting right atrial pressure of 15 mmHg. FINDINGS  Left Ventricle: Poor acoustic windows and ectopy make accurate evaluation of LVEF difficult Definity used Overall LVEF is probably normal to mildly decreased Consider repeat when patient extubated, able to turn. Definity contrast agent was given IV to delineate the left ventricular endocardial borders. The left ventricular internal cavity size was normal in size. There is moderate left ventricular hypertrophy. Left ventricular diastolic function could not be evaluated. Right Ventricle: The right ventricular size is normal. Mildly increased right ventricular wall thickness. Right ventricular systolic function is mildly reduced. Left Atrium: Left atrial size was normal in size. Right Atrium: Right atrial size was normal in size. Pericardium: There is no evidence of pericardial effusion. Presence of pericardial fat pad. Mitral Valve: The mitral valve is normal in  structure. No evidence of mitral valve regurgitation. Tricuspid Valve: The tricuspid valve is normal in structure. Tricuspid valve regurgitation is trivial. Aortic Valve: The aortic valve is tricuspid. Aortic valve regurgitation is not visualized. Mild to moderate aortic valve sclerosis/calcification is present, without any evidence of aortic stenosis. Pulmonic Valve: The pulmonic valve was not well visualized. Pulmonic valve regurgitation is not visualized. Aorta: The aortic root is normal in size and structure. Venous: The inferior vena cava is dilated in size with less than 50% respiratory variability, suggesting right atrial pressure of 15 mmHg. IAS/Shunts: The interatrial septum was not assessed.  LEFT VENTRICLE PLAX 2D LVIDd:         4.80 cm LVIDs:         3.90 cm LV PW:         2.00 cm LV IVS:        1.40 cm LVOT diam:     2.10 cm LV SV:         51 LV SV Index:   21 LVOT Area:     3.46 cm  LV Volumes (MOD) LV vol d, MOD  A2C: 103.0 ml LV vol d, MOD A4C: 169.0 ml LV vol s, MOD A2C: 25.8 ml LV vol s, MOD A4C: 70.3 ml LV SV MOD A2C:     77.2 ml LV SV MOD A4C:     169.0 ml LV SV MOD BP:      92.2 ml RIGHT VENTRICLE            IVC RV S prime:     7.29 cm/s  IVC diam: 3.20 cm TAPSE (M-mode): 1.6 cm LEFT ATRIUM             Index       RIGHT ATRIUM           Index LA diam:        5.30 cm 2.24 cm/m  RA Area:     17.00 cm LA Vol (A2C):   69.6 ml 29.46 ml/m RA Volume:   48.20 ml  20.41 ml/m LA Vol (A4C):   94.2 ml 39.88 ml/m LA Biplane Vol: 83.1 ml 35.18 ml/m  AORTIC VALVE LVOT Vmax:   90.80 cm/s LVOT Vmean:  65.000 cm/s LVOT VTI:    0.146 m  AORTA Ao Root diam: 3.70 cm Ao Asc diam:  3.60 cm MITRAL VALVE MV Area (PHT): 5.38 cm     SHUNTS MV Decel Time: 141 msec     Systemic VTI:  0.15 m MV E velocity: 107.90 cm/s  Systemic Diam: 2.10 cm Dorris Carnes MD Electronically signed by Dorris Carnes MD Signature Date/Time: 04/27/2020/7:51:44 PM    Final    ECHO TEE  Result Date: 04/29/2020    TRANSESOPHOGEAL ECHO REPORT   Patient Name:   TAEDYN GLASSCOCK Date of Exam: 04/29/2020 Medical Rec #:  [id number]       Height:       75.0 in Accession #:    [id number]      Weight:       317.5 lb Date of Birth:  02/05/1941       BSA:          2.671 m Patient Age:    49 years        BP:           125/50 mmHg Patient Gender: M               HR:  116 bpm. Exam Location:  Inpatient Procedure: Transesophageal Echo, Cardiac Doppler and Color Doppler Indications:     Stroke 434.91 / I63.9  History:         Patient has prior history of Echocardiogram examinations, most                  recent 04/27/2020. Arrythmias:Atrial Fibrillation,                  Signs/Symptoms:Shortness of Breath; Risk Factors:Hypertension,                  Diabetes and Dyslipidemia.  Sonographer:     Darlina Sicilian RDCS Referring Phys:  [id number] Lorenza Chick Diagnosing Phys: Cherlynn Kaiser MD PROCEDURE: After discussion of the risks and benefits of a TEE, an informed consent was  obtained from a family member. The patient was intubated. TEE procedure time was 17 minutes. The transesophogeal probe was passed without difficulty through the esophogus of the patient. Imaged were obtained with the patient in a supine position. Local oropharyngeal anesthetic was provided with Cetacaine. Sedation performed by different physician. The patient was monitored while under deep sedation. Anesthestetic sedation was provided intravenously by Anesthesiology: 21m of Propofol. Image quality was good. The patient's vital signs; including heart rate, blood pressure, and oxygen saturation; remained stable throughout the procedure. The patient developed no complications during the procedure. IMPRESSIONS  1. 1.8 x 0.9 cm mobile echodensity on the atrial aspect of the tricuspid valve. Attachment point is most likely anterior tricuspid valve leaflet. 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 The tricuspid valve is abnormal. Tricuspid valve regurgitation is mild to moderate.  2. Left ventricular ejection fraction, by estimation, is 45 to 50%. The left ventricle has mildly decreased function. There is moderate left ventricular hypertrophy.  3. Right ventricular systolic function is mildly reduced. The right ventricular size is mildly enlarged.  4. Left atrial size was moderately dilated. No left atrial/left atrial appendage thrombus was detected. The LAA emptying velocity was 72 cm/s.  5. Right atrial size was mild to moderately dilated.  6. The mitral valve is degenerative. Mild mitral valve regurgitation.  7. The aortic valve is abnormal. There is mild calcification of the aortic valve. Aortic valve regurgitation is trivial. No aortic stenosis is present.  8. Aortic dilatation noted. There is borderline dilatation of the aortic root and of the ascending aorta, measuring 40 mm. There is Severe (Grade IV) atheroma plaque involving the transverse and descending aorta.  9. Agitated  saline contrast bubble study was negative, with no evidence of any interatrial shunt. FINDINGS  Left Ventricle: Left ventricular ejection fraction, by estimation, is 45 to 50%. The left ventricle has mildly decreased function. The left ventricular internal cavity size was normal in size. There is moderate left ventricular hypertrophy. Right Ventricle: The right ventricular size is mildly enlarged. Right vetricular wall thickness was not well visualized. Right ventricular systolic function is mildly reduced. Left Atrium: Left atrial size was moderately dilated. No left atrial/left atrial appendage thrombus was detected. The LAA emptying velocity was 72 cm/s. Right Atrium: Right atrial size was mild to moderately dilated. Pericardium: There is no evidence of pericardial effusion. Mitral Valve: The mitral valve is degenerative in appearance. Mild mitral valve regurgitation. Tricuspid Valve: 1.8 x 0.9 cm mobile echodensity on the atrial aspect of the tricuspid valve. Attachment point is most likely anterior tricuspid valve leaflet. 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 The tricuspid valve  is abnormal. Tricuspid valve regurgitation is mild to moderate. Aortic Valve: The aortic valve is abnormal. There is mild calcification of the aortic valve. Aortic valve regurgitation is trivial. No aortic stenosis is present. Aortic valve mean gradient measures 4.0 mmHg. Aortic valve peak gradient measures 7.2 mmHg.  Aortic valve area, by VTI measures 3.02 cm. Pulmonic Valve: The pulmonic valve was grossly normal. Pulmonic valve regurgitation is trivial. Aorta: Aortic dilatation noted. There is borderline dilatation of the aortic root and of the ascending aorta, measuring 40 mm. There is severe (Grade IV) atheroma plaque involving the transverse and descending aorta. IAS/Shunts: No atrial level shunt detected by color flow Doppler. Agitated saline contrast was given intravenously to evaluate  for intracardiac shunting. Agitated saline contrast bubble study was negative, with no evidence of any interatrial shunt.  LEFT VENTRICLE PLAX 2D LVOT diam:     2.10 cm LV SV:         56 LV SV Index:   21 LVOT Area:     3.46 cm  AORTIC VALVE AV Area (Vmax):    2.84 cm AV Area (Vmean):   2.71 cm AV Area (VTI):     3.02 cm AV Vmax:           134.00 cm/s AV Vmean:          94.700 cm/s AV VTI:            0.186 m AV Peak Grad:      7.2 mmHg AV Mean Grad:      4.0 mmHg LVOT Vmax:         110.00 cm/s LVOT Vmean:        74.100 cm/s LVOT VTI:          0.162 m LVOT/AV VTI ratio: 0.87  AORTA Ao Root diam: 4.00 cm Ao Asc diam:  4.00 cm TRICUSPID VALVE TR Peak grad:   30.9 mmHg TR Vmax:        278.00 cm/s  SHUNTS Systemic VTI:  0.16 m Systemic Diam: 2.10 cm Cherlynn Kaiser MD Electronically signed by Cherlynn Kaiser MD Signature Date/Time: 04/29/2020/9:50:53 PM    Final    IR PERCUTANEOUS ART THROMBECTOMY/INFUSION INTRACRANIAL INC DIAG ANGIO  Result Date: 04/27/2020 INDICATION: 80 year old male with past medical history significant for diabetes mellitus type 2, hypertension, hyperlipidemia and atrial fibrillation status post cardioversion in November 2021 on Xarelto. Patient had an episode of transient slurred speech and mild left arm weakness on 04/24/2020. Around noon on 04/17/2020, he developed significant left arm weakness, slurred speech and left facial droop. He was taken to outside hospital where he was found to have diminished opacification of the right MCA branches on CTA with area of increased T-max in the right MCA territory. He was taken to our service for a diagnostic cerebral angiogram and possible mechanical thrombectomy. EXAM: Ultrasound-guided vascular access Diagnostic cerebral angiogram Mechanical thrombectomy Intracranial stenting Flat panel head CT COMPARISON:  CT/CT angiogram of the head and neck April 25, 2020. MEDICATIONS: Refer to anesthesia documentation. ANESTHESIA/SEDATION: The procedure was  performed under general anesthesia. CONTRAST:  75 mL of Omnipaque 240 milligrams/mL FLUOROSCOPY TIME:  Fluoroscopy Time: 113 minutes 6 seconds (2,379 mGy). COMPLICATIONS: None immediate. TECHNIQUE: Informed written consent was obtained from the patient's sister after a thorough discussion of the procedural risks, benefits and alternatives. All questions were addressed. Maximal Sterile Barrier Technique was utilized including caps, mask, sterile gowns, sterile gloves, sterile drape, hand hygiene and skin antiseptic. A timeout was performed prior to the  initiation of the procedure. The right groin was prepped and draped in the usual sterile fashion. Using a micropuncture kit and the modified Seldinger technique, access was gained to the right common femoral artery and an 8 French sheath was placed. Real-time ultrasound guidance was utilized for vascular access including the acquisition of a permanent ultrasound image documenting patency of the accessed vessel. Under fluoroscopy, a 6 [place] Berenstein 2 catheter was navigated over a 0.035" Terumo Glidewire into the aortic arch. The catheter was placed into the right common carotid artery and then advanced into the right internal carotid artery. Frontal and lateral angiograms of the head were obtained. FINDINGS: 1. Near occlusion of a proximal right M2/MCA middle division branch with minimal delayed contrast penetration of distal branches. 2. Atherosclerotic changes of the right carotid siphon without hemodynamically significant stenosis. 3. Atherosclerotic changes of the right common femoral artery without hemodynamically significant stenosis. PROCEDURE: Under biplane roadmap, the Berenstein 2 catheter was exchanged over the wire for a zoom 88 guide catheter. The catheter was placed in the upper cervical segment of the right ICA. A zoom 71 catheter was then navigated through the guide catheter into the petrous segment of the right ICA. Magnified frontal and lateral  views of the head were obtained. Then a phenom 21 microcatheter was navigated over a synchro support microguidewire into the right M1/MCA. Multiple attempts to gain access to the right M2 branch proved unsuccessful. The synchro support micro guidewire was then exchanged for a 0.016" headliner micro guidewire which was advanced into the distal right M2/MCA branch. The microcatheter was then advanced over the wire. The microwire was removed and a 4 x 40 solitaire thrombectomy device was deployed spanning the right M2/MCA middle division branch and distal M1 segment. The device was allowed to intercalated with the clot for 4 minutes. The aspiration catheter was advanced to the level of occlusion. The microcatheter was removed. The aspiration catheter and thrombectomy device were removed under constant aspiration. Follow-up right ICA angiograms with magnified frontal and lateral views of the head showed brisk opacification of the right M2/MCA middle division branch with irregularity noted at the branch origin [facility]). Delayed follow-up angiogram showed branch reocclusion. Under biplane roadmap, the zoom 71 aspiration catheter was again navigated over a phenom 21 microcatheter and a headliner microguidewire into the cavernous segment of the right ICA. The microcatheter was then navigated over the wire into the right M2/MCA middle division branch. Then, a 4 x 40 mm solitaire stent retriever was deployed spanning the M2 and distal M1 segments. The device was allowed to intercalated with the clot for 4 minutes. The microcatheter was removed. The aspiration catheter was advanced to the level of occlusion and connected to a penumbra aspiration pump. The thrombectomy device and aspiration catheter were removed under constant aspiration. Follow-up right ICA angiograms with magnified frontal and lateral views of the head showed brisk opacification of the right M2/MCA middle division branch with irregularity noted at the branch  origin [facility]). Delayed follow-up angiogram showed branch reocclusion. Under biplane roadmap, the zoom 71 aspiration catheter was again navigated over a phenom 21 microcatheter and a headliner microguidewire into the cavernous segment of the right ICA. The microcatheter was then navigated over the wire into the right M2/MCA middle division branch. Then, a 5 x 37 mm embotrap stent retriever was deployed spanning the M2 and distal M1 segments. The device was allowed to intercalated with the clot for 4 minutes. The microcatheter was removed. The aspiration catheter was advanced  to the level of occlusion and connected to a penumbra aspiration pump. The thrombectomy device and aspiration catheter were removed under constant aspiration. Follow-up right ICA angiograms with magnified frontal and lateral views of the head showed recanalization of the right M2/MCA middle division branch with hairline stenosis at its origin due to atherosclerotic plaque and stenosis at the mid M2 segment with very delayed opacification of distal branches. Flat panel CT of the head was obtained and post processed in a separate workstation with concurrent attending physician supervision. Selected images were sent to PACS. No evidence of hemorrhagic complication noted. Under biplane roadmap, the zoom 71 aspiration catheter was again navigated over a phenom 21 microcatheter and a Aristotle 14 microguidewire into the cavernous segment of the right ICA. The microcatheter was then navigated over the wire into the right M2/MCA middle division branch. The Aristotle microguidewire was exchanged for a an exchange length synchro microguidewire. At this point, patient developed severe bradycardia and eventually asystole. The microwire and microcatheter were removed. The table was lowered to start chest compressions. However, patient had spontaneous return of circulation before the start of resuscitation efforts. Follow-up right ICA angiograms with  magnified frontal and lateral views of the head again showed recanalization of the right M2/MCA middle division branch with with hairline stenosis. No evidence of contrast extravasation. Under biplane roadmap, an SL 10 microcatheter was navigated over Aristotle 14 microguidewire into the right M2/MCA middle division branch. Subsequently, a 4.5 x 30 mm neuroform atlas stent was deployed spanning the distal right M1, proximal and mid M2/MCA middle division branch. Follow-up angiogram showed brisk flow through the entire right MCA territory. The catheter construct was subsequently withdrawn. A right common femoral angiogram was performed in right anterior oblique view. The puncture is at the common femoral artery level. Atherosclerotic plaques are noted without hemodynamically significant stenosis. The sheath was exchanged for an 8 [place] Angio-Seal which was utilized for access closure. Immediate hemostasis was achieved. IMPRESSION: Mechanical thrombectomy and intracranial stenting performed for treatment of a proximal right M2/MCA middle division branch near occlusion with superimposed atherosclerotic plaque. A total of 3 passes performed with stent retriever followed by intracranial stenting. Intracranial stenting performed after bolus infusion of cangrelor followed by low-dose protocol drip. PLAN: Patient remained intubated and transferred to ICU given hemodynamic instability. Continue cangrelor drip until transition to aspirin and Brilinta. Family updated by telephone immediately after the end of the procedure. Electronically Signed   By: Pedro Earls M.D.   On: 04/27/2020 10:36   CT ANGIO HEAD CODE STROKE  Result Date: 04/17/2020 CLINICAL DATA:  Slurred speech and left-sided weakness. EXAM: CT ANGIOGRAPHY HEAD AND NECK CT PERFUSION BRAIN TECHNIQUE: Multidetector CT imaging of the head and neck was performed using the standard protocol during bolus administration of intravenous contrast.  Multiplanar CT image reconstructions and MIPs were obtained to evaluate the vascular anatomy. Carotid stenosis measurements (when applicable) are obtained utilizing NASCET criteria, using the distal internal carotid diameter as the denominator. Multiphase CT imaging of the brain was performed following IV bolus contrast injection. Subsequent parametric perfusion maps were calculated using RAPID software. CONTRAST:  5m OMNIPAQUE IOHEXOL 350 MG/ML SOLN COMPARISON:  None. FINDINGS: CT HEAD FINDINGS Brain: There is no evidence of an acute infarct, intracranial hemorrhage, mass, midline shift, or extra-axial fluid collection. Mild cerebral atrophy is within normal limits for age. Patchy hypodensities in the cerebral white matter bilaterally are nonspecific but compatible with mild chronic small vessel ischemic disease. There is a chronic  lacunar infarct at the inferior aspect of the right caudate head. Vascular: Calcified atherosclerosis at the skull base. No hyperdense vessel. Skull: No fracture or suspicious osseous lesion. Sinuses/Orbits: Mild scattered mucosal thickening in the paranasal sinuses. Clear mastoid air cells. Bilateral cataract extraction. Other: None. ASPECTS ([place] Stroke Program Early CT Score) - Ganglionic level infarction (caudate, lentiform nuclei, internal capsule, insula, M1-M3 cortex): 7 - Supraganglionic infarction (M4-M6 cortex): 3 Total score (0-10 with 10 being normal): 10 Review of the MIP images confirms the above findings CTA NECK FINDINGS Aortic arch: Normal variant aortic arch branching pattern with common origin of the brachiocephalic and left common carotid arteries. Mild calcified plaque in the aortic arch without evidence of a significant arch vessel origin stenosis. Right carotid system: Patent with mild scattered predominantly calcified plaque throughout the common carotid artery and at the carotid bifurcation. No evidence of a significant stenosis or dissection. Left carotid  system: Patent with mild plaque in the mid common carotid artery not resulting in significant stenosis. More extensive, heavily calcified plaque at the carotid bifurcation results in less than 50% proximal ICA stenosis. No evidence of dissection. Vertebral arteries: There is either severe stenosis or short segment occlusion of the proximal left vertebral artery at its origin with the vessel being patent throughout the remainder of the neck. There is a diffusely thready appearance of the right vertebral artery V1 and V2 segments with the right V3 segment being occluded. Skeleton: Advanced cervical disc and facet degeneration. Other neck: No evidence of cervical lymphadenopathy or mass. Upper chest: Partially visualized small right pleural effusion. No apical lung consolidation. Review of the MIP images confirms the above findings CTA HEAD FINDINGS Anterior circulation: The internal carotid arteries are patent from skull base to carotid termini with calcified plaque resulting in mild cavernous and proximal supraclinoid stenosis bilaterally. The right M1 segment is patent with a mild stenosis proximally. There is a decreased number of distal right MCA branch vessels compared to the left, however a discrete proximal branch occlusion is not identified. The left MCA is patent with branch vessel irregularity as well as a mild-to-moderate proximal M2 stenosis. Both ACAs are patent without evidence of a significant proximal stenosis. No aneurysm is identified. Posterior circulation: The intracranial left vertebral artery is patent but diffusely irregular and narrowed with multiple moderate to severe stenoses. The right V4 segment is occluded proximally with likely retrograde filling distally supplying the right PICA. 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 Patent SCAs are seen bilaterally. There is a fetal  origin of the PCAs without evidence of a significant proximal PCA stenosis. No aneurysm is identified. Venous sinuses: Patent. Anatomic variants: Fetal origin of the PCAs. Review of the MIP images confirms the above findings CT Brain Perfusion Findings: ASPECTS: 10 CBF (<30%) Volume: 0 mL Perfusion (Tmax>6.0s) volume: 55 mL Mismatch Volume: 55 mL Infarction Location: No core infarct evident by CTP. Ischemic penumbra in the right MCA territory, greatest in the frontal lobe at and superior to the operculum though with parietal and temporal lobe involvement as well. IMPRESSION: CT HEAD: 1. No evidence of acute intracranial abnormality.  ASPECTS of 10. 2. Mild chronic small vessel ischemic disease. CTA HEAD AND NECK: 1. Decreased number of right MCA branch vessels without a discrete proximal occlusion identified. It is possible that this reflects a stump occlusion of a proximal to mid M2 branch which is not readily apparent by  CTA. 2. Intracranial atherosclerosis with multiple anterior and posterior circulation stenoses as detailed above. 3. 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 4. Severe stenosis or short segment occlusion of the left vertebral artery at its origin with patency of the vessel more distally. Heavily diseased left V4 segment. 5. Cervical carotid atherosclerosis without significant stenosis. 6.  Aortic Atherosclerosis (ICD10-I70.0). CT BRAIN PERFUSION: 55 mL of ischemic penumbra in the right MCA territory without evidence of a core infarct. These results were called by telephone at the time of interpretation on 04/23/2020 at 2:15 pm to Dr. Donnetta Simpers, who verbally acknowledged these results. Electronically Signed   By: Logan Bores M.D.   On: 04/15/2020 14:58   CT ANGIO NECK CODE STROKE  Result Date: 04/13/2020 CLINICAL DATA:  Slurred speech and left-sided weakness. EXAM: CT ANGIOGRAPHY HEAD AND NECK CT PERFUSION BRAIN TECHNIQUE: Multidetector CT imaging of the head and neck was  performed using the standard protocol during bolus administration of intravenous contrast. Multiplanar CT image reconstructions and MIPs were obtained to evaluate the vascular anatomy. Carotid stenosis measurements (when applicable) are obtained utilizing NASCET criteria, using the distal internal carotid diameter as the denominator. Multiphase CT imaging of the brain was performed following IV bolus contrast injection. Subsequent parametric perfusion maps were calculated using RAPID software. CONTRAST:  72m OMNIPAQUE IOHEXOL 350 MG/ML SOLN COMPARISON:  None. FINDINGS: CT HEAD FINDINGS Brain: There is no evidence of an acute infarct, intracranial hemorrhage, mass, midline shift, or extra-axial fluid collection. Mild cerebral atrophy is within normal limits for age. Patchy hypodensities in the cerebral white matter bilaterally are nonspecific but compatible with mild chronic small vessel ischemic disease. There is a chronic lacunar infarct at the inferior aspect of the right caudate head. Vascular: Calcified atherosclerosis at the skull base. No hyperdense vessel. Skull: No fracture or suspicious osseous lesion. Sinuses/Orbits: Mild scattered mucosal thickening in the paranasal sinuses. Clear mastoid air cells. Bilateral cataract extraction. Other: None. ASPECTS ([place]roke Program Early CT Score) - Ganglionic level infarction (caudate, lentiform nuclei, internal capsule, insula, M1-M3 cortex): 7 - Supraganglionic infarction (M4-M6 cortex): 3 Total score (0-10 with 10 being normal): 10 Review of the MIP images confirms the above findings CTA NECK FINDINGS Aortic arch: Normal variant aortic arch branching pattern with common origin of the brachiocephalic and left common carotid arteries. Mild calcified plaque in the aortic arch without evidence of a significant arch vessel origin stenosis. Right carotid system: Patent with mild scattered predominantly calcified plaque throughout the common carotid artery and at  the carotid bifurcation. No evidence of a significant stenosis or dissection. Left carotid system: Patent with mild plaque in the mid common carotid artery not resulting in significant stenosis. More extensive, heavily calcified plaque at the carotid bifurcation results in less than 50% proximal ICA stenosis. No evidence of dissection. Vertebral arteries: There is either severe stenosis or short segment occlusion of the proximal left vertebral artery at its origin with the vessel being patent throughout the remainder of the neck. There is a diffusely thready appearance of the right vertebral artery V1 and V2 segments with the right V3 segment being occluded. Skeleton: Advanced cervical disc and facet degeneration. Other neck: No evidence of cervical lymphadenopathy or mass. Upper chest: Partially visualized small right pleural effusion. No apical lung consolidation. Review of the MIP images confirms the above findings CTA HEAD FINDINGS Anterior circulation: The internal carotid arteries are patent from skull base to carotid termini with calcified plaque resulting in mild cavernous and proximal supraclinoid  stenosis bilaterally. The right M1 segment is patent with a mild stenosis proximally. There is a decreased number of distal right MCA branch vessels compared to the left, however a discrete proximal branch occlusion is not identified. The left MCA is patent with branch vessel irregularity as well as a mild-to-moderate proximal M2 stenosis. Both ACAs are patent without evidence of a significant proximal stenosis. No aneurysm is identified. Posterior circulation: The intracranial left vertebral artery is patent but diffusely irregular and narrowed with multiple moderate to severe stenoses. The right V4 segment is occluded proximally with likely retrograde filling distally supplying the right PICA. 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 Patent SCAs are seen bilaterally. There is a fetal origin of the PCAs without evidence of a significant proximal PCA stenosis. No aneurysm is identified. Venous sinuses: Patent. Anatomic variants: Fetal origin of the PCAs. Review of the MIP images confirms the above findings CT Brain Perfusion Findings: ASPECTS: 10 CBF (<30%) Volume: 0 mL Perfusion (Tmax>6.0s) volume: 55 mL Mismatch Volume: 55 mL Infarction Location: No core infarct evident by CTP. Ischemic penumbra in the right MCA territory, greatest in the frontal lobe at and superior to the operculum though with parietal and temporal lobe involvement as well. IMPRESSION: CT HEAD: 1. No evidence of acute intracranial abnormality.  ASPECTS of 10. 2. Mild chronic small vessel ischemic disease. CTA HEAD AND NECK: 1. Decreased number of right MCA branch vessels without a discrete proximal occlusion identified. It is possible that this reflects a stump occlusion of a proximal to mid M2 branch which is not readily apparent by CTA. 2. Intracranial atherosclerosis with multiple anterior and posterior circulation stenoses as detailed above. 3. 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 4. Severe stenosis or short segment occlusion of the left vertebral artery at its origin with patency of the vessel more distally. Heavily diseased left V4 segment. 5. Cervical carotid atherosclerosis without significant stenosis. 6.  Aortic Atherosclerosis (ICD10-I70.0). CT BRAIN PERFUSION: 55 mL of ischemic penumbra in the right MCA territory without evidence of a core infarct. These results were called by telephone at the time of interpretation on 04/18/2020 at 2:15 pm to Dr. Donnetta Simpers, who verbally acknowledged these results. Electronically Signed   By: Logan Bores M.D.   On: 04/14/2020 14:58    Microbiology Recent Results (from the past 240 hour(s))  Resp Panel by RT-PCR (Flu A&B, Covid) Nasopharyngeal Swab     Status:  None   Collection Time: 05/01/2020  2:18 PM   Specimen: Nasopharyngeal Swab; Nasopharyngeal(NP) swabs in vial transport medium  Result Value Ref Range Status   SARS Coronavirus 2 by RT PCR NEGATIVE NEGATIVE Final    Comment: (NOTE) SARS-CoV-2 target nucleic acids are NOT DETECTED.  The SARS-CoV-2 RNA is generally detectable in upper respiratory specimens during the acute phase of infection. The lowest concentration of SARS-CoV-2 viral copies this assay can detect is 138 copies/mL. A negative result does not preclude SARS-Cov-2 infection and should not be used as the sole basis for treatment or other patient management decisions. A negative result may occur with  improper specimen collection/handling, submission of specimen other than nasopharyngeal swab, presence of viral mutation(s) within the areas targeted by this assay, and inadequate number of viral copies(<138 copies/mL). A negative result must be combined with clinical observations, patient history, and epidemiological information. The expected result is Negative.  Fact Sheet for Patients:  EntrepreneurPulse.com.au  Fact Sheet for Healthcare Providers:  IncredibleEmployment.be  This test is no t yet approved or cleared by the [place] FDA and  has been authorized for detection and/or diagnosis of SARS-CoV-2 by FDA under an Emergency Use Authorization (EUA). This EUA will remain  in effect (meaning this test can be used) for the duration of the COVID-19 declaration under Section 564(b)(1) of the Act, 21 U.S.C.section 360bbb-3(b)(1), unless the authorization is terminated  or revoked sooner.       Influenza A by PCR NEGATIVE NEGATIVE Final   Influenza B by PCR NEGATIVE NEGATIVE Final    Comment: (NOTE) The Xpert Xpress SARS-CoV-2/FLU/RSV plus assay is intended as an aid in the diagnosis of influenza from Nasopharyngeal swab specimens and should not be used as a sole basis for  treatment. Nasal washings and aspirates are unacceptable for Xpert Xpress SARS-CoV-2/FLU/RSV testing.  Fact Sheet for Patients: EntrepreneurPulse.com.au  Fact Sheet for Healthcare Providers: IncredibleEmployment.be  This test is not yet approved or cleared by the [place] FDA and has been authorized for detection and/or diagnosis of SARS-CoV-2 by FDA under an Emergency Use Authorization (EUA). This EUA will remain in effect (meaning this test can be used) for the duration of the COVID-19 declaration under Section 564(b)(1) of the Act, 21 U.S.C. section 360bbb-3(b)(1), unless the authorization is terminated or revoked.  Performed at [facility], [address] [place], [place] [address]   MRSA PCR Screening     Status: None   Collection Time: 04/27/2020  7:16 PM   Specimen: Nasopharyngeal  Result Value Ref Range Status   MRSA by PCR NEGATIVE NEGATIVE Final    Comment:        The GeneXpert MRSA Assay (FDA approved for NASAL specimens only), is one component of a comprehensive MRSA colonization surveillance program. It is not intended to diagnose MRSA infection nor to guide or monitor treatment for MRSA infections. Performed at [facility], [place] [address], [place], [place] [address]   Culture, Respiratory w Gram Stain     Status: None   Collection Time: 04/28/20 11:02 AM   Specimen: Tracheal Aspirate; Respiratory  Result Value Ref Range Status   Specimen Description TRACHEAL ASPIRATE  Final   Special Requests NONE  Final   Gram Stain   Final    ABUNDANT WBC PRESENT,BOTH PMN AND MONONUCLEAR ABUNDANT GRAM NEGATIVE RODS MODERATE GRAM POSITIVE COCCI RARE YEAST Performed at [facility], [place] [address] [place], [place] [address]    Culture   Final    ABUNDANT ENTEROBACTER AEROGENES ABUNDANT CITROBACTER KOSERI    Report Status 05/01/2020 FINAL  Final   Organism ID, Bacteria ENTEROBACTER AEROGENES  Final    Organism ID, Bacteria CITROBACTER KOSERI  Final      Susceptibility   Citrobacter koseri - MIC*    CEFAZOLIN <=4 SENSITIVE Sensitive     CEFEPIME <=0.12 SENSITIVE Sensitive     CEFTAZIDIME <=1 SENSITIVE Sensitive     CEFTRIAXONE <=0.25 SENSITIVE Sensitive     CIPROFLOXACIN <=0.25 SENSITIVE Sensitive     GENTAMICIN <=1 SENSITIVE Sensitive     IMIPENEM <=0.25 SENSITIVE Sensitive     TRIMETH/SULFA <=20 SENSITIVE Sensitive     PIP/TAZO <=4 SENSITIVE Sensitive     * ABUNDANT CITROBACTER KOSERI   Enterobacter aerogenes - MIC*    CEFAZOLIN >=64 RESISTANT Resistant     CEFEPIME <=0.12 SENSITIVE Sensitive     CEFTAZIDIME <=1 SENSITIVE Sensitive     CEFTRIAXONE <=0.25 SENSITIVE Sensitive  CIPROFLOXACIN <=0.25 SENSITIVE Sensitive     GENTAMICIN <=1 SENSITIVE Sensitive     IMIPENEM 2 SENSITIVE Sensitive     TRIMETH/SULFA <=20 SENSITIVE Sensitive     PIP/TAZO <=4 SENSITIVE Sensitive     * ABUNDANT ENTEROBACTER AEROGENES  Culture, blood (routine x 2)     Status: Abnormal   Collection Time: 04/28/20 11:31 AM   Specimen: BLOOD RIGHT HAND  Result Value Ref Range Status   Specimen Description BLOOD RIGHT HAND  Final   Special Requests   Final    BOTTLES DRAWN AEROBIC ONLY Blood Culture adequate volume   Culture  Setup Time   Final    AEROBIC BOTTLE ONLY GRAM POSITIVE COCCI CRITICAL RESULT CALLED TO, READ BACK BY AND VERIFIED WITHAlona Bene [facility] 04/29/20 0049 JDW Performed at [facility], [address], [place], [place] [address]    Culture STAPHYLOCOCCUS AUREUS (A)  Final   Report Status 04/30/2020 FINAL  Final   Organism ID, Bacteria STAPHYLOCOCCUS AUREUS  Final      Susceptibility   Staphylococcus aureus - MIC*    CIPROFLOXACIN <=0.5 SENSITIVE Sensitive     ERYTHROMYCIN <=0.25 SENSITIVE Sensitive     GENTAMICIN <=0.5 SENSITIVE Sensitive     OXACILLIN <=0.25 SENSITIVE Sensitive     TETRACYCLINE <=1 SENSITIVE Sensitive     VANCOMYCIN <=0.5 SENSITIVE Sensitive     TRIMETH/SULFA  <=10 SENSITIVE Sensitive     CLINDAMYCIN <=0.25 SENSITIVE Sensitive     RIFAMPIN <=0.5 SENSITIVE Sensitive     Inducible Clindamycin NEGATIVE Sensitive     * STAPHYLOCOCCUS AUREUS  Blood Culture ID Panel (Reflexed)     Status: Abnormal   Collection Time: 04/28/20 11:31 AM  Result Value Ref Range Status   Enterococcus faecalis NOT DETECTED NOT DETECTED Final   Enterococcus Faecium NOT DETECTED NOT DETECTED Final   Listeria monocytogenes NOT DETECTED NOT DETECTED Final   Staphylococcus species DETECTED (A) NOT DETECTED Final    Comment: CRITICAL RESULT CALLED TO, READ BACK BY AND VERIFIED WITH: V BRYK PHARMD 04/29/20 0049 JDW    Staphylococcus aureus (BCID) DETECTED (A) NOT DETECTED Final    Comment: CRITICAL RESULT CALLED TO, READ BACK BY AND VERIFIED WITH: V BRYK PHARMD 04/29/20 0049 JDW    Staphylococcus epidermidis NOT DETECTED NOT DETECTED Final   Staphylococcus lugdunensis NOT DETECTED NOT DETECTED Final   Streptococcus species NOT DETECTED NOT DETECTED Final   Streptococcus agalactiae NOT DETECTED NOT DETECTED Final   Streptococcus pneumoniae NOT DETECTED NOT DETECTED Final   Streptococcus pyogenes NOT DETECTED NOT DETECTED Final   A.calcoaceticus-baumannii NOT DETECTED NOT DETECTED Final   Bacteroides fragilis NOT DETECTED NOT DETECTED Final   Enterobacterales NOT DETECTED NOT DETECTED Final   Enterobacter cloacae complex NOT DETECTED NOT DETECTED Final   Escherichia coli NOT DETECTED NOT DETECTED Final   Klebsiella aerogenes NOT DETECTED NOT DETECTED Final   Klebsiella oxytoca NOT DETECTED NOT DETECTED Final   Klebsiella pneumoniae NOT DETECTED NOT DETECTED Final   Proteus species NOT DETECTED NOT DETECTED Final   Salmonella species NOT DETECTED NOT DETECTED Final   Serratia marcescens NOT DETECTED NOT DETECTED Final   Haemophilus influenzae NOT DETECTED NOT DETECTED Final   Neisseria meningitidis NOT DETECTED NOT DETECTED Final   Pseudomonas aeruginosa NOT DETECTED NOT  DETECTED Final   Stenotrophomonas maltophilia NOT DETECTED NOT DETECTED Final   Candida albicans NOT DETECTED NOT DETECTED Final   Candida auris NOT DETECTED NOT DETECTED Final   Candida glabrata NOT DETECTED  NOT DETECTED Final   Candida krusei NOT DETECTED NOT DETECTED Final   Candida parapsilosis NOT DETECTED NOT DETECTED Final   Candida tropicalis NOT DETECTED NOT DETECTED Final   Cryptococcus neoformans/gattii NOT DETECTED NOT DETECTED Final   Meth resistant mecA/C and MREJ NOT DETECTED NOT DETECTED Final    Comment: Performed at [facility], [place] [address] [place], [place] [address]  Culture, blood (routine x 2)     Status: Abnormal   Collection Time: 04/28/20 11:33 AM   Specimen: BLOOD RIGHT HAND  Result Value Ref Range Status   Specimen Description BLOOD RIGHT HAND  Final   Special Requests   Final    BOTTLES DRAWN AEROBIC ONLY Blood Culture adequate volume   Culture  Setup Time   Final    AEROBIC BOTTLE ONLY GRAM POSITIVE COCCI CRITICAL VALUE NOTED.  VALUE IS CONSISTENT WITH PREVIOUSLY REPORTED AND CALLED VALUE.    Culture (A)  Final    STAPHYLOCOCCUS AUREUS SUSCEPTIBILITIES PERFORMED ON PREVIOUS CULTURE WITHIN THE LAST 5 DAYS. Performed at [facility], [place] [address], [place], [place] [address]    Report Status 04/30/2020 FINAL  Final  Urine Culture     Status: Abnormal   Collection Time: 04/28/20 12:23 PM   Specimen: Urine, Random  Result Value Ref Range Status   Specimen Description URINE, RANDOM  Final   Special Requests   Final    NONE Performed at [facility], [place] [address], [place], [place] [address]    Culture >=100,000 COLONIES/mL STAPHYLOCOCCUS AUREUS (A)  Final   Report Status 04/30/2020 FINAL  Final   Organism ID, Bacteria STAPHYLOCOCCUS AUREUS (A)  Final      Susceptibility   Staphylococcus aureus - MIC*    CIPROFLOXACIN <=0.5 SENSITIVE Sensitive     GENTAMICIN <=0.5 SENSITIVE Sensitive     NITROFURANTOIN <=16 SENSITIVE Sensitive      OXACILLIN 0.5 SENSITIVE Sensitive     TETRACYCLINE <=1 SENSITIVE Sensitive     VANCOMYCIN <=0.5 SENSITIVE Sensitive     TRIMETH/SULFA <=10 SENSITIVE Sensitive     CLINDAMYCIN <=0.25 SENSITIVE Sensitive     RIFAMPIN <=0.5 SENSITIVE Sensitive     Inducible Clindamycin NEGATIVE Sensitive     * >=100,000 COLONIES/mL STAPHYLOCOCCUS AUREUS  Culture, blood (Routine X 2) w Reflex to ID Panel     Status: None (Preliminary result)   Collection Time: 04/30/20 12:41 AM   Specimen: BLOOD RIGHT ARM  Result Value Ref Range Status   Specimen Description BLOOD RIGHT ARM  Final   Special Requests   Final    BOTTLES DRAWN AEROBIC AND ANAEROBIC Blood Culture adequate volume   Culture   Final    NO GROWTH 1 DAY Performed at [facility], [address] [place], [place] [address]    Report Status PENDING  Incomplete  Culture, blood (Routine X 2) w Reflex to ID Panel     Status: None (Preliminary result)   Collection Time: 04/30/20 12:45 AM   Specimen: BLOOD  Result Value Ref Range Status   Specimen Description BLOOD RIGHT ANTECUBITAL  Final   Special Requests   Final    BOTTLES DRAWN AEROBIC AND ANAEROBIC Blood Culture adequate volume   Culture   Final    NO GROWTH 1 DAY Performed at [facility], [place] [address], [place], [place] [address]    Report Status PENDING  Incomplete    Lab Basic Metabolic Panel: Recent Labs  Lab 04/27/20  1632 04/28/20 0500 04/28/20 1108 04/28/20 2100 04/29/20 0604 04/30/20 0042 04/30/20 1700 05/01/20 0512 05/01/20 1600 05/01/20 2101 05/01/20 2330 05-21-2020 0310 05-21-2020 0358 2020/05/21 0538 May 21, 2020 0800  NA  --  141  --   --    < > 141 139 136 135   < > 134* 134* 136 135 137  K  --  6.1*   < >  --    < > 4.5 4.9 5.4* 6.1*   < > 6.2* 5.6* 5.7* 5.3* 5.2*  CL  --  109  --   --    < > 103 104 101 100  --   --   --  100  --   --   CO2  --  15*  --   --    < > 22 20* 21* 19*  --   --   --  13*  --   --   GLUCOSE  --  274*  --   --    < > 104*  112* 175* 133*  --   --   --  84  --   --   BUN  --  132*  --   --    < > 103* 102* 83* 75*  --   --   --  57*  --   --   CREATININE  --  4.38*  --   --    < > 3.99* 3.83* 3.14* 2.91*  --   --   --  2.67*  --   --   CALCIUM  --  7.8*  --   --    < > 7.3* 6.9* 7.0* 6.9*  --   --   --  6.5*  --   --   MG 2.3 2.3  --  2.2  --   --   --  2.5*  --   --   --   --  3.0*  --   --   PHOS 8.4* 9.3*  --   --   --   --  7.8* 7.2* 7.8*  --   --   --  9.9*  --   --    < > = values in this interval not displayed.   Liver Function Tests: Recent Labs  Lab 04/30/20 1700 05/01/20 0512 05/01/20 1600 05-21-2020 0358  AST  --   --   --  >10,000*  ALT  --   --   --  4,611*  ALKPHOS  --   --   --  293*  BILITOT  --   --   --  3.2*  PROT  --   --   --  4.7*  ALBUMIN 1.7* 1.6* 1.6* 1.6*  1.6*   No results for input(s): LIPASE, AMYLASE in the last 168 hours. No results for input(s): AMMONIA in the last 168 hours. CBC: Recent Labs  Lab 04/28/20 0500 04/29/20 0604 04/30/20 0042 05/01/20 0848 05/01/20 2101 05/01/20 2330 05/21/2020 0310 05/21/20 0538 05-21-20 0557 May 21, 2020 0800  WBC 15.2* 16.4* 14.7* 14.3*  --   --   --   --  17.5*  --   HGB 8.6* 8.3* 9.0* 8.8*   < > 9.9* 9.2* 9.2* 8.2* 8.2*  HCT 28.0* 27.1* 28.3* 27.4*   < > 29.0* 27.0* 27.0* 27.4* 24.0*  MCV 91.2 89.4 87.1 87.5  --   --   --   --  94.5  --   PLT 191 146* 142*  127*  --   --   --   --  97*  --    < > = values in this interval not displayed.   Cardiac Enzymes: Recent Labs  Lab 04/28/20 1721  CKTOTAL 124   Sepsis Labs: Recent Labs  Lab 04/29/20 0604 04/30/20 0042 05/01/20 0848 2020-05-31 0433 May 31, 2020 0557 2020/05/31 0634  WBC 16.4* 14.7* 14.3*  --  17.5*  --   LATICACIDVEN  --   --   --  >11.0*  --  >11.0*    Procedures/Operations   Lenice Llamas, MD Pulmonary and [place] Pager: see AMION [phone]

## 2020-05-05 NOTE — Progress Notes (Signed)
[facility] Progress Note Patient Name: Robert Daniel DOB: December 28, 1940 MRN: [id number]   Date of Service  May 15, 2020  HPI/Events of Note  Multiple issues: 1. Hypoglycemia - Blood glucose = 11 and ABG on 50%/PRVC 30/TV 630/P 10 = 7.23/39.1/109.  eICU Interventions  Plan: 1. D5 NaHCO3 IV infusion to run IV at 75 mL/hour.  2. Repeat ABG at 5 AM.     Intervention Category Major Interventions: Acid-Base disturbance - evaluation and management;Respiratory failure - evaluation and management;Other:  Ezra Marquess Cornelia Copa May 15, 2020, 12:12 AM

## 2020-05-05 NOTE — Progress Notes (Signed)
Critical Lactic acid and troponin results relayed to E Link.

## 2020-05-05 NOTE — Progress Notes (Addendum)
[facility] Progress Note Patient Name: Robert Daniel DOB: [birth year] MRN: [id number]   Date of Service  May 17, 2020  HPI/Events of Note  ABG on 50%/PRVC 30/TV 630/P 10 = 7.087/41.2/89. Troponin = 445. AST and ALT markedly elevated. INR = 3.0 . No need for Heparin IV infusion in light of INR = 3.0.   eICU Interventions  Plan: 1. Increase PRVC rate to 35. 2. NaHCO3 200 meq IV now. 3. Increase NaHCO3 IV infusion to 150 mL/hour. 4. Repeat ABG at 7:30 AM.     Intervention Category Major Interventions: Acid-Base disturbance - evaluation and management;Respiratory failure - evaluation and management  [name] 05-17-20, 5:56 AM

## 2020-05-05 NOTE — Progress Notes (Signed)
RT extubated patient to comfort per MD order and family wishes with RN at bedside.

## 2020-05-05 NOTE — Progress Notes (Signed)
NAME:  Robert Daniel, MRN:  [id number], DOB:  1940-04-26, LOS: 7 ADMISSION DATE:  04/23/2020, CONSULTATION DATE:  05/01/2020 REFERRING MD:  Erlinda Hong - neuro, CHIEF COMPLAINT:  R MCA CVA, respiratory failure  Brief History:  80 yo R MCA CVA, s/p stent in NIR, remains intubated  History of Present Illness:  80 yo M PMH Afib on xarelto, HTN, DM2, rpesented to [facility] 2/19 with weakness and slurred speech. Sx began 2/18, but resolved, until 2/19 approx noon when he had L arm weakness, slurred speech, L facial droop. Last took xarelto 2/18 evening. Found to have R MCA CVA Transferred to [facility], and taken urgently to NIR for mechanical thrombectomy of proximal R M2/MCA occlusion, with subsequent reocclusion x 3, followed by intracranial stent deployment with TICI3 recanalization. NIR course also c/b pt instability-- with hypotension,  pause vs asystole with spontaneous ROSC following a prop bolus. He received 2 PRBC in NIR for hypotension; as well as albumin and neo. Remains intubated, transferring to ICU. Received repeat paralytic prior to ICU transfer   Past Medical History:  HTN Afib HLD DM2  Significant Hospital Events:  2/19 [facility] ED as code stroke. R MCA CVA. Transferred to [facility] for NIR. Brief asystolic arrest with spontaneous ROSC vs pause in NIR. Remains intubated 2/19 Mechanical thrombectomy right M2 occlusion with TICI-3 flow.  Consults:  NIR PCCM  Procedures:  2/19 ETT> 2/19 Art line>  2/22 temporary right HD catheter.  Significant Diagnostic Tests:  2/19 CTA H> R MCA proximal occlusion   2/23 Progressed size and extent of infarcts widely scattered in the right MCA and PCA territories since 04/26/2020. Associated petechial hemorrhage as suspected by CT. No malignant hemorrhagic transformation. Mild mass effect with no midline shift at this time.  Small new lacunar type infarcts in the left cerebellar vermis and right midbrain. Several small left MCA territory cortical infarcts again  noted.  Stable intracranial MRA from 2 days ago. No large vessel occlusion. Stable MRA appearance of right MCA M2 stent.  2/23 TEE: normal LV, RV function, trace AI, mild MR, mild TR. Tricuspid valve endocarditis.  2/23 EEG: diffuse encephalopathy but without active seizures.  Micro Data:  [id number] neg 2/22 - Blood and urine cultures positive for MSSA  Antimicrobials:  2/22 ceftriaxone >> 2/23 nafcillin   Interim History / Subjective:   Worsening shock overnight. Worsening acidosis. Not responsive. Hypoxemic. Made DNR overnight after overnight team discussed with sister.  Objective   Blood pressure 98/77, pulse (!) 117, temperature (!) 96.2 F (35.7 C), temperature source Axillary, resp. rate (!) 35, height '6\' 3"'$  (1.905 m), weight (!) 144.1 kg, SpO2 (!) 82 %. CVP:  [10 mmHg-19 mmHg] 19 mmHg  Vent Mode: PRVC FiO2 (%):  [40 %-50 %] 50 % Set Rate:  [22 bmp-35 bmp] 35 bmp Vt Set:  [630 mL] 630 mL PEEP:  [10 cmH20] 10 cmH20 Plateau Pressure:  [18 cmH20-26 cmH20] 26 cmH20   Intake/Output Summary (Last 24 hours) at May 25, 2020 1412 Last data filed at 25-May-2020 0700 Gross per 24 hour  Intake 3848.58 ml  Output 2351 ml  Net 1497.58 ml   Filed Weights   05/01/20 0500 May 25, 2020 0305 05/25/20 0907  Weight: (!) 141.2 kg (!) 144.1 kg (!) 144.1 kg    Examination: General: obese man intubated, in [employer]. Appears moribund HENT: ETT in palce Lungs: Mechanically ventilated, lungs are clear, minimal secretions Cardiovascular:  Tachycardic, irregular Abdomen: Obese soft round non-distended Extremities: anasarca Neuro: not responsive.  Resolved Hospital Problem list     Assessment & Plan:   Septic  Shock secondary to right sided MSSA endocarditis HFrEF EF 45% - maintain norepinephrine, vasopressin, on phenylephrine as well - suspect some component of cardiogenic shock here as well - continuous Nafcillian, appreciate ID consultation  Critically ill due to acute ischemic  stroke S/P s/p mechanical thrombectomy-- subsequent reocclusion x 3, stent placement with TICI3, requiring mechanical ventilation for airway protection  - continue asa, brillinta  - was on xarelto prior to admission, will defer to neuro on when appropriate to resume - EEG 2/23 demonstrates no seizures,right hemispheric cortical dysfunction, nonspecific.  Acute hypoxemic respiratory failure - Continue full ventilatory support - daily SBT. No likely to tolerate PSV until after fluid removal   AKI on CKD - now on CRRT Now with worsening anion gap metabolic acidosis Continue CRRT with more aggressive volume removal to facilitate ventilator weaning  Toxic metabolic and uremic encephalopathy.  - multifactorial in the setting of sepsis, stroke - continue CRRT - delirium precautions - minimize sedation  Anemia of acute illness.   DM2 - monitor CBGs - SSI  Diarrhea - watery, C. Difficile sent   Daily Goals Checklist  Pain/Anxiety/Delirium protocol (if indicated): wean precedex to off , keep RASS 0  VAP protocol (if indicated): bundle in place DVT prophylaxis: SCD's, start [place] heparin Nutrition Status: continue enteral nutrition.   GI prophylaxis: protonix Glucose control: improved on tube feeds.  Mobility/therapy needs: bedrest  Code Status: DNR Family Communication: discussed with sister [name] over the phone this morning. She wished to make [facility] comfort care, and wouldn't be able to make it down here. She would like to facetime and see him.  Disposition: ICU   Labs   CBC: Recent Labs  Lab 04/22/2020 1417 04/30/2020 1609 04/28/20 0500 04/29/20 0604 04/30/20 0042 05/01/20 0848 05/01/20 2101 05/01/20 2330 05-22-2020 0310 2020-05-22 0538 2020-05-22 0557 2020-05-22 0800  WBC 16.8*   < > 15.2* 16.4* 14.7* 14.3*  --   --   --   --  17.5*  --   NEUTROABS 15.3*  --   --   --   --   --   --   --   --   --   --   --   HGB 8.1*   < > 8.6* 8.3* 9.0* 8.8*   < > 9.9* 9.2* 9.2* 8.2* 8.2*  HCT  25.0*   < > 28.0* 27.1* 28.3* 27.4*   < > 29.0* 27.0* 27.0* 27.4* 24.0*  MCV 88.0   < > 91.2 89.4 87.1 87.5  --   --   --   --  94.5  --   PLT 251   < > 191 146* 142* 127*  --   --   --   --  97*  --    < > = values in this interval not displayed.    Basic Metabolic Panel: Recent Labs  Lab 04/27/20 1632 04/28/20 0500 04/28/20 1108 04/28/20 2100 04/29/20 0604 04/30/20 0042 04/30/20 1700 05/01/20 0512 05/01/20 1600 05/01/20 2101 05/01/20 2330 May 22, 2020 0310 05-22-20 0358 May 22, 2020 0538 May 22, 2020 0800  NA  --  141  --   --    < > 141 139 136 135   < > 134* 134* 136 135 137  K  --  6.1*   < >  --    < > 4.5 4.9 5.4* 6.1*   < > 6.2* 5.6* 5.7* 5.3* 5.2*  CL  --  109  --   --    < > 103 104 101 100  --   --   --  100  --   --   CO2  --  15*  --   --    < > 22 20* 21* 19*  --   --   --  13*  --   --   GLUCOSE  --  274*  --   --    < > 104* 112* 175* 133*  --   --   --  84  --   --   BUN  --  132*  --   --    < > 103* 102* 83* 75*  --   --   --  57*  --   --   CREATININE  --  4.38*  --   --    < > 3.99* 3.83* 3.14* 2.91*  --   --   --  2.67*  --   --   CALCIUM  --  7.8*  --   --    < > 7.3* 6.9* 7.0* 6.9*  --   --   --  6.5*  --   --   MG 2.3 2.3  --  2.2  --   --   --  2.5*  --   --   --   --  3.0*  --   --   PHOS 8.4* 9.3*  --   --   --   --  7.8* 7.2* 7.8*  --   --   --  9.9*  --   --    < > = values in this interval not displayed.   GFR: Estimated Creatinine Clearance: 33.8 mL/min (A) (by C-G formula based on SCr of 2.67 mg/dL (H)). Recent Labs  Lab 04/29/20 0604 04/30/20 0042 05/01/20 0848 June 01, 2020 0433 06/01/20 0557 01-Jun-2020 0634  WBC 16.4* 14.7* 14.3*  --  17.5*  --   LATICACIDVEN  --   --   --  >11.0*  --  >11.0*    Liver Function Tests: Recent Labs  Lab 04/13/2020 1417 04/30/20 1700 05/01/20 0512 05/01/20 1600 06-01-20 0358  AST 37  --   --   --  >10,000*  ALT 28  --   --   --  4,611*  ALKPHOS 66  --   --   --  293*  BILITOT 0.7  --   --   --  3.2*  PROT 6.1*   --   --   --  4.7*  ALBUMIN 2.4* 1.7* 1.6* 1.6* 1.6*  1.6*   No results for input(s): LIPASE, AMYLASE in the last 168 hours. No results for input(s): AMMONIA in the last 168 hours.  ABG    Component Value Date/Time   PHART 7.135 (LL) 06/01/20 0800   PCO2ART 38.2 06/01/2020 0800   PO2ART 96 Jun 01, 2020 0800   HCO3 13.1 (L) 2020-06-01 0800   TCO2 14 (L) 06/01/20 0800   ACIDBASEDEF 15.0 (H) June 01, 2020 0800   O2SAT 96.0 06-01-20 0800     Coagulation Profile: Recent Labs  Lab 04/26/2020 1417 04/26/20 0206 06-01-20 0358  INR 2.2* 2.4* 3.0*    Cardiac Enzymes: Recent Labs  Lab 04/28/20 1721  CKTOTAL 124    HbA1C: Hemoglobin A1C  Date/Time Value Ref Range Status  01/28/2020 10:58 AM 7.3 (A) 4.0 - 5.6 % Final  08/23/2016 12:00 AM 7.6  Final  04/05/2016 12:00 AM 7.0  Final   Hgb A1c MFr Bld  Date/Time Value Ref Range Status  04/26/2020 04:23 AM 9.8 (H) 4.8 - 5.6 % Final    Comment:    (NOTE) Pre diabetes:          5.7%-6.4%  Diabetes:              >6.4%  Glycemic control for   <7.0% adults with diabetes   07/16/2019 07:51 AM 8.3 (H) <5.7 % of total Hgb Final    Comment:    For someone without known diabetes, a hemoglobin A1c value of 6.5% or greater indicates that they may have  diabetes and this should be confirmed with a follow-up  test. . For someone with known diabetes, a value <7% indicates  that their diabetes is well controlled and a value  greater than or equal to 7% indicates suboptimal  control. A1c targets should be individualized based on  duration of diabetes, age, comorbid conditions, and  other considerations. . Currently, no consensus exists regarding use of hemoglobin A1c for diagnosis of diabetes for children. .     CBG: Recent Labs  Lab 05/04/2020 0141 05-04-2020 0239 2020-05-04 0306 May 04, 2020 0456 04-May-2020 0715  GLUCAP 108* 65* 124* 62* 59*   BNP    Component Value Date/Time   BNP 1,653.5 (H) 04/17/2020 1417   CRITICAL  CARE Performed by: Spero Geralds  The patient is critically ill due to respiratory failure, encephalopathy, renal failure, septic shock.  Critical care was necessary to treat or prevent imminent or life-threatening deterioration.  Critical care was time spent personally by me on the following activities: development of treatment plan with patient and/or surrogate as well as nursing, discussions with consultants, evaluation of patient's response to treatment, examination of patient, obtaining history from patient or surrogate, ordering and performing treatments and interventions, ordering and review of laboratory studies, ordering and review of radiographic studies, pulse oximetry, re-evaluation of patient's condition and participation in multidisciplinary rounds.   Critical Care Time devoted to patient care services described in this note is 31 minutes. This time reflects time of care of this [place] . This critical care time does not reflect separately billable procedures or procedure time, teaching time or supervisory time of PA/NP/Med student/Med Resident etc but could involve care discussion time.       Leone Haven Pulmonary and Critical Care Medicine 05-04-20 2:12 PM  Pager: see AMION After hours pager: [phone]  If no response to pager , please call [phone] until 7pm After 7:00 pm call Elink  [phone]

## 2020-05-05 NOTE — Progress Notes (Signed)
[facility] Progress Note Patient Name: Robert Daniel DOB: 06/18/1940 MRN: [id number]   Date of Service  05-15-20  HPI/Events of Note  Multiple issues: 1. ABG on 50%/PRVC 30/TV 630/P 10 = 7.209/33.8/89 and 2. Hypoglycemia - Blood glucose = 65.   eICU Interventions  Plan: 1. NaHCO3 50 meq IV X 1.  2. Increase D5 NaHCO3 IV infusion to 125 mL/hour.  3. Repeat ABG at 5 AM.      Intervention Category Major Interventions: Acid-Base disturbance - evaluation and management;Respiratory failure - evaluation and management;Other:  Lamar Naef Cornelia Copa May 15, 2020, 3:21 AM

## 2020-05-05 NOTE — Progress Notes (Signed)
RT obtained ABG and reported critical results to RN and MD '@0805'$ . ABG as follows: pH 7.13, CO2 38.2, PO2 96, HCO3 13.1 and iCa 0.78. No new orders for RT at this time. RT will continue to monitor.

## 2020-05-05 NOTE — Progress Notes (Signed)
[facility] Progress Note Patient Name: Robert Daniel DOB: 02-15-1941 MRN: [id number]   Date of Service  05-30-2020  HPI/Events of Note  Hypotension - BP = 85/35 with MAP = 47. CVP = 11. Norepinephrine IV infusion at 60 mcg/min and Vasopressin IV infusion at shock dose.   eICU Interventions  Plan: 1. Phenylephrine IV infusion. Titrate to MAP >= 65.  2. Bolus with 0.9 NaCl 1 liter IV over 1 hour now.  3. Increase ceiling on Norepinephrine IV infusion to 80 mcg/min. 4. Send AM labs except ABG now.  5. Will ask PCCM ground team to evaluate the patient at bedside.      Intervention Category Major Interventions: Hypotension - evaluation and management  Robert Daniel 05-30-20, 3:29 AM

## 2020-05-05 NOTE — Progress Notes (Signed)
[facility] Progress Note  Name: Robert Daniel MRN: [id number] DOB: 12/28/1940  Chief Complaint:  weakness  Subjective:  He's had acute decline.  He's been more hypotensive and has been on levo, vaso, epi.  His code status was changed to DNR.  He had 3.4 liters UF over 2/25 with CRRT. Has been anuric.  Note per charting he was started on a bicarb gtt. Was transitioned to 2K for CRRT fluids then 0k for dialysate after refractory hyperkalemia.  Blood sugar had dropped, too. I spoke with his nurse and they have face timed with his sister who has decided to transition to comfort measures.   Review of systems:   Unable to obtain 2/2 intubated   -------------- Background on consult:  Robert Daniel is a 80 y.o. male with a history of HTN, CKD, DM, and atrial fibrillation who presented with weakness and slurred speech, and left facial droop.  Found to have right MCA CVA and transferred to [facility].  He is s/p thrombectomy.  Course has been complicated by renal failure with hypotension, pause vs asystole per charting with spontaneous ROSC per charting.  Nephrology is consulted for hyperkalemia and volume overload - has been on lasix gtt augmented by metolazone.  Yesterday with 1.4 liters UOP over 2/21. Spoke to his sister, Robert Daniel via phone.  We discussed risks/benefits/indications for dialysis and she does consent to proceed with dialysis and dialysis catheter placement.  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  Critical care ok with HD.  Per nursing it's been harder to get him to follow commands today.    Intake/Output Summary (Last 24 hours) at 05/05/2020 0825 Last data filed at 05-05-20 0700 Gross per 24 hour  Intake 4427.3 ml  Output 3567 ml  Net 860.3 ml    Vitals:  Vitals:   05/05/20 0645 05/05/20 0700 2020-05-05 0715 May 05, 2020 0751  BP:      Pulse:  (!) 165 73 (!) 117  Resp: (!) 35 (!) 35 (!) 35 (!) 35  Temp:      TempSrc:      SpO2:  (!) 79%  (!) 82%   Weight:      Height:         Physical Exam:  General: elderly male intubated  HEENT: NCAT Neck: trachea midline; increased neck circumference  Heart: tachy S1S2 no rub Lungs: coarse breath sounds on vent  Abdomen: soft/obese/nt with sedation Extremities: 2+ edema diffusely Skin: no rash on extremities exposed Neuro: no sedation currently running Psych no agitation currently Access RIJ nontunneled catheter  Medications reviewed    Labs:  BMP Latest Ref Rng & Units 05-05-2020 2020-05-05 05-05-20  Glucose 70 - 99 mg/dL - - 84  BUN 8 - 23 mg/dL - - 57(H)  Creatinine 0.61 - 1.24 mg/dL - - 2.67(H)  BUN/Creat Ratio 10 - 24 - - -  Sodium 135 - 145 mmol/L 137 135 136  Potassium 3.5 - 5.1 mmol/L 5.2(H) 5.3(H) 5.7(H)  Chloride 98 - 111 mmol/L - - 100  CO2 22 - 32 mmol/L - - 13(L)  Calcium 8.9 - 10.3 mg/dL - - 6.5(L)     Assessment/Plan:   # AKI - secondary to ischemic ATN and pre-renal insults - stroke, contrast, pause vs asystole reported.  Note presented with AKI from last baseline - note home meds include ACE inhibitor, ibuprofen, torsemide.  UA neg protein and > 50 RBC's noted. Up/cr ratio 1220 mg/g.  Renal US large kidneys no  hydro.  Had HD on 2/23 first tx after nontunneled catheter.    # Hyperkalemia  - refractory - setting of profound hypotension and acute worsening suggestive of acute process such as MI or gut ischemia  # Metabolic acidosis  - setting of AKI  # CVA - acute - s/p thrombectomy  # Staph aureus bacteremia - MSSA  # septic shock   # Anemia acute illness   # CKD stage 3 - previous baseline Cr 1.4 last fall - presented with AKI from that baseline  # UTI - staph aureus  # Hyperphoshatemia   -------- Spoke with Rn.  His family has elected to transition to comfort measures in light of acute decompensation overnight.  They are disconnecting CRRT.  Appreciate unit staff's support of his family and the patient during this difficult time    Please do not hesitate to reach out if I can be of assistance.   Claudia Desanctis, MD May 22, 2020  8:39 AM

## 2020-05-05 DEATH — deceased

## 2020-08-06 ENCOUNTER — Other Ambulatory Visit: Payer: PPO

## 2020-08-13 ENCOUNTER — Encounter: Payer: PPO | Admitting: Family Medicine

## 2021-03-30 ENCOUNTER — Ambulatory Visit: Payer: PPO
